# Patient Record
Sex: Female | Born: 1945
Health system: Southern US, Community
[De-identification: ages and names within clinical notes are randomized; demographics above are authoritative.]

## PROBLEM LIST (undated history)

## (undated) DIAGNOSIS — K219 Gastro-esophageal reflux disease without esophagitis: Secondary | ICD-10-CM

## (undated) DIAGNOSIS — M1990 Unspecified osteoarthritis, unspecified site: Secondary | ICD-10-CM

## (undated) DIAGNOSIS — Z96651 Presence of right artificial knee joint: Secondary | ICD-10-CM

## (undated) DIAGNOSIS — T7840XA Allergy, unspecified, initial encounter: Secondary | ICD-10-CM

## (undated) DIAGNOSIS — K759 Inflammatory liver disease, unspecified: Secondary | ICD-10-CM

## (undated) DIAGNOSIS — M8580 Other specified disorders of bone density and structure, unspecified site: Secondary | ICD-10-CM

## (undated) DIAGNOSIS — E559 Vitamin D deficiency, unspecified: Secondary | ICD-10-CM

## (undated) DIAGNOSIS — D499 Neoplasm of unspecified behavior of unspecified site: Secondary | ICD-10-CM

## (undated) DIAGNOSIS — G454 Transient global amnesia: Secondary | ICD-10-CM

## (undated) DIAGNOSIS — I8390 Asymptomatic varicose veins of unspecified lower extremity: Secondary | ICD-10-CM

## (undated) DIAGNOSIS — R413 Other amnesia: Secondary | ICD-10-CM

## (undated) DIAGNOSIS — R112 Nausea with vomiting, unspecified: Secondary | ICD-10-CM

## (undated) DIAGNOSIS — Z9889 Other specified postprocedural states: Secondary | ICD-10-CM

## (undated) DIAGNOSIS — I341 Nonrheumatic mitral (valve) prolapse: Secondary | ICD-10-CM

## (undated) DIAGNOSIS — I82409 Acute embolism and thrombosis of unspecified deep veins of unspecified lower extremity: Secondary | ICD-10-CM

## (undated) DIAGNOSIS — I671 Cerebral aneurysm, nonruptured: Secondary | ICD-10-CM

## (undated) DIAGNOSIS — E785 Hyperlipidemia, unspecified: Secondary | ICD-10-CM

## (undated) HISTORY — DX: Acute embolism and thrombosis of unspecified deep veins of unspecified lower extremity: I82.409

## (undated) HISTORY — DX: Presence of right artificial knee joint: Z96.651

## (undated) HISTORY — DX: Unspecified osteoarthritis, unspecified site: M19.90

## (undated) HISTORY — DX: Inflammatory liver disease, unspecified: K75.9

## (undated) HISTORY — PX: STAPEDECTOMY: SHX2435

## (undated) HISTORY — DX: Neoplasm of unspecified behavior of unspecified site: D49.9

## (undated) HISTORY — DX: Other specified disorders of bone density and structure, unspecified site: M85.80

## (undated) HISTORY — PX: COLONOSCOPY: SHX174

## (undated) HISTORY — DX: Hyperlipidemia, unspecified: E78.5

## (undated) HISTORY — DX: Other amnesia: R41.3

## (undated) HISTORY — PX: DILATION AND CURETTAGE OF UTERUS: SHX78

## (undated) HISTORY — DX: Allergy, unspecified, initial encounter: T78.40XA

## (undated) HISTORY — DX: Gastro-esophageal reflux disease without esophagitis: K21.9

## (undated) HISTORY — DX: Transient global amnesia: G45.4

## (undated) HISTORY — DX: Asymptomatic varicose veins of unspecified lower extremity: I83.90

## (undated) HISTORY — DX: Cerebral aneurysm, nonruptured: I67.1

## (undated) HISTORY — PX: OTHER SURGICAL HISTORY: SHX169

## (undated) HISTORY — PX: APPENDECTOMY: SHX54

## (undated) HISTORY — PX: TUBAL LIGATION: SHX77

## (undated) HISTORY — DX: Vitamin D deficiency, unspecified: E55.9

---
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
## 1997-12-03 ENCOUNTER — Other Ambulatory Visit: Admission: RE | Admit: 1997-12-03 | Discharge: 1997-12-03 | Payer: Self-pay | Admitting: Gynecology

## 1998-10-20 ENCOUNTER — Other Ambulatory Visit: Admission: RE | Admit: 1998-10-20 | Discharge: 1998-10-20 | Payer: Self-pay | Admitting: Gynecology

## 1999-11-03 ENCOUNTER — Other Ambulatory Visit: Admission: RE | Admit: 1999-11-03 | Discharge: 1999-11-03 | Payer: Self-pay | Admitting: Gynecology

## 2000-11-08 ENCOUNTER — Other Ambulatory Visit: Admission: RE | Admit: 2000-11-08 | Discharge: 2000-11-08 | Payer: Self-pay | Admitting: Gynecology

## 2001-11-13 ENCOUNTER — Other Ambulatory Visit: Admission: RE | Admit: 2001-11-13 | Discharge: 2001-11-13 | Payer: Self-pay | Admitting: Gynecology

## 2002-11-15 ENCOUNTER — Other Ambulatory Visit: Admission: RE | Admit: 2002-11-15 | Discharge: 2002-11-15 | Payer: Self-pay | Admitting: Gynecology

## 2003-08-04 ENCOUNTER — Emergency Department (HOSPITAL_COMMUNITY): Admission: EM | Admit: 2003-08-04 | Discharge: 2003-08-04 | Payer: Self-pay | Admitting: Emergency Medicine

## 2003-11-18 ENCOUNTER — Other Ambulatory Visit: Admission: RE | Admit: 2003-11-18 | Discharge: 2003-11-18 | Payer: Self-pay | Admitting: Gynecology

## 2003-12-06 ENCOUNTER — Encounter: Payer: Self-pay | Admitting: Internal Medicine

## 2004-03-12 ENCOUNTER — Ambulatory Visit: Payer: Self-pay | Admitting: Family Medicine

## 2004-07-08 ENCOUNTER — Ambulatory Visit: Payer: Self-pay | Admitting: Internal Medicine

## 2004-07-29 ENCOUNTER — Ambulatory Visit: Payer: Self-pay | Admitting: Internal Medicine

## 2004-08-21 ENCOUNTER — Ambulatory Visit: Payer: Self-pay | Admitting: Internal Medicine

## 2004-10-30 ENCOUNTER — Ambulatory Visit: Payer: Self-pay | Admitting: Internal Medicine

## 2004-11-06 ENCOUNTER — Ambulatory Visit: Payer: Self-pay | Admitting: Cardiology

## 2004-11-19 ENCOUNTER — Other Ambulatory Visit: Admission: RE | Admit: 2004-11-19 | Discharge: 2004-11-19 | Payer: Self-pay | Admitting: Gynecology

## 2004-11-23 ENCOUNTER — Ambulatory Visit: Payer: Self-pay | Admitting: Internal Medicine

## 2005-02-09 ENCOUNTER — Ambulatory Visit: Payer: Self-pay | Admitting: Internal Medicine

## 2005-07-23 ENCOUNTER — Ambulatory Visit: Payer: Self-pay | Admitting: Internal Medicine

## 2005-08-03 ENCOUNTER — Ambulatory Visit: Payer: Self-pay | Admitting: Internal Medicine

## 2005-09-10 ENCOUNTER — Ambulatory Visit: Payer: Self-pay | Admitting: Internal Medicine

## 2005-12-22 ENCOUNTER — Ambulatory Visit: Payer: Self-pay | Admitting: Internal Medicine

## 2006-03-25 ENCOUNTER — Ambulatory Visit: Payer: Self-pay | Admitting: Internal Medicine

## 2006-05-25 ENCOUNTER — Ambulatory Visit: Payer: Self-pay | Admitting: Internal Medicine

## 2006-05-25 LAB — CONVERTED CEMR LAB
ALT: 26 units/L (ref 0–40)
AST: 32 units/L (ref 0–37)
HDL: 62.6 mg/dL (ref >39.0)
Total CHOL/HDL Ratio: 2.7
Triglycerides: 46 mg/dL (ref 0–149)
VLDL: 9 mg/dL (ref 0–40)

## 2006-12-08 ENCOUNTER — Ambulatory Visit: Payer: Self-pay | Admitting: Internal Medicine

## 2006-12-29 ENCOUNTER — Telehealth (INDEPENDENT_AMBULATORY_CARE_PROVIDER_SITE_OTHER): Payer: Self-pay | Admitting: *Deleted

## 2006-12-30 ENCOUNTER — Ambulatory Visit: Payer: Self-pay | Admitting: Internal Medicine

## 2007-01-03 ENCOUNTER — Encounter: Payer: Self-pay | Admitting: Internal Medicine

## 2007-01-20 ENCOUNTER — Ambulatory Visit: Payer: Self-pay | Admitting: Internal Medicine

## 2007-01-23 ENCOUNTER — Telehealth (INDEPENDENT_AMBULATORY_CARE_PROVIDER_SITE_OTHER): Payer: Self-pay | Admitting: *Deleted

## 2007-01-24 ENCOUNTER — Encounter: Payer: Self-pay | Admitting: Internal Medicine

## 2007-01-24 ENCOUNTER — Telehealth (INDEPENDENT_AMBULATORY_CARE_PROVIDER_SITE_OTHER): Payer: Self-pay | Admitting: *Deleted

## 2007-01-25 ENCOUNTER — Encounter: Payer: Self-pay | Admitting: Internal Medicine

## 2007-02-03 ENCOUNTER — Encounter: Payer: Self-pay | Admitting: Internal Medicine

## 2007-02-28 ENCOUNTER — Ambulatory Visit: Payer: Self-pay | Admitting: Internal Medicine

## 2007-02-28 DIAGNOSIS — Z9889 Other specified postprocedural states: Secondary | ICD-10-CM | POA: Insufficient documentation

## 2007-02-28 DIAGNOSIS — Z9189 Other specified personal risk factors, not elsewhere classified: Secondary | ICD-10-CM | POA: Insufficient documentation

## 2007-02-28 DIAGNOSIS — F988 Other specified behavioral and emotional disorders with onset usually occurring in childhood and adolescence: Secondary | ICD-10-CM | POA: Insufficient documentation

## 2007-03-07 ENCOUNTER — Encounter (INDEPENDENT_AMBULATORY_CARE_PROVIDER_SITE_OTHER): Payer: Self-pay | Admitting: *Deleted

## 2007-03-07 LAB — CONVERTED CEMR LAB
AST: 21 units/L (ref 0–37)
Cholesterol: 231 mg/dL (ref 0–200)
Direct LDL: 151.6 mg/dL
Eosinophils Relative: 7.9 % — ABNORMAL HIGH (ref 0.0–5.0)
HDL: 56 mg/dL (ref >39.0)
Lymphocytes Relative: 34.2 % (ref 12.0–46.0)
MCHC: 33.8 g/dL (ref 30.0–36.0)
Monocytes Absolute: 0.3 10*3/uL (ref 0.2–0.7)
Monocytes Relative: 8.2 % (ref 3.0–11.0)
Neutrophils Relative %: 48.7 % (ref 43.0–77.0)
RDW: 12.5 % (ref 11.5–14.6)
TSH: 1.05 microintl units/mL (ref 0.35–5.50)

## 2007-03-10 ENCOUNTER — Encounter (INDEPENDENT_AMBULATORY_CARE_PROVIDER_SITE_OTHER): Payer: Self-pay | Admitting: *Deleted

## 2007-03-27 ENCOUNTER — Telehealth (INDEPENDENT_AMBULATORY_CARE_PROVIDER_SITE_OTHER): Payer: Self-pay | Admitting: *Deleted

## 2007-04-06 ENCOUNTER — Ambulatory Visit: Payer: Self-pay | Admitting: Internal Medicine

## 2007-05-05 ENCOUNTER — Ambulatory Visit: Payer: Self-pay | Admitting: Internal Medicine

## 2007-05-05 ENCOUNTER — Encounter: Payer: Self-pay | Admitting: Internal Medicine

## 2007-05-09 DIAGNOSIS — I499 Cardiac arrhythmia, unspecified: Secondary | ICD-10-CM | POA: Insufficient documentation

## 2007-05-09 DIAGNOSIS — I059 Rheumatic mitral valve disease, unspecified: Secondary | ICD-10-CM | POA: Insufficient documentation

## 2007-05-17 ENCOUNTER — Ambulatory Visit: Payer: Self-pay | Admitting: Internal Medicine

## 2007-05-18 ENCOUNTER — Ambulatory Visit: Payer: Self-pay | Admitting: Internal Medicine

## 2007-05-20 LAB — CONVERTED CEMR LAB
ALT: 22 units/L (ref 0–35)
AST: 25 units/L (ref 0–37)
Cholesterol: 187 mg/dL (ref 0–200)
VLDL: 20 mg/dL (ref 0–40)

## 2007-05-23 ENCOUNTER — Encounter (INDEPENDENT_AMBULATORY_CARE_PROVIDER_SITE_OTHER): Payer: Self-pay | Admitting: *Deleted

## 2007-07-25 ENCOUNTER — Ambulatory Visit: Payer: Self-pay | Admitting: Internal Medicine

## 2007-07-25 DIAGNOSIS — E785 Hyperlipidemia, unspecified: Secondary | ICD-10-CM | POA: Insufficient documentation

## 2007-07-25 LAB — CONVERTED CEMR LAB
Cholesterol, target level: 200 mg/dL
HDL goal, serum: 40 mg/dL
LDL Goal: 160 mg/dL

## 2007-08-10 ENCOUNTER — Encounter: Payer: Self-pay | Admitting: Internal Medicine

## 2007-08-21 ENCOUNTER — Telehealth (INDEPENDENT_AMBULATORY_CARE_PROVIDER_SITE_OTHER): Payer: Self-pay | Admitting: *Deleted

## 2007-09-01 ENCOUNTER — Telehealth (INDEPENDENT_AMBULATORY_CARE_PROVIDER_SITE_OTHER): Payer: Self-pay | Admitting: *Deleted

## 2007-10-09 ENCOUNTER — Ambulatory Visit: Payer: Self-pay | Admitting: Internal Medicine

## 2007-10-18 ENCOUNTER — Encounter: Admission: RE | Admit: 2007-10-18 | Discharge: 2007-11-08 | Payer: Self-pay | Admitting: Internal Medicine

## 2007-10-18 ENCOUNTER — Encounter: Payer: Self-pay | Admitting: Internal Medicine

## 2007-11-08 ENCOUNTER — Encounter: Payer: Self-pay | Admitting: Internal Medicine

## 2007-11-10 ENCOUNTER — Telehealth (INDEPENDENT_AMBULATORY_CARE_PROVIDER_SITE_OTHER): Payer: Self-pay | Admitting: *Deleted

## 2007-12-13 ENCOUNTER — Ambulatory Visit: Payer: Self-pay | Admitting: Internal Medicine

## 2007-12-14 ENCOUNTER — Encounter (INDEPENDENT_AMBULATORY_CARE_PROVIDER_SITE_OTHER): Payer: Self-pay | Admitting: *Deleted

## 2008-01-29 ENCOUNTER — Encounter (INDEPENDENT_AMBULATORY_CARE_PROVIDER_SITE_OTHER): Payer: Self-pay | Admitting: *Deleted

## 2008-01-31 ENCOUNTER — Ambulatory Visit: Payer: Self-pay | Admitting: Internal Medicine

## 2008-02-29 ENCOUNTER — Ambulatory Visit: Payer: Self-pay | Admitting: Internal Medicine

## 2008-02-29 DIAGNOSIS — M810 Age-related osteoporosis without current pathological fracture: Secondary | ICD-10-CM | POA: Insufficient documentation

## 2008-02-29 LAB — CONVERTED CEMR LAB
Cholesterol, target level: 200 mg/dL
HDL goal, serum: 50 mg/dL

## 2008-03-02 LAB — CONVERTED CEMR LAB: Vit D, 1,25-Dihydroxy: 47 (ref 30–89)

## 2008-03-04 ENCOUNTER — Encounter (INDEPENDENT_AMBULATORY_CARE_PROVIDER_SITE_OTHER): Payer: Self-pay | Admitting: *Deleted

## 2008-03-11 ENCOUNTER — Encounter (INDEPENDENT_AMBULATORY_CARE_PROVIDER_SITE_OTHER): Payer: Self-pay | Admitting: *Deleted

## 2008-03-11 ENCOUNTER — Ambulatory Visit: Payer: Self-pay | Admitting: Internal Medicine

## 2008-03-12 ENCOUNTER — Encounter (INDEPENDENT_AMBULATORY_CARE_PROVIDER_SITE_OTHER): Payer: Self-pay | Admitting: *Deleted

## 2008-03-12 LAB — CONVERTED CEMR LAB
ALT: 20 units/L (ref 0–35)
Alkaline Phosphatase: 56 units/L (ref 39–117)
Basophils Relative: 1.2 % (ref 0.0–3.0)
CO2: 28 meq/L (ref 19–32)
Calcium: 9.4 mg/dL (ref 8.4–10.5)
Creatinine, Ser: 1 mg/dL (ref 0.4–1.2)
GFR calc Af Amer: 72 mL/min
GFR calc non Af Amer: 60 mL/min
Glucose, Bld: 88 mg/dL (ref 70–99)
Lymphocytes Relative: 35.9 % (ref 12.0–46.0)
MCHC: 34.8 g/dL (ref 30.0–36.0)
Monocytes Absolute: 0.4 10*3/uL (ref 0.1–1.0)
Neutro Abs: 2.2 10*3/uL (ref 1.4–7.7)
Neutrophils Relative %: 47.6 % (ref 43.0–77.0)
TSH: 1.54 microintl units/mL (ref 0.35–5.50)
Total CHOL/HDL Ratio: 3.3
Total Protein: 7.1 g/dL (ref 6.0–8.3)
Triglycerides: 64 mg/dL (ref 0–149)
VLDL: 13 mg/dL (ref 0–40)

## 2008-03-22 ENCOUNTER — Encounter: Payer: Self-pay | Admitting: Internal Medicine

## 2008-03-25 ENCOUNTER — Encounter: Payer: Self-pay | Admitting: Internal Medicine

## 2008-07-15 ENCOUNTER — Ambulatory Visit: Payer: Self-pay | Admitting: Internal Medicine

## 2008-10-24 ENCOUNTER — Ambulatory Visit: Payer: Self-pay | Admitting: Internal Medicine

## 2008-10-24 DIAGNOSIS — H53469 Homonymous bilateral field defects, unspecified side: Secondary | ICD-10-CM | POA: Insufficient documentation

## 2008-10-24 DIAGNOSIS — I8390 Asymptomatic varicose veins of unspecified lower extremity: Secondary | ICD-10-CM | POA: Insufficient documentation

## 2008-10-24 DIAGNOSIS — M79609 Pain in unspecified limb: Secondary | ICD-10-CM | POA: Insufficient documentation

## 2008-10-25 ENCOUNTER — Ambulatory Visit: Payer: Self-pay

## 2008-10-25 ENCOUNTER — Encounter: Payer: Self-pay | Admitting: Internal Medicine

## 2009-03-05 ENCOUNTER — Ambulatory Visit: Payer: Self-pay | Admitting: Internal Medicine

## 2009-03-05 LAB — CONVERTED CEMR LAB
AST: 21 units/L (ref 0–37)
Albumin: 4.1 g/dL (ref 3.5–5.2)
Alkaline Phosphatase: 59 units/L (ref 39–117)
Basophils Absolute: 0 10*3/uL (ref 0.0–0.1)
Bilirubin, Direct: 0 mg/dL (ref 0.0–0.3)
CO2: 25 meq/L (ref 19–32)
Calcium: 8.9 mg/dL (ref 8.4–10.5)
Eosinophils Relative: 5.2 % — ABNORMAL HIGH (ref 0.0–5.0)
GFR calc non Af Amer: 67.04 mL/min (ref >60)
Glucose, Bld: 82 mg/dL (ref 70–99)
HDL: 63.1 mg/dL (ref >39.00)
LDL Cholesterol: 108 mg/dL — ABNORMAL HIGH (ref 0–99)
Lymphs Abs: 1.7 10*3/uL (ref 0.7–4.0)
Monocytes Relative: 7.6 % (ref 3.0–12.0)
Neutrophils Relative %: 52 % (ref 43.0–77.0)
Platelets: 249 10*3/uL (ref 150.0–400.0)
Potassium: 4.1 meq/L (ref 3.5–5.1)
RDW: 12.4 % (ref 11.5–14.6)
Sodium: 142 meq/L (ref 135–145)
Total CHOL/HDL Ratio: 3
Total Protein: 7.3 g/dL (ref 6.0–8.3)
Triglycerides: 88 mg/dL (ref 0.0–149.0)
VLDL: 17.6 mg/dL (ref 0.0–40.0)
WBC: 5 10*3/uL (ref 4.5–10.5)

## 2009-03-10 ENCOUNTER — Ambulatory Visit: Payer: Self-pay | Admitting: Internal Medicine

## 2009-03-31 ENCOUNTER — Telehealth (INDEPENDENT_AMBULATORY_CARE_PROVIDER_SITE_OTHER): Payer: Self-pay | Admitting: *Deleted

## 2009-07-03 ENCOUNTER — Ambulatory Visit: Payer: Self-pay | Admitting: Internal Medicine

## 2009-10-13 ENCOUNTER — Ambulatory Visit: Payer: Self-pay | Admitting: Internal Medicine

## 2009-10-13 DIAGNOSIS — M2550 Pain in unspecified joint: Secondary | ICD-10-CM | POA: Insufficient documentation

## 2009-10-14 LAB — CONVERTED CEMR LAB: Uric Acid, Serum: 4.3 mg/dL (ref 2.4–7.0)

## 2009-10-16 LAB — CONVERTED CEMR LAB: Rhuematoid fact SerPl-aCnc: <20 intl units/mL (ref 0–20)

## 2010-01-03 ENCOUNTER — Ambulatory Visit: Payer: Self-pay | Admitting: Family Medicine

## 2010-01-03 DIAGNOSIS — S139XXA Sprain of joints and ligaments of unspecified parts of neck, initial encounter: Secondary | ICD-10-CM | POA: Insufficient documentation

## 2010-02-17 ENCOUNTER — Telehealth (INDEPENDENT_AMBULATORY_CARE_PROVIDER_SITE_OTHER): Payer: Self-pay | Admitting: *Deleted

## 2010-03-27 ENCOUNTER — Ambulatory Visit: Payer: Self-pay | Admitting: Internal Medicine

## 2010-03-29 LAB — CONVERTED CEMR LAB
ALT: 19 units/L (ref 0–35)
Basophils Absolute: 0.1 10*3/uL (ref 0.0–0.1)
Basophils Relative: 0.9 % (ref 0.0–3.0)
Chloride: 107 meq/L (ref 96–112)
Eosinophils Relative: 7.2 % — ABNORMAL HIGH (ref 0.0–5.0)
GFR calc non Af Amer: 72.35 mL/min (ref >60)
HCT: 39.9 % (ref 36.0–46.0)
HDL: 64.2 mg/dL (ref >39.00)
Hemoglobin: 13.6 g/dL (ref 12.0–15.0)
Lymphocytes Relative: 28.7 % (ref 12.0–46.0)
Monocytes Relative: 8.4 % (ref 3.0–12.0)
Neutro Abs: 3.2 10*3/uL (ref 1.4–7.7)
Potassium: 4.5 meq/L (ref 3.5–5.1)
RBC: 4.22 M/uL (ref 3.87–5.11)
RDW: 13.1 % (ref 11.5–14.6)
Sodium: 144 meq/L (ref 135–145)
TSH: 1.81 microintl units/mL (ref 0.35–5.50)
Total Protein: 6.8 g/dL (ref 6.0–8.3)
Triglycerides: 57 mg/dL (ref 0.0–149.0)
VLDL: 11.4 mg/dL (ref 0.0–40.0)
WBC: 5.8 10*3/uL (ref 4.5–10.5)

## 2010-04-03 ENCOUNTER — Ambulatory Visit: Payer: Self-pay | Admitting: Internal Medicine

## 2010-04-03 ENCOUNTER — Encounter: Payer: Self-pay | Admitting: Internal Medicine

## 2010-04-03 DIAGNOSIS — M1710 Unilateral primary osteoarthritis, unspecified knee: Secondary | ICD-10-CM

## 2010-04-03 DIAGNOSIS — IMO0002 Reserved for concepts with insufficient information to code with codable children: Secondary | ICD-10-CM | POA: Insufficient documentation

## 2010-04-07 ENCOUNTER — Encounter (INDEPENDENT_AMBULATORY_CARE_PROVIDER_SITE_OTHER): Payer: Self-pay | Admitting: *Deleted

## 2010-04-07 ENCOUNTER — Ambulatory Visit: Payer: Self-pay | Admitting: Internal Medicine

## 2010-04-07 LAB — CONVERTED CEMR LAB: OCCULT 3: NEGATIVE
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
## 2010-05-26 NOTE — Assessment & Plan Note (Signed)
Summary: under chin is swollen//lch   Vital Signs:  Patient profile:   65 year old female Weight:      151.4 pounds Temp:     98.3 degrees F oral Pulse rate:   76 / minute Resp:     14 per minute BP sitting:   116 / 70  (left arm) Cuff size:   large  Vitals Entered By: Shonna Chock (July 03, 2009 12:21 PM) CC: 1.) Swollen area near left ear-? swollen gland   2.) Look @ middle finger on right hand Comments REVIEWED MED LIST, PATIENT AGREED DOSE AND INSTRUCTION CORRECT    CC:  1.) Swollen area near left ear-? swollen gland   2.) Look @ middle finger on right hand.  History of Present Illness: Constant sharp, 4 on 10 scale pain behind L  jaw line  since 07/02/2009. Rx: warm water topically.Swelling L face  > 1 week ago.  Allergies: 1)  ! * Adderall 2)  Codeine 3)  Indocin 4)  Bactrim 5)  Cipro 6)  Augmentin 7)  * Concerta  Review of Systems General:  Denies chills, fever, and sweats. ENT:  Denies decreased hearing, ear discharge, earache, nasal congestion, and sinus pressure; No frontal headache, facial pain or purulence. Allergy:  Complains of itching eyes; denies sneezing.  Physical Exam  General:  well-nourished,in no acute distress; alert,appropriate and cooperative throughout examination Ears:  External ear exam shows no significant lesions or deformities.  Otoscopic examination reveals clear canals, tympanic membranes are intact bilaterally without bulging, retraction, inflammation or discharge. Hearing is grossly normal bilaterally.No mastoid tenderness Nose:  External nasal examination shows no deformity or inflammation. Nasal mucosa are pink and moist without lesions or exudates. Mouth:  Oral mucosa and oropharynx without lesions or exudates.  Teeth in good repair.Osteoma hard palate. Crepitus L TMJ. Minimal pharyngeal erythema.  No parotid tenderness Neck:  Slight tenderness L submandibular area w/o distinct  LA  Skin:  Intact without suspicious lesions or  rashes Cervical Nodes:  No lymphadenopathy noted(See Neck) Axillary Nodes:  No palpable lymphadenopathy   Impression & Recommendations:  Problem # 1:  NECK PAIN, LEFT (ICD-723.1) L submandibular area; R/O early lymphadenitis The following medications were removed from the medication list:    Flexeril 5 Mg Tabs (Cyclobenzaprine hcl) .Marland Kitchen... 1 by mouth at bedtime as needed  Complete Medication List: 1)  Fosamax 70 Mg Tabs (Alendronate sodium) .Marland Kitchen.. 1 by mouth qweek 2)  Multivitamin  3)  Baby Asa  4)  Fish Oil  5)  Osteobiflex  .... Takes two daily 6)  Pravachol 40 Mg Tabs (Pravastatin sodium) .... 1/2 qhs 7)  Astelin 137 Mcg/spray Soln (Azelastine hcl) .Marland Kitchen.. 1-4 sprays q 12 hours 8)  Nasonex 50 Mcg/act Susp (Mometasone furoate) .... At bedtime 9)  Grape Seed  .Marland Kitchen.. 1 by mouth once daily 10)  Calcium Plus Vit D  .... 1 by mouth once daily 11)  Calcium/magnesium/zinc  .... 3 by mouth once daily 12)  Vitamin B-12 250 Mcg Tabs (Cyanocobalamin) .Marland Kitchen.. 1 by mouth once daily 13)  Cephalexin 500 Mg Caps (Cephalexin) .Marland Kitchen.. 1 two times a day  Patient Instructions: 1)  Monitor for signs of infection as discussed . Moist warm compresses 4X/ ady as needed .ASA as needed for painor fever. Prescriptions: CEPHALEXIN 500 MG CAPS (CEPHALEXIN) 1 two times a day  #14 x 0   Entered and Authorized by:   Marga Melnick MD   Signed by:   Marga Melnick MD on 07/03/2009  Method used:   Print then Give to Patient   RxID:   [id number]

## 2010-05-26 NOTE — Progress Notes (Signed)
Summary: orders for labs  Phone Note Call from Patient   Summary of Call: Made an appt for pt to have lab work done on  12-2 need orders to place on that day. Cpx is set up for 12-9 . Initial call taken by: Freddy Jaksch,  February 17, 2010 11:52 AM  Follow-up for Phone Call        272.4/995.20/v70.0  Lipid/Hep/BMP/TSH/CBCD/Stool Cards Follow-up by: Shonna Chock CMA,  February 17, 2010 1:22 PM  Additional Follow-up for Phone Call Additional follow up Details #1::        PLACE ON PT APPT. Additional Follow-up by: Freddy Jaksch,  February 18, 2010 9:53 AM

## 2010-05-26 NOTE — Assessment & Plan Note (Signed)
Summary: possible gout//lch   Vital Signs:  Patient profile:   65 year old female Weight:      150.6 pounds Temp:     98.7 degrees F oral Pulse rate:   76 / minute Resp:     17 per minute BP sitting:   118 / 68  (left arm) Cuff size:   large  Vitals Entered By: Shonna Chock (October 13, 2009 10:26 AM) CC: 1.) Pain and swelling in left foot/ right hand off/on x 3 months  2.) Discuss Pravachol/ Order for fasting labs (Near Future)  3.) Discuss Fosamax (Patient stopped x 2 weeks), Lower Extremity Joint pain Comments REVIEWED MED LIST, PATIENT AGREED DOSE AND INSTRUCTION CORRECT    CC:  1.) Pain and swelling in left foot/ right hand off/on x 3 months  2.) Discuss Pravachol/ Order for fasting labs (Near Future)  3.) Discuss Fosamax (Patient stopped x 2 weeks) and Lower Extremity Joint pain.  History of Present Illness:  Lower Extremity Pain      This is a 65 year old woman who presents with Lower Extremity  pain starting 6 weeks ago.  The patient denies swelling, redness, giving away, locking, popping, stiffness for >1 hr, decreased ROM, and weakness.  The pain is located in the LLE from the  knee to the ankle , "in the shin".  The pain began suddenly and with no injury.  The pain is described as sharp, constant, and occuring at rest, chiefly @ night. There has been no evaluation to date  The patient denies the following symptoms: fever, rash, photosensitivity, eye symptoms, diarrhea, and dysuria.  The LLE pain resolves after stopping Fosamax 2 weeks.  Rx: ASA helped her sleep. Now she has pain in L 2nd toe intermittently  & 4th R finger with am stiffness & swelling. Also intermittent R shoulder pain . Rx: ASA helps some. No FH of arthritis. Meds reviewed ; she is not on HCTZ. Her mother had pancreatic cancer ; a friend with pancreatic cancer has joint pain.  Allergies: 1)  ! * Adderall 2)  Codeine 3)  Indocin 4)  Bactrim 5)  Cipro 6)  Augmentin 7)  * Concerta  Review of Systems General:   Denies chills, fever, sweats, and weight loss. GI:  Denies abdominal pain, bloody stools, dark tarry stools, and indigestion. GU:  Denies discharge and hematuria. MS:  Denies joint redness, low back pain, mid back pain, muscle weakness, and thoracic pain. Derm:  Denies lesion(s) and rash. Neuro:  Denies brief paralysis, numbness, tingling, and weakness.  Physical Exam  General:  well-nourished,in no acute distress; alert,appropriate and cooperative throughout examination Eyes:  No corneal or conjunctival inflammation noted. Perrla.No icterus Abdomen:  Bowel sounds positive,abdomen soft and non-tender without masses, organomegaly or hernias noted. Pulses:  R and L radial,dorsalis pedis and posterior tibial pulses are full and equal bilaterally Extremities:  No clubbing, cyanosis, edema. Minor DIP changes  noted with normal full range of motion of all joints. Mild crepitus of knees. Varicose veins L ankle . Corn L 2nd toe medially. No skhin tenderness Neurologic:  alert & oriented X3, strength normal in all extremities, and DTRs symmetrical and normal.   Skin:  Intact without suspicious lesions or rashes. No jaundice Cervical Nodes:  No lymphadenopathy noted Axillary Nodes:  No palpable lymphadenopathy Psych:  memory intact for recent and remote, normally interactive, and good eye contact.     Impression & Recommendations:  Problem # 1:  ARTHRALGIA (ICD-719.40)  Multiple  joints  Orders: Venipuncture (64332) TLB-Rheumatoid Factor (RA) (95188-CZ) TLB-Sedimentation Rate (ESR) (85652-ESR) TLB-Uric Acid, Blood (84550-URIC) T-Finger(s) (73140TC) T-Toe(s) (73660TC)  Complete Medication List: 1)  Multivitamin  2)  Baby Asa  3)  Fish Oil  4)  Osteobiflex  .... Takes two daily 5)  Pravachol 40 Mg Tabs (Pravastatin sodium) .... 1/2 qhs 6)  Astelin 137 Mcg/spray Soln (Azelastine hcl) .Marland Kitchen.. 1-4 sprays q 12 hours 7)  Grape Seed  .Marland Kitchen.. 1 by mouth once daily 8)  Calcium Plus Vit D  .... 1 by  mouth once daily 9)  Calcium/magnesium/zinc  .... 3 by mouth once daily 10)  Vitamin B-12 250 Mcg Tabs (Cyanocobalamin) .Marland Kitchen.. 1 by mouth once daily 11)  Biotin 300 Mcg Tabs (Biotin) .Marland Kitchen.. 1 by mouth once daily  Patient Instructions: 1)  Arthritis Strength Tylenol at bedtime as needed for joint pain; ASA needs to be taken with food.
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
## 2010-05-28 NOTE — Letter (Signed)
Summary: Results Follow up Letter   at [place]  [address] [place], [place] [address]   Phone: [phone]  Fax: [phone]    04/07/2010 MRN: [id number]  Kim Mclaughlin [address] [place], [place]  [address]  Dear Kim Mclaughlin,  The following are the results of your recent test(s):  Test         Result    Pap Smear:        Normal _____  Not Normal _____ Comments: ______________________________________________________ Cholesterol: LDL(Bad cholesterol):         Your goal is less than:         HDL (Good cholesterol):       Your goal is more than: Comments:  ______________________________________________________ Mammogram:        Normal _____  Not Normal _____ Comments:  ___________________________________________________________________ Hemoccult:        Normal __X___  Not normal _______ Comments:    _____________________________________________________________________ Other Tests:    We routinely do not discuss normal results over the telephone.  If you desire a copy of the results, or you have any questions about this information we can discuss them at your next office visit.   Sincerely,

## 2010-07-10 ENCOUNTER — Encounter: Payer: Self-pay | Admitting: Internal Medicine

## 2010-07-10 ENCOUNTER — Ambulatory Visit (INDEPENDENT_AMBULATORY_CARE_PROVIDER_SITE_OTHER): Payer: Medicare Other | Admitting: Internal Medicine

## 2010-07-10 ENCOUNTER — Other Ambulatory Visit: Payer: Self-pay | Admitting: Internal Medicine

## 2010-07-10 DIAGNOSIS — R42 Dizziness and giddiness: Secondary | ICD-10-CM | POA: Insufficient documentation

## 2010-07-10 LAB — BASIC METABOLIC PANEL
BUN: 14 mg/dL (ref 6–23)
CO2: 27 mEq/L (ref 19–32)
Calcium: 10 mg/dL (ref 8.4–10.5)
Chloride: 103 mEq/L (ref 96–112)
Creatinine, Ser: 0.9 mg/dL (ref 0.4–1.2)
Glucose, Bld: 108 mg/dL — ABNORMAL HIGH (ref 70–99)

## 2010-07-10 LAB — CBC WITH DIFFERENTIAL/PLATELET
Basophils Absolute: 0 10*3/uL (ref 0.0–0.1)
Basophils Relative: 0.6 % (ref 0.0–3.0)
Eosinophils Absolute: 0.1 10*3/uL (ref 0.0–0.7)
MCHC: 34.2 g/dL (ref 30.0–36.0)
MCV: 93.1 fl (ref 78.0–100.0)
Monocytes Absolute: 0.4 10*3/uL (ref 0.1–1.0)
Neutrophils Relative %: 61.9 % (ref 43.0–77.0)
Platelets: 321 10*3/uL (ref 150.0–400.0)
RDW: 13.5 % (ref 11.5–14.6)

## 2010-07-14 NOTE — Assessment & Plan Note (Signed)
Summary: Vertigo/cdj   Vital Signs:  Patient profile:   65 year old female Height:      63.25 inches Weight:      151 pounds O2 Sat:      99 % on Room air Pulse rate:   84 / minute Pulse (ortho):   84 / minute BP standing:   110 / 80  Vitals Entered By: Doristine Devoid CMA (July 10, 2010 12:15 PM)  O2 Flow:  Room air CC: Dizziness this morning has had vertigo in the past , Syncope   Serial Vital Signs/Assessments:  Time      Position  BP       Pulse  Resp  Temp     By           Lying RA  110/74   70                    Chemira Jones CMA           Sitting   112/76   74                    Chemira Jones CMA           Standing  110/80   84                    Chemira Jones CMA   CC:  Dizziness this morning has had vertigo in the past  and Syncope.  History of Present Illness:    Onset upon arising from bed @ 6 am ; "I bounced from wall to wall".No BPV symptoms . PMH of same 28. She  reports lightheadedness, but denies loss of consciousness, premonitory symptoms, palpitations, chest pain, and incontinence.  Associated symptoms include nausea and  diaphoresis, "cold sweat".  The patient denies the following symptoms: headache, abdominal discomfort, feeling warm, pallor, focal weakness, and blurred vision.  The patient reports the following precipitating factors: change in position , specifically  standing upright.    Current Medications (verified): 1)  Multivitamin 2)  Baby Asa 3)  Fish Oil 4)  Osteobiflex .... Takes Two Daily 5)  Astelin 137 Mcg/spray  Soln (Azelastine Hcl) .Marland Kitchen.. 1-4 Sprays Q 12 Hours 6)  Calcium Plus Vit D .... 1 By Mouth Once Daily 7)  Skelaxin 800 Mg Tabs (Metaxalone) .... Take 1 Tablet By Mouth Three Times A Day As Needed For Muscle Spasm. 8)  Biotin .Marland Kitchen.. 1 By Mouth Two Times A Day 9)  Vanos 0.1 % Crea (Fluocinonide) .... Apply As Directed Two Times A Day As Needed 10)  Tramadol Hcl 50 Mg Tabs (Tramadol Hcl) .... 1/2 - 1 Every 6 Hrs As Needed For  Pain  Allergies (verified): 1)  ! * Adderall 2)  ! Pravastatin Sodium (Pravastatin Sodium) 3)  Codeine 4)  Indocin 5)  Bactrim 6)  Cipro 7)  Augmentin 8)  * Concerta  Review of Systems General:  Denies chills and fever. Eyes:  Denies double vision and vision loss-both eyes. ENT:  Denies decreased hearing and ringing in ears. Neuro:  Denies sensation of room spinning.  Physical Exam  General:  well-nourished,in no acute distress; alert,appropriate and cooperative throughout examination Eyes:  No corneal or conjunctival inflammation noted. EOMI. Perrla. Field of  Vision grossly normal. Ears:  External ear exam shows no significant lesions or deformities.  Otoscopic examination reveals clear canals, tympanic membranes are intact bilaterally without bulging, retraction, inflammation or discharge. Hearing is grossly  normal bilaterally. Nose:  External nasal examination shows no deformity or inflammation. Nasal mucosa are pink and moist without lesions or exudates. Slight septal deviation Mouth:  Oral mucosa and oropharynx without lesions or exudates.  Tongue not deviated Lungs:  Normal respiratory effort, chest expands symmetrically. Lungs are clear to auscultation, no crackles or wheezes. Heart:  Normal rate and regular rhythm. S1 and S2 normal without gallop, murmur, click, rub.S4 Pulses:  R and L carotid,radial  pulses are full and equal bilaterally Extremities:  No clubbing, cyanosis, edema. No tremor  Neurologic:  alert & oriented X3, cranial nerves II-XII intact, strength normal in all extremities, sensation intact to light touch, gait normal, DTRs symmetrical and normal, finger-to-nose normal, heel-to-shin normal, and Romberg negative.   Skin:  Intact without suspicious lesions or rashes; slightly damp Cervical Nodes:  No lymphadenopathy noted Axillary Nodes:  No palpable lymphadenopathy Psych:  memory intact for recent and remote, normally interactive, good eye contact, and not  anxious appearing.     Impression & Recommendations:  Problem # 1:  DIZZINESS (ICD-780.4)  w/o vertigo ; no BPV  Orders: TLB-BMP (Basic Metabolic Panel-BMET) (80048-METABOL) TLB-CBC Platelet - w/Differential (85025-CBCD) Prescription Created Electronically [phone])  Her updated medication list for this problem includes:    Meclizine Hcl 25 Mg Tabs (Meclizine hcl) .Marland Kitchen... 1/2 -1 pill every 6 -8 hrs as needed  Complete Medication List: 1)  Multivitamin  2)  Baby Asa  3)  Fish Oil  4)  Osteobiflex  .... Takes two daily 5)  Astelin 137 Mcg/spray Soln (Azelastine hcl) .Marland Kitchen.. 1-4 sprays q 12 hours 6)  Calcium Plus Vit D  .... 1 by mouth once daily 7)  Skelaxin 800 Mg Tabs (Metaxalone) .... Take 1 tablet by mouth three times a day as needed for muscle spasm. 8)  Biotin  .Marland Kitchen.. 1 by mouth two times a day 9)  Vanos 0.1 % Crea (Fluocinonide) .... Apply as directed two times a day as needed 10)  Tramadol Hcl 50 Mg Tabs (Tramadol hcl) .... 1/2 - 1 every 6 hrs as needed for pain 11)  Meclizine Hcl 25 Mg Tabs (Meclizine hcl) .... 1/2 -1 pill every 6 -8 hrs as needed  Patient Instructions: 1)  Isometric  exercises before standing as discussed. Prescriptions: MECLIZINE HCL 25 MG TABS (MECLIZINE HCL) 1/2 -1 pill every 6 -8 hrs as needed  #20 x 0   Entered and Authorized by:   Marga Melnick MD   Signed by:   Marga Melnick MD on 07/10/2010   Method used:   Electronically to        [facility] [phone]* (retail)       [address]       [place], [place]  [address]       Ph: [phone]       Fax: [phone]   RxID:   [id number]    Orders Added: 1)  Est. Patient Level III [32951] 2)  TLB-BMP (Basic Metabolic Panel-BMET) [80048-METABOL] 3)  TLB-CBC Platelet - w/Differential [85025-CBCD] 4)  Prescription Created Electronically [phone]

## 2010-08-12 ENCOUNTER — Telehealth: Payer: Self-pay | Admitting: Internal Medicine

## 2010-08-12 NOTE — Telephone Encounter (Signed)
Patient fell yesterday around lunch and landed on her left wrist---doesn't think it is broken, but would like someone to look at it---first available appt is Dr Alwyn Ren on  Friday    Please call her to advise

## 2010-08-12 NOTE — Telephone Encounter (Signed)
Left msg for return call

## 2010-09-08 NOTE — Assessment & Plan Note (Signed)
[facility]                         GASTROENTEROLOGY OFFICE NOTE   Kim Mclaughlin, Kim Mclaughlin                   MRN:          [id number]  DATE:04/06/2007                            DOB:          March 06, 1946    Kim Mclaughlin is a very nice 65 year old white female who has strong  family history of cancer.  In her family, her father had colon cancer  and died at age of 59, his mother also had colon cancer, paternal aunt  had lung cancer, paternal sister had breast cancer and her paternal  aunt's sister had colon cancer as well, her maternal grandfather had  prostate cancer and sister had ovarian cancer.  We did a colonoscopy on  Kim Mclaughlin in July in 1995 and 1991 which were normal and the last  one in July 2003, which again showed a normal exam.  She is due for  repeat colonoscopy.  She has occasional constipation.  She denies  abdominal pain or rectal bleeding.   MEDICATIONS:  1. Multiple vitamins.  2. Osteo-BiFlex.  3. Fish oil.  4. Aspirin 81 mg p.o. daily.  5. Pravastatin.  6. Calcium supplements.   PAST HISTORY:  1. Heart arrhythmia.  2. Mitral valve prolapse.   OPERATIONS:  Appendectomy in 1971.   FAMILY HISTORY:  As mentioned in the history of present illness.   SOCIAL HISTORY:  Married with 3 children.  She is retired Chiropractor.  She does not smoke and does not drink alcohol.   REVIEW OF SYSTEMS:  Positive for eyeglasses, allergies, heart rhythm  problems, new headaches.   PHYSICAL EXAMINATION:  Blood pressure 114/60, pulse 80 and weight 146.6  pounds.  She was alert, oriented and in no distress.  LUNGS:  Clear to auscultation.  COR:  With normal S1, normal S2.  ABDOMEN:  Soft, mildly protuberant with normoactive bowel sounds, no  tenderness, liver edge at costal margin.  RECTAL:  With somewhat hard but Hemoccult negative stool.  EXTREMITIES:  A 65 year old white female with strong family history of  colon  cancer and occasional constipation.  She is due for recall  colonoscopy.   PLAN:  1. Increase fiber to high fiber diet.  2. Add additional fiber such as Benefiber 1 tablespoon daily.  3. Colonoscopy scheduled using routine colonoscopy prep.     Kim Mclaughlin. Juanda Chance, MD  Electronically Signed    DMB/MedQ  DD: 04/06/2007  DT: 04/07/2007  Job #: [id number]   cc:   Titus Dubin. Alwyn Ren, MD,FACP,FCCP

## 2010-12-22 ENCOUNTER — Other Ambulatory Visit: Payer: Self-pay | Admitting: Gynecology

## 2010-12-22 ENCOUNTER — Other Ambulatory Visit: Payer: Self-pay | Admitting: *Deleted

## 2010-12-22 DIAGNOSIS — R234 Changes in skin texture: Secondary | ICD-10-CM

## 2010-12-22 DIAGNOSIS — R928 Other abnormal and inconclusive findings on diagnostic imaging of breast: Secondary | ICD-10-CM

## 2010-12-22 NOTE — Telephone Encounter (Signed)
OK X1 

## 2010-12-22 NOTE — Telephone Encounter (Signed)
Pt called would like rx written for Voltaren had med prescribed on 10/2007 for knee pain. [facility]

## 2010-12-23 MED ORDER — DICLOFENAC SODIUM 1 % TD GEL: 1.0000 "application " | Freq: Two times a day (BID) | TRANSDERMAL | Status: DC | PRN

## 2010-12-25 ENCOUNTER — Other Ambulatory Visit: Payer: Self-pay | Admitting: Gynecology

## 2010-12-25 ENCOUNTER — Ambulatory Visit
Admission: RE | Admit: 2010-12-25 | Discharge: 2010-12-25 | Disposition: A | Discharge status: Home / Self Care | Payer: Medicare Other | Source: Ambulatory Visit | Attending: Gynecology | Admitting: Gynecology

## 2010-12-25 DIAGNOSIS — R234 Changes in skin texture: Secondary | ICD-10-CM

## 2010-12-25 DIAGNOSIS — R928 Other abnormal and inconclusive findings on diagnostic imaging of breast: Secondary | ICD-10-CM

## 2011-01-28 ENCOUNTER — Telehealth: Payer: Self-pay | Admitting: Internal Medicine

## 2011-01-28 MED ORDER — TRAMADOL HCL 50 MG PO TABS: ORAL_TABLET | ORAL | Status: DC

## 2011-01-28 NOTE — Telephone Encounter (Signed)
RX sent

## 2011-01-28 NOTE — Telephone Encounter (Signed)
[facility] [address]

## 2011-04-28 ENCOUNTER — Telehealth: Payer: Self-pay

## 2011-04-28 NOTE — Telephone Encounter (Signed)
Call from patient stating she was sick and she wanted Tamiflu. Discussed with patient and she stated she has had a low grade temp, chills, cough and headache. She asked could he get something called in to the pharmacy. I advised we normally do call in Rx without patient being seen. The doctor has to review symptoms to make a diagnosis. She voiced understanding.Marland KitchenMarland KitchenApt scheduled with [facility] tomorrow    KP

## 2011-04-29 ENCOUNTER — Ambulatory Visit (INDEPENDENT_AMBULATORY_CARE_PROVIDER_SITE_OTHER): Payer: Medicare Other | Admitting: Internal Medicine

## 2011-04-29 VITALS — BP 118/78 | HR 79 | Temp 100.8°F | Ht 63.25 in | Wt 148.4 lb

## 2011-04-29 DIAGNOSIS — R110 Nausea: Secondary | ICD-10-CM

## 2011-04-29 DIAGNOSIS — J209 Acute bronchitis, unspecified: Secondary | ICD-10-CM

## 2011-04-29 DIAGNOSIS — Z8711 Personal history of peptic ulcer disease: Secondary | ICD-10-CM

## 2011-04-29 DIAGNOSIS — Z8719 Personal history of other diseases of the digestive system: Secondary | ICD-10-CM

## 2011-04-29 MED ORDER — AZITHROMYCIN 250 MG PO TABS
ORAL_TABLET | ORAL | Status: AC
Start: 2011-04-29 — End: 2011-05-04

## 2011-04-29 MED ORDER — RANITIDINE HCL 150 MG PO CAPS: 150.0000 mg | ORAL_CAPSULE | Freq: Two times a day (BID) | ORAL | Status: DC

## 2011-04-29 MED ORDER — BENZONATATE 200 MG PO CAPS: 200.0000 mg | ORAL_CAPSULE | Freq: Three times a day (TID) | ORAL | Status: DC | PRN

## 2011-04-29 NOTE — Progress Notes (Signed)
  Subjective:    Patient ID: Kim Mclaughlin, female    DOB: 05-14-1945, 66 y.o.   MRN: [id number]  HPI Respiratory tract infection Onset/symptoms:04/27/11 as chills  Exposures (illness/environmental/extrinsic):no Progression of symptoms:to deep cough Treatments/response:ASA, Robitussin DM w/o benefit Present symptoms: Fever/chills/sweats:low grade fever Frontal headache:no Facial pain:no Nasal purulence:no Sore throat:minor Dental pain:no Lymphadenopathy:no Wheezing/shortness of breath:no Cough/sputum/hemoptysis:NP Associated extrinsic/allergic symptoms:itchy eyes/ sneezing:no Past medical history: Seasonal allergies; yes/asthma:no Smoking history:in HS No Flu shot in 2012           Review of Systems with the stresses for the holiday she  had some epigastric pain which responded to Mylanta. HEENT she has a past history of ulcers. She has not had melena or  rectal bleeding.     Objective:   Physical Exam General appearance:good health ;well nourished; no acute distress or increased work of breathing is present.  No  lymphadenopathy about the head, neck, or axilla noted.   Eyes: No conjunctival inflammation or lid edema is present. Vision normal with lenses; EOMI . No scleral icterus  Ears:  External ear exam shows no significant lesions or deformities.  Otoscopic examination reveals clear canals, tympanic membranes are intact bilaterally without bulging, retraction, inflammation or discharge.  Nose:  External nasal examination shows no deformity or inflammation. Nasal mucosa are pink and moist without lesions or exudates. No septal dislocation or deviation.No obstruction to airflow.   Oral exam: Dental hygiene is good; lips and gums are healthy appearing.There is no oropharyngeal erythema or exudate noted.   Neck:  No deformities,  masses, or tenderness noted.   Supple with full range of motion without pain.   Heart:  Normal rate and regular rhythm. S1 and S2 normal  without gallop, murmur, click, rub or other extra sounds.   Lungs:Chest clear to auscultation; no wheezes, rhonchi,rales ,or rubs present.No increased work of breathing.   Bowel sounds are normal. Abdomen is soft and nontender with no organomegaly, hernias  or masses.   Extremities:  No cyanosis, edema, or clubbing  noted    Skin: Warm & dry           Assessment & Plan:  #1 bronchitis, acute. No symptoms to suggest rhinosinusitis.  #2 epigastric discomfort which responded to Mylanta. Probable significant reflux Plan: See orders and recommendations.

## 2011-04-29 NOTE — Patient Instructions (Signed)
The triggers for dyspepsia or "heart burn"  include stress; the "aspirin family" ; alcohol; peppermint; and caffeine (coffee, tea, cola, and chocolate). The aspirin family would include aspirin and the nonsteroidal agents such as ibuprofen &  Naproxen. Tylenol would not cause reflux. If having dyspepsia ; food & drink should be avoided for @ least 2 hours before going to bed.  

## 2011-05-05 ENCOUNTER — Telehealth: Payer: Self-pay | Admitting: *Deleted

## 2011-05-05 DIAGNOSIS — J209 Acute bronchitis, unspecified: Secondary | ICD-10-CM

## 2011-05-05 MED ORDER — BENZONATATE 200 MG PO CAPS: 200.0000 mg | ORAL_CAPSULE | Freq: Three times a day (TID) | ORAL | Status: AC | PRN

## 2011-05-05 NOTE — Telephone Encounter (Signed)
Spoke with patient, patient aware rx sent in. Patient states she needs to get well soon b/c her husband has an upcoming surgery and she will have to care for him. Patient would like to go forward with chest xray and schedule appointment for Friday

## 2011-05-05 NOTE — Telephone Encounter (Signed)
Pt reports she was seen in office last Thurs 01.03.12 and prescribed Zpak & cough medication; c/o still having back pain, request to know if she can get renewal on meds or need OV.? Please advise.

## 2011-05-05 NOTE — Telephone Encounter (Signed)
Renew Tessalon Perls. She should have an x-ray and be seen if she continues to have fever or purulent secretions.

## 2011-05-06 ENCOUNTER — Ambulatory Visit (INDEPENDENT_AMBULATORY_CARE_PROVIDER_SITE_OTHER)
Admission: RE | Admit: 2011-05-06 | Discharge: 2011-05-06 | Disposition: A | Discharge status: Home / Self Care | Payer: Medicare Other | Source: Ambulatory Visit | Attending: Internal Medicine | Admitting: Internal Medicine

## 2011-05-06 DIAGNOSIS — J209 Acute bronchitis, unspecified: Secondary | ICD-10-CM

## 2011-05-07 ENCOUNTER — Ambulatory Visit (INDEPENDENT_AMBULATORY_CARE_PROVIDER_SITE_OTHER): Payer: Medicare Other | Admitting: Internal Medicine

## 2011-05-07 VITALS — BP 118/76 | HR 111 | Temp 97.7°F | Ht 63.5 in | Wt 146.0 lb

## 2011-05-07 DIAGNOSIS — J40 Bronchitis, not specified as acute or chronic: Secondary | ICD-10-CM

## 2011-05-07 NOTE — Progress Notes (Signed)
  Subjective:    Patient ID: Kim Mclaughlin, female    DOB: Feb 06, 1946, 66 y.o.   MRN: [id number]  HPI Acute visit Patient of Dr. Alwyn Ren with history of hyperlipidemia, osteopenia was seen recently with URI symptoms and epigastric pain. Was prescribed a Z-Pak and Tessalon Perles. Developed diarrhea one time which resolved. She also developed mid- thoracic pain for which an x-ray was done, x-ray was negative. She is here for followup, overall feels better but not completely well, still having some cough    Review of Systems No fever or chills No nausea or vomiting No shortness of breath or lower extremity edema.    Objective:   Physical Exam  Constitutional: She appears well-developed and well-nourished. No distress.  HENT:  Head: Normocephalic and atraumatic.       TMs slightly bulged but not read, face symmetric, nontender to palpation. Nose not congested, throat normal.   Cardiovascular: Normal rate, regular rhythm and normal heart sounds.   No murmur heard. Pulmonary/Chest: Effort normal and breath sounds normal. No respiratory distress. She has no wheezes.  Musculoskeletal:       Not tender to palpation at the thoracic spine or lumbar spine  Skin: She is not diaphoretic.       Assessment & Plan:  Bronchitis: patient is improving, back pain likely musculoskeletal and related to cough, x-ray negative.  I think she will improve with more time, see instructions. If the back pain continue, we may need to order thoracic spine x-rays on rib x-rays

## 2011-05-07 NOTE — Patient Instructions (Signed)
Continue with tessalon perls as needed for cough mucinex DM is also a great medicine  Call if not back to normal in 10 days

## 2011-05-08 ENCOUNTER — Encounter: Payer: Self-pay | Admitting: Internal Medicine

## 2011-06-04 ENCOUNTER — Ambulatory Visit (INDEPENDENT_AMBULATORY_CARE_PROVIDER_SITE_OTHER): Payer: Medicare Other | Admitting: Internal Medicine

## 2011-06-04 ENCOUNTER — Encounter: Payer: Self-pay | Admitting: Internal Medicine

## 2011-06-04 VITALS — BP 98/62 | HR 73 | Temp 98.6°F | Resp 12 | Ht 63.5 in | Wt 148.2 lb

## 2011-06-04 DIAGNOSIS — E785 Hyperlipidemia, unspecified: Secondary | ICD-10-CM

## 2011-06-04 DIAGNOSIS — Z0000 Encounter for general adult medical examination without abnormal findings: Secondary | ICD-10-CM

## 2011-06-04 DIAGNOSIS — M949 Disorder of cartilage, unspecified: Secondary | ICD-10-CM

## 2011-06-04 DIAGNOSIS — M899 Disorder of bone, unspecified: Secondary | ICD-10-CM

## 2011-06-04 NOTE — Assessment & Plan Note (Signed)
She was previously on cholesterol medicine but stopped this wishing to try  therapeutic life changes. Her last LDL was 161 on 03/27/10. Advanced cholesterol testing will be performed to assess risk and options

## 2011-06-04 NOTE — Progress Notes (Signed)
Subjective:    Patient ID: Kim Mclaughlin, female    DOB: 1945-06-17, 66 y.o.   MRN: [id number]  HPI Medicare Wellness Visit:  The following psychosocial & medical history were reviewed as required by Medicare.   Social history: caffeine:2.5-3 cups / day , alcohol:  no ,  tobacco use : quit 1965  & exercise : walking < 30 min 1X/ week.   Home & personal  safety / fall risk:no issues, activities of daily living: no limitations , seatbelt use : yes , and smoke alarm employment : yes.  Power of Attorney/Living Will status : in place  Vision ( as recorded per Nurse) & Hearing  evaluation :  See exam. Orientation :oriented X 3 , memory & recall :good, math testing: good,and mood & affect : normal . Depression / anxiety: denied Travel history :never , immunization status :multiple shots needed , transfusion history: no, and preventive health surveillance ( colonoscopies, BMD , etc as per protocol/ [facility]): colonoscopy up to date, Dental care: seen every 6 mos . Chart reviewed &  Updated. Active issues reviewed & addressed.       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 She denies any symptoms of rhinosinusitis, purulent sputum, fever, chills, or sweats. She is not on an ACE inhibitor. She has no history of asthma or reactive airways disease.  She denies any symptoms of reflux such as dysphagia, dyspepsia, abdominal pain, rectal bleeding or melena     Objective:   Physical Exam Gen.: Healthy and well-nourished in appearance. Alert, appropriate and cooperative throughout exam. Head: Normocephalic without obvious abnormalities Eyes: No corneal or conjunctival inflammation noted. Pupils equal round reactive to light and accommodation.  Extraocular motion intact. Vision grossly normal with lenses. Ears: External  ear exam reveals no significant lesions or deformities. Canals clear .TMs normal. Hearing is grossly  normal bilaterally. Nose: External nasal exam reveals no deformity or inflammation. Nasal mucosa are pink and moist. No lesions or exudates noted.  Mouth: Oral mucosa and oropharynx reveal no lesions or exudates. Teeth in good repair. Neck: No deformities, masses, or tenderness noted. Range of motion & Thyroid normal Lungs: Normal respiratory effort; chest expands symmetrically. Lungs are clear to auscultation without rales, wheezes, or increased work of breathing. Heart: Normal rate and rhythm. Normal S1 and S2. No gallop, click, or rub. S4 with slurring at  LSB ;no murmur. Abdomen: Bowel sounds normal; abdomen soft and nontender. No masses, organomegaly or hernias noted. Genitalia: Dr Greta Doom .                                                                                   Musculoskeletal/extremities: No deformity or scoliosis noted of  the thoracic or lumbar spine. No clubbing, cyanosis, edema, or deformity noted. Range of motion  normal .Tone & strength  normal.Joints normal. Nail health  good. Vascular: Carotid, radial artery, dorsalis pedis and  posterior tibial pulses are full and equal. No bruits present. Neurologic: Alert and oriented x3. Deep tendon reflexes symmetrical and normal.          Skin: Intact without suspicious lesions or  rashes. Lymph: No cervical, axillary  lymphadenopathy present. Psych: Mood and affect are normal. Normally interactive                                                                                         Assessment & Plan:  #1 Medicare Wellness Exam; criteria met ; data entered #2 Problem List reviewed ; Assessment/ Recommendations made  #3 cough; this is probably mild reactive airways disease following bronchitis last month. Samples of Qvar will be used. She'll be asked to sip room  temperature fluids with each cough. Plan: see Orders

## 2011-06-04 NOTE — Patient Instructions (Signed)
Preventive Health Care: Exercise  30-45  minutes a day, 3-4 days a week. Walking is especially valuable in preventing Osteoporosis. Eat a low-fat diet with lots of fruits and vegetables, up to 7-9 servings per day. Consume less than 30 grams of sugar per day from foods & drinks with High Fructose Corn Syrup as # 1,2,3 or #4 on label. Please  schedule fasting Labs : BMET,hepatic panel, CBC & dif, TSH,vit D level, NMR Lipoprofile .  PLEASE BRING THESE INSTRUCTIONS TO FOLLOW UP  LAB APPOINTMENT.This will guarantee correct labs are drawn, eliminating need for repeat blood sampling ( needle sticks ! ). Diagnoses /Codes: 530.81,272.4,995.20,733.90,V17.3. QVAR 40  one-2  inhalations every 12 hours; gargle and spit after use  For cough

## 2011-06-08 ENCOUNTER — Other Ambulatory Visit: Payer: Self-pay | Admitting: Internal Medicine

## 2011-06-08 DIAGNOSIS — Z8249 Family history of ischemic heart disease and other diseases of the circulatory system: Secondary | ICD-10-CM

## 2011-06-08 DIAGNOSIS — K219 Gastro-esophageal reflux disease without esophagitis: Secondary | ICD-10-CM

## 2011-06-08 DIAGNOSIS — E785 Hyperlipidemia, unspecified: Secondary | ICD-10-CM

## 2011-06-08 DIAGNOSIS — T887XXA Unspecified adverse effect of drug or medicament, initial encounter: Secondary | ICD-10-CM

## 2011-06-08 DIAGNOSIS — M899 Disorder of bone, unspecified: Secondary | ICD-10-CM

## 2011-06-09 ENCOUNTER — Other Ambulatory Visit (INDEPENDENT_AMBULATORY_CARE_PROVIDER_SITE_OTHER): Payer: Medicare Other

## 2011-06-09 DIAGNOSIS — M949 Disorder of cartilage, unspecified: Secondary | ICD-10-CM

## 2011-06-09 DIAGNOSIS — E785 Hyperlipidemia, unspecified: Secondary | ICD-10-CM

## 2011-06-09 DIAGNOSIS — M899 Disorder of bone, unspecified: Secondary | ICD-10-CM

## 2011-06-09 DIAGNOSIS — Z8249 Family history of ischemic heart disease and other diseases of the circulatory system: Secondary | ICD-10-CM

## 2011-06-09 DIAGNOSIS — K219 Gastro-esophageal reflux disease without esophagitis: Secondary | ICD-10-CM

## 2011-06-09 DIAGNOSIS — T887XXA Unspecified adverse effect of drug or medicament, initial encounter: Secondary | ICD-10-CM

## 2011-06-09 LAB — BASIC METABOLIC PANEL
BUN: 14 mg/dL (ref 6–23)
Calcium: 9.5 mg/dL (ref 8.4–10.5)
Creatinine, Ser: 0.8 mg/dL (ref 0.4–1.2)
GFR: 73.09 mL/min (ref >60.00)
Glucose, Bld: 88 mg/dL (ref 70–99)

## 2011-06-09 LAB — CBC WITH DIFFERENTIAL/PLATELET
Basophils Absolute: 0 10*3/uL (ref 0.0–0.1)
Eosinophils Absolute: 0.1 10*3/uL (ref 0.0–0.7)
Eosinophils Relative: 2.8 % (ref 0.0–5.0)
HCT: 41.7 % (ref 36.0–46.0)
Hemoglobin: 13.7 g/dL (ref 12.0–15.0)
Monocytes Absolute: 0.4 10*3/uL (ref 0.1–1.0)
Monocytes Relative: 8 % (ref 3.0–12.0)
Platelets: 307 10*3/uL (ref 150.0–400.0)
RBC: 4.41 Mil/uL (ref 3.87–5.11)
WBC: 5.1 10*3/uL (ref 4.5–10.5)

## 2011-06-09 LAB — HEPATIC FUNCTION PANEL
AST: 28 U/L (ref 0–37)
Albumin: 4.2 g/dL (ref 3.5–5.2)

## 2011-06-10 LAB — VITAMIN D 25 HYDROXY (VIT D DEFICIENCY, FRACTURES): Vit D, 25-Hydroxy: 62 ng/mL (ref 30–89)

## 2011-06-10 LAB — NMR, LIPOPROFILE

## 2011-06-11 ENCOUNTER — Ambulatory Visit: Payer: Medicare Other

## 2011-06-11 ENCOUNTER — Ambulatory Visit (INDEPENDENT_AMBULATORY_CARE_PROVIDER_SITE_OTHER): Payer: Medicare Other | Admitting: *Deleted

## 2011-06-11 DIAGNOSIS — Z23 Encounter for immunization: Secondary | ICD-10-CM

## 2011-06-20 ENCOUNTER — Encounter: Payer: Self-pay | Admitting: Internal Medicine

## 2011-06-25 ENCOUNTER — Encounter: Payer: Self-pay | Admitting: Internal Medicine

## 2011-06-28 ENCOUNTER — Encounter: Payer: Self-pay | Admitting: Internal Medicine

## 2011-07-05 ENCOUNTER — Ambulatory Visit (INDEPENDENT_AMBULATORY_CARE_PROVIDER_SITE_OTHER): Payer: Medicare Other | Admitting: Internal Medicine

## 2011-07-05 ENCOUNTER — Encounter: Payer: Self-pay | Admitting: Internal Medicine

## 2011-07-05 DIAGNOSIS — E785 Hyperlipidemia, unspecified: Secondary | ICD-10-CM

## 2011-07-05 DIAGNOSIS — M899 Disorder of bone, unspecified: Secondary | ICD-10-CM

## 2011-07-05 DIAGNOSIS — T887XXA Unspecified adverse effect of drug or medicament, initial encounter: Secondary | ICD-10-CM

## 2011-07-05 NOTE — Patient Instructions (Addendum)
Normal T scores on a bone density exam (BMD)  are +1 to -1. Osteopenia would be -1.1 to -2.4. Osteoporosis is defined by a  T score worse than -2.4. Treatment should be considered  with  T scores worse than -1.5, particularly if there is  family history of low bone density or personal  past history of atypical fractue.BMD should be monitored every 25 months. Recommended lifestyle interventions to prevent  Osteoporosis include calcium 600 mg twice a day  & vitamin D3 supplementation to keep vitamin  D  level @ least 40-60. The usual vitamin D3 dose is 1000 IU daily; but individual dose is determined by annual vitamin D level monitor. Also weight bearing exercise such as  walking 30-45 minutes 3-4  X per week is recommended.   Risk of premature heart attack or stroke increases as LDL or BAD cholesterol rises.Advanced cholesterol panels optimally determine risk based on particle composition ( NMR Lipoprofile ) or by assessing multiple other genetic risks([facility] Heart Panel or [facility] Lipid Panel). These are indicated when LDL is > 130, especially if there is family history of heart attack in males before 44 or women before 33. Based on your prior advanced testing, your LDL goal is < 130 , ideally < 100. Your present LDL increases long term heart attack or stroke risk 16 %. The best dietary  information on cholesterol is Dr Gildardo Griffes book Eat, Drink & Be Healthy. Use an anti-inflammatory cream such as Aspercreme, Voltaren gel  or Zostrix cream twice a day to the affected joints  as needed. In lieu of this warm moist compresses or  hot water bottle can be used. Do not apply ice to the joints. Please take enteric-coated aspirin 81 mg daily with a meal.

## 2011-07-05 NOTE — Progress Notes (Signed)
  Subjective:    Patient ID: MARGRETTE WYNIA, female    DOB: Oct 16, 1945, 66 y.o.   MRN: [id number]  HPI #1Dyslipidemia assessment:  Advanced Lipid Testing: 06/09/11, NMR.   Family history of premature CAD/ MI: father MI in early 70s .  Nutrition: heart healthy .  Exercise:water aerobics 2X/ week . Diabetes : no . HTN: no. Smoking history  : quit 1965 .   Weight :  stable.  Lab results reviewed :LDL goal = < 130.   #2 Osteopenia: Bone mineral density report was reviewed; she has significant osteopenia at the right femoral neck but values are stable. There appears to be improvement in the lumbar spine. Her vitamin D level has been normal. There's family history of osteoporosis & hip fracture in MGM;no past  history of low-impact fractures. She has taken 2 different bisphosphonates but less than one year of each.       Review of Systems ROS: fatigue: no ; chest pain : no ;claudication:no; palpitations: no; abd pain/bowel changes: no ; myalgias:no;  syncope : no ; memory loss:no but ADD;skin changes: hair thinning.  For 2-3 weeks she's noted some minor swelling and discomfort at the base of the thumbs. She has no other symmetrical joint symptomatology     Objective:   Physical Exam Gen.: Healthy and well-nourished in appearance. Alert, appropriate and cooperative throughout exam. Mouth: Oral mucosa and oropharynx reveal no lesions or exudates. Teeth in good repair. Neck: No deformities, masses, or tenderness noted. Range of motion & Thyroid normal. S4 without murmur. Chest: clear w/o increased WOB.                                                                                  Musculoskeletal/extremities: minimal lordotic  noted of  the thoracic  spine. No clubbing, cyanosis, edema, or deformity noted. Range of motion  normal .Tone & strength  normal.Joints normal. Nail health  good. Vascular: Carotid, radial artery, dorsalis pedis and  posterior tibial pulses are full and equal.  No bruits present. Neurologic: Alert and oriented x3. Deep tendon reflexes symmetrical and normal.          Skin: Intact without suspicious lesions or rashes. Psych: Mood and affect are normal. Normally interactive                                                                                         Assessment & Plan:

## 2011-07-05 NOTE — Assessment & Plan Note (Signed)
Risk and options reviewed. She is adverse to taking a bisphosphonate again. In view of the stability and slight improvement; simply monitoring her bone density and vitamin D level and increasing weightbearing exercises would be recommended

## 2011-07-05 NOTE — Assessment & Plan Note (Signed)
The option of taking atorvastatin 10 mg 2-3 times per week was discussed. She declines at this time;  lipids will be monitored annually. She will continue low-dose aspirin, but change this to after medial rather than at bedtime to reduce risk of gastritis.

## 2012-04-20 ENCOUNTER — Ambulatory Visit (INDEPENDENT_AMBULATORY_CARE_PROVIDER_SITE_OTHER): Payer: Medicare Other | Admitting: Internal Medicine

## 2012-04-20 ENCOUNTER — Encounter: Payer: Self-pay | Admitting: Internal Medicine

## 2012-04-20 VITALS — BP 110/64 | HR 76 | Temp 97.9°F | Wt 148.8 lb

## 2012-04-20 DIAGNOSIS — N390 Urinary tract infection, site not specified: Secondary | ICD-10-CM

## 2012-04-20 LAB — POCT URINALYSIS DIPSTICK
Bilirubin, UA: NEGATIVE
Ketones, UA: NEGATIVE
Spec Grav, UA: >=1.03
pH, UA: 6

## 2012-04-20 MED ORDER — DICLOFENAC SODIUM 1 % TD GEL: 1.0000 "application " | Freq: Two times a day (BID) | TRANSDERMAL | Status: DC | PRN

## 2012-04-20 MED ORDER — NITROFURANTOIN MONOHYD MACRO 100 MG PO CAPS: 100.0000 mg | ORAL_CAPSULE | Freq: Two times a day (BID) | ORAL | Status: DC

## 2012-04-20 NOTE — Progress Notes (Signed)
  Subjective:    Patient ID: Kim Mclaughlin, female    DOB: Apr 15, 1946, 66 y.o.   MRN: [id number]  HPI  Symptoms began 04/18/12 as dysuria and pressure. She also has some slight flank discomfort. She did have some urgency and hesitancy Christmas Day.  She had some response to Azo, Tylenol, fluids, and cranberry juice  She has no past history of significant bladder or kidney dysfunction    Review of Systems She denied fever, chills, sweats, hematuria, or pyuria.     Objective:   Physical Exam General appearance is one of good health and nourishment w/o distress.  Eyes: No conjunctival inflammation   Oral exam: Dental hygiene is good; lips and gums are healthy appearing.There is no oropharyngeal erythema or exudate noted.   Heart:  Normal rate and regular rhythm. S1 and S2 normal without gallop, murmur, click, rub or other extra sounds  . S4 with slurring   Lungs:Chest clear to auscultation; no wheezes, rhonchi,rales ,or rubs present.No increased work of breathing.   Abdomen: bowel sounds normal, soft and non-tender without masses, organomegaly or hernias noted.  No guarding or rebound   Skin:Warm & dry.  Intact without suspicious lesions or rashes  Lymphatic: No lymphadenopathy is noted about the head, neck, axilla              Assessment & Plan:   #1 UTI Plan: See orders and recommendations

## 2012-04-20 NOTE — Patient Instructions (Addendum)
Drink as much nondairy fluids as possible. Avoid spicy foods or alcohol as  these may aggravate the bladder. Do not take decongestants. Avoid narcotics if possible. 

## 2012-04-22 LAB — URINE CULTURE

## 2012-04-27 ENCOUNTER — Encounter: Payer: Self-pay | Admitting: *Deleted

## 2012-04-29 ENCOUNTER — Ambulatory Visit (INDEPENDENT_AMBULATORY_CARE_PROVIDER_SITE_OTHER): Payer: Medicare PPO | Admitting: Family Medicine

## 2012-04-29 ENCOUNTER — Encounter: Payer: Self-pay | Admitting: Family Medicine

## 2012-04-29 VITALS — BP 96/62 | HR 113 | Temp 97.7°F | Ht 63.5 in | Wt 145.0 lb

## 2012-04-29 DIAGNOSIS — R300 Dysuria: Secondary | ICD-10-CM

## 2012-04-29 DIAGNOSIS — R112 Nausea with vomiting, unspecified: Secondary | ICD-10-CM

## 2012-04-29 LAB — POCT URINALYSIS DIPSTICK
Bilirubin, UA: NEGATIVE
Glucose, UA: NEGATIVE
Leukocytes, UA: NEGATIVE
Nitrite, UA: NEGATIVE
pH, UA: 6

## 2012-04-29 MED ORDER — CEFTRIAXONE SODIUM 1 G IJ SOLR
1.0000 g | Freq: Once | INTRAMUSCULAR | Status: AC
  Administered 2012-04-29: 1 g via INTRAMUSCULAR

## 2012-04-29 MED ORDER — PROMETHAZINE HCL 25 MG PO TABS: 25.0000 mg | ORAL_TABLET | Freq: Three times a day (TID) | ORAL | Status: DC | PRN

## 2012-04-29 MED ORDER — PROMETHAZINE HCL 50 MG/ML IJ SOLN
50.0000 mg | Freq: Once | INTRAMUSCULAR | Status: AC
  Administered 2012-04-29: 50 mg via INTRAMUSCULAR

## 2012-04-29 NOTE — Addendum Note (Signed)
Addended by: Ellsworth Lennox on: 04/29/2012 01:03 PM   Modules accepted: Orders

## 2012-04-29 NOTE — Progress Notes (Signed)
  Subjective:     Kim Mclaughlin is a 67 y.o. female who presents for evaluation of nonbilious vomiting 2 times per day, nausea and loose stools. Symptoms have been present for 2 days. Patient denies acholic stools, blood in stool, constipation, dark urine, diarrhea a few times per day, dysuria, fever, heartburn, hematemesis, hematuria and melena. Patient's oral intake has been normal. Patient's urine output has been adequate. Other contacts with similar symptoms include: spouse. Patient denies recent travel history. Patient has not had recent ingestion of possible contaminated food, toxic plants, or inappropriate medications/poisons.  Pt was recently put on abx for UTI---  But has not been able to take it due to N/V.    The following portions of the patient's history were reviewed and updated as appropriate: allergies, current medications, past family history, past medical history, past social history, past surgical history and problem list.  Review of Systems Pertinent items are noted in HPI.    Objective:     BP 96/62  Pulse 113  Temp 97.7 F (36.5 C) (Oral)  Ht 5' 3.5" (1.613 m)  Wt 145 lb (65.772 kg)  BMI 25.28 kg/m2  SpO2 96% General appearance: alert, cooperative, appears stated age and mild distress Throat: abnormal findings: lips dry,  tongue moist Neck: no adenopathy, supple, symmetrical, trachea midline and thyroid not enlarged, symmetric, no tenderness/mass/nodules Lungs: clear to auscultation bilaterally Heart: S1, S2 normal Abdomen: soft, non-tender; bowel sounds normal; no masses,  no organomegaly    Assessment:    Acute Gastroenteritis    Plan:    1. Discussed oral rehydration, reintroduction of solid foods, signs of dehydration. 2. Return or go to emergency department if worsening symptoms, blood or bile, signs of dehydration, diarrhea lasting longer than 5 days or any new concerns. 3. Follow up in a few days or sooner as needed.  4 phenergan injection given  in office and rocephin since pt unable to take po abx for uti

## 2012-04-29 NOTE — Patient Instructions (Addendum)
Nausea and Vomiting Nausea is a sick feeling that often comes before throwing up (vomiting). Vomiting is a reflex where stomach contents come out of your mouth. Vomiting can cause severe loss of body fluids (dehydration). Children and elderly adults can become dehydrated quickly, especially if they also have diarrhea. Nausea and vomiting are symptoms of a condition or disease. It is important to find the cause of your symptoms. CAUSES   Direct irritation of the stomach lining. This irritation can result from increased acid production (gastroesophageal reflux disease), infection, food poisoning, taking certain medicines (such as nonsteroidal anti-inflammatory drugs), alcohol use, or tobacco use.  Signals from the brain.These signals could be caused by a headache, heat exposure, an inner ear disturbance, increased pressure in the brain from injury, infection, a tumor, or a concussion, pain, emotional stimulus, or metabolic problems.  An obstruction in the gastrointestinal tract (bowel obstruction).  Illnesses such as diabetes, hepatitis, gallbladder problems, appendicitis, kidney problems, cancer, sepsis, atypical symptoms of a heart attack, or eating disorders.  Medical treatments such as chemotherapy and radiation.  Receiving medicine that makes you sleep (general anesthetic) during surgery. DIAGNOSIS Your caregiver may ask for tests to be done if the problems do not improve after a few days. Tests may also be done if symptoms are severe or if the reason for the nausea and vomiting is not clear. Tests may include:  Urine tests.  Blood tests.  Stool tests.  Cultures (to look for evidence of infection).  X-rays or other imaging studies. Test results can help your caregiver make decisions about treatment or the need for additional tests. TREATMENT You need to stay well hydrated. Drink frequently but in small amounts.You may wish to drink water, sports drinks, clear broth, or eat frozen  ice pops or gelatin dessert to help stay hydrated.When you eat, eating slowly may help prevent nausea.There are also some antinausea medicines that may help prevent nausea. HOME CARE INSTRUCTIONS   Take all medicine as directed by your caregiver.  If you do not have an appetite, do not force yourself to eat. However, you must continue to drink fluids.  If you have an appetite, eat a normal diet unless your caregiver tells you differently.  Eat a variety of complex carbohydrates (rice, wheat, potatoes, bread), lean meats, yogurt, fruits, and vegetables.  Avoid high-fat foods because they are more difficult to digest.  Drink enough water and fluids to keep your urine clear or pale yellow.  If you are dehydrated, ask your caregiver for specific rehydration instructions. Signs of dehydration may include:  Severe thirst.  Dry lips and mouth.  Dizziness.  Dark urine.  Decreasing urine frequency and amount.  Confusion.  Rapid breathing or pulse. SEEK IMMEDIATE MEDICAL CARE IF:   You have blood or brown flecks (like coffee grounds) in your vomit.  You have black or bloody stools.  You have a severe headache or stiff neck.  You are confused.  You have severe abdominal pain.  You have chest pain or trouble breathing.  You do not urinate at least once every 8 hours.  You develop cold or clammy skin.  You continue to vomit for longer than 24 to 48 hours.  You have a fever. MAKE SURE YOU:   Understand these instructions.  Will watch your condition.  Will get help right away if you are not doing well or get worse. Document Released: 04/12/2005 Document Revised: 07/05/2011 Document Reviewed: 09/09/2010 Exit[employer] Patient Information 2013 [employer].  

## 2012-05-01 ENCOUNTER — Ambulatory Visit (INDEPENDENT_AMBULATORY_CARE_PROVIDER_SITE_OTHER): Payer: Medicare PPO | Admitting: Internal Medicine

## 2012-05-01 ENCOUNTER — Encounter: Payer: Self-pay | Admitting: Internal Medicine

## 2012-05-01 VITALS — BP 102/74 | HR 80 | Temp 97.9°F | Wt 142.8 lb

## 2012-05-01 DIAGNOSIS — K5289 Other specified noninfective gastroenteritis and colitis: Secondary | ICD-10-CM

## 2012-05-01 DIAGNOSIS — K529 Noninfective gastroenteritis and colitis, unspecified: Secondary | ICD-10-CM

## 2012-05-01 DIAGNOSIS — N390 Urinary tract infection, site not specified: Secondary | ICD-10-CM

## 2012-05-01 LAB — CBC WITH DIFFERENTIAL/PLATELET
Basophils Relative: 0.2 % (ref 0.0–3.0)
Eosinophils Absolute: 0.7 10*3/uL (ref 0.0–0.7)
HCT: 43 % (ref 36.0–46.0)
Hemoglobin: 14.4 g/dL (ref 12.0–15.0)
Lymphocytes Relative: 25.4 % (ref 12.0–46.0)
Lymphs Abs: 1.7 10*3/uL (ref 0.7–4.0)
MCHC: 33.6 g/dL (ref 30.0–36.0)
Neutro Abs: 4 10*3/uL (ref 1.4–7.7)
RBC: 4.63 Mil/uL (ref 3.87–5.11)

## 2012-05-01 LAB — BASIC METABOLIC PANEL
CO2: 26 mEq/L (ref 19–32)
Calcium: 9.7 mg/dL (ref 8.4–10.5)
Chloride: 104 mEq/L (ref 96–112)
Potassium: 4.5 mEq/L (ref 3.5–5.1)
Sodium: 141 mEq/L (ref 135–145)

## 2012-05-01 LAB — POCT URINALYSIS DIPSTICK
Bilirubin, UA: NEGATIVE
Glucose, UA: NEGATIVE
Spec Grav, UA: 1.02

## 2012-05-01 NOTE — Progress Notes (Signed)
  Subjective:    Patient ID: Kim Mclaughlin, female    DOB: Feb 10, 1946, 67 y.o.   MRN: [id number]  HPI  The chart was reviewed; she had a significant Escherichia coli urinary tract infection documented 04/22/12. It was sensitive to nitrofurantoin.  She was seen 04/29/12 with gastroenteritis type symptoms. She initially showed some improvement; 11/5 she had chills and nausea for which she took Phenergan Tylenol. This was followed by significant sweating.    Review of Systems  Despite the chills and nausea; she denies abdominal pain, diarrhea, dysuria, pyuria, or hematuria .   She denies Coke-colored urine or white, chalky stools.    Objective:   Physical Exam General appearance is one of good health and nourishment w/o distress.  Eyes: No conjunctival inflammation or scleral icterus is present.  Oral exam: Dental hygiene is good; lips and gums are healthy appearing.There is no oropharyngeal erythema or exudate noted.   Heart:  Normal rate and regular rhythm. S1 and S2 normal without gallop, murmur, click, rub or other extra sounds     Lungs:Chest clear to auscultation; no wheezes, rhonchi,rales ,or rubs present.No increased work of breathing.   Abdomen: bowel sounds decreased, soft and non-tender without masses, organomegaly or hernias noted.  No guarding or rebound   Skin:Warm & dry.  Intact without suspicious lesions or rashes ; no jaundice or tenting  Lymphatic: No lymphadenopathy is noted about the head, neck, axilla            Assessment & Plan:  #1 gastroenteritis  #2 recent Escherichia coli urinary tract infection  Plan: See orders and recommendations

## 2012-05-01 NOTE — Patient Instructions (Addendum)
Stay on clear liquids for 48-72 hours .This would include  jello, sherbert (NOT ice cream), Lipton's chicken noodle soup(NOT cream based soups),Gatorade Lite, flat Ginger ale (without High Fructose Corn Syrup),dry toast or crackers, baked potato.No milk , dairy or grease until well. Align , a [employer] , daily if stools are loose. Immodium AD for frankly watery stool. Report increasing pain, fever or rectal bleeding

## 2012-05-01 NOTE — Addendum Note (Signed)
Addended byDuaine Dredge, Taneesha Edgin L on: 05/01/2012 11:13 AM   Modules accepted: Orders

## 2012-05-02 ENCOUNTER — Encounter: Payer: Self-pay | Admitting: Internal Medicine

## 2012-05-03 ENCOUNTER — Encounter: Payer: Self-pay | Admitting: Internal Medicine

## 2012-05-03 LAB — URINE CULTURE: Organism ID, Bacteria: NO GROWTH

## 2012-05-16 ENCOUNTER — Encounter: Payer: Self-pay | Admitting: Internal Medicine

## 2012-05-30 ENCOUNTER — Encounter: Payer: Self-pay | Admitting: Internal Medicine

## 2012-06-21 ENCOUNTER — Encounter: Payer: Self-pay | Admitting: Internal Medicine

## 2012-06-21 ENCOUNTER — Ambulatory Visit (INDEPENDENT_AMBULATORY_CARE_PROVIDER_SITE_OTHER): Payer: Medicare PPO | Admitting: Internal Medicine

## 2012-06-21 VITALS — BP 114/68 | HR 89 | Temp 98.0°F | Wt 148.0 lb

## 2012-06-21 DIAGNOSIS — M545 Low back pain, unspecified: Secondary | ICD-10-CM

## 2012-06-21 MED ORDER — CYCLOBENZAPRINE HCL 5 MG PO TABS: ORAL_TABLET | ORAL | Status: DC

## 2012-06-21 NOTE — Telephone Encounter (Signed)
Hopp please advise  

## 2012-06-21 NOTE — Progress Notes (Signed)
  Subjective:    Patient ID: Kim Mclaughlin, female    DOB: 02/02/1946, 67 y.o.   MRN: [id number]  HPI BACK PAIN: Back pain began 05/19/12 in the  lumbosacral spine area in the context of waist flexion w/o overuse or hyperextension. She had been riding in a car for up to 8 hours over the prior weekend. She was visiting family and was sleeping on soft mattress.  The pain is described as dull  and  radiating to  right thoracic area. The pain is worse  ambulating . Pain has improved slightly with heat, nonsteroidals, & ASA . No associated  numbness and tingling  or weakness in legs.                                                                                   Review of Systems Constitutional: No fever, chills, sweats, unexplained weight loss GI: No abdominal pain;  melena; rectal bleeding GU: No hematuria, pyuria, or dysuria MS: No joint stiffness;redness Heme/Lymph:No abnormal bruising or bleeding      Objective:   Physical Exam Gen.: Healthy and well-nourished in appearance. Alert, appropriate and cooperative throughout exam. Neck: No deformities, masses, or tenderness noted. Range of motion normal  Abdomen: Bowel sounds normal; abdomen soft and nontender. No masses, organomegaly or hernias noted.                                  Musculoskeletal/extremities: No deformity or scoliosis noted of  the thoracic or lumbar spine. No clubbing, cyanosis, edema, or significant extremity  deformity noted. Range of motion normal .Tone & strength  normal.Joints normal. Nail health good. Able to lie down & sit up w/o help. She exhibits a classic "low back crawl" sitting up from the exam table. Negative SLR bilaterally Vascular:  dorsalis pedis and  posterior tibial pulses are full and equal.  Neurologic: Alert and oriented x3. Deep tendon reflexes symmetrical and normal.Gait including heel & toe walking normal.        Skin: Intact without suspicious lesions or rashes. Lymph: No cervical,  axillary lymphadenopathy present. Psych: Mood and affect are normal. Normally interactive          Assessment & Plan:  #1 acute low back syndrome in the context of prolonged driving and sit on a soft mattress  Plan: See orders and recommendations

## 2012-06-21 NOTE — Patient Instructions (Addendum)
The best exercises for the low back include freestyle swimming, stretch aerobics, and yoga.Cybex & Nautilus rather than dead weights are better for the back. Take Aleve one to 2 every 8-12 hours with food as needed. Do not take aspirin or ibuprofen with Aleve.

## 2012-06-28 ENCOUNTER — Ambulatory Visit (AMBULATORY_SURGERY_CENTER): Payer: Medicare PPO

## 2012-06-28 ENCOUNTER — Encounter: Payer: Self-pay | Admitting: Internal Medicine

## 2012-06-28 VITALS — Ht 63.5 in | Wt 148.4 lb

## 2012-06-28 DIAGNOSIS — Z1211 Encounter for screening for malignant neoplasm of colon: Secondary | ICD-10-CM

## 2012-06-28 MED ORDER — NA SULFATE-K SULFATE-MG SULF 17.5-3.13-1.6 GM/177ML PO SOLN: 1.0000 | Freq: Once | ORAL | Status: DC

## 2012-06-28 MED ORDER — METOCLOPRAMIDE HCL 10 MG PO TABS: 10.0000 mg | ORAL_TABLET | Freq: Two times a day (BID) | ORAL | Status: DC | PRN

## 2012-07-10 ENCOUNTER — Telehealth: Payer: Self-pay | Admitting: Internal Medicine

## 2012-07-10 NOTE — Telephone Encounter (Signed)
No charge. 

## 2012-07-11 ENCOUNTER — Encounter: Payer: Medicare PPO | Admitting: Internal Medicine

## 2012-07-25 HISTORY — PX: OTHER SURGICAL HISTORY: SHX169

## 2012-08-07 ENCOUNTER — Encounter: Payer: Self-pay | Admitting: Lab

## 2012-08-08 ENCOUNTER — Encounter: Payer: Self-pay | Admitting: Internal Medicine

## 2012-08-08 ENCOUNTER — Ambulatory Visit (INDEPENDENT_AMBULATORY_CARE_PROVIDER_SITE_OTHER): Payer: Medicare PPO | Admitting: Internal Medicine

## 2012-08-08 VITALS — BP 124/78 | HR 67 | Temp 98.0°F | Resp 14 | Ht 63.03 in | Wt 147.0 lb

## 2012-08-08 DIAGNOSIS — E785 Hyperlipidemia, unspecified: Secondary | ICD-10-CM

## 2012-08-08 DIAGNOSIS — M1712 Unilateral primary osteoarthritis, left knee: Secondary | ICD-10-CM

## 2012-08-08 DIAGNOSIS — M1710 Unilateral primary osteoarthritis, unspecified knee: Secondary | ICD-10-CM

## 2012-08-08 DIAGNOSIS — Z0000 Encounter for general adult medical examination without abnormal findings: Secondary | ICD-10-CM

## 2012-08-08 DIAGNOSIS — M899 Disorder of bone, unspecified: Secondary | ICD-10-CM

## 2012-08-08 DIAGNOSIS — M949 Disorder of cartilage, unspecified: Secondary | ICD-10-CM

## 2012-08-08 DIAGNOSIS — IMO0002 Reserved for concepts with insufficient information to code with codable children: Secondary | ICD-10-CM

## 2012-08-08 LAB — HEPATIC FUNCTION PANEL
AST: 26 U/L (ref 0–37)
Alkaline Phosphatase: 65 U/L (ref 39–117)
Total Bilirubin: 0.8 mg/dL (ref 0.3–1.2)

## 2012-08-08 LAB — TSH: TSH: 0.68 u[IU]/mL (ref 0.35–5.50)

## 2012-08-08 LAB — LIPID PANEL
Total CHOL/HDL Ratio: 5
VLDL: 20.4 mg/dL (ref 0.0–40.0)

## 2012-08-08 NOTE — Patient Instructions (Addendum)
Consider glucosamine sulfate, a total of  1500 mg daily, for joint symptoms. Take this daily  for 3 months and then leave it off for 2 months. This will rehydrate the cartilages. Use an anti-inflammatory cream such as Voltaren gel ,Aspercreme ,or  Zostrix cream twice a day to the left knee as needed. In lieu of this warm moist compresses or  hot water bottle can be used. Do not apply ice to the knees.

## 2012-08-08 NOTE — Progress Notes (Signed)
Subjective:    Patient ID: Kim Mclaughlin, female    DOB: 01/23/1946, 67 y.o.   MRN: [id number]  HPI Medicare Wellness Visit:  Psychosocial & medical history were reviewed as required by Medicare (abuse,antisocial behavioral risks,firearm risk).  Social history: caffeine: 1-2 cups coffee/ day , alcohol:no   ,  tobacco use:  essentially < 1 yr Exercise :  @ gym as water aerobics & machines No home & personal  safety / fall risk Activities of daily living: no limitations  Seatbelt  and smoke alarm employed. Power of Attorney/Living Will status : in place Ophthalmology exam pending Hearing evaluation not current Orientation :oriented X 3  Memory & recall :good Math testing:good Mood & affect : normal . Depression / anxiety: denied Travel history : never  Immunization status : Shingles needed Transfusion history:  none  Preventive health surveillance :Colonoscopy pending. BMD , mammograms,PAP as per protocol/ SOC current  Dental care:  Every 6 mos. Chart reviewed &  Updated. Active issues reviewed & addressed.     Review of Systems She injured her L knee 07/26/12 in water aerobics class. No pop heard or felt.The pain is described as burning  up to level 9. The pain does not radiate.  The discomfort  has been  Constant until 4/14. It is exacerbated by ambulation & in LLDP or RLDP @ night  Associated signs/symptoms include  swelling w/o redness,stiffness, skin color change ,or  temperature change The pain was treated with NSAIDS &  ice ; response was partial .    Constitutional: no fever, chills, sweats Neuro: no weakness;  numbness and tingling Heme:no lymphadenopathy; abnormal bruising or bleeding                                                                               Objective:   Physical Exam Gen.: Healthy and well-nourished in appearance. Alert, appropriate and cooperative throughout exam.  Head: Normocephalic without obvious abnormalities Eyes: No corneal  or conjunctival inflammation noted. Extraocular motion intact. Vision grossly normal with lenses Ears: External  ear exam reveals no significant lesions or deformities. Canals clear .TMs normal. Hearing is grossly normal bilaterally. Nose: External nasal exam reveals no deformity or inflammation. Nasal mucosa are pink and moist. No lesions or exudates noted.   Mouth: Oral mucosa and oropharynx reveal no lesions or exudates. Teeth in good repair. Neck: No deformities, masses, or tenderness noted. Range of motion decreased. Thyroid normal. Lungs: Normal respiratory effort; chest expands symmetrically. Lungs are clear to auscultation without rales, wheezes, or increased work of breathing. Heart: Normal rate and rhythm. Normal S1 and S2. No gallop, click, or rub.No murmur. Abdomen: Bowel sounds normal; abdomen soft and nontender. No masses, organomegaly or hernias noted. Genitalia:As per Gyn, Dr Greta Doom                               Musculoskeletal/extremities: No significant  deformity or scoliosis noted of  the thoracic or lumbar spine.  No clubbing, cyanosis, edema, or significant extremity  deformity noted. Range of motion normal .Tone & strength  Normal. Ganglion R index DIP ; isolated  mild  DJD DIP  changes both index fingers. Nail health good. Able to lie down & sit up w/o help. Negative SLR bilaterally. Crepitus L knee > R Vascular: Carotid, radial artery, dorsalis pedis and  posterior tibial pulses are full and equal. No bruits present.Prominent varicose veins L ankle Neurologic: Alert and oriented x3. Deep tendon reflexes symmetrical and normal.         Skin: Intact without suspicious lesions or rashes. Lymph: No cervical, axillary lymphadenopathy present. Psych: Mood and affect are normal. Normally interactive                                                                                       Assessment & Plan:  #1 Medicare Wellness Exam; criteria met ; data entered #2 DJD knees, L  symptomatic #3 Problem List reviewed ; Assessment/ Recommendations made Plan: see Orders

## 2012-08-12 LAB — VITAMIN D 1,25 DIHYDROXY
Vitamin D 1, 25 (OH)2 Total: 65 pg/mL (ref 18–72)
Vitamin D2 1, 25 (OH)2: <8 pg/mL
Vitamin D3 1, 25 (OH)2: 65 pg/mL

## 2012-08-16 ENCOUNTER — Encounter: Payer: Self-pay | Admitting: Internal Medicine

## 2012-08-18 ENCOUNTER — Encounter: Payer: Medicare PPO | Admitting: Internal Medicine

## 2012-08-18 ENCOUNTER — Encounter: Payer: Self-pay | Admitting: Internal Medicine

## 2012-08-18 ENCOUNTER — Ambulatory Visit (AMBULATORY_SURGERY_CENTER): Payer: Medicare PPO | Admitting: Internal Medicine

## 2012-08-18 VITALS — BP 130/72 | HR 71 | Temp 96.8°F | Resp 15 | Ht 63.0 in | Wt 148.0 lb

## 2012-08-18 DIAGNOSIS — D126 Benign neoplasm of colon, unspecified: Secondary | ICD-10-CM

## 2012-08-18 DIAGNOSIS — Z1211 Encounter for screening for malignant neoplasm of colon: Secondary | ICD-10-CM

## 2012-08-18 MED ORDER — SODIUM CHLORIDE 0.9 % IV SOLN: 500.0000 mL | INTRAVENOUS | Status: DC

## 2012-08-18 NOTE — Progress Notes (Signed)
Patient did not experience any of the following events: a burn prior to discharge; a fall within the facility; wrong site/side/patient/procedure/implant event; or a hospital transfer or hospital admission upon discharge from the facility. (G8907) Patient did not have preoperative order for IV antibiotic SSI prophylaxis. (G8918)  

## 2012-08-18 NOTE — Patient Instructions (Addendum)
YOU HAD AN ENDOSCOPIC PROCEDURE TODAY AT THE [facility]: Refer to the procedure report that was given to you for any specific questions about what was found during the examination.  If the procedure report does not answer your questions, please call your gastroenterologist to clarify.  If you requested that your care partner not be given the details of your procedure findings, then the procedure report has been included in a sealed envelope for you to review at your convenience later.  YOU SHOULD EXPECT: Some feelings of bloating in the abdomen. Passage of more gas than usual.  Walking can help get rid of the air that was put into your GI tract during the procedure and reduce the bloating. If you had a lower endoscopy (such as a colonoscopy or flexible sigmoidoscopy) you may notice spotting of blood in your stool or on the toilet paper. If you underwent a bowel prep for your procedure, then you may not have a normal bowel movement for a few days.  DIET: Your first meal following the procedure should be a light meal and then it is ok to progress to your normal diet.  A half-sandwich or bowl of soup is an example of a good first meal.  Heavy or fried foods are harder to digest and may make you feel nauseous or bloated.  Likewise meals heavy in dairy and vegetables can cause extra gas to form and this can also increase the bloating.  Drink plenty of fluids but you should avoid alcoholic beverages for 24 hours.  ACTIVITY: Your care partner should take you home directly after the procedure.  You should plan to take it easy, moving slowly for the rest of the day.  You can resume normal activity the day after the procedure however you should NOT DRIVE or use heavy machinery for 24 hours (because of the sedation medicines used during the test).    SYMPTOMS TO REPORT IMMEDIATELY: A gastroenterologist can be reached at any hour.  During normal business hours, 8:30 AM to 5:00 PM Monday through Friday,  call [phone].  After hours and on weekends, please call the GI answering service at [phone] who will take a message and have the physician on call contact you.   Following lower endoscopy (colonoscopy or flexible sigmoidoscopy):  Excessive amounts of blood in the stool  Significant tenderness or worsening of abdominal pains  Swelling of the abdomen that is new, acute  Fever of 100F or higher  Following upper endoscopy (EGD)  Vomiting of blood or coffee ground material  New chest pain or pain under the shoulder blades  Painful or persistently difficult swallowing  New shortness of breath  Fever of 100F or higher  Black, tarry-looking stools  FOLLOW UP: If any biopsies were taken you will be contacted by phone or by letter within the next 1-3 weeks.  Call your gastroenterologist if you have not heard about the biopsies in 3 weeks.  Our staff will call the home number listed on your records the next business day following your procedure to check on you and address any questions or concerns that you may have at that time regarding the information given to you following your procedure. This is a courtesy call and so if there is no answer at the home number and we have not heard from you through the emergency physician on call, we will assume that you have returned to your regular daily activities without incident.  SIGNATURES/CONFIDENTIALITY: You and/or your care   partner have signed paperwork which will be entered into your electronic medical record.  These signatures attest to the fact that that the information above on your After Visit Summary has been reviewed and is understood.  Full responsibility of the confidentiality of this discharge information lies with you and/or your care-partner.  

## 2012-08-18 NOTE — Progress Notes (Signed)
Called to room to assist during endoscopic procedure.  Patient ID and intended procedure confirmed with present staff. Received instructions for my participation in the procedure from the performing physician. ewm 

## 2012-08-18 NOTE — Op Note (Signed)
[facility] [address] [place], [address]   COLONOSCOPY PROCEDURE REPORT  PATIENT: Kim Mclaughlin, Kim Mclaughlin  MR#: [id number] BIRTHDATE: August 15, 1945 , 67  yrs. old GENDER: Female ENDOSCOPIST: Hart Carwin, MD REFERRED BY:  recall colonoscopy PROCEDURE DATE:  08/18/2012 PROCEDURE:   Colonoscopy with cold biopsy polypectomy ASA CLASS:   Class II INDICATIONS:Patient's immediate family history of colon cancer. (father), and Malen Gauze, and Paternal aunt, last colon 2003,2009 MEDICATIONS: MAC sedation, administered by CRNA and propofol (Diprivan) 250mg  IV  DESCRIPTION OF PROCEDURE:   After the risks and benefits and of the procedure were explained, informed consent was obtained.  A digital rectal exam revealed no abnormalities of the rectum.    The LB [id number]-[id number]  endoscope was introduced through the anus and advanced to the cecum, which was identified by both the appendix and ileocecal valve .  The quality of the prep was good, using MoviPrep .  The instrument was then slowly withdrawn as the colon was fully examined.     COLON FINDINGS: A diminutive firm flat polyp was found in the rectum.  A polypectomy was performed with cold forceps.  The resection was complete and the polyp tissue was completely retrieved.     Retroflexed views revealed no abnormalities.     The scope was then withdrawn from the patient and the procedure completed.  COMPLICATIONS: There were no complications. ENDOSCOPIC IMPRESSION: Diminutive flat polyp was found in the rectum; polypectomy was performed with cold forceps  RECOMMENDATIONS: 1.  Await pathology results 2.  High fiber diet   REPEAT EXAM: In 10 day(s)  for Colonoscopy.  cc:  _______________________________ eSignedHart Carwin, MD 08/18/2012 12:21 PM     PATIENT NAME:  Kim Mclaughlin, Kim Mclaughlin MR#: [id number]

## 2012-08-21 ENCOUNTER — Telehealth: Payer: Self-pay | Admitting: *Deleted

## 2012-08-21 NOTE — Telephone Encounter (Signed)
  Follow up Call-  Call back number 08/18/2012  Post procedure Call Back phone  # [phone]  Permission to leave phone message Yes     Patient questions:  Do you have a fever, pain , or abdominal swelling? no Pain Score  0 *  Have you tolerated food without any problems? yes  Have you been able to return to your normal activities? yes  Do you have any questions about your discharge instructions: Diet   no Medications  no Follow up visit  no  Do you have questions or concerns about your Care? no  Actions: * If pain score is 4 or above: No action needed, pain <4.

## 2012-08-22 ENCOUNTER — Encounter: Payer: Self-pay | Admitting: Internal Medicine

## 2012-08-31 ENCOUNTER — Encounter: Payer: Medicare PPO | Admitting: Internal Medicine

## 2012-12-13 ENCOUNTER — Encounter: Payer: Self-pay | Admitting: Internal Medicine

## 2013-03-01 ENCOUNTER — Other Ambulatory Visit: Payer: Self-pay

## 2013-04-26 DIAGNOSIS — I82409 Acute embolism and thrombosis of unspecified deep veins of unspecified lower extremity: Secondary | ICD-10-CM

## 2013-04-26 HISTORY — DX: Acute embolism and thrombosis of unspecified deep veins of unspecified lower extremity: I82.409

## 2013-06-18 LAB — HM DEXA SCAN

## 2013-08-15 ENCOUNTER — Encounter: Payer: Self-pay | Admitting: Internal Medicine

## 2013-08-15 ENCOUNTER — Ambulatory Visit (INDEPENDENT_AMBULATORY_CARE_PROVIDER_SITE_OTHER): Payer: Medicare PPO | Admitting: Internal Medicine

## 2013-08-15 VITALS — BP 120/72 | HR 77 | Temp 96.5°F | Ht 63.0 in | Wt 145.8 lb

## 2013-08-15 DIAGNOSIS — K635 Polyp of colon: Secondary | ICD-10-CM

## 2013-08-15 DIAGNOSIS — M899 Disorder of bone, unspecified: Secondary | ICD-10-CM

## 2013-08-15 DIAGNOSIS — D126 Benign neoplasm of colon, unspecified: Secondary | ICD-10-CM

## 2013-08-15 DIAGNOSIS — I8390 Asymptomatic varicose veins of unspecified lower extremity: Secondary | ICD-10-CM

## 2013-08-15 DIAGNOSIS — E785 Hyperlipidemia, unspecified: Secondary | ICD-10-CM

## 2013-08-15 DIAGNOSIS — M949 Disorder of cartilage, unspecified: Secondary | ICD-10-CM

## 2013-08-15 NOTE — Progress Notes (Signed)
Pre visit review using our clinic review tool, if applicable. No additional management support is needed unless otherwise documented below in the visit note. 

## 2013-08-15 NOTE — Assessment & Plan Note (Signed)
Lipids, LFTs,CK 

## 2013-08-15 NOTE — Assessment & Plan Note (Signed)
Vascular referral

## 2013-08-15 NOTE — Patient Instructions (Signed)
Your next office appointment will be determined based upon review of your pending labs. Those instructions will be transmitted to you through My Chart . 

## 2013-08-15 NOTE — Assessment & Plan Note (Signed)
Review BMD when available BMET, vit D level,TSH BMD every 25 months

## 2013-08-15 NOTE — Assessment & Plan Note (Signed)
CBC & dif 

## 2013-08-15 NOTE — Progress Notes (Signed)
Subjective:    Patient ID: Kim Mclaughlin, female    DOB: May 14, 1945, 68 y.o.   MRN: [id number]  HPIShe is here to assess active health issues & conditions. PMH, FH, & Social history verified & updated   A heart healthy neg for premature coronary disease. Advanced cholesterol testing reveals  LDL goal is less than 130; ideally < 100. Statin declined in place of TLC.  Low dose ASA taken    Review of Systems Specifically denied are  chest pain, palpitations, dyspnea, or claudication.   Her varicose veins are felt to be progressive. She has had minor skin breakdown without frank ulceration. There is no associated pain.   Her bone density study was completed 08/08/13; those results are pending. She does take vitamin E supplements; she is unsure of the exact amount. She is not on a regular exercise program.       Objective:   Physical Exam Gen.: Healthy and well-nourished in appearance. Alert, appropriate and cooperative throughout exam. Appears younger than stated age. Head: Normocephalic without obvious abnormalities;  no alopecia  Eyes: No corneal or conjunctival inflammation noted. Pupils equal round reactive to light and accommodation. Extraocular motion intact. Ears: External  ear exam reveals no significant lesions or deformities. Canals clear .TMs normal. Hearing is grossly normal bilaterally. Nose: External nasal exam reveals no deformity or inflammation. Nasal mucosa are pink and moist. No lesions or exudates noted.   Mouth: Oral mucosa and oropharynx reveal no lesions or exudates. Teeth in good repair.Osteom of hard palate Neck: No deformities, masses, or tenderness noted. Range of motion WNL. Thyroid palpable. Lungs: Normal respiratory effort; chest expands symmetrically. Lungs are clear to auscultation without rales, wheezes, or increased work of breathing. Heart: Normal rate and rhythm. Normal S1 and S2. No gallop, click, or rub. No murmur. Abdomen: Bowel sounds  normal; abdomen soft and nontender. No masses, organomegaly or hernias noted. Genitalia:  as per Gyn                                  Musculoskeletal/extremities: No deformity or scoliosis noted of  the thoracic or lumbar spine.  No clubbing, cyanosis, edema, or significant extremity  deformity noted. Range of motion normal .Tone & strength normal. Hand joints normal; isolated DIP changes Fingernail health good. Able to lie down & sit up w/o help. Negative SLR bilaterally Vascular: Carotid, radial artery, dorsalis pedis and  posterior tibial pulses are full and equal. No bruits present. Vascular spiders; BLE varicosities empty with elevation Neurologic: Alert and oriented x3. Deep tendon reflexes symmetrical and normal.  Gait normal   Skin: Intact without suspicious lesions or rashes. Lymph: No cervical, axillary lymphadenopathy present. Psych: Mood and affect are normal. Normally interactive                                                                                        Assessment & Plan:  See Current Assessment & Plan in Problem List under specific DiagnosisThe labs will be reviewed and risks and options assessed. Written recommendations will be provided by mail or  directly through My Chart.Further evaluation or change in medical therapy will be directed by those results.

## 2013-08-16 ENCOUNTER — Other Ambulatory Visit (INDEPENDENT_AMBULATORY_CARE_PROVIDER_SITE_OTHER): Payer: Medicare PPO

## 2013-08-16 DIAGNOSIS — M899 Disorder of bone, unspecified: Secondary | ICD-10-CM

## 2013-08-16 DIAGNOSIS — D126 Benign neoplasm of colon, unspecified: Secondary | ICD-10-CM

## 2013-08-16 DIAGNOSIS — M949 Disorder of cartilage, unspecified: Secondary | ICD-10-CM

## 2013-08-16 DIAGNOSIS — K635 Polyp of colon: Secondary | ICD-10-CM

## 2013-08-16 DIAGNOSIS — E785 Hyperlipidemia, unspecified: Secondary | ICD-10-CM

## 2013-08-16 LAB — CBC WITH DIFFERENTIAL/PLATELET
Basophils Absolute: 0.1 10*3/uL (ref 0.0–0.1)
Basophils Relative: 1 % (ref 0.0–3.0)
EOS ABS: 0.6 10*3/uL (ref 0.0–0.7)
Eosinophils Relative: 10.5 % — ABNORMAL HIGH (ref 0.0–5.0)
HEMATOCRIT: 42 % (ref 36.0–46.0)
HEMOGLOBIN: 14.1 g/dL (ref 12.0–15.0)
LYMPHS ABS: 2.2 10*3/uL (ref 0.7–4.0)
Lymphocytes Relative: 39.5 % (ref 12.0–46.0)
MCHC: 33.5 g/dL (ref 30.0–36.0)
MCV: 93 fl (ref 78.0–100.0)
MONOS PCT: 8.2 % (ref 3.0–12.0)
Monocytes Absolute: 0.5 10*3/uL (ref 0.1–1.0)
NEUTROS ABS: 2.3 10*3/uL (ref 1.4–7.7)
Neutrophils Relative %: 40.8 % — ABNORMAL LOW (ref 43.0–77.0)
PLATELETS: 330 10*3/uL (ref 150.0–400.0)
RBC: 4.51 Mil/uL (ref 3.87–5.11)
RDW: 13.1 % (ref 11.5–14.6)
WBC: 5.6 10*3/uL (ref 4.5–10.5)

## 2013-08-16 LAB — LIPID PANEL
Cholesterol: 244 mg/dL — ABNORMAL HIGH (ref 0–200)
HDL: 60.6 mg/dL (ref >39.00)
LDL Cholesterol: 161 mg/dL — ABNORMAL HIGH (ref 0–99)
TRIGLYCERIDES: 111 mg/dL (ref 0.0–149.0)
Total CHOL/HDL Ratio: 4
VLDL: 22.2 mg/dL (ref 0.0–40.0)

## 2013-08-16 LAB — BASIC METABOLIC PANEL
BUN: 16 mg/dL (ref 6–23)
CO2: 29 meq/L (ref 19–32)
CREATININE: 0.9 mg/dL (ref 0.4–1.2)
Calcium: 9.6 mg/dL (ref 8.4–10.5)
Chloride: 105 mEq/L (ref 96–112)
GFR: 68.77 mL/min (ref >60.00)
Glucose, Bld: 87 mg/dL (ref 70–99)
Potassium: 4.6 mEq/L (ref 3.5–5.1)
SODIUM: 141 meq/L (ref 135–145)

## 2013-08-16 LAB — HEPATIC FUNCTION PANEL
ALBUMIN: 3.9 g/dL (ref 3.5–5.2)
ALT: 28 U/L (ref 0–35)
AST: 24 U/L (ref 0–37)
Alkaline Phosphatase: 57 U/L (ref 39–117)
Bilirubin, Direct: 0 mg/dL (ref 0.0–0.3)
Total Bilirubin: 0.8 mg/dL (ref 0.3–1.2)
Total Protein: 7.2 g/dL (ref 6.0–8.3)

## 2013-08-16 LAB — TSH: TSH: 1.58 u[IU]/mL (ref 0.35–5.50)

## 2013-08-18 ENCOUNTER — Encounter: Payer: Self-pay | Admitting: Internal Medicine

## 2013-08-20 LAB — VITAMIN D 1,25 DIHYDROXY
VITAMIN D 1, 25 (OH) TOTAL: 54 pg/mL (ref 18–72)
VITAMIN D3 1, 25 (OH): 54 pg/mL
Vitamin D2 1, 25 (OH)2: <8 pg/mL

## 2013-08-21 ENCOUNTER — Other Ambulatory Visit: Payer: Self-pay | Admitting: *Deleted

## 2013-08-21 DIAGNOSIS — I83893 Varicose veins of bilateral lower extremities with other complications: Secondary | ICD-10-CM

## 2013-08-22 ENCOUNTER — Encounter: Payer: Self-pay | Admitting: Internal Medicine

## 2013-09-19 ENCOUNTER — Ambulatory Visit (INDEPENDENT_AMBULATORY_CARE_PROVIDER_SITE_OTHER): Payer: Medicare PPO | Admitting: Internal Medicine

## 2013-09-19 ENCOUNTER — Encounter: Payer: Self-pay | Admitting: Internal Medicine

## 2013-09-19 VITALS — BP 110/62 | HR 78 | Temp 98.6°F | Wt 146.4 lb

## 2013-09-19 DIAGNOSIS — M67449 Ganglion, unspecified hand: Secondary | ICD-10-CM

## 2013-09-19 DIAGNOSIS — L723 Sebaceous cyst: Secondary | ICD-10-CM

## 2013-09-19 NOTE — Progress Notes (Signed)
Pre visit review using our clinic review tool, if applicable. No additional management support is needed unless otherwise documented below in the visit note. 

## 2013-09-19 NOTE — Patient Instructions (Signed)
The Hand Surgery referral will be scheduled and you'll be notified of the time. 

## 2013-09-19 NOTE — Progress Notes (Signed)
   Subjective:    Patient ID: Kim Mclaughlin, female    DOB: Sep 07, 1945, 68 y.o.   MRN: [id number]  HPI  She's had a lesion over the DIP joint of the right index finger for the last 3 weeks. There was no definite trigger or injury for this. It has enlarged. Pain is sharp in nature when the lesion is inadvertently bumped. It also hurts when she tries to open a bottle. The pain can be throbbing up to a level VII. It does last seconds.  It has been associated with some redness and swelling   Review of Systems  She specifically  denies any joint stiffness, change in temperature of skin in this area, fever, chills, sweats, numbness, or tingling.  She's had no abnormal lymph nodes       Objective:   Physical Exam  She appears healthy and well-nourished in no distress  She has no lymphadenopathy about the neck, axilla, or epitrochlear areas  Skin reveals no abnormal lesions or rashes  Radial artery pulses are excellent  Nail health is also normal.  She has a 6 x 6 mm mucoid cyst over the DIP joint of the right index finger. This is slightly off midline suggesting that it is not a ganglion cyst.  She has full range of motion with normal strength and tone in the hand        Assessment & Plan:  #1 mucoid cyst over the DIP joint of the right index finger;by history this is enlarging & associated with significant pain with certain mechanical activities  Plan: Referral to hand surgeon

## 2013-10-04 ENCOUNTER — Encounter: Payer: Self-pay | Admitting: Vascular Surgery

## 2013-10-05 ENCOUNTER — Ambulatory Visit (INDEPENDENT_AMBULATORY_CARE_PROVIDER_SITE_OTHER): Payer: Medicare PPO | Admitting: Vascular Surgery

## 2013-10-05 ENCOUNTER — Encounter: Payer: Self-pay | Admitting: Vascular Surgery

## 2013-10-05 ENCOUNTER — Ambulatory Visit (HOSPITAL_COMMUNITY)
Admission: RE | Admit: 2013-10-05 | Discharge: 2013-10-05 | Disposition: A | Discharge status: Home / Self Care | Payer: Medicare PPO | Source: Ambulatory Visit | Attending: Vascular Surgery | Admitting: Vascular Surgery

## 2013-10-05 VITALS — BP 136/62 | HR 76 | Resp 16 | Ht 63.0 in | Wt 145.0 lb

## 2013-10-05 DIAGNOSIS — I872 Venous insufficiency (chronic) (peripheral): Secondary | ICD-10-CM

## 2013-10-05 DIAGNOSIS — I83893 Varicose veins of bilateral lower extremities with other complications: Secondary | ICD-10-CM | POA: Insufficient documentation

## 2013-10-05 NOTE — Progress Notes (Signed)
Referred by:  Hendricks Limes, MD [address],  [id number]  Reason for referral: increased prominence in varicose veins  History of Present Illness  Kim Mclaughlin is a 68 y.o. (06-20-1945) female who presents with chief complaint: increased prominence in varicose veins.  Patient notes, onset of varicose veins decades ago, but over the last few months she has notice increased R varicosity prominence after wearing an ACE bandage around her R knee for arthritic swelling.  The patient's symptoms include: swelling.  The patient has had no history of DVT, known history of pregnancy, known history of varicose vein, no history of venous stasis ulcers, no history of  Lymphedema and no history of skin changes in lower legs.  There is known family history of venous disorders.  The patient has used panty hose high compression stockings in the past.  Past Medical History  Diagnosis Date  . GERD (gastroesophageal reflux disease)   . Hyperlipidemia   . Hepatitis     in 10 th grade  . Osteopenia     [facility]  . Varicose veins     Past Surgical History  Procedure Laterality Date  . Appendectomy    . Tubal ligation    . Stapedectomy    . G 3 p 3    . Dilation and curettage of uterus    . Colonoscopy with polypectomy  07/2012     hyperplasticDr Olevia Perches    History   Social History  . Marital Status: Married    Spouse Name: N/A    Number of Children: N/A  . Years of Education: N/A   Occupational History  . Not on file.   Social History Main Topics  . Smoking status: Former Smoker    Quit date: 04/27/1963  . Smokeless tobacco: Never Used     Comment: Smoked < 1 year  . Alcohol Use: No  . Drug Use: No  . Sexual Activity: Not on file   Other Topics Concern  . Not on file   Social History Narrative  . No narrative on file    Family History  Problem Relation Age of Onset  . Pancreatic cancer Mother   . Miscarriages / [place] Mother   . Thyroid disease  Mother     partial thyroidectomy ; S/P RAI  . Heart attack Father     in early 55s  . Colon cancer Father   . Colon cancer Paternal Grandmother   . Stroke Paternal Grandmother     > 65  . Heart attack Paternal Grandfather     in 42s  . Cancer      among paternal sibs (colon, breast, bone)  . Aneurysm Paternal Uncle     brain  . Ovarian cancer Maternal Aunt   . Diabetes Neg Hx   . Breast cancer Paternal Aunt    Current Outpatient Prescriptions on File Prior to Visit  Medication Sig Dispense Refill  . aspirin 81 MG tablet Take 81 mg by mouth daily.       Marland Kitchen BIOTIN PO Take by mouth.      . calcium citrate-vitamin D 200-200 MG-UNIT TABS Take 1 tablet by mouth daily.      . Cyanocobalamin (VITAMIN B 12 PO) Take by mouth.      . diclofenac sodium (VOLTAREN) 1 % GEL Apply 1 application topically 2 (two) times daily as needed.  1 Tube  2  . fluocinolone (VANOS) 0.01 % cream Apply topically 2 (two)  times daily as needed.       . Multiple Minerals-Vitamins (CAL-MAG-ZINC-D PO) Take by mouth daily.      . Multiple Vitamins-Minerals (MULTIVITAMIN PO) Take 1 tablet by mouth daily.        . Omega-3 Fatty Acids (FISH OIL) 1000 MG CAPS Take by mouth daily.      . Tetrahydrozoline HCl (EYE DROPS OP) Apply to eye. Beprava eye drops 2x daily      . triamcinolone (NASACORT) 55 MCG/ACT AERO nasal inhaler Place 2 sprays into the nose daily.      Marland Kitchen UNABLE TO FIND Cetylpure      . VITAMIN E PO Take by mouth.       No current facility-administered medications on file prior to visit.    Allergies  Allergen Reactions  . Amphetamine-Dextroamphet Er   . Ciprofloxacin     ? reaction; no rash, fever or angioedema  . Codeine     REACTION: nausea  . Indomethacin   . Methylphenidate Hcl   . Pravastatin Sodium     REACTION: shin pain  . Amoxicillin-Pot Clavulanate     GI symptoms; abdominal pain & nausea NOTE: able to take Ampicillin  . Sulfamethoxazole-Trimethoprim     ? nausea     REVIEW OF  SYSTEMS:  (Positives checked otherwise negative)  CARDIOVASCULAR:  []  chest pain, []  chest pressure, []  palpitations, []  shortness of breath when laying flat, []  shortness of breath with exertion,  [x]  pain in feet when walking, []  pain in feet when laying flat, [x]  history of blood clot in veins (DVT), []  history of phlebitis, []  swelling in legs, [x]  varicose veins  PULMONARY:  []  productive cough, []  asthma, []  wheezing  NEUROLOGIC:  []  weakness in arms or legs, []  numbness in arms or legs, []  difficulty speaking or slurred speech, [x]  temporary loss of vision in one eye, []  dizziness  HEMATOLOGIC:  []  bleeding problems, []  problems with blood clotting too easily  MUSCULOSKEL:  []  joint pain, []  joint swelling  GASTROINTEST:  []  vomiting blood, []  blood in stool     GENITOURINARY:  []  burning with urination, []  blood in urine  PSYCHIATRIC:  []  history of major depression  INTEGUMENTARY:  []  rashes, []  ulcers  CONSTITUTIONAL:  []  fever, []  chills   Physical Examination Filed Vitals:   10/05/13 1130  BP: 136/62  Pulse: 76  Resp: 16  Height: 5\' 3"  (1.6 m)  Weight: 145 lb (65.772 kg)   Body mass index is 25.69 kg/(m^2).  General: A&O x 3, WDWN  Head: [place]/AT  Ear/Nose/Throat: Hearing grossly intact, nares w/o erythema or drainage, oropharynx w/o Erythema/Exudate  Eyes: PERRLA, EOMI  Neck: Supple, no nuchal rigidity, no palpable LAD  Pulmonary: Sym exp, good air movt, CTAB, no rales, rhonchi, & wheezing  Cardiac: RRR, Nl S1, S2, no Murmurs, rubs or gallops  Vascular: Vessel Right Left  Radial Palpable Palpable  Brachial Palpable Palpable  Carotid Palpable, without bruit Palpable, without bruit  Aorta Not palpable N/A  Femoral Palpable Palpable  Popliteal Not palpable Not palpable  PT Palpable Palpable  DP Palpable Palpable   Gastrointestinal: soft, NTND, -G/R, - HSM, - masses, - CVAT B, no palpable AAA  Musculoskeletal: M/S 5/5 throughout , Extremities without  ischemic changes , BLE visible varicose veins (R>L), BLE spider veins, mild L LDS  Neurologic: CN 2-12 intact , Pain and light touch intact in extremities , Motor exam as listed above  Psychiatric: Judgment intact, Mood & affect  appropriate for pt's clinical situation  Dermatologic: See M/S exam for extremity exam, no rashes otherwise noted  Lymph : No Cervical, Axillary, or Inguinal lymphadenopathy   Non-Invasive Vascular Imaging  BLE Venous Insufficiency Duplex (Date: 10/05/2013):   RLE: no DVT and SVT, + GSV reflux, + deep venous reflux: FV, pop V  LLE: no DVT and SVT, + GSV reflux, + deep venous reflux: CFV, FV, and pop V  Outside Studies/Documentation 4 pages of outside documents were reviewed including: outpatient chart.  Medical Decision Making  Kim Mclaughlin is a 68 y.o. female who presents with: BLE chronic venous insufficiency (C4), sx varicose veins   Based on the patient's history and examination, I recommend: compressive therapy.  I discussed with the patient the use of her 20-30 mm thigh high compression stockings and need for 3 month trial of such.  Pt prefers pantyhose high stockings  The patient will follow up in 3 months with my partners in the [facility] for evaluation for: EVLA R GSV, possible EVLA L GSV.  Thank you for allowing [place] to participate in this patient's care.  Adele Barthel, MD [facility] Office: [phone] Pager: [phone]  10/05/2013, 12:43 PM

## 2013-10-10 ENCOUNTER — Telehealth: Payer: Self-pay

## 2013-10-10 NOTE — Telephone Encounter (Signed)
Message copied by Shelly Coss on Wed Oct 10, 2013  2:23 PM ------      Message from: Hendricks Limes      Created: Wed Oct 10, 2013 12:47 PM       Please see message from patient.            Please pursue coverage for parenteral osteoporosis agent through her primary, [employer].            It looks like it's Forteo; please verify that. ------

## 2013-12-03 ENCOUNTER — Encounter: Payer: Self-pay | Admitting: Internal Medicine

## 2013-12-04 ENCOUNTER — Telehealth: Payer: Self-pay

## 2013-12-04 NOTE — Telephone Encounter (Signed)
Received a fax back from [facility] short stay. Patient labs are out of date. Last labs on 08/16/13.

## 2013-12-04 NOTE — Telephone Encounter (Signed)
Prior authorization for Reclast has been submitted via cover my meds

## 2013-12-04 NOTE — Telephone Encounter (Signed)
Reclast ( see Problem List dx of Osteoporosis

## 2013-12-04 NOTE — Telephone Encounter (Signed)
I am trying to start prior authorization on Forteo. I do not see this on patient's medication list. When filling out the prior authorization form it's asking for quantity and days supply. I do not know what to put. Please advise.

## 2013-12-04 NOTE — Telephone Encounter (Signed)
I called patient to let her know I will submit prior authorization for reclast. I gave patient the phone number to [facility] short stay [phone]. I will fax the form to that office also [phone]

## 2013-12-04 NOTE — Telephone Encounter (Signed)
I do not know what to start a prior authorization on

## 2013-12-04 NOTE — Telephone Encounter (Signed)
Patient sent another my chart message mentioning Reclast. What is patient to take?

## 2013-12-05 ENCOUNTER — Other Ambulatory Visit: Payer: Self-pay | Admitting: Internal Medicine

## 2013-12-05 DIAGNOSIS — T887XXA Unspecified adverse effect of drug or medicament, initial encounter: Secondary | ICD-10-CM

## 2013-12-05 DIAGNOSIS — M810 Age-related osteoporosis without current pathological fracture: Secondary | ICD-10-CM

## 2013-12-05 NOTE — Telephone Encounter (Signed)
Phone call to patient letting her know she needs labs (orders are entered). I let her know labs hours and she does not need an appointment. She will call back to schedule an office visit with Dr Linna Darner.

## 2013-12-05 NOTE — Telephone Encounter (Signed)
She needs labs (entered ) & F/U OV. She's anxious about Reclast ( see her My Chart message of several paragraphs)

## 2013-12-06 ENCOUNTER — Other Ambulatory Visit (INDEPENDENT_AMBULATORY_CARE_PROVIDER_SITE_OTHER): Payer: Medicare PPO

## 2013-12-06 DIAGNOSIS — M810 Age-related osteoporosis without current pathological fracture: Secondary | ICD-10-CM

## 2013-12-06 DIAGNOSIS — T887XXA Unspecified adverse effect of drug or medicament, initial encounter: Secondary | ICD-10-CM

## 2013-12-06 LAB — BASIC METABOLIC PANEL
BUN: 16 mg/dL (ref 6–23)
CO2: 28 mEq/L (ref 19–32)
Calcium: 10 mg/dL (ref 8.4–10.5)
Chloride: 103 mEq/L (ref 96–112)
Creatinine, Ser: 1 mg/dL (ref 0.4–1.2)
GFR: 59.89 mL/min — ABNORMAL LOW (ref >60.00)
GLUCOSE: 83 mg/dL (ref 70–99)
Potassium: 4.2 mEq/L (ref 3.5–5.1)
Sodium: 141 mEq/L (ref 135–145)

## 2013-12-10 ENCOUNTER — Telehealth: Payer: Self-pay

## 2013-12-10 NOTE — Telephone Encounter (Signed)
Received a fax stating reclast has been approved under patient's Medicare part B

## 2013-12-10 NOTE — Telephone Encounter (Signed)
Fax received. PA for rexlast was denied.

## 2013-12-11 ENCOUNTER — Encounter: Payer: Self-pay | Admitting: Internal Medicine

## 2013-12-11 ENCOUNTER — Ambulatory Visit (INDEPENDENT_AMBULATORY_CARE_PROVIDER_SITE_OTHER): Payer: Medicare PPO | Admitting: Internal Medicine

## 2013-12-11 VITALS — BP 140/70 | HR 68 | Temp 98.3°F | Wt 147.0 lb

## 2013-12-11 DIAGNOSIS — M810 Age-related osteoporosis without current pathological fracture: Secondary | ICD-10-CM

## 2013-12-11 DIAGNOSIS — F988 Other specified behavioral and emotional disorders with onset usually occurring in childhood and adolescence: Secondary | ICD-10-CM

## 2013-12-11 NOTE — Patient Instructions (Signed)
Please do the mental exercise we discussed over 5-7  days and spiritual consideration for 24 hours. Whatever you decide will be the correct answer.

## 2013-12-11 NOTE — Progress Notes (Signed)
Pre visit review using our clinic review tool, if applicable. No additional management support is needed unless otherwise documented below in the visit note. 

## 2013-12-11 NOTE — Progress Notes (Signed)
   Subjective:    Patient ID: Kim Mclaughlin, female    DOB: 01-14-1946, 68 y.o.   MRN: [id number]  HPI  She is here to discuss her most recent bone density 06/18/13. This now demonstrates osteoporosis of the right femoral neck with a T score of -2.6. Compared to 04/09/12 there has been of -2.3% loss in bone density at the right femoral neck. The left femoral neck is stable.  There has been a -5.8% loss in the spine but the T score is still normal at - 0.1.  She has taken bisphosphonates on 2 occasions but both times the duration of therapy was less than a year. A relative in the medical field I advised her against taking these.  Her vitamin D level is therapeutic. She is on calcium supplement of 1000 mg per day as well.  Heel density was normal on screening study; the insignificance of this was explained.  "People on the Internet say Reclast is poison". Data & Patient Information in Up to Date reviewed as were actual BMD data & images.   She previously was walking; this was interrupted approximately month ago.  There is a history of pathologic fracture of the hip in a maternal grandmother.  [employer], her drug coverage has notified her there is Medicare prescription drug coverage for Reclast. Apparently Medicare part D has denied request, however Medicare part B has improved coverage for 2 years. They do not have Medicare drug coverage as her husband has [employer].  She has a past medical history of ulcer in her 44s. She has no symptoms of active reflux.   Review of Systems  Unexplained weight loss, abdominal pain, significant dyspepsia, dysphagia, melena, rectal bleeding, or persistently small caliber stools are denied.     Objective:   Physical Exam no exam; reviewing data & discussing risks & options took 35 minutes        Assessment & Plan:  See Current Assessment & Plan in Problem List under specific Diagnosis.

## 2013-12-11 NOTE — Assessment & Plan Note (Addendum)
Up to Date reviewed BMD reviewed; risks & options discussed

## 2013-12-12 NOTE — Assessment & Plan Note (Signed)
This is an issue in getting her to focus on data ,not rumor or hype concerning Reclast & come to some decision

## 2014-01-07 ENCOUNTER — Encounter: Payer: Self-pay | Admitting: Vascular Surgery

## 2014-01-08 ENCOUNTER — Ambulatory Visit (INDEPENDENT_AMBULATORY_CARE_PROVIDER_SITE_OTHER): Payer: Medicare PPO | Admitting: Vascular Surgery

## 2014-01-08 ENCOUNTER — Encounter: Payer: Self-pay | Admitting: Vascular Surgery

## 2014-01-08 VITALS — BP 135/76 | HR 67 | Resp 18 | Ht 63.0 in | Wt 145.3 lb

## 2014-01-08 DIAGNOSIS — I83893 Varicose veins of bilateral lower extremities with other complications: Secondary | ICD-10-CM

## 2014-01-08 NOTE — Progress Notes (Signed)
Problems with Activities of Daily Living Secondary to Leg Pain  1. Kim Mclaughlin states that yard work is very difficult for her due to leg pain.    2. Kim Mclaughlin states that she walks for exercise and this is difficult for her due to leg pain.       Failure of  Conservative Therapy:  1. Worn 20-30 mm Hg thigh high compression hose >3 months with no relief of symptoms.  2. Frequently elevates legs-no relief of symptoms  3. Taken Ibuprofen 600 Mg TID with no relief of symptoms.  Here today for continued followup of her bilateral lower extremity venous hypertension. She is here today with her husband. She has been compliant with compression garments but continues to have discomfort related to her varicosities. On the left leg she has marked changes of venous hypertension around her ankle with matting of tributary and superficial telangiectasia in her circumferentially around her ankle and dorsum of her foot. On the right side she has marked varicosities in her medial thigh and popliteal space. She does have palpable pedal pulses bilaterally  I reviewed her venous Doppler study from 10/05/2013 and also reimage her veins with SonoSite ultrasound. This does show significant reflux in her saphenous veins bilaterally.  Impression and plan failed conservative treatment of bilateral venous hypertension. Have discussed staged bilateral treatment. She would require ablation of her left great saphenous vein and subsequent frequent require additional her right great saphenous vein and staged phlebectomy of her multiple varicosities. She wished to proceed with the treatment initially of her left leg and then a followup with the right leg treatment. We will schedule this at her convenience. She understands under local anesthesia her office

## 2014-01-15 ENCOUNTER — Encounter (INDEPENDENT_AMBULATORY_CARE_PROVIDER_SITE_OTHER): Payer: Self-pay

## 2014-01-17 ENCOUNTER — Other Ambulatory Visit: Payer: Self-pay | Admitting: *Deleted

## 2014-01-17 DIAGNOSIS — I83893 Varicose veins of bilateral lower extremities with other complications: Secondary | ICD-10-CM

## 2014-01-18 ENCOUNTER — Encounter: Payer: Self-pay | Admitting: Internal Medicine

## 2014-01-18 ENCOUNTER — Telehealth: Payer: Self-pay | Admitting: Vascular Surgery

## 2014-01-18 NOTE — Telephone Encounter (Addendum)
Message copied by Gena Fray on Fri Jan 18, 2014  9:10 AM ------      Message from: Norberto Sorenson D      Created: Thu Jan 17, 2014  5:16 PM      Regarding: scheduling       Please schedule Kim Mclaughlin for post laser ablation duplex (left leg, order in EPIC) and VV FU with Dr. Donnetta Hutching on 02-07-2014.  Thanks! ------  02/07/14 @ 9:30 Lab and 10:15 TFE

## 2014-01-22 ENCOUNTER — Other Ambulatory Visit: Payer: Self-pay | Admitting: *Deleted

## 2014-01-22 DIAGNOSIS — I83893 Varicose veins of bilateral lower extremities with other complications: Secondary | ICD-10-CM

## 2014-01-23 ENCOUNTER — Telehealth: Payer: Self-pay | Admitting: Vascular Surgery

## 2014-01-23 NOTE — Telephone Encounter (Signed)
Message copied by Gena Fray on Wed Jan 23, 2014  1:55 PM ------      Message from: Norberto Sorenson D      Created: Tue Jan 22, 2014  4:00 PM      Regarding: scheduling       Please schedule Joshua Zeringue for post LA duplex ( right leg, order in EPIC) and VV FU with Dr. Kellie Simmering on 03-19-2014.  Dr. Donnetta Hutching is out-of-town that week.  Thanks!  ------

## 2014-01-27 ENCOUNTER — Encounter: Payer: Self-pay | Admitting: Internal Medicine

## 2014-01-28 ENCOUNTER — Encounter: Payer: Self-pay | Admitting: Vascular Surgery

## 2014-01-28 NOTE — Telephone Encounter (Signed)
Mammogram updated per patient request

## 2014-01-30 ENCOUNTER — Encounter: Payer: Self-pay | Admitting: Vascular Surgery

## 2014-01-31 ENCOUNTER — Encounter: Payer: Self-pay | Admitting: Vascular Surgery

## 2014-01-31 ENCOUNTER — Ambulatory Visit (INDEPENDENT_AMBULATORY_CARE_PROVIDER_SITE_OTHER): Payer: Medicare PPO | Admitting: Vascular Surgery

## 2014-01-31 VITALS — BP 119/70 | HR 73 | Resp 16 | Ht 63.0 in | Wt 144.0 lb

## 2014-01-31 DIAGNOSIS — I83899 Varicose veins of unspecified lower extremities with other complications: Secondary | ICD-10-CM | POA: Insufficient documentation

## 2014-01-31 DIAGNOSIS — I83892 Varicose veins of left lower extremities with other complications: Secondary | ICD-10-CM

## 2014-01-31 HISTORY — PX: ENDOVENOUS ABLATION SAPHENOUS VEIN W/ LASER: SUR449

## 2014-01-31 NOTE — Progress Notes (Signed)
   Laser Ablation Procedure      Date: 01/31/2014    Kim Mclaughlin DOB:[birth year]  Consent signed: Yes  Surgeon:T.F. Jekhi Bolin  Procedure: Laser Ablation: left Greater Saphenous Vein  BP 119/70  Pulse 73  Resp 16  Ht 5\' 3"  (1.6 m)  Wt 144 lb (65.318 kg)  BMI 25.51 kg/m2  Start time: 830   End time: 910  Tumescent Anesthesia: 450 cc 0.9% NaCl with 50 cc Lidocaine HCL with 1% Epi and 15 cc 8.4% NaHCO3  Local Anesthesia: 3 cc Lidocaine HCL and NaHCO3 (ratio 2:1)  Pulsed mode:15 watts, 551ms delay, 1.0 duration and Total energy: 2654, Total pulses: 1, Total time: 2:57      Patient tolerated procedure well: Yes  Notes: Treated from below knee to 2cm below SFJ  Description of Procedure:  After marking the course of the secondary varicosities, the patient was placed on the operating table in the supine position, and the left leg was prepped and draped in sterile fashion.   Local anesthetic was administered and under ultrasound guidance the saphenous vein was accessed with a micro needle and guide wire; then the mirco puncture sheath was place.  A guide wire was inserted saphenofemoral junction , followed by a 5 french sheath.  The position of the sheath and then the laser fiber below the junction was confirmed using the ultrasound.  Tumescent anesthesia was administered along the course of the saphenous vein using ultrasound guidance. The patient was placed in Trendelenburg position and protective laser glasses were placed on patient and staff, and the laser was fired at at 15 watt continuous mode for a total of 2654 joules.   Steri strips were applied to the stab wounds and ABD pads and thigh high compression stockings were applied.  Ace wrap bandages were applied over the phlebectomy sites and at the top of the saphenofemoral junction. Blood loss was less than 15 cc.  The patient ambulated out of the operating room having tolerated the procedure well.

## 2014-02-05 ENCOUNTER — Telehealth: Payer: Self-pay | Admitting: *Deleted

## 2014-02-05 NOTE — Telephone Encounter (Signed)
    02/05/2014  Time: 9:05 AM   Patient Name: Kim Mclaughlin  Patient of: T.F. Early  Procedure:Laser Ablation left greater saphenous vein 01-31-2014    Reached patient at home and checked  Her status  Yes    Comments/Actions Taken: Mrs. Lindo states no problems with left leg pain or swelling.  Reviewed post procedural instructions with her and reminded her of post laser ablation duplex and VV FU with Dr. Donnetta Hutching on 02-07-2014.       @SIGNATURE @

## 2014-02-06 ENCOUNTER — Encounter: Payer: Self-pay | Admitting: Vascular Surgery

## 2014-02-07 ENCOUNTER — Encounter: Payer: Self-pay | Admitting: Vascular Surgery

## 2014-02-07 ENCOUNTER — Ambulatory Visit (HOSPITAL_COMMUNITY)
Admission: RE | Admit: 2014-02-07 | Discharge: 2014-02-07 | Disposition: A | Discharge status: Home / Self Care | Payer: Medicare PPO | Source: Ambulatory Visit | Attending: Vascular Surgery | Admitting: Vascular Surgery

## 2014-02-07 ENCOUNTER — Ambulatory Visit (INDEPENDENT_AMBULATORY_CARE_PROVIDER_SITE_OTHER): Payer: Medicare PPO | Admitting: Vascular Surgery

## 2014-02-07 VITALS — BP 119/71 | HR 71 | Resp 14 | Ht 63.0 in | Wt 145.0 lb

## 2014-02-07 DIAGNOSIS — I8393 Asymptomatic varicose veins of bilateral lower extremities: Secondary | ICD-10-CM | POA: Diagnosis present

## 2014-02-07 DIAGNOSIS — I83892 Varicose veins of left lower extremities with other complications: Secondary | ICD-10-CM

## 2014-02-07 NOTE — Progress Notes (Signed)
Today for followup of her left foot great saphenous vein laser ablation 1 week ago. She's had minimal discomfort associated with this and minimal bruising. She has been compliant with her compression  On physical exam she does have mild bruising on the medial distal thigh and otherwise no bruising at all. No skin irritation. She has less engorgement of the small reticular to around her left ankle  Duplex today reveals closure of her great saphenous vein from the distal insertion site in her calf 21.5 cm from the saphenofemoral junction. There is no evidence of DVT  Impression and plan stable followup after her left great saphenous vein ablation. Will continue her compression for one week. We'll see her again on November 19 for right leg ablation

## 2014-03-13 ENCOUNTER — Encounter: Payer: Self-pay | Admitting: Vascular Surgery

## 2014-03-14 ENCOUNTER — Ambulatory Visit (INDEPENDENT_AMBULATORY_CARE_PROVIDER_SITE_OTHER): Payer: Medicare PPO | Admitting: Vascular Surgery

## 2014-03-14 ENCOUNTER — Encounter: Payer: Self-pay | Admitting: Vascular Surgery

## 2014-03-14 VITALS — BP 132/73 | HR 73 | Resp 18 | Ht 63.0 in | Wt 145.0 lb

## 2014-03-14 DIAGNOSIS — I83891 Varicose veins of right lower extremities with other complications: Secondary | ICD-10-CM

## 2014-03-14 HISTORY — PX: ENDOVENOUS ABLATION SAPHENOUS VEIN W/ LASER: SUR449

## 2014-03-14 NOTE — Progress Notes (Signed)
   Laser Ablation Procedure      Date: 03/14/2014    Kim Mclaughlin DOB:[birth year]  Consent signed: Yes  Surgeon:T.F. Kinleigh Nault  Procedure: Laser Ablation: right Greater Saphenous Vein  BP 132/73 mmHg  Pulse 73  Resp 18  Ht 5\' 3"  (1.6 m)  Wt 145 lb (65.772 kg)  BMI 25.69 kg/m2  Start time: 845   End time: 915  Tumescent Anesthesia: 300 cc 0.9% NaCl with 50 cc Lidocaine HCL with 1% Epi and 15 cc 8.4% NaHCO3  Local Anesthesia: 2 cc Lidocaine HCL and NaHCO3 (ratio 2:1)  Pulsed mode:15 watts, 551ms delay, 1.0 duration and Total energy: 1403, Total pulses: 1, Total time: 1:33     Patient tolerated procedure well: Yes  Notes: The patient had ablation of her great saphenous vein from distal third for thigh to the just below the saphenofemoral junction. This was an area of her large perforator arising with competent saphenous vein below this incompetent above.  Description of Procedure:  After marking the course of the secondary varicosities, the patient was placed on the operating table in the supine position, and the right leg was prepped and draped in sterile fashion.   Local anesthetic was administered and under ultrasound guidance the saphenous vein was accessed with a micro needle and guide wire; then the mirco puncture sheath was place.  A guide wire was inserted saphenofemoral junction , followed by a 5 french sheath.  The position of the sheath and then the laser fiber below the junction was confirmed using the ultrasound.  Tumescent anesthesia was administered along the course of the saphenous vein using ultrasound guidance. The patient was placed in Trendelenburg position and protective laser glasses were placed on patient and staff, and the laser was fired at at 15 watt continuous mode for a total of 1403 joules.   Diamond [place]el strips were applied to the stab wounds and ABD pads and thigh high compression stockings were applied.  Ace wrap bandages were applied over the  phlebectomy sites and at the top of the saphenofemoral junction. Blood loss was less than 15 cc.  The patient ambulated out of the operating room having tolerated the procedure well.

## 2014-03-18 ENCOUNTER — Encounter: Payer: Self-pay | Admitting: Vascular Surgery

## 2014-03-19 ENCOUNTER — Encounter: Payer: Self-pay | Admitting: Vascular Surgery

## 2014-03-19 ENCOUNTER — Ambulatory Visit (INDEPENDENT_AMBULATORY_CARE_PROVIDER_SITE_OTHER): Payer: Medicare PPO | Admitting: Vascular Surgery

## 2014-03-19 ENCOUNTER — Ambulatory Visit (HOSPITAL_COMMUNITY)
Admission: RE | Admit: 2014-03-19 | Discharge: 2014-03-19 | Disposition: A | Discharge status: Home / Self Care | Payer: Medicare PPO | Source: Ambulatory Visit | Attending: Vascular Surgery | Admitting: Vascular Surgery

## 2014-03-19 ENCOUNTER — Telehealth: Payer: Self-pay | Admitting: *Deleted

## 2014-03-19 VITALS — BP 132/83 | HR 77 | Ht 63.0 in | Wt 147.0 lb

## 2014-03-19 DIAGNOSIS — I8393 Asymptomatic varicose veins of bilateral lower extremities: Secondary | ICD-10-CM | POA: Diagnosis present

## 2014-03-19 DIAGNOSIS — I83893 Varicose veins of bilateral lower extremities with other complications: Secondary | ICD-10-CM

## 2014-03-19 NOTE — Progress Notes (Signed)
Subjective:     Patient ID: Kim Mclaughlin, female   DOB: 13-Jun-1945, 68 y.o.   MRN: [id number]  HPI this 68 year old female returns 1 week post laser ablation right great saphenous vein performed by Dr. early under local tumescent anesthesia for painful varicosities. She had some mild tenderness along the course of the mid to proximal great saphenous vein. She treated this with heat, ice, and ibuprofen and it has resolved. She did wear a long-leg elastic compression stockings as instructed.  Past Medical History  Diagnosis Date  . GERD (gastroesophageal reflux disease)   . Hyperlipidemia   . Hepatitis     in 10 th grade  . Osteopenia     [place]  . Varicose veins     History  Substance Use Topics  . Smoking status: Former Smoker    Quit date: 04/27/1963  . Smokeless tobacco: Never Used     Comment: Smoked < 1 year  . Alcohol Use: No    Family History  Problem Relation Age of Onset  . Pancreatic cancer Mother   . Miscarriages / [place] Mother   . Thyroid disease Mother     partial thyroidectomy ; S/P RAI  . Heart attack Father     in early 20s  . Colon cancer Father   . Colon cancer Paternal Grandmother   . Stroke Paternal Grandmother     > 65  . Heart attack Paternal Grandfather     in 3s  . Cancer      among paternal sibs (colon, breast, bone)  . Aneurysm Paternal Uncle     brain  . Ovarian cancer Maternal Aunt   . Diabetes Neg Hx   . Breast cancer Paternal Aunt     Allergies  Allergen Reactions  . Amphetamine-Dextroamphet Er   . Ciprofloxacin     ? reaction; no rash, fever or angioedema  . Codeine     REACTION: nausea  . Indomethacin   . Methylphenidate Hcl   . Pravastatin Sodium     REACTION: shin pain  . Amoxicillin-Pot Clavulanate     GI symptoms; abdominal pain & nausea NOTE: able to take Ampicillin  . Sulfamethoxazole-Trimethoprim     ? nausea    Current outpatient prescriptions: aspirin 81 MG tablet, Take 81 mg by mouth daily. , Disp: ,  Rfl: ;  BIOTIN PO, Take by mouth., Disp: , Rfl: ;  calcium citrate-vitamin D 200-200 MG-UNIT TABS, Take 4 tablets by mouth daily. , Disp: , Rfl: ;  Cyanocobalamin (VITAMIN B 12 PO), Take by mouth., Disp: , Rfl: ;  diclofenac sodium (VOLTAREN) 1 % GEL, Apply 1 application topically 2 (two) times daily as needed., Disp: 1 Tube, Rfl: 2 fluocinolone (VANOS) 0.01 % cream, Apply topically 2 (two) times daily as needed. , Disp: , Rfl: ;  Multiple Minerals-Vitamins (CAL-MAG-ZINC-D PO), Take by mouth daily., Disp: , Rfl: ;  Multiple Vitamins-Minerals (MULTIVITAMIN PO), Take 1 tablet by mouth daily.  , Disp: , Rfl: ;  Omega-3 Fatty Acids (FISH OIL) 1000 MG CAPS, Take by mouth daily., Disp: , Rfl:  Tetrahydrozoline HCl (EYE DROPS OP), Apply to eye. Beprava eye drops 2x daily, Disp: , Rfl: ;  triamcinolone (NASACORT) 55 MCG/ACT AERO nasal inhaler, Place 2 sprays into the nose daily., Disp: , Rfl: ;  VITAMIN E PO, Take by mouth., Disp: , Rfl:   BP 132/83 mmHg  Pulse 77  Ht 5\' 3"  (1.6 m)  Wt 147 lb (66.679 kg)  BMI 26.05 kg/m2  SpO2 99%  Body mass index is 26.05 kg/(m^2).           Review of Systems denies chest pain, dyspnea on exertion, PND, orthopnea, hemoptysis.     Objective:   Physical Exam BP 132/83 mmHg  Pulse 77  Ht 5\' 3"  (1.6 m)  Wt 147 lb (66.679 kg)  BMI 26.05 kg/m2  SpO2 99%  Gen. well-developed well-nourished female in no apparent stress alert and oriented 3 Right leg with mild tenderness along course of proximal great saphenous vein with no erythema or blistering. 1+ chronic edema. 2+ dorsalis pedis pulse palpable.  Today I ordered a venous duplex exam of the right leg which I reviewed and interpreted. There is no DVT. The right great saphenous vein is closed from the mid thigh up to near the saphenofemoral junction     Assessment:     Successful bilateral closure great saphenous vein using laser ablation by Dr. early    Plan:     Return in 3 months to evaluate for  possible stab phlebectomy and/or sclerotherapy of painful varicosities

## 2014-03-19 NOTE — Telephone Encounter (Signed)
    03/19/2014  Time: 12:29 PM   Patient Name: Kim Mclaughlin  Patient of: T.F. Early  Procedure:Laser Ablation right greater saphenous vein 03-14-2014  Reached patient at home and checked  Her status  Yes    Comments/Actions Taken: [name] states no problems with right leg pain or swelling.  Reviewed post procedural instructions with her and reminded her of post laser ablation duplex and VV FU on 03-19-2014 with Dr. Kellie Simmering (as Dr. Donnetta Hutching is out-of-town).      @SIGNATURE @

## 2014-03-29 ENCOUNTER — Other Ambulatory Visit: Payer: Self-pay | Admitting: Vascular Surgery

## 2014-03-29 ENCOUNTER — Ambulatory Visit (INDEPENDENT_AMBULATORY_CARE_PROVIDER_SITE_OTHER): Payer: Self-pay | Admitting: Vascular Surgery

## 2014-03-29 ENCOUNTER — Ambulatory Visit (HOSPITAL_COMMUNITY)
Admission: RE | Admit: 2014-03-29 | Discharge: 2014-03-29 | Disposition: A | Discharge status: Home / Self Care | Payer: Medicare PPO | Source: Ambulatory Visit | Attending: Vascular Surgery | Admitting: Vascular Surgery

## 2014-03-29 ENCOUNTER — Encounter: Payer: Self-pay | Admitting: Vascular Surgery

## 2014-03-29 VITALS — BP 151/79 | HR 73 | Ht 63.0 in | Wt 146.6 lb

## 2014-03-29 DIAGNOSIS — I824Z1 Acute embolism and thrombosis of unspecified deep veins of right distal lower extremity: Secondary | ICD-10-CM

## 2014-03-29 DIAGNOSIS — I824Z9 Acute embolism and thrombosis of unspecified deep veins of unspecified distal lower extremity: Secondary | ICD-10-CM | POA: Insufficient documentation

## 2014-03-29 DIAGNOSIS — M79604 Pain in right leg: Secondary | ICD-10-CM

## 2014-03-29 NOTE — Progress Notes (Signed)
    Established Venous Insufficiency  History of Present Illness  Kim Mclaughlin is a 68 y.o. (October 07, 1945) female who presents with chief complaint: right calf pain and swelling.  The patient's symptoms noticed increased R calf pain and swellling recently, aggravated by her compression stockings.  The patient's treatment regimen currently included: maximal medical management and compression stockings.  The patient's PMH, PSH, SH, FamHx, Med, and Allergies are unchanged from 01/31/14.  On ROS today: no claudication, no pulm sx  Physical Examination  Filed Vitals:   03/29/14 1132  BP: 151/79  Pulse: 73  Height: 5\' 3"  (1.6 m)  Weight: 146 lb 9.6 oz (66.497 kg)  SpO2: 99%   Body mass index is 25.98 kg/(m^2).  General: A&O x 3, WDWN  Musculoskeletal: M/S 5/5 throughout , R leg with sterile strips in place, clusters of spider veins evident, likely thrombosed cluster of varicosities behind R medial knee  Non-Invasive Vascular Imaging  RLE Venous Duplex (Date: 03/29/2014):   R gastrocnemius vein DVT  GSV ablated   Medical Decision Making  Kim Mclaughlin is a 68 y.o. female who presents with: R calf DVT, s/p R GSV EVLA   Most of this patient's sx are actually SVT related to her R GSV EVLA.  Pt's R calf DVT is unrelated to her R GSV EVLA  We discussed the controversy in regards to management of calf DVT.  In pt that are active and can wear their compression, I don't usually start anticoagulation, rather I continue compressive therapy with ambulation and repeat the venous duplex in 2 weeks, as the natural history of calf DVT is spontaneous lysis.  I also offered her anticoagulation but at this time she is going to proceed with compression and repeat evaluation in 2 weeks.  She will follow up with Dr. Donnetta Hutching for that evaluation  Thank you for allowing [place] to participate in this patient's care.  Adele Barthel, MD [facility] Office:  [phone] Pager: [phone]  03/29/2014, 12:08 PM

## 2014-04-01 ENCOUNTER — Encounter: Payer: Self-pay | Admitting: Family

## 2014-04-01 ENCOUNTER — Ambulatory Visit (INDEPENDENT_AMBULATORY_CARE_PROVIDER_SITE_OTHER): Payer: Medicare PPO | Admitting: Family

## 2014-04-01 VITALS — BP 142/78 | HR 72 | Temp 97.7°F | Resp 18 | Ht 63.0 in | Wt 147.0 lb

## 2014-04-01 DIAGNOSIS — I824Z1 Acute embolism and thrombosis of unspecified deep veins of right distal lower extremity: Secondary | ICD-10-CM

## 2014-04-01 NOTE — Assessment & Plan Note (Addendum)
Appears stable at present. Symptoms improved since previous visit with vascular surgery. He continues to wear the stockings. Discussed anticoagulation with patient and her husband, and would still like to hold off at this time. Discussed signs and symptoms of worsening condition and PE. Keep follow up with vascular surgery on December 18. Follow up if symptoms worsen or fail to improve.

## 2014-04-01 NOTE — Patient Instructions (Addendum)
Thank you for choosing [facility].  Summary/Instructions:  If your symptoms worsen or fail to improve, please contact our office for further instruction, or in case of emergency go directly to the emergency room at the closest medical facility.   Deep Vein Thrombosis A deep vein thrombosis (DVT) is a blood clot that develops in the deep, larger veins of the leg, arm, or pelvis. These are more dangerous than clots that might form in veins near the surface of the body. A DVT can lead to serious and even life-threatening complications if the clot breaks off and travels in the bloodstream to the lungs.  A DVT can damage the valves in your leg veins so that instead of flowing upward, the blood pools in the lower leg. This is called post-thrombotic syndrome, and it can result in pain, swelling, discoloration, and sores on the leg. CAUSES Usually, several things contribute to the formation of blood clots. Contributing factors include:  The flow of blood slows down.  The inside of the vein is damaged in some way.  You have a condition that makes blood clot more easily. RISK FACTORS Some people are more likely than others to develop blood clots. Risk factors include:   Smoking.  Being overweight (obese).  Sitting or lying still for a long time. This includes long-distance travel, paralysis, or recovery from an illness or surgery. Other factors that increase risk are:   Older age, especially over 20 years of age.  Having a family history of blood clots or if you have already had a blot clot.  Having major or lengthy surgery. This is especially true for surgery on the hip, knee, or belly (abdomen). Hip surgery is particularly high risk.  Having a long, thin tube (catheter) placed inside a vein during a medical procedure.  Breaking a hip or leg.  Having cancer or cancer treatment.  Pregnancy and childbirth.  Hormone changes make the blood clot more easily during pregnancy.  The  fetus puts pressure on the veins of the pelvis.  There is a risk of injury to veins during delivery or a caesarean delivery. The risk is highest just after childbirth.  Medicines containing the female hormone estrogen. This includes birth control pills and hormone replacement therapy.  Other circulation or heart problems.  SIGNS AND SYMPTOMS When a clot forms, it can either partially or totally block the blood flow in that vein. Symptoms of a DVT can include:  Swelling of the leg or arm, especially if one side is much worse.  Warmth and redness of the leg or arm, especially if one side is much worse.  Pain in an arm or leg. If the clot is in the leg, symptoms may be more noticeable or worse when standing or walking. The symptoms of a DVT that has traveled to the lungs (pulmonary embolism, PE) usually start suddenly and include:  Shortness of breath.  Coughing.  Coughing up blood or blood-tinged mucus.  Chest pain. The chest pain is often worse with deep breaths.  Rapid heartbeat. Anyone with these symptoms should get emergency medical treatment right away. Do not wait to see if the symptoms will go away. Call your local emergency services (911 in the [place] if you have these symptoms. Do not drive yourself to the hospital. DIAGNOSIS If a DVT is suspected, your health care provider will take a full medical history and perform a physical exam. Tests that also may be required include:  Blood tests, including studies of the clotting  properties of the blood.  Ultrasound to see if you have clots in your legs or lungs.  X-rays to show the flow of blood when dye is injected into the veins (venogram).  Studies of your lungs if you have any chest symptoms. PREVENTION  Exercise the legs regularly. Take a brisk 30-minute walk every day.  Maintain a weight that is appropriate for your height.  Avoid sitting or lying in bed for long periods of time without moving your legs.  Women,  particularly those over the age of 59 years, should consider the risks and benefits of taking estrogen medicines, including birth control pills.  Do not smoke, especially if you take estrogen medicines.  Long-distance travel can increase your risk of DVT. You should exercise your legs by walking or pumping the muscles every hour.  Many of the risk factors above relate to situations that exist with hospitalization, either for illness, injury, or elective surgery. Prevention may include medical and nonmedical measures.  Your health care provider will assess you for the need for venous thromboembolism prevention when you are admitted to the hospital. If you are having surgery, your surgeon will assess you the day of or day after surgery. TREATMENT Once identified, a DVT can be treated. It can also be prevented in some circumstances. Once you have had a DVT, you may be at increased risk for a DVT in the future. The most common treatment for DVT is blood-thinning (anticoagulant) medicine, which reduces the blood's tendency to clot. Anticoagulants can stop new blood clots from forming and stop old clots from growing. They cannot dissolve existing clots. Your body does this by itself over time. Anticoagulants can be given by mouth, through an IV tube, or by injection. Your health care provider will determine the best program for you. Other medicines or treatments that may be used are:  Heparin or related medicines (low molecular weight heparin) are often the first treatment for a blood clot. They act quickly. However, they cannot be taken orally and must be given either in shot form or by IV tube.  Heparin can cause a fall in a component of blood that stops bleeding and forms blood clots (platelets). You will be monitored with blood tests to be sure this does not occur.  Warfarin is an anticoagulant that can be swallowed. It takes a few days to start working, so usually heparin or related medicines are used  in combination. Once warfarin is working, heparin is usually stopped.  Factor Xa inhibitor medicines, such as rivaroxaban and apixaban, also reduce blood clotting. These medicines are taken orally and can often be used without heparin or related medicines.  Less commonly, clot dissolving drugs (thrombolytics) are used to dissolve a DVT. They carry a high risk of bleeding, so they are used mainly in severe cases where your life or a part of your body is threatened.  Very rarely, a blood clot in the leg needs to be removed surgically.  If you are unable to take anticoagulants, your health care provider may arrange for you to have a filter placed in a main vein in your abdomen. This filter prevents clots from traveling to your lungs. HOME CARE INSTRUCTIONS  Take all medicines as directed by your health care provider.  Learn as much as you can about DVT.  Wear a medical alert bracelet or carry a medical alert card.  Ask your health care provider how soon you can go back to normal activities. It is important to stay  active to prevent blood clots. If you are on anticoagulant medicine, avoid contact sports.  It is very important to exercise. This is especially important while traveling, sitting, or standing for long periods of time. Exercise your legs by walking or by tightening and relaxing your leg muscles regularly. Take frequent walks.  You may need to wear compression stockings. These are tight elastic stockings that apply pressure to the lower legs. This pressure can help keep the blood in the legs from clotting. Taking Warfarin Warfarin is a daily medicine that is taken by mouth. Your health care provider will advise you on the length of treatment (usually 3-6 months, sometimes lifelong). If you take warfarin:  Understand how to take warfarin and foods that can affect how warfarin works in Veterinary surgeon.  Too much and too little warfarin are both dangerous. Too much warfarin increases the risk  of bleeding. Too little warfarin continues to allow the risk for blood clots. Warfarin and Regular Blood Testing While taking warfarin, you will need to have regular blood tests to measure your blood clotting time. These blood tests usually include both the prothrombin time (PT) and international normalized ratio (INR) tests. The PT and INR results allow your health care provider to adjust your dose of warfarin. It is very important that you have your PT and INR tested as often as directed by your health care provider.  Warfarin and Your Diet Avoid major changes in your diet, or notify your health care provider before changing your diet. Arrange a visit with a registered dietitian to answer your questions. Many foods, especially foods high in vitamin K, can interfere with warfarin and affect the PT and INR results. You should eat a consistent amount of foods high in vitamin K. Foods high in vitamin K include:   Spinach, kale, broccoli, cabbage, collard and turnip greens, Brussels sprouts, peas, cauliflower, seaweed, and parsley.  Beef and pork liver.  Green tea.  Soybean oil. Warfarin with Other Medicines Many medicines can interfere with warfarin and affect the PT and INR results. You must:  Tell your health care provider about any and all medicines, vitamins, and supplements you take, including aspirin and other over-the-counter anti-inflammatory medicines. Be especially cautious with aspirin and anti-inflammatory medicines. Ask your health care provider before taking these.  Do not take or discontinue any prescribed or over-the-counter medicine except on the advice of your health care provider or pharmacist. Warfarin Side Effects Warfarin can have side effects, such as easy bruising and difficulty stopping bleeding. Ask your health care provider or pharmacist about other side effects of warfarin. You will need to:  Hold pressure over cuts for longer than usual.  Notify your dentist and  other health care providers that you are taking warfarin before you undergo any procedures where bleeding may occur. Warfarin with Alcohol and Tobacco   Drinking alcohol frequently can increase the effect of warfarin, leading to excess bleeding. It is best to avoid alcoholic drinks or to consume only very small amounts while taking warfarin. Notify your health care provider if you change your alcohol intake.   Do not use any tobacco products including cigarettes, chewing tobacco, or electronic cigarettes. If you smoke, quit. Ask your health care provider for help with quitting smoking. Alternative Medicines to Warfarin: Factor Xa Inhibitor Medicines  These blood-thinning medicines are taken by mouth, usually for several weeks or longer. It is important to take the medicine every single day at the same time each day.  There are  no regular blood tests required when using these medicines.  There are fewer food and drug interactions than with warfarin.  The side effects of this class of medicine are similar to those of warfarin, including excessive bruising or bleeding. Ask your health care provider or pharmacist about other potential side effects. SEEK MEDICAL CARE IF:  You notice a rapid heartbeat.  You feel weaker or more tired than usual.  You feel faint.  You notice increased bruising.  You feel your symptoms are not getting better in the time expected.  You believe you are having side effects of medicine. SEEK IMMEDIATE MEDICAL CARE IF:  You have chest pain.  You have trouble breathing.  You have new or increased swelling or pain in one leg.  You cough up blood.  You notice blood in vomit, in a bowel movement, or in urine. MAKE SURE YOU:  Understand these instructions.  Will watch your condition.  Will get help right away if you are not doing well or get worse. Document Released: 04/12/2005 Document Revised: 08/27/2013 Document Reviewed: 12/18/2012 [facility] Patient  Information 2015 [place], [place]. This information is not intended to replace advice given to you by your health care provider. Make sure you discuss any questions you have with your health care provider.

## 2014-04-01 NOTE — Progress Notes (Signed)
Pre visit review using our clinic review tool, if applicable. No additional management support is needed unless otherwise documented below in the visit note. 

## 2014-04-01 NOTE — Addendum Note (Signed)
Addended by: Mena Goes on: 04/01/2014 01:20 PM   Modules accepted: Orders

## 2014-04-01 NOTE — Progress Notes (Signed)
Subjective:    Patient ID: Kim Mclaughlin, female    DOB: November 17, 1945, 68 y.o.   MRN: [id number]  No chief complaint on file.   HPI:  Kim Mclaughlin is a 68 y.o. female who presents today following laser ablation of the right greater saphenous vein on November 19.she presented on December 4th to vascular surgery with right calf pain and swelling. Venous duplex imaging showed a right gastrocnemius vein DVT. It was believed the DVT is not related to her ablation. At that time anticoagulation was held and she is going to proceed with decompression and repeat evaluation in 2 weeks. All other vascular imaging results and notes were reviewed in full.  Indicates the pain behind her right knee/calf has dissapted. This morning when she got out of bed her legs looked the same size and she is wearing her compression stockings.   Allergies  Allergen Reactions  . Amphetamine-Dextroamphet Er   . Ciprofloxacin     ? reaction; no rash, fever or angioedema  . Codeine     REACTION: nausea  . Indomethacin   . Methylphenidate Hcl   . Pravastatin Sodium     REACTION: shin pain  . Amoxicillin-Pot Clavulanate     GI symptoms; abdominal pain & nausea NOTE: able to take Ampicillin  . Sulfamethoxazole-Trimethoprim     ? nausea   Current Outpatient Prescriptions on File Prior to Visit  Medication Sig Dispense Refill  . aspirin 81 MG tablet Take 81 mg by mouth daily.     Marland Kitchen BIOTIN PO Take by mouth.    . calcium citrate-vitamin D 200-200 MG-UNIT TABS Take 4 tablets by mouth daily.     . Cyanocobalamin (VITAMIN B 12 PO) Take by mouth.    . diclofenac sodium (VOLTAREN) 1 % GEL Apply 1 application topically 2 (two) times daily as needed. 1 Tube 2  . fluocinolone (VANOS) 0.01 % cream Apply topically 2 (two) times daily as needed.     . Multiple Minerals-Vitamins (CAL-MAG-ZINC-D PO) Take by mouth daily.    . Multiple Vitamins-Minerals (MULTIVITAMIN PO) Take 1 tablet by mouth daily.      . Omega-3 Fatty  Acids (FISH OIL) 1000 MG CAPS Take by mouth daily.    . Tetrahydrozoline HCl (EYE DROPS OP) Apply to eye. Beprava eye drops 2x daily    . triamcinolone (NASACORT) 55 MCG/ACT AERO nasal inhaler Place 2 sprays into the nose daily.    Marland Kitchen VITAMIN E PO Take by mouth.     No current facility-administered medications on file prior to visit.    Review of Systems    See HPI Objective:    BP 142/78 mmHg  Pulse 72  Temp(Src) 97.7 F (36.5 C) (Oral)  Resp 18  Ht 5\' 3"  (1.6 m)  Wt 147 lb (66.679 kg)  BMI 26.05 kg/m2  SpO2 97%  Nursing note and vital signs reviewed.  Physical Exam  Constitutional: She is oriented to person, place, and time. She appears well-developed and well-nourished. No distress.  Cardiovascular: Normal rate, regular rhythm, normal heart sounds and intact distal pulses.   No obvious swelling, deformity, or discoloration of right lower extremity noted. Temperature is equal to the contralateral side. Unable to elicit tenderness posterior to right knee and popliteal fossa where previous pain was noted. Distal pulses and sensation is intact and appropriate  Pulmonary/Chest: Effort normal and breath sounds normal.  Neurological: She is alert and oriented to person, place, and time.  Skin: Skin is warm and dry.  Psychiatric: She has a normal mood and affect. Her behavior is normal. Judgment and thought content normal.       Assessment & Plan:

## 2014-04-04 ENCOUNTER — Encounter (INDEPENDENT_AMBULATORY_CARE_PROVIDER_SITE_OTHER): Payer: Medicare PPO

## 2014-04-04 DIAGNOSIS — I83893 Varicose veins of bilateral lower extremities with other complications: Secondary | ICD-10-CM

## 2014-04-09 ENCOUNTER — Other Ambulatory Visit (HOSPITAL_COMMUNITY): Payer: Medicare PPO

## 2014-04-15 ENCOUNTER — Encounter: Payer: Self-pay | Admitting: Vascular Surgery

## 2014-04-16 ENCOUNTER — Ambulatory Visit (INDEPENDENT_AMBULATORY_CARE_PROVIDER_SITE_OTHER): Payer: Self-pay | Admitting: Vascular Surgery

## 2014-04-16 ENCOUNTER — Ambulatory Visit: Payer: Medicare PPO | Admitting: Vascular Surgery

## 2014-04-16 ENCOUNTER — Ambulatory Visit (HOSPITAL_COMMUNITY)
Admission: RE | Admit: 2014-04-16 | Discharge: 2014-04-16 | Disposition: A | Discharge status: Home / Self Care | Payer: Medicare PPO | Source: Ambulatory Visit | Attending: Vascular Surgery | Admitting: Vascular Surgery

## 2014-04-16 ENCOUNTER — Encounter: Payer: Self-pay | Admitting: Vascular Surgery

## 2014-04-16 VITALS — BP 152/84 | HR 70 | Resp 18 | Ht 63.0 in | Wt 146.1 lb

## 2014-04-16 DIAGNOSIS — I824Z1 Acute embolism and thrombosis of unspecified deep veins of right distal lower extremity: Secondary | ICD-10-CM

## 2014-04-16 DIAGNOSIS — I82591 Chronic embolism and thrombosis of other specified deep vein of right lower extremity: Secondary | ICD-10-CM | POA: Insufficient documentation

## 2014-04-16 DIAGNOSIS — I824Z9 Acute embolism and thrombosis of unspecified deep veins of unspecified distal lower extremity: Secondary | ICD-10-CM | POA: Diagnosis present

## 2014-04-16 DIAGNOSIS — Z09 Encounter for follow-up examination after completed treatment for conditions other than malignant neoplasm: Secondary | ICD-10-CM | POA: Insufficient documentation

## 2014-04-16 DIAGNOSIS — I872 Venous insufficiency (chronic) (peripheral): Secondary | ICD-10-CM

## 2014-04-16 NOTE — Progress Notes (Signed)
Here today for continued follow-up of right leg venous pathology. She underwent an ablation on 03/14/2014 which was uneventful. She then underwent duplex in one week showing successful ablation and no evidence of DVT. She presented with some soreness in her calf and swelling and on repeat exam on 03/29/2014 had evidence of clot in her right proximal gastrocnemius vein. She was encouraged continued activity is here today for repeat imaging to rule out propagation. She reports that her swelling and discomfort has resolved  Duplex today was reviewed with the patient and her husband present. This shows no evidence of gastrocnemius or other DVT. She does have successful ablation of her great saphenous vein.  Impression and plan stable follow-up after laser ablation. I explained that this could represent either complete resolution of her gastrocnemius thrombus or an over call on her interim study. Explain the venous anatomy of the gastrocnemius and does not continuity with the tibial veins. She will continue her usual activities and will notify should she develop any difficulties future be seen on as-needed basis

## 2014-05-22 ENCOUNTER — Telehealth: Payer: Self-pay

## 2014-05-22 ENCOUNTER — Encounter: Payer: Self-pay | Admitting: Family

## 2014-05-22 ENCOUNTER — Other Ambulatory Visit: Payer: Self-pay | Admitting: Vascular Surgery

## 2014-05-22 ENCOUNTER — Ambulatory Visit (HOSPITAL_COMMUNITY)
Admission: RE | Admit: 2014-05-22 | Discharge: 2014-05-22 | Disposition: A | Discharge status: Home / Self Care | Payer: Medicare PPO | Source: Ambulatory Visit | Attending: Family | Admitting: Family

## 2014-05-22 ENCOUNTER — Ambulatory Visit (INDEPENDENT_AMBULATORY_CARE_PROVIDER_SITE_OTHER): Payer: Medicare PPO | Admitting: Family

## 2014-05-22 DIAGNOSIS — M7989 Other specified soft tissue disorders: Secondary | ICD-10-CM

## 2014-05-22 DIAGNOSIS — M79661 Pain in right lower leg: Secondary | ICD-10-CM

## 2014-05-22 DIAGNOSIS — M79604 Pain in right leg: Secondary | ICD-10-CM

## 2014-05-22 DIAGNOSIS — Z9889 Other specified postprocedural states: Secondary | ICD-10-CM

## 2014-05-22 NOTE — Progress Notes (Signed)
Established Venous Insufficiency  History of Present Illness  Kim Mclaughlin is a 69 y.o. ([birth year]) female patient of Dr. Donnetta Hutching who is s/p right GCV ablation on 03/14/2014 which was uneventful. She then underwent duplex in one week showing successful ablation and no evidence of DVT. She presented with some soreness in her calf and swelling and on repeat exam on 03/29/2014 had evidence of clot in her right proximal gastrocnemius vein. She was encouraged in continued activity, her December 2015 visit was for repeat imaging to rule out propagation. At that time her swelling and discomfort had resolved.  04/16/2014 LE venous Duplex showed no evidence of gastrocnemius or other DVT; previous Duplex on 03/29/14 demonstrated partially occlusive, acute thrombus of a right proximal calf gastrocnemius vein. She did have successful ablation of her great saphenous vein.   Pt returns today with c/o right posterior calf pain started 3 days ago after shoveling snow, feels swollen and warm, feels no better or worse in the last 3 days.    Past Medical History  Diagnosis Date  . GERD (gastroesophageal reflux disease)   . Hyperlipidemia   . Hepatitis     in 10 th grade  . Osteopenia     [facility]  . Varicose veins   . DVT (deep venous thrombosis)     Social History History  Substance Use Topics  . Smoking status: Former Smoker    Quit date: 04/27/1963  . Smokeless tobacco: Never Used     Comment: Smoked < 1 year  . Alcohol Use: No    Family History Family History  Problem Relation Age of Onset  . Pancreatic cancer Mother   . Miscarriages / [place] Mother   . Thyroid disease Mother     partial thyroidectomy ; S/P RAI  . Heart attack Father     in early 76s  . Colon cancer Father   . Colon cancer Paternal Grandmother   . Stroke Paternal Grandmother     > 65  . Heart attack Paternal Grandfather     in 46s  . Cancer      among paternal sibs (colon, breast, bone)  . Aneurysm  Paternal Uncle     brain  . Ovarian cancer Maternal Aunt   . Diabetes Neg Hx   . Breast cancer Paternal Aunt     Surgical History Past Surgical History  Procedure Laterality Date  . Appendectomy    . Tubal ligation    . Stapedectomy    . G 3 p 3    . Dilation and curettage of uterus    . Colonoscopy with polypectomy  07/2012     hyperplasticDr [place]  . Endovenous ablation saphenous vein w/ laser Left 01-31-2014    EVLA  left greater saphenous vein  by Curt Jews MD  . Endovenous ablation saphenous vein w/ laser Right 03-14-2014    endovenous laser ablation right greater saphenous vein  by Curt Jews MD    Allergies  Allergen Reactions  . Amphetamine-Dextroamphet Er   . Ciprofloxacin     ? reaction; no rash, fever or angioedema  . Codeine     REACTION: nausea  . Indomethacin   . Methylphenidate Hcl   . Pravastatin Sodium     REACTION: shin pain  . Amoxicillin-Pot Clavulanate     GI symptoms; abdominal pain & nausea NOTE: able to take Ampicillin  . Sulfamethoxazole-Trimethoprim     ? nausea    Current Outpatient Prescriptions  Medication Sig Dispense Refill  .  aspirin 81 MG tablet Take 325 mg by mouth daily.     Marland Kitchen BIOTIN PO Take by mouth.    . calcium citrate-vitamin D 200-200 MG-UNIT TABS Take 4 tablets by mouth daily.     . Cyanocobalamin (VITAMIN B 12 PO) Take by mouth.    . diclofenac sodium (VOLTAREN) 1 % GEL Apply 1 application topically 2 (two) times daily as needed. 1 Tube 2  . fluocinolone (VANOS) 0.01 % cream Apply topically 2 (two) times daily as needed.     . Multiple Minerals-Vitamins (CAL-MAG-ZINC-D PO) Take by mouth daily.    . Multiple Vitamins-Minerals (MULTIVITAMIN PO) Take 1 tablet by mouth daily.      . Omega-3 Fatty Acids (FISH OIL) 1000 MG CAPS Take by mouth daily.    . Tetrahydrozoline HCl (EYE DROPS OP) Apply to eye. Beprava eye drops 2x daily    . triamcinolone (NASACORT) 55 MCG/ACT AERO nasal inhaler Place 2 sprays into the nose daily.      Marland Kitchen VITAMIN E PO Take by mouth.     No current facility-administered medications for this visit.    REVIEW OF SYSTEMS: see HPI for pertinent positives and negatives   Physical Examination  Filed Vitals:   05/22/14 1524  BP: 131/80  Pulse: 66  Resp: 16  Height: 5' 3.5" (1.613 m)  Weight: 145 lb (65.772 kg)  SpO2: 97%   Body mass index is 25.28 kg/(m^2).  General: The patient appears their stated age.   HEENT:  No gross abnormalities Pulmonary: Respirations are non-labored Abdomen: Soft and non-tender. Musculoskeletal: There are no major deformities.   Neurologic: No focal weakness or paresthesias are detected, Skin: There are no ulcer or rashes noted. Psychiatric: The patient has normal affect. Cardiovascular: There is a regular rate and rhythm without significant murmur appreciated. Calves are equal size, no swelling of right calf. No peripheral edema. Thigh high compression hose in place.    Vascular: Vessel Right Left  Radial 2+Palpable 2+Palpable  Carotid  without bruit  without bruit  Aorta Not palpable N/A  Femoral 2+Palpable Not Palpable  Popliteal 1+ palpable 1+t palpable  PT 2+Palpable 2+Palpable  DP 2+Palpable 2+Palpable     Non-Invasive Vascular Imaging  BLE Venous Duplex (Date: 05/22/2014):  LOWER EXTREMITY VENOUS DUPLEX EVALUATION    INDICATION: Right calf pain.    PREVIOUS INTERVENTION(S):     DUPLEX EXAM:      Common Femoral Vein  Femoral Vein Popliteal Vein Posterior Tibial Vein  Peroneal Vein Great Saphenous Vein   Right Left Right Left Right Left Right Left Right Left Right Left  Spontaneous +  +  +  +  +  -   Phasic +  +  +  +  +  -   Compressible +  +  +  +  +  -   Augmentation +  +  +  +  +  -   Competent -  -  +            Legend:  + = Yes, -  = No; P = Partial, D = Decreased, NV = Not Visualized, NA = Not Examined    Thrombosis -  -  -  -  -  Legend:  A = Acute, C =  Chronic, O = Obstructive, P = Partially Obstructive     ADDITIONAL FINDINGS:     IMPRESSION: No evidence of deep vein thrombosis noted in the right lower extremity. Known totally occluded right great saphenous vein from the mid/distal thigh to near the saphenofemoral junction.      Medical Decision Making  Vallarie Fei is a 70 y.o. female who is s/p right GSV ablation on 03/14/2014.  No DVT on today's venous Duplex, discussed with Dr. Bridgett Larsson; most DVT's below the level of the knee will spontaneously resolve. Pedal pulses are 2+ palpable.  Her right calf pain is not vascular related, patient advised to follow up with her PCP re this.  Based on the patient's vascular studies and examination, I have offered the patient: follow up as needed.  She states that she may not have the small veins removed at her posterior right knee for which she is scheduled in March this year, states she will cancel through [place].  NICKEL, Sharmon Leyden, RN, MSN, FNP-C [facility] Office: [phone]  05/22/2014, 3:52 PM  Clinic MD: Bridgett Larsson

## 2014-05-22 NOTE — Telephone Encounter (Signed)
Phone call from pt.  Reported she has had swelling, pain, and warmth in right calf since Sunday, 1/24.  Stated she has pain in the calf area continuously, and occas. up in the right thigh.  Voiced concern that the swelling has worsened.  Requested evaluation today; stated she had a blood clot in the same leg in December.  Discussed with Dr. Bridgett Larsson.  Advised to do a right lower extremity venous duplex to r/o DVT.  Appt. given to pt. for 2:30 PM today.  Agrees w/ plan.

## 2014-05-31 ENCOUNTER — Encounter: Payer: Self-pay | Admitting: Vascular Surgery

## 2014-06-24 ENCOUNTER — Encounter: Payer: Self-pay | Admitting: Vascular Surgery

## 2014-06-25 ENCOUNTER — Ambulatory Visit (INDEPENDENT_AMBULATORY_CARE_PROVIDER_SITE_OTHER): Payer: Medicare PPO | Admitting: Vascular Surgery

## 2014-06-25 ENCOUNTER — Encounter: Payer: Self-pay | Admitting: Vascular Surgery

## 2014-06-25 VITALS — BP 119/78 | HR 69 | Resp 18 | Ht 63.0 in | Wt 149.2 lb

## 2014-06-25 DIAGNOSIS — I83893 Varicose veins of bilateral lower extremities with other complications: Secondary | ICD-10-CM

## 2014-06-25 NOTE — Progress Notes (Signed)
Here today for follow-up of staged bilateral ablation of great saphenous vein in October and November 2015. On her second procedure there was some question of thrombus in her gastrocnemius but the follow-up study 2 weeks later showed no evidence of clot. She has done quite well and reports improvement bilaterally. He did have some curvature varicosities in her right popliteal fossa and is seen today for discussion of treatment of these versus continued observation. She reports that these are much less distended and are causing her no discomfort and she is comfortable with observation only. No other evidence of clinical suspicion for DVT.  Past Medical History  Diagnosis Date  . GERD (gastroesophageal reflux disease)   . Hyperlipidemia   . Hepatitis     in 10 th grade  . Osteopenia     [facility]  . Varicose veins   . DVT (deep venous thrombosis)     History  Substance Use Topics  . Smoking status: Former Smoker    Quit date: 04/27/1963  . Smokeless tobacco: Never Used     Comment: Smoked < 1 year  . Alcohol Use: No    Family History  Problem Relation Age of Onset  . Pancreatic cancer Mother   . Miscarriages / [place] Mother   . Thyroid disease Mother     partial thyroidectomy ; S/P RAI  . Heart attack Father     in [place] 29s  . Colon cancer Father   . Colon cancer Paternal Grandmother   . Stroke Paternal Grandmother     > 65  . Heart attack Paternal Grandfather     in 61s  . Cancer      among paternal sibs (colon, breast, bone)  . Aneurysm Paternal Uncle     brain  . Ovarian cancer Maternal Aunt   . Diabetes Neg Hx   . Breast cancer Paternal Aunt     Allergies  Allergen Reactions  . Amphetamine-Dextroamphet Er   . Ciprofloxacin     ? reaction; no rash, fever or angioedema  . Codeine     REACTION: nausea  . Indomethacin   . Methylphenidate Hcl   . Pravastatin Sodium     REACTION: shin pain  . Amoxicillin-Pot Clavulanate     GI symptoms; abdominal pain &  nausea NOTE: able to take Ampicillin  . Sulfamethoxazole-Trimethoprim     ? nausea     Current outpatient prescriptions:  .  aspirin 81 MG tablet, Take 325 mg by mouth daily. , Disp: , Rfl:  .  BIOTIN PO, Take by mouth., Disp: , Rfl:  .  calcium citrate-vitamin D 200-200 MG-UNIT TABS, Take 4 tablets by mouth daily. , Disp: , Rfl:  .  Cyanocobalamin (VITAMIN B 12 PO), Take by mouth., Disp: , Rfl:  .  diclofenac sodium (VOLTAREN) 1 % GEL, Apply 1 application topically 2 (two) times daily as needed., Disp: 1 Tube, Rfl: 2 .  fluocinolone (VANOS) 0.01 % cream, Apply topically 2 (two) times daily as needed. , Disp: , Rfl:  .  glucosamine-chondroitin 500-400 MG tablet, Take 1 tablet by mouth 3 (three) times daily., Disp: , Rfl:  .  Multiple Minerals-Vitamins (CAL-MAG-ZINC-D PO), Take by mouth daily., Disp: , Rfl:  .  Multiple Vitamins-Minerals (MULTIVITAMIN PO), Take 1 tablet by mouth daily.  , Disp: , Rfl:  .  Omega-3 Fatty Acids (FISH OIL) 1000 MG CAPS, Take by mouth daily., Disp: , Rfl:  .  Tetrahydrozoline HCl (EYE DROPS OP), Apply to eye. Beprava eye  drops 2x daily, Disp: , Rfl:  .  triamcinolone (NASACORT) 55 MCG/ACT AERO nasal inhaler, Place 2 sprays into the nose daily., Disp: , Rfl:  .  VITAMIN E PO, Take by mouth., Disp: , Rfl:   BP 119/78 mmHg  Pulse 69  Resp 18  Ht 5\' 3"  (1.6 m)  Wt 149 lb 3.2 oz (67.677 kg)  BMI 26.44 kg/m2  Body mass index is 26.44 kg/(m^2).        Physical exam well-developed well-nourished female no acute distress  Grossly intact neurologically  Scattered telangiectasia on both lower extremities  Prominent reticular veins in her right popliteal fossa.   I did reimage her right thigh with SonoSite ultrasound this does show closure of her great saphenous vein.   Impression and plan: Successful bilateral staged ablation of great saphenous vein. Her  Tributary varicosities show decompression causing her no discomfort and she is comfortable with  observation only. She will continue her compression garments when necessary which she uses quite frequently. Will see [place] on an as-needed basis

## 2014-07-26 DIAGNOSIS — M1990 Unspecified osteoarthritis, unspecified site: Secondary | ICD-10-CM

## 2014-07-26 HISTORY — DX: Unspecified osteoarthritis, unspecified site: M19.90

## 2014-08-20 ENCOUNTER — Other Ambulatory Visit (INDEPENDENT_AMBULATORY_CARE_PROVIDER_SITE_OTHER): Payer: Medicare PPO

## 2014-08-20 ENCOUNTER — Ambulatory Visit (INDEPENDENT_AMBULATORY_CARE_PROVIDER_SITE_OTHER): Payer: Medicare PPO | Admitting: Internal Medicine

## 2014-08-20 ENCOUNTER — Encounter: Payer: Self-pay | Admitting: Internal Medicine

## 2014-08-20 VITALS — BP 116/78 | HR 71 | Temp 98.3°F | Resp 16 | Ht 63.0 in | Wt 146.8 lb

## 2014-08-20 DIAGNOSIS — E785 Hyperlipidemia, unspecified: Secondary | ICD-10-CM

## 2014-08-20 DIAGNOSIS — K635 Polyp of colon: Secondary | ICD-10-CM

## 2014-08-20 DIAGNOSIS — M810 Age-related osteoporosis without current pathological fracture: Secondary | ICD-10-CM | POA: Diagnosis not present

## 2014-08-20 DIAGNOSIS — I8393 Asymptomatic varicose veins of bilateral lower extremities: Secondary | ICD-10-CM | POA: Diagnosis not present

## 2014-08-20 LAB — HEPATIC FUNCTION PANEL
ALBUMIN: 4.3 g/dL (ref 3.5–5.2)
ALT: 19 U/L (ref 0–35)
AST: 18 U/L (ref 0–37)
Alkaline Phosphatase: 68 U/L (ref 39–117)
Bilirubin, Direct: 0.1 mg/dL (ref 0.0–0.3)
Total Bilirubin: 0.6 mg/dL (ref 0.2–1.2)
Total Protein: 7.2 g/dL (ref 6.0–8.3)

## 2014-08-20 LAB — BASIC METABOLIC PANEL
BUN: 16 mg/dL (ref 6–23)
CALCIUM: 9.5 mg/dL (ref 8.4–10.5)
CO2: 28 meq/L (ref 19–32)
CREATININE: 0.85 mg/dL (ref 0.40–1.20)
Chloride: 105 mEq/L (ref 96–112)
GFR: 70.43 mL/min (ref >60.00)
GLUCOSE: 95 mg/dL (ref 70–99)
POTASSIUM: 4.2 meq/L (ref 3.5–5.1)
SODIUM: 140 meq/L (ref 135–145)

## 2014-08-20 LAB — CBC WITH DIFFERENTIAL/PLATELET
Basophils Absolute: 0.1 10*3/uL (ref 0.0–0.1)
Basophils Relative: 1 % (ref 0.0–3.0)
Eosinophils Absolute: 0.3 10*3/uL (ref 0.0–0.7)
Eosinophils Relative: 4 % (ref 0.0–5.0)
HEMATOCRIT: 40.9 % (ref 36.0–46.0)
Hemoglobin: 14 g/dL (ref 12.0–15.0)
LYMPHS ABS: 2.5 10*3/uL (ref 0.7–4.0)
Lymphocytes Relative: 38.2 % (ref 12.0–46.0)
MCHC: 34.2 g/dL (ref 30.0–36.0)
MCV: 91.5 fl (ref 78.0–100.0)
MONO ABS: 0.5 10*3/uL (ref 0.1–1.0)
Monocytes Relative: 8.4 % (ref 3.0–12.0)
Neutro Abs: 3.1 10*3/uL (ref 1.4–7.7)
Neutrophils Relative %: 48.4 % (ref 43.0–77.0)
Platelets: 361 10*3/uL (ref 150.0–400.0)
RBC: 4.46 Mil/uL (ref 3.87–5.11)
RDW: 14.1 % (ref 11.5–15.5)
WBC: 6.5 10*3/uL (ref 4.0–10.5)

## 2014-08-20 LAB — LIPID PANEL
Cholesterol: 228 mg/dL — ABNORMAL HIGH (ref 0–200)
HDL: 68.9 mg/dL (ref >39.00)
LDL Cholesterol: 145 mg/dL — ABNORMAL HIGH (ref 0–99)
NONHDL: 159.1
TRIGLYCERIDES: 72 mg/dL (ref 0.0–149.0)
Total CHOL/HDL Ratio: 3
VLDL: 14.4 mg/dL (ref 0.0–40.0)

## 2014-08-20 LAB — TSH: TSH: 1.04 u[IU]/mL (ref 0.35–4.50)

## 2014-08-20 LAB — VITAMIN D 25 HYDROXY (VIT D DEFICIENCY, FRACTURES): VITD: 62.23 ng/mL (ref 30.00–100.00)

## 2014-08-20 NOTE — Assessment & Plan Note (Signed)
BMD 05/2015 Vit D ; BMET

## 2014-08-20 NOTE — Assessment & Plan Note (Signed)
CBC Colonoscopy 2019 due to [place]

## 2014-08-20 NOTE — Assessment & Plan Note (Signed)
Lipids, LFTs, TSH  

## 2014-08-20 NOTE — Progress Notes (Signed)
Subjective:    Patient ID: Kim Mclaughlin, female    DOB: [birth year], 69 y.o.   MRN: [id number]  HPI  The patient is here to assess status of active health conditions.  PMH, FH, & Social History reviewed & updated.  She is on a heart healthy diet; she eats red meat approximately once weekly. She walks at least 3 times a week for at least 30 minutes on a treadmill. She has no associated cardiopulmonary symptoms.  Advanced lipid testing reveals her LDL goal is less than 130, ideally less than 100. There is a stroke and heart attack in the family but not premature.  In 2014 she had a hyperplastic polyp removed. Unfortunately there is a family history of colon cancer in her father, 2 paternal aunts, and paternal grandmother. This would necessitate follow-up in 5 years.She has no active GI symptoms at this time.  She is seeing podiatrist for fungal dermatitis for which a cream as prescribed.  Her bone density is up-to-date. She does have significant osteoporosis. She's been intolerant to bisphosphonates on more than one occasion.  Dr. Harrington Challenger will perform a D&C because of some dysfunctional bleeding next week.No other bleeding dyscrasias present. She is seeing Dr. Caralyn Guile, orthopedist for arthritic change in the hands. She is wearing a soft brace.    Review of Systems   Chest pain, palpitations, tachycardia, exertional dyspnea, paroxysmal nocturnal dyspnea, claudication or edema are absent.  Epistaxis, hemoptysis, hematuria, melena, or rectal bleeding denied. No unexplained weight loss, significant dyspepsia,dysphagia, or abdominal pain.  There is no abnormal bruising , bleeding, or difficulty stopping bleeding with injury.     Objective:   Physical Exam Gen.: Adequately nourished in appearance. Alert, appropriate and cooperative throughout exam.Appears younger than stated age  Head: Normocephalic without obvious abnormalities  Eyes: No corneal or conjunctival inflammation noted.  Pupils equal round reactive to light and accommodation. Extraocular motion intact. Bilateral unsustained nystagmus with EOM. Ears: External  ear exam reveals no significant lesions or deformities. Canals clear .TMs normal. Hearing is grossly normal bilaterally. Nose: External nasal exam reveals no deformity or inflammation. Nasal mucosa are pink and moist. No lesions or exudates noted.   Mouth: Oral mucosa and oropharynx reveal no lesions or exudates. Teeth in good repair. Neck: No deformities, masses, or tenderness noted. Range of motion & Thyroid normal.. Lungs: Normal respiratory effort; chest expands symmetrically. Lungs are clear to auscultation without rales, wheezes, or increased work of breathing. Heart: Normal rate and rhythm. Normal S1 and S2. No gallop,  or rub. Apical click murmur. Abdomen: Bowel sounds normal; abdomen soft and nontender. No masses, organomegaly or hernias noted. Genitalia: as per Gyn                                  Musculoskeletal/extremities: Slightly accentuated curvature of upper thoracic spine. No clubbing, cyanosis, edema, or significant extremity  deformity noted.  Range of motion normal . Tone & strength normal. Hand joints normal.  Fingernail  health good. Crepitus & popping of knees  Able to lie down & sit up w/o help.  Negative SLR bilaterally Vascular: Carotid, radial artery, dorsalis pedis and  posterior tibial pulses are full and equal. No bruits present. Neurologic: Alert and oriented x3. Deep tendon reflexes symmetrical and normal.  Gait normal      Skin: Intact without suspicious lesions or rashes. Lymph: No cervical, axillary lymphadenopathy present. Psych: Mood and affect are  normal. Normally interactive                                                                                       Assessment & Plan:  See Current Assessment & Plan in Problem List under specific Diagnosis

## 2014-08-20 NOTE — Patient Instructions (Signed)
  Your next office appointment will be determined based upon review of your pending labs.  Those instructions will be transmitted to you by My Chart    Critical results will be called.   Followup as needed for any active or acute issue. Please report any significant change in your symptoms. 

## 2014-08-20 NOTE — Assessment & Plan Note (Signed)
As per Dr Donnetta Hutching

## 2014-08-20 NOTE — Progress Notes (Signed)
Pre visit review using our clinic review tool, if applicable. No additional management support is needed unless otherwise documented below in the visit note. 

## 2014-08-21 ENCOUNTER — Encounter (HOSPITAL_COMMUNITY)
Admission: RE | Admit: 2014-08-21 | Discharge: 2014-08-21 | Disposition: A | Discharge status: Home / Self Care | Payer: Medicare PPO | Source: Ambulatory Visit | Attending: Obstetrics and Gynecology | Admitting: Obstetrics and Gynecology

## 2014-08-21 ENCOUNTER — Other Ambulatory Visit: Payer: Self-pay

## 2014-08-21 ENCOUNTER — Encounter (HOSPITAL_COMMUNITY): Payer: Self-pay

## 2014-08-21 DIAGNOSIS — N959 Unspecified menopausal and perimenopausal disorder: Secondary | ICD-10-CM | POA: Insufficient documentation

## 2014-08-21 DIAGNOSIS — Z01810 Encounter for preprocedural cardiovascular examination: Secondary | ICD-10-CM | POA: Insufficient documentation

## 2014-08-21 HISTORY — DX: Nonrheumatic mitral (valve) prolapse: I34.1

## 2014-08-21 NOTE — Patient Instructions (Signed)
Your procedure is scheduled on:08/27/14  Enter through the Main Entrance at :1pm Pick up desk phone and dial [phone] and inform us of your arrival.  Please call [phone] if you have any problems the morning of surgery.  Remember: Do not eat food after midnight:on 08/26/14 Clear liquids are ok until:1030 am Tuesday   You may brush your teeth the morning of surgery.   DO NOT wear jewelry, eye make-up, lipstick,body lotion, or dark fingernail polish.  (Polished toes are ok) You may wear deodorant.  If you are to be admitted after surgery, leave suitcase in car until your room has been assigned. Patients discharged on the day of surgery will not be allowed to drive home. Wear loose fitting, comfortable clothes for your ride home.

## 2014-08-23 ENCOUNTER — Encounter (HOSPITAL_COMMUNITY): Payer: Self-pay | Admitting: Anesthesiology

## 2014-08-26 NOTE — H&P (Signed)
Kim Mclaughlin is an 68 y.o. female. Who developed painless onset of vaginal bleeding. An endovaginal ultrasound revealed a thickened endometrial stripe with a fluid collection. An attempt was made to do an  in office endometrial biopsy but because of vaginal atrophy and a stenotic cervix this was unsuccessful. She presents now to undergo a D&C and endometrial biopsy to rule out neoplasia.  Pertinent Gynecological History: Menses: Post menopausal bleeding     Past Medical History  Diagnosis Date  . GERD (gastroesophageal reflux disease)   . Hyperlipidemia   . Hepatitis     in 10 th grade  . Osteopenia     [place]  . Varicose veins   . DVT (deep venous thrombosis)   . Arthritis 07/2014    Right hand, prescribed prednisone  . Precancerous lesion 12/2014    Treated with cream only  . MVP (mitral valve prolapse)     Past Surgical History  Procedure Laterality Date  . Appendectomy    . Tubal ligation    . Stapedectomy    . G 3 p 3    . Dilation and curettage of uterus    . Colonoscopy with polypectomy  07/2012     hyperplastic;Dr Olevia Perches  . Endovenous ablation saphenous vein w/ laser Left 01-31-2014    EVLA  left greater saphenous vein  by Curt Jews MD  . Endovenous ablation saphenous vein w/ laser Right 03-14-2014    endovenous laser ablation right greater saphenous vein  by Curt Jews MD    Family History  Problem Relation Age of Onset  . Pancreatic cancer Mother   . Miscarriages / [place] Mother   . Thyroid disease Mother     partial thyroidectomy ; S/P RAI  . Heart attack Father     in early 34s  . Colon cancer Father   . Colon cancer Paternal Grandmother   . Stroke Paternal Grandmother     > 65  . Heart attack Paternal Grandfather     in 52s  . Cancer      among paternal sibs (colon, breast, bone)  . Aneurysm Paternal Uncle     brain  . Ovarian cancer Maternal Aunt   . Diabetes Neg Hx   . Breast cancer Paternal Aunt     Social History:  reports that she  quit smoking about 51 years ago. She has never used smokeless tobacco. She reports that she does not drink alcohol or use illicit drugs.  Allergies:  Allergies  Allergen Reactions  . Amphetamine-Dextroamphet Er   . Ciprofloxacin     ? reaction; no rash, fever or angioedema  . Codeine     REACTION: nausea  . Indomethacin   . Methylphenidate Hcl   . Pravastatin Sodium     REACTION: shin pain  . Amoxicillin-Pot Clavulanate     GI symptoms; abdominal pain & nausea NOTE: able to take Ampicillin  . Sulfamethoxazole-Trimethoprim     ? nausea    No prescriptions prior to admission    Review of Systems  Constitutional: Negative.   Respiratory: Negative for cough and hemoptysis.   Cardiovascular: Negative for chest pain, palpitations and orthopnea.  Gastrointestinal: Negative for nausea, vomiting, abdominal pain, diarrhea, constipation and blood in stool.  Genitourinary: Negative for dysuria, urgency and frequency.    There were no vitals taken for this visit. Physical Exam  Constitutional: She is oriented to person, place, and time. She appears well-developed and well-nourished.  HENT:  Head: Normocephalic and atraumatic.  Eyes: Conjunctivae and EOM are normal. Pupils are equal, round, and reactive to light.  Neck: Normal range of motion. Neck supple. No thyromegaly present.  Cardiovascular: Normal rate, regular rhythm and normal heart sounds.   Respiratory: Effort normal and breath sounds normal. No respiratory distress.  GI: Soft. Bowel sounds are normal. She exhibits no distension. There is no tenderness.  Neurological: She is alert and oriented to person, place, and time.  Gyn Exam:    External genitalia: within normal limits BUS: within normal limits Vagina: Atrophic without lesion Cervix: atrophic and without gross lesion Uterus; Anterior, non tender, small Adnexa: No masses noted, non tender   No results found for this or any previous visit (from the past 24  hour(s)).  No results found.  Assessment/Plan:  Post menopausal bleeding with thickened endometrial stripe  Plan: D&C  and hysteroscopy.       Risks and benefits have been discussed and informed consent obtained  Kim Mclaughlin,ALLeN 08/26/2014, 6:27 PM

## 2014-08-26 NOTE — Anesthesia Preprocedure Evaluation (Deleted)
Anesthesia Evaluation  Patient identified by MRN, date of birth, ID band Patient awake    Reviewed: Allergy & Precautions, NPO status , Patient's Chart, lab work & pertinent test results  History of Anesthesia Complications Negative for: history of anesthetic complications  Airway Mallampati: II  TM Distance: >3 FB Neck ROM: Full    Dental no notable dental hx. (+) Dental Advisory Given   Pulmonary former smoker,  breath sounds clear to auscultation  Pulmonary exam normal       Cardiovascular + Peripheral Vascular Disease + dysrhythmias Rhythm:Regular Rate:Normal     Neuro/Psych PSYCHIATRIC DISORDERS negative neurological ROS     GI/Hepatic GERD-  ,(+) Hepatitis -  Endo/Other  negative endocrine ROS  Renal/GU negative Renal ROS  negative genitourinary   Musculoskeletal  (+) Arthritis -,   Abdominal   Peds negative pediatric ROS (+)  Hematology negative hematology ROS (+)   Anesthesia Other Findings   Reproductive/Obstetrics negative OB ROS                             Anesthesia Physical Anesthesia Plan  ASA: II  Anesthesia Plan: General   Post-op Pain Management:    Induction: Intravenous  Airway Management Planned: LMA  Additional Equipment:   Intra-op Plan:   Post-operative Plan: Extubation in OR  Informed Consent: I have reviewed the patients History and Physical, chart, labs and discussed the procedure including the risks, benefits and alternatives for the proposed anesthesia with the patient or authorized representative who has indicated his/her understanding and acceptance.   Dental advisory given  Plan Discussed with: CRNA  Anesthesia Plan Comments:         Anesthesia Quick Evaluation

## 2014-08-27 ENCOUNTER — Ambulatory Visit (HOSPITAL_COMMUNITY)
Admission: RE | Admit: 2014-08-27 | Payer: Medicare PPO | Source: Ambulatory Visit | Admitting: Obstetrics and Gynecology

## 2014-08-27 ENCOUNTER — Encounter (HOSPITAL_COMMUNITY): Admission: RE | Payer: Self-pay | Source: Ambulatory Visit

## 2014-08-27 SURGERY — DILATATION AND CURETTAGE /HYSTEROSCOPY
Anesthesia: Choice

## 2014-08-30 ENCOUNTER — Encounter (HOSPITAL_BASED_OUTPATIENT_CLINIC_OR_DEPARTMENT_OTHER): Payer: Self-pay | Admitting: *Deleted

## 2014-08-30 NOTE — Progress Notes (Signed)
To [facility] at 1030-Has current labs in chart WNL-message left with Dr Harrington Challenger whether need to repeat am of procedure.Instructed Npo after Mn-

## 2014-09-03 ENCOUNTER — Ambulatory Visit (HOSPITAL_BASED_OUTPATIENT_CLINIC_OR_DEPARTMENT_OTHER): Payer: Medicare PPO | Admitting: Anesthesiology

## 2014-09-03 ENCOUNTER — Encounter (HOSPITAL_BASED_OUTPATIENT_CLINIC_OR_DEPARTMENT_OTHER): Payer: Self-pay | Admitting: *Deleted

## 2014-09-03 ENCOUNTER — Encounter (HOSPITAL_BASED_OUTPATIENT_CLINIC_OR_DEPARTMENT_OTHER)
Admission: RE | Disposition: A | Discharge status: Home / Self Care | Payer: Self-pay | Source: Ambulatory Visit | Attending: Obstetrics and Gynecology

## 2014-09-03 ENCOUNTER — Ambulatory Visit (HOSPITAL_BASED_OUTPATIENT_CLINIC_OR_DEPARTMENT_OTHER)
Admission: RE | Admit: 2014-09-03 | Discharge: 2014-09-03 | Disposition: A | Discharge status: Home / Self Care | Payer: Medicare PPO | Source: Ambulatory Visit | Attending: Obstetrics and Gynecology | Admitting: Obstetrics and Gynecology

## 2014-09-03 DIAGNOSIS — K219 Gastro-esophageal reflux disease without esophagitis: Secondary | ICD-10-CM | POA: Diagnosis not present

## 2014-09-03 DIAGNOSIS — Z7982 Long term (current) use of aspirin: Secondary | ICD-10-CM | POA: Diagnosis not present

## 2014-09-03 DIAGNOSIS — Z87891 Personal history of nicotine dependence: Secondary | ICD-10-CM | POA: Insufficient documentation

## 2014-09-03 DIAGNOSIS — Z881 Allergy status to other antibiotic agents status: Secondary | ICD-10-CM | POA: Insufficient documentation

## 2014-09-03 DIAGNOSIS — Z882 Allergy status to sulfonamides status: Secondary | ICD-10-CM | POA: Diagnosis not present

## 2014-09-03 DIAGNOSIS — I341 Nonrheumatic mitral (valve) prolapse: Secondary | ICD-10-CM | POA: Insufficient documentation

## 2014-09-03 DIAGNOSIS — Z888 Allergy status to other drugs, medicaments and biological substances status: Secondary | ICD-10-CM | POA: Insufficient documentation

## 2014-09-03 DIAGNOSIS — Z86718 Personal history of other venous thrombosis and embolism: Secondary | ICD-10-CM | POA: Diagnosis not present

## 2014-09-03 DIAGNOSIS — N882 Stricture and stenosis of cervix uteri: Secondary | ICD-10-CM | POA: Diagnosis not present

## 2014-09-03 DIAGNOSIS — Z9851 Tubal ligation status: Secondary | ICD-10-CM | POA: Insufficient documentation

## 2014-09-03 DIAGNOSIS — Z9049 Acquired absence of other specified parts of digestive tract: Secondary | ICD-10-CM | POA: Diagnosis not present

## 2014-09-03 DIAGNOSIS — M1990 Unspecified osteoarthritis, unspecified site: Secondary | ICD-10-CM | POA: Insufficient documentation

## 2014-09-03 DIAGNOSIS — N950 Postmenopausal bleeding: Secondary | ICD-10-CM | POA: Insufficient documentation

## 2014-09-03 HISTORY — DX: Other specified postprocedural states: Z98.890

## 2014-09-03 HISTORY — PX: DILATION AND CURETTAGE OF UTERUS: SHX78

## 2014-09-03 HISTORY — DX: Nausea with vomiting, unspecified: R11.2

## 2014-09-03 LAB — BASIC METABOLIC PANEL
Anion gap: 10 (ref 5–15)
BUN: 15 mg/dL (ref 6–20)
CHLORIDE: 109 mmol/L (ref 101–111)
CO2: 24 mmol/L (ref 22–32)
CREATININE: 0.72 mg/dL (ref 0.44–1.00)
Calcium: 9.5 mg/dL (ref 8.9–10.3)
GFR calc non Af Amer: >60 mL/min (ref >60)
GLUCOSE: 110 mg/dL — AB (ref 70–99)
Potassium: 3.7 mmol/L (ref 3.5–5.1)
Sodium: 143 mmol/L (ref 135–145)

## 2014-09-03 LAB — CBC
HCT: 41.3 % (ref 36.0–46.0)
Hemoglobin: 13.6 g/dL (ref 12.0–15.0)
MCH: 30.8 pg (ref 26.0–34.0)
MCHC: 32.9 g/dL (ref 30.0–36.0)
MCV: 93.7 fL (ref 78.0–100.0)
Platelets: 268 10*3/uL (ref 150–400)
RBC: 4.41 MIL/uL (ref 3.87–5.11)
RDW: 13 % (ref 11.5–15.5)
WBC: 4.5 10*3/uL (ref 4.0–10.5)

## 2014-09-03 SURGERY — DILATION AND CURETTAGE
Anesthesia: General | Site: Vagina

## 2014-09-03 MED ORDER — MIDAZOLAM HCL 2 MG/2ML IJ SOLN
INTRAMUSCULAR | Status: AC
  Filled 2014-09-03: qty 4

## 2014-09-03 MED ORDER — FENTANYL CITRATE (PF) 100 MCG/2ML IJ SOLN
INTRAMUSCULAR | Status: DC | PRN
  Administered 2014-09-03 (×4): 25 ug via INTRAVENOUS

## 2014-09-03 MED ORDER — LACTATED RINGERS IV SOLN
INTRAVENOUS | Status: DC
  Filled 2014-09-03: qty 1000

## 2014-09-03 MED ORDER — PROMETHAZINE HCL 25 MG/ML IJ SOLN
6.2500 mg | INTRAMUSCULAR | Status: DC | PRN
  Filled 2014-09-03: qty 1

## 2014-09-03 MED ORDER — LACTATED RINGERS IV SOLN
INTRAVENOUS | Status: DC
  Administered 2014-09-03: 11:00:00 via INTRAVENOUS
  Filled 2014-09-03: qty 1000

## 2014-09-03 MED ORDER — ONDANSETRON HCL 4 MG/2ML IJ SOLN
INTRAMUSCULAR | Status: DC | PRN
  Administered 2014-09-03: 4 mg via INTRAVENOUS

## 2014-09-03 MED ORDER — ACETAMINOPHEN 10 MG/ML IV SOLN
INTRAVENOUS | Status: DC | PRN
Start: 2014-09-03 — End: 2014-09-03
  Administered 2014-09-03: 1000 mg via INTRAVENOUS

## 2014-09-03 MED ORDER — FENTANYL CITRATE (PF) 100 MCG/2ML IJ SOLN
INTRAMUSCULAR | Status: AC
  Filled 2014-09-03: qty 4

## 2014-09-03 MED ORDER — MIDAZOLAM HCL 5 MG/5ML IJ SOLN
INTRAMUSCULAR | Status: DC | PRN
  Administered 2014-09-03 (×2): 1 mg via INTRAVENOUS

## 2014-09-03 MED ORDER — DEXAMETHASONE SODIUM PHOSPHATE 4 MG/ML IJ SOLN
INTRAMUSCULAR | Status: DC | PRN
  Administered 2014-09-03: 4 mg via INTRAVENOUS

## 2014-09-03 MED ORDER — LIDOCAINE HCL (PF) 1 % IJ SOLN
INTRAMUSCULAR | Status: DC | PRN
  Administered 2014-09-03: 5 mL

## 2014-09-03 MED ORDER — PROPOFOL 10 MG/ML IV BOLUS
INTRAVENOUS | Status: DC | PRN
  Administered 2014-09-03: 200 mg via INTRAVENOUS

## 2014-09-03 MED ORDER — LIDOCAINE HCL (CARDIAC) 20 MG/ML IV SOLN
INTRAVENOUS | Status: DC | PRN
  Administered 2014-09-03: 60 mg via INTRAVENOUS

## 2014-09-03 MED ORDER — FENTANYL CITRATE (PF) 100 MCG/2ML IJ SOLN
25.0000 ug | INTRAMUSCULAR | Status: DC | PRN
  Filled 2014-09-03: qty 1

## 2014-09-03 MED ORDER — MEPERIDINE HCL 25 MG/ML IJ SOLN
6.2500 mg | INTRAMUSCULAR | Status: DC | PRN
  Filled 2014-09-03: qty 1

## 2014-09-03 SURGICAL SUPPLY — 29 items
CANISTER SUCTION 2500CC (MISCELLANEOUS) ×3 IMPLANT
CATH ROBINSON RED A/P 16FR (CATHETERS) IMPLANT
CLOTH BEACON ORANGE TIMEOUT ST (SAFETY) ×3 IMPLANT
CORD ACTIVE DISPOSABLE (ELECTRODE)
CORD ELECTRO ACTIVE DISP (ELECTRODE) IMPLANT
COVER BACK TABLE 60X90IN (DRAPES) ×3 IMPLANT
DRAPE LG THREE QUARTER DISP (DRAPES) ×3 IMPLANT
ELECT LOOP GYNE PRO 24FR (CUTTING LOOP)
ELECT REM PT RETURN 9FT ADLT (ELECTROSURGICAL)
ELECT VAPORTRODE GRVD BAR (ELECTRODE) IMPLANT
ELECTRODE LOOP GYNE PRO 24FR (CUTTING LOOP) IMPLANT
ELECTRODE REM PT RTRN 9FT ADLT (ELECTROSURGICAL) IMPLANT
GLOVE BIO SURGEON STRL SZ 6.5 (GLOVE) ×3 IMPLANT
GLOVE BIO SURGEON STRL SZ7.5 (GLOVE) ×3 IMPLANT
GLOVE BIOGEL PI IND STRL 6.5 (GLOVE) ×2 IMPLANT
GLOVE BIOGEL PI INDICATOR 6.5 (GLOVE) ×1
GOWN STRL REUS W/TWL LRG LVL3 (GOWN DISPOSABLE) ×3 IMPLANT
GOWN STRL REUS W/TWL XL LVL3 (GOWN DISPOSABLE) ×3 IMPLANT
GOWN W/COTTON TOWEL STD LRG (GOWNS) IMPLANT
GOWN XL W/COTTON TOWEL STD (GOWNS) IMPLANT
LEGGING LITHOTOMY PAIR STRL (DRAPES) ×3 IMPLANT
PACK BASIN DAY SURGERY FS (CUSTOM PROCEDURE TRAY) ×3 IMPLANT
PAD OB MATERNITY 4.3X12.25 (PERSONAL CARE ITEMS) ×3 IMPLANT
PAD PREP 24X48 CUFFED NSTRL (MISCELLANEOUS) ×3 IMPLANT
SYR 50ML LL SCALE MARK (SYRINGE) ×3 IMPLANT
TOWEL OR 17X24 6PK STRL BLUE (TOWEL DISPOSABLE) ×6 IMPLANT
TRAY DSU PREP LF (CUSTOM PROCEDURE TRAY) ×3 IMPLANT
TUBE FEEDING 8FR 16IN STR KANG (MISCELLANEOUS) ×3 IMPLANT
WATER STERILE IRR 500ML POUR (IV SOLUTION) IMPLANT

## 2014-09-03 NOTE — Discharge Instructions (Addendum)
Call for pathology report on Thursday. Call for any problems.   Take antibiotic doxycycline one twice a day for 2 days.  Call for fever, severe pain or heavy bleeding. Light bleeding is expected for several days.   Set up follow up visit for 3 to 4 weeks.  Call [phone] for any problems.   Post Anesthesia Home Care Instructions  Activity: Get plenty of rest for the remainder of the day. A responsible adult should stay with you for 24 hours following the procedure.  For the next 24 hours, DO NOT: -Drive a car -Paediatric nurse -Drink alcoholic beverages -Take any medication unless instructed by your physician -Make any legal decisions or sign important papers.  Meals: Start with liquid foods such as gelatin or soup. Progress to regular foods as tolerated. Avoid greasy, spicy, heavy foods. If nausea and/or vomiting occur, drink only clear liquids until the nausea and/or vomiting subsides. Call your physician if vomiting continues.  Special Instructions/Symptoms: Your throat may feel dry or sore from the anesthesia or the breathing tube placed in your throat during surgery. If this causes discomfort, gargle with warm salt water. The discomfort should disappear within 24 hours.  If you had a scopolamine patch placed behind your ear for the management of post- operative nausea and/or vomiting:  1. The medication in the patch is effective for 72 hours, after which it should be removed.  Wrap patch in a tissue and discard in the trash. Wash hands thoroughly with soap and water. 2. You may remove the patch earlier than 72 hours if you experience unpleasant side effects which may include dry mouth, dizziness or visual disturbances. 3. Avoid touching the patch. Wash your hands with soap and water after contact with the patch.

## 2014-09-03 NOTE — H&P (Signed)
  Status unchanged and URI has resolved. Will proceed with planned D&C for post menopausal bleeding.

## 2014-09-03 NOTE — Anesthesia Procedure Notes (Signed)
Procedure Name: LMA Insertion Date/Time: 09/03/2014 12:43 PM Performed by: Wanita Chamberlain Pre-anesthesia Checklist: Patient identified, Timeout performed, Emergency Drugs available, Suction available and Patient being monitored Patient Re-evaluated:Patient Re-evaluated prior to inductionOxygen Delivery Method: Circle system utilized Intubation Type: IV induction Ventilation: Mask ventilation without difficulty LMA: LMA inserted LMA Size: 4.0 Number of attempts: 1 Placement Confirmation: positive ETCO2 and breath sounds checked- equal and bilateral Tube secured with: Tape

## 2014-09-03 NOTE — Brief Op Note (Signed)
09/03/2014  1:00 PM  PATIENT:  Kim Mclaughlin  69 y.o. female  PRE-OPERATIVE DIAGNOSIS:  POSTMENOPAUSAL BLEEDING  POST-OPERATIVE DIAGNOSIS:  POSTMENOPAUSAL BLEEDING  PROCEDURE:  Procedure(s): DILATATION AND CURETTAGE  (N/A)  SURGEON:  Surgeon(s) and Role:    * Harle Battiest, MD - Primary    ANESTHESIA:   general  EBL:  Total I/O In: 100 [I.V.:100] Out: -   BLOOD ADMINISTERED:none  DRAINS: none   LOCAL MEDICATIONS USED:  LIDOCAINE   SPECIMEN:  Source of Specimen:  endometrial currettings  DISPOSITION OF SPECIMEN:  PATHOLOGY  COUNTS:  YES  TOURNIQUET:  * No tourniquets in log *  DICTATION: .Other Dictation: Dictation Number [id number]  PLAN OF CARE: Discharge to home after PACU  PATIENT DISPOSITION:  PACU - hemodynamically stable.   Delay start of Pharmacological VTE agent (>24hrs) due to surgical blood loss or risk of bleeding: not applicable

## 2014-09-03 NOTE — Transfer of Care (Signed)
Immediate Anesthesia Transfer of Care Note  Patient: Kim Mclaughlin  Procedure(s) Performed: Procedure(s) (LRB): DILATATION AND CURETTAGE  (N/A)  Patient Location: PACU  Anesthesia Type: General  Level of Consciousness: awake, sedated, patient cooperative and responds to stimulation  Airway & Oxygen Therapy: Patient Spontanous Breathing and Patient connected to face mask oxygen  Post-op Assessment: Report given to PACU RN, Post -op Vital signs reviewed and stable and Patient moving all extremities  Post vital signs: Reviewed and stable  Complications: No apparent anesthesia complications

## 2014-09-03 NOTE — Anesthesia Preprocedure Evaluation (Addendum)
Anesthesia Evaluation  Patient identified by MRN, date of birth, ID band Patient awake    Reviewed: Allergy & Precautions, NPO status , Patient's Chart, lab work & pertinent test results  History of Anesthesia Complications (+) PONV  Airway Mallampati: II  TM Distance: >3 FB Neck ROM: Full    Dental no notable dental hx. (+) Teeth Intact, Caps, Dental Advisory Given,    Pulmonary former smoker,  breath sounds clear to auscultation  Pulmonary exam normal       Cardiovascular + Peripheral Vascular Disease and DVT Normal cardiovascular exam+ dysrhythmias + Valvular Problems/Murmurs MVP Rhythm:Regular Rate:Normal     Neuro/Psych PSYCHIATRIC DISORDERS negative neurological ROS  negative psych ROS   GI/Hepatic negative GI ROS, Neg liver ROS, GERD-  Medicated,  Endo/Other  negative endocrine ROS  Renal/GU negative Renal ROS  negative genitourinary   Musculoskeletal negative musculoskeletal ROS (+) Arthritis -,   Abdominal   Peds negative pediatric ROS (+)  Hematology negative hematology ROS (+)   Anesthesia Other Findings   Reproductive/Obstetrics negative OB ROS                         Anesthesia Physical Anesthesia Plan  ASA: II  Anesthesia Plan: General   Post-op Pain Management:    Induction: Intravenous  Airway Management Planned: LMA  Additional Equipment:   Intra-op Plan:   Post-operative Plan: Extubation in OR  Informed Consent: I have reviewed the patients History and Physical, chart, labs and discussed the procedure including the risks, benefits and alternatives for the proposed anesthesia with the patient or authorized representative who has indicated his/her understanding and acceptance.   Dental advisory given  Plan Discussed with: CRNA and Anesthesiologist  Anesthesia Plan Comments:        Anesthesia Quick Evaluation

## 2014-09-04 ENCOUNTER — Encounter (HOSPITAL_BASED_OUTPATIENT_CLINIC_OR_DEPARTMENT_OTHER): Payer: Self-pay | Admitting: Obstetrics and Gynecology

## 2014-09-04 NOTE — Op Note (Signed)
NAMEMarland Kitchen  JENTRI, AYE NO.:  [id number]  MEDICAL RECORD NO.:  [id number]  LOCATION:                                 FACILITY:  PHYSICIAN:  Gus Height, M.D.       DATE OF BIRTH:  July 06, 1945  DATE OF PROCEDURE:  09/03/2014 DATE OF DISCHARGE:  09/03/2014                              OPERATIVE REPORT   PREOPERATIVE DIAGNOSIS:  Postmenopausal bleeding.  POSTOPERATIVE DIAGNOSIS:  Postmenopausal bleeding.  PROCEDURE:  Dilatation and curettage.  SURGEON:  Gus Height, M.D.  ANESTHESIA:  General.  COMPLICATIONS:  None.  JUSTIFICATION:  The patient is a 69 year old, white female with history of postmenopausal bleeding.  Ultrasound examination in the office revealed a thickened endometrial stripe with fluid collection within the confines of the uterus and attempt was made to do an endometrial biopsy in the office, but because of vaginal stenosis and cervical stenosis, it was not possible, so she was brought to the operating room to undergo D and C under anesthesia.  Risks and benefits were discussed with the patient prior to the procedure.  DESCRIPTION OF PROCEDURE:  The patient was taken to the operating room, placed in supine position and general anesthesia was administered without difficulty.  She was then placed in dorsal lithotomy position, prepped and draped in usual sterile fashion.  A Sims retractors were placed in the vaginal vault.  Again, the apex of the vaginal vault was stenotic and the cervix was atrophic.  It was possible with a Primary school teacher and a Deaver to bring the cervix into view and grasped it with a tenaculum.  Once this was done, the endocervical canal was identified and dilated using serial Pratt dilators until a #23 Pratt dilator could be passed.  At this point, a small curette was placed through the cervix and a small amount of tissue was obtained as was a small amount of fluid and minimal tissue was sent for histologic study.  There was  a small tear because of stenotic cervix and located in the upper vagina and this was repaired with a single suture of 4-0 Vicryl with no other abnormalities being noted and the patient being stable.  The procedure was completed and she was reversed from the anesthetic and taken to recovery room in satisfactory condition.  PLAN:  For the pathology report to return before additional measures were taken.  The patient is to call on Thursday, Sep 05, 2014, for her pathology report.  She is to call for any problems such as fever, pain, or heavy bleeding.     Gus Height, M.D.     AR/MEDQ  D:  09/03/2014  T:  09/04/2014  Job:  [id number]

## 2014-09-04 NOTE — Anesthesia Postprocedure Evaluation (Signed)
  Anesthesia Post-op Note  Patient: Kim Mclaughlin  Procedure(s) Performed: Procedure(s) (LRB): DILATATION AND CURETTAGE (N/A)  Patient Location: PACU  Anesthesia Type: General  Level of Consciousness: awake and alert   Airway and Oxygen Therapy: Patient Spontanous Breathing  Post-op Pain: mild  Post-op Assessment: Post-op Vital signs reviewed, Patient's Cardiovascular Status Stable, Respiratory Function Stable, Patent Airway and No signs of Nausea or vomiting  Last Vitals:  Filed Vitals:   09/03/14 1500  BP: 137/80  Pulse: 71  Temp: 36.4 C  Resp: 18    Post-op Vital Signs: stable   Complications: No apparent anesthesia complications

## 2014-09-26 ENCOUNTER — Other Ambulatory Visit: Payer: Self-pay | Admitting: Obstetrics and Gynecology

## 2014-09-30 LAB — CYTOLOGY - PAP

## 2014-10-14 DIAGNOSIS — Z1231 Encounter for screening mammogram for malignant neoplasm of breast: Secondary | ICD-10-CM | POA: Diagnosis not present

## 2014-10-18 LAB — HM MAMMOGRAPHY

## 2014-10-25 ENCOUNTER — Telehealth: Payer: Self-pay | Admitting: Internal Medicine

## 2014-10-25 ENCOUNTER — Encounter: Payer: Self-pay | Admitting: Internal Medicine

## 2014-10-25 NOTE — Telephone Encounter (Signed)
Called patient to see if a recent mammogram has been done per patient recently done at [facility] will call to have report faxed

## 2014-12-26 DIAGNOSIS — D499 Neoplasm of unspecified behavior of unspecified site: Secondary | ICD-10-CM

## 2014-12-26 HISTORY — DX: Neoplasm of unspecified behavior of unspecified site: D49.9

## 2015-01-01 ENCOUNTER — Telehealth: Payer: Self-pay | Admitting: Internal Medicine

## 2015-01-01 NOTE — Telephone Encounter (Signed)
Nurse visit has been scheduled for pt and pts spouse

## 2015-01-01 NOTE — Telephone Encounter (Signed)
Pt called wondering if her and her spouse, Mr. Orianna Biskup ([birth year])  can go ahead and get the injection for Pneumococcal? Pt also wonder if she can get the flu injection at the same time. Please advise.

## 2015-01-08 ENCOUNTER — Ambulatory Visit (INDEPENDENT_AMBULATORY_CARE_PROVIDER_SITE_OTHER): Payer: Medicare PPO | Admitting: Geriatric Medicine

## 2015-01-08 DIAGNOSIS — Z23 Encounter for immunization: Secondary | ICD-10-CM

## 2015-01-10 DIAGNOSIS — L814 Other melanin hyperpigmentation: Secondary | ICD-10-CM | POA: Diagnosis not present

## 2015-01-10 DIAGNOSIS — D1801 Hemangioma of skin and subcutaneous tissue: Secondary | ICD-10-CM | POA: Diagnosis not present

## 2015-01-10 DIAGNOSIS — L821 Other seborrheic keratosis: Secondary | ICD-10-CM | POA: Diagnosis not present

## 2015-01-31 ENCOUNTER — Telehealth: Payer: Self-pay | Admitting: *Deleted

## 2015-01-31 MED ORDER — DICLOFENAC SODIUM 1 % TD GEL: 1.0000 "application " | Freq: Two times a day (BID) | TRANSDERMAL | Status: DC | PRN

## 2015-01-31 NOTE — Telephone Encounter (Signed)
Left msg on triage requesting refill on her voltaren gel. Called ot back no answer LMOM refill has been sent to [facility].../lmb

## 2015-04-11 ENCOUNTER — Telehealth: Payer: Self-pay

## 2015-04-11 NOTE — Telephone Encounter (Signed)
Call to schedule an AWV; agreed to come in with spouse 12/29 Thursday at [place].

## 2015-04-24 ENCOUNTER — Ambulatory Visit (INDEPENDENT_AMBULATORY_CARE_PROVIDER_SITE_OTHER): Payer: Medicare PPO

## 2015-04-24 VITALS — Ht 63.0 in | Wt 148.5 lb

## 2015-04-24 DIAGNOSIS — Z0000 Encounter for general adult medical examination without abnormal findings: Secondary | ICD-10-CM | POA: Diagnosis not present

## 2015-04-24 NOTE — Patient Instructions (Signed)
 Fall Prevention in the Home  Falls can cause injuries. They can happen to people of all ages. There are many things you can do to make your home safe and to help prevent falls.  WHAT CAN I DO ON THE OUTSIDE OF MY HOME?  Regularly fix the edges of walkways and driveways and fix any cracks.  Remove anything that might make you trip as you walk through a door, such as a raised step or threshold.  Trim any bushes or trees on the path to your home.  Use bright outdoor lighting.  Clear any walking paths of anything that might make someone trip, such as rocks or tools.  Regularly check to see if handrails are loose or broken. Make sure that both sides of any steps have handrails.  Any raised decks and porches should have guardrails on the edges.  Have any leaves, snow, or ice cleared regularly.  Use sand or salt on walking paths during winter.  Clean up any spills in your garage right away. This includes oil or grease spills. WHAT CAN I DO IN THE BATHROOM?   Use night lights.  Install grab bars by the toilet and in the tub and shower. Do not use towel bars as grab bars.  Use non-skid mats or decals in the tub or shower.  If you need to sit down in the shower, use a plastic, non-slip stool.  Keep the floor dry. Clean up any water that spills on the floor as soon as it happens.  Remove soap buildup in the tub or shower regularly.  Attach bath mats securely with double-sided non-slip rug tape.  Do not have throw rugs and other things on the floor that can make you trip. WHAT CAN I DO IN THE BEDROOM?  Use night lights.  Make sure that you have a light by your bed that is easy to reach.  Do not use any sheets or blankets that are too big for your bed. They should not hang down onto the floor.  Have a firm chair that has side arms. You can use this for support while you get dressed.  Do not have throw rugs and other things on the floor that can make you trip. WHAT CAN I DO  IN THE KITCHEN?  Clean up any spills right away.  Avoid walking on wet floors.  Keep items that you use a lot in easy-to-reach places.  If you need to reach something above you, use a strong step stool that has a grab bar.  Keep electrical cords out of the way.  Do not use floor polish or wax that makes floors slippery. If you must use wax, use non-skid floor wax.  Do not have throw rugs and other things on the floor that can make you trip. WHAT CAN I DO WITH MY STAIRS?  Do not leave any items on the stairs.  Make sure that there are handrails on both sides of the stairs and use them. Fix handrails that are broken or loose. Make sure that handrails are as long as the stairways.  Check any carpeting to make sure that it is firmly attached to the stairs. Fix any carpet that is loose or worn.  Avoid having throw rugs at the top or bottom of the stairs. If you do have throw rugs, attach them to the floor with carpet tape.  Make sure that you have a light switch at the top of the stairs and the bottom of the   stairs. If you do not have them, ask someone to add them for you. WHAT ELSE CAN I DO TO HELP PREVENT FALLS?  Wear shoes that:  Do not have high heels.  Have rubber bottoms.  Are comfortable and fit you well.  Are closed at the toe. Do not wear sandals.  If you use a stepladder:  Make sure that it is fully opened. Do not climb a closed stepladder.  Make sure that both sides of the stepladder are locked into place.  Ask someone to hold it for you, if possible.  Clearly mark and make sure that you can see:  Any grab bars or handrails.  First and last steps.  Where the edge of each step is.  Use tools that help you move around (mobility aids) if they are needed. These include:  Canes.  Walkers.  Scooters.  Crutches.  Turn on the lights when you go into a dark area. Replace any light bulbs as soon as they burn out.  Set up your furniture so you have a clear  path. Avoid moving your furniture around.  If any of your floors are uneven, fix them.  If there are any pets around you, be aware of where they are.  Review your medicines with your doctor. Some medicines can make you feel dizzy. This can increase your chance of falling. Ask your doctor what other things that you can do to help prevent falls.   This information is not intended to replace advice given to you by your health care provider. Make sure you discuss any questions you have with your health care provider.   Document Released: 02/06/2009 Document Revised: 08/27/2014 Document Reviewed: 05/17/2014 [employer] Interactive Patient Education 2016 [employer]  Health Maintenance, Female Adopting a healthy lifestyle and getting preventive care can go a long way to promote health and wellness. Talk with your health care provider about what schedule of regular examinations is right for you. This is a good chance for you to check in with your provider about disease prevention and staying healthy. In between checkups, there are plenty of things you can do on your own. Experts have done a lot of research about which lifestyle changes and preventive measures are most likely to keep you healthy. Ask your health care provider for more information. WEIGHT AND DIET  Eat a healthy diet  Be sure to include plenty of vegetables, fruits, low-fat dairy products, and lean protein.  Do not eat a lot of foods high in solid fats, added sugars, or salt.  Get regular exercise. This is one of the most important things you can do for your health.  Most adults should exercise for at least 150 minutes each week. The exercise should increase your heart rate and make you sweat (moderate-intensity exercise).  Most adults should also do strengthening exercises at least twice a week. This is in addition to the moderate-intensity exercise.  Maintain a healthy weight  Body mass index (BMI) is a measurement that can  be used to identify possible weight problems. It estimates body fat based on height and weight. Your health care provider can help determine your BMI and help you achieve or maintain a healthy weight.  For females 20 years of age and older:   A BMI below 18.5 is considered underweight.  A BMI of 18.5 to 24.9 is normal.  A BMI of 25 to 29.9 is considered overweight.  A BMI of 30 and above is considered obese.  Watch levels   of cholesterol and blood lipids  You should start having your blood tested for lipids and cholesterol at 69 years of age, then have this test every 5 years.  You may need to have your cholesterol levels checked more often if:  Your lipid or cholesterol levels are high.  You are older than 69 years of age.  You are at high risk for heart disease.  CANCER SCREENING   Lung Cancer  Lung cancer screening is recommended for adults 55-80 years old who are at high risk for lung cancer because of a history of smoking.  A yearly low-dose CT scan of the lungs is recommended for people who:  Currently smoke.  Have quit within the past 15 years.  Have at least a 30-pack-year history of smoking. A pack year is smoking an average of one pack of cigarettes a day for 1 year.  Yearly screening should continue until it has been 15 years since you quit.  Yearly screening should stop if you develop a health problem that would prevent you from having lung cancer treatment.  Breast Cancer  Practice breast self-awareness. This means understanding how your breasts normally appear and feel.  It also means doing regular breast self-exams. Let your health care provider know about any changes, no matter how small.  If you are in your 20s or 30s, you should have a clinical breast exam (CBE) by a health care provider every 1-3 years as part of a regular health exam.  If you are 40 or older, have a CBE every year. Also consider having a breast X-ray (mammogram) every year.  If  you have a family history of breast cancer, talk to your health care provider about genetic screening.  If you are at high risk for breast cancer, talk to your health care provider about having an MRI and a mammogram every year.  Breast cancer gene (BRCA) assessment is recommended for women who have family members with BRCA-related cancers. BRCA-related cancers include:  Breast.  Ovarian.  Tubal.  Peritoneal cancers.  Results of the assessment will determine the need for genetic counseling and BRCA1 and BRCA2 testing. Cervical Cancer Your health care provider may recommend that you be screened regularly for cancer of the pelvic organs (ovaries, uterus, and vagina). This screening involves a pelvic examination, including checking for microscopic changes to the surface of your cervix (Pap test). You may be encouraged to have this screening done every 3 years, beginning at age 21.  For women ages 30-65, health care providers may recommend pelvic exams and Pap testing every 3 years, or they may recommend the Pap and pelvic exam, combined with testing for human papilloma virus (HPV), every 5 years. Some types of HPV increase your risk of cervical cancer. Testing for HPV may also be done on women of any age with unclear Pap test results.  Other health care providers may not recommend any screening for nonpregnant women who are considered low risk for pelvic cancer and who do not have symptoms. Ask your health care provider if a screening pelvic exam is right for you.  If you have had past treatment for cervical cancer or a condition that could lead to cancer, you need Pap tests and screening for cancer for at least 20 years after your treatment. If Pap tests have been discontinued, your risk factors (such as having a new sexual partner) need to be reassessed to determine if screening should resume. Some women have medical problems that increase the chance   of getting cervical cancer. In these cases,  your health care provider may recommend more frequent screening and Pap tests. Colorectal Cancer  This type of cancer can be detected and often prevented.  Routine colorectal cancer screening usually begins at 69 years of age and continues through 69 years of age.  Your health care provider may recommend screening at an earlier age if you have risk factors for colon cancer.  Your health care provider may also recommend using home test kits to check for hidden blood in the stool.  A small camera at the end of a tube can be used to examine your colon directly (sigmoidoscopy or colonoscopy). This is done to check for the earliest forms of colorectal cancer.  Routine screening usually begins at age 50.  Direct examination of the colon should be repeated every 5-10 years through 69 years of age. However, you may need to be screened more often if early forms of precancerous polyps or small growths are found. Skin Cancer  Check your skin from head to toe regularly.  Tell your health care provider about any new moles or changes in moles, especially if there is a change in a mole's shape or color.  Also tell your health care provider if you have a mole that is larger than the size of a pencil eraser.  Always use sunscreen. Apply sunscreen liberally and repeatedly throughout the day.  Protect yourself by wearing long sleeves, pants, a wide-brimmed hat, and sunglasses whenever you are outside. HEART DISEASE, DIABETES, AND HIGH BLOOD PRESSURE   High blood pressure causes heart disease and increases the risk of stroke. High blood pressure is more likely to develop in:  People who have blood pressure in the high end of the normal range (130-139/85-89 mm Hg).  People who are overweight or obese.  People who are African American.  If you are 18-39 years of age, have your blood pressure checked every 3-5 years. If you are 40 years of age or older, have your blood pressure checked every year. You  should have your blood pressure measured twice--once when you are at a hospital or clinic, and once when you are not at a hospital or clinic. Record the average of the two measurements. To check your blood pressure when you are not at a hospital or clinic, you can use:  An automated blood pressure machine at a pharmacy.  A home blood pressure monitor.  If you are between 55 years and 79 years old, ask your health care provider if you should take aspirin to prevent strokes.  Have regular diabetes screenings. This involves taking a blood sample to check your fasting blood sugar level.  If you are at a normal weight and have a low risk for diabetes, have this test once every three years after 69 years of age.  If you are overweight and have a high risk for diabetes, consider being tested at a younger age or more often. PREVENTING INFECTION  Hepatitis B  If you have a higher risk for hepatitis B, you should be screened for this virus. You are considered at high risk for hepatitis B if:  You were born in a country where hepatitis B is common. Ask your health care provider which countries are considered high risk.  Your parents were born in a high-risk country, and you have not been immunized against hepatitis B (hepatitis B vaccine).  You have HIV or AIDS.  You use needles to inject street drugs.  You   live with someone who has hepatitis B.  You have had sex with someone who has hepatitis B.  You get hemodialysis treatment.  You take certain medicines for conditions, including cancer, organ transplantation, and autoimmune conditions. Hepatitis C  Blood testing is recommended for:  Everyone born from 1945 through 1965.  Anyone with known risk factors for hepatitis C. Sexually transmitted infections (STIs)  You should be screened for sexually transmitted infections (STIs) including gonorrhea and chlamydia if:  You are sexually active and are younger than 69 years of age.  You  are older than 69 years of age and your health care provider tells you that you are at risk for this type of infection.  Your sexual activity has changed since you were last screened and you are at an increased risk for chlamydia or gonorrhea. Ask your health care provider if you are at risk.  If you do not have HIV, but are at risk, it may be recommended that you take a prescription medicine daily to prevent HIV infection. This is called pre-exposure prophylaxis (PrEP). You are considered at risk if:  You are sexually active and do not regularly use condoms or know the HIV status of your partner(s).  You take drugs by injection.  You are sexually active with a partner who has HIV. Talk with your health care provider about whether you are at high risk of being infected with HIV. If you choose to begin PrEP, you should first be tested for HIV. You should then be tested every 3 months for as long as you are taking PrEP.  PREGNANCY   If you are premenopausal and you may become pregnant, ask your health care provider about preconception counseling.  If you may become pregnant, take 400 to 800 micrograms (mcg) of folic acid every day.  If you want to prevent pregnancy, talk to your health care provider about birth control (contraception). OSTEOPOROSIS AND MENOPAUSE   Osteoporosis is a disease in which the bones lose minerals and strength with aging. This can result in serious bone fractures. Your risk for osteoporosis can be identified using a bone density scan.  If you are 65 years of age or older, or if you are at risk for osteoporosis and fractures, ask your health care provider if you should be screened.  Ask your health care provider whether you should take a calcium or vitamin D supplement to lower your risk for osteoporosis.  Menopause may have certain physical symptoms and risks.  Hormone replacement therapy may reduce some of these symptoms and risks. Talk to your health care  provider about whether hormone replacement therapy is right for you.  HOME CARE INSTRUCTIONS   Schedule regular health, dental, and eye exams.  Stay current with your immunizations.   Do not use any tobacco products including cigarettes, chewing tobacco, or electronic cigarettes.  If you are pregnant, do not drink alcohol.  If you are breastfeeding, limit how much and how often you drink alcohol.  Limit alcohol intake to no more than 1 drink per day for nonpregnant women. One drink equals 12 ounces of beer, 5 ounces of wine, or 1 ounces of hard liquor.  Do not use street drugs.  Do not share needles.  Ask your health care provider for help if you need support or information about quitting drugs.  Tell your health care provider if you often feel depressed.  Tell your health care provider if you have ever been abused or do not feel safe   at home.   This information is not intended to replace advice given to you by your health care provider. Make sure you discuss any questions you have with your health care provider.   Document Released: 10/26/2010 Document Revised: 05/03/2014 Document Reviewed: 03/14/2013 [employer] Interactive Patient Education 2016 [employer]  

## 2015-04-24 NOTE — Progress Notes (Addendum)
Subjective:   Kim Mclaughlin is a 69 y.o. female who presents for Medicare Annual (Subsequent) preventive examination.  Review of Systems:   Cardiac Risk Factors include: advanced age (>14men, >75 women);dyslipidemia;family history of premature cardiovascular disease HRA assessment completed during visit; Lewis Moccasin The Patient was informed that this wellness visit is to identify risk and educate on how to reduce risk for increase disease through lifestyle changes.   ROS deferred to CPE exam with physician Father had heart disease  Medical issues include varicose veins with ablation; Mitral valve prolapse Osteoporosis; vit d 62 07/2014; could not tol meds and is taking Vit D Hyperlipidemia on 07/2014; chol 228; trig 72; HDL 68; LDL 145; ratio 3 Glucose 110 08/2014;   BMI: 26.2;  Diet; Diet; tried to eat healthy;NO HFCS and checks products  Using flat bread; years ago gave up sugar; eat sweets but does bake; not processed  Loves chocolate; doesn't eat cookies often Eat more salads; bowl of cereal for breakfast, eggs and oatmeal and coffee Sweet potatoes and less potatoes; full meal at dinner;  Sandwich at lunch; yogurt; fruit  Sticks with mediterranean  diet  Exercise; goes to the gym; will go back 3 times as week as stopped through [place]' 30 minutes; 3 times per week  HDL good; weight 26; which is now acceptable according to the NIH BMI guidelines for seniors.   Safety reviewed; discussed down sizing; may need to get yard assistance at some point.  Safety reviewed for the home; including removal of clutter; clear paths through the home, eliminating clutter, railing as needed; bathroom safety; free standing shower with grab  bars; ; community safety; smoke detectors and firearms safety reviewed  Driving accidents no and seatbelt yes Sun protection yes Stressors; no 1-5; 1 generally Medication review/ New meds/ no changes Recommended at least 1000 u Vit  D  Fall assessment; no Gait assessment/ normal   Mobilization and Functional losses in the last year./ no  Sleep patterns; sleeps well   Urinary or fecal incontinence reviewed/ no  Counseling: Hep C due / educated and will fup w next blood draw Colonoscopy; 07/2012 normally is 5 years; 07/2017 (cancer in the family)  EKG: 07/2014 Hearing: hearing loss right ear; strirrup bone was fixed; staple surgery; in accident; knocked out  Dexa 05/2013 r femor -2.6  due again 05/2015 / took Fosamax; made chins hurt;  Vit D with calcium (recommended 1000 of Vit d; to check to see what she is taking)   Mammagram 09/2014 no suspicious finding for malignancy Ophthalmology exam; due Jan;  Immunizations: none currently due  Current Care Team reviewed and updated      Objective:     Vitals: Ht 5\' 3"  (1.6 m)  Wt 148 lb 8 oz (67.359 kg)  BMI 26.31 kg/m2  Tobacco History  Smoking status  . Former Smoker  . Quit date: 04/27/1963  Smokeless tobacco  . Never Used    Comment: Smoked < 6 mos;< 1 pack total;quit 1965     Counseling given: Not Answered   Past Medical History  Diagnosis Date  . GERD (gastroesophageal reflux disease)   . Hyperlipidemia   . Hepatitis     in 10 th grade  . Osteopenia     [facility]  . Varicose veins   . DVT (deep venous thrombosis)   . Arthritis 07/2014    Right hand, prescribed prednisone  . Precancerous lesion 12/2014    Treated with cream only  .  MVP (mitral valve prolapse)   . PONV (postoperative nausea and vomiting)     with ear surgery only   Past Surgical History  Procedure Laterality Date  . Appendectomy    . Tubal ligation    . Stapedectomy    . G 3 p 3    . Dilation and curettage of uterus    . Colonoscopy with polypectomy  07/2012     hyperplastic;Dr Olevia Perches  . Endovenous ablation saphenous vein w/ laser Left 01-31-2014    EVLA  left greater saphenous vein  by Curt Jews MD  . Endovenous ablation saphenous vein w/ laser Right 03-14-2014     endovenous laser ablation right greater saphenous vein  by Curt Jews MD  . Dilation and curettage of uterus N/A 09/03/2014    Procedure: DILATATION AND CURETTAGE;  Surgeon: Harle Battiest, MD;  Location: [facility];  Service: Gynecology;  Laterality: N/A;   Family History  Problem Relation Age of Onset  . Pancreatic cancer Mother   . Miscarriages / [place] Mother   . Thyroid disease Mother     partial thyroidectomy ; S/P RAI  . Heart attack Father     in early 36s  . Colon cancer Father   . Colon cancer Paternal Grandmother   . Stroke Paternal Grandmother     > 65  . Heart attack Paternal Grandfather     in 29s  . Cancer      among paternal sibs (colon, breast, bone)  . Aneurysm Paternal Uncle     brain  . Ovarian cancer Maternal Aunt   . Diabetes Neg Hx   . Breast cancer Paternal Aunt    History  Sexual Activity  . Sexual Activity: Not on file    Outpatient Encounter Prescriptions as of 04/24/2015  Medication Sig  . aspirin 81 MG tablet Take 81 mg by mouth daily.   . Bepotastine Besilate 1.5 % SOLN Apply 1 drop to eye daily as needed (allergies).  . calcium citrate-vitamin D 200-200 MG-UNIT TABS Take 2 tablets by mouth 2 (two) times daily.   . diclofenac sodium (VOLTAREN) 1 % GEL Apply 1 application topically 2 (two) times daily as needed.  Marland Kitchen GNP KRILL OIL OMEGA-3 PO Take 2 capsules by mouth daily.  Marland Kitchen loratadine (CLARITIN) 10 MG tablet Take 10 mg by mouth daily.  . Misc Natural Products (OSTEO BI-FLEX JOINT SHIELD PO) Take 2 tablets by mouth 2 (two) times daily.  . Misc Natural Products (TART CHERRY ADVANCED) CAPS Take 3 capsules by mouth daily.  . Multiple Minerals-Vitamins (CAL-MAG-ZINC-D PO) Take 1 tablet by mouth 2 (two) times daily.   . Multiple Vitamins-Minerals (MULTIVITAMIN PO) Take 1 tablet by mouth daily.    Marland Kitchen tolnaftate (TINACTIN) 1 % cream Apply 1 application topically 2 (two) times daily.  Marland Kitchen triamcinolone (NASACORT) 55 MCG/ACT AERO nasal  inhaler Place 2 sprays into the nose daily as needed (allergies).   Marland Kitchen BIOTIN PO Take 1 capsule by mouth daily. Reported on 04/24/2015   No facility-administered encounter medications on file as of 04/24/2015.    Activities of Daily Living In your present state of health, do you have any difficulty performing the following activities: 04/24/2015 09/03/2014  Hearing? N Y  Vision? N Y  Difficulty concentrating or making decisions? N N  Walking or climbing stairs? N N  Dressing or bathing? N N  Doing errands, shopping? N -  Preparing Food and eating ? N -  Using the Toilet? N -  In the past six months, have you accidently leaked urine? N -  Do you have problems with loss of bowel control? N -  Managing your Medications? N -  Managing your Finances? N -  Housekeeping or managing your Housekeeping? N -    Patient Care Team: Hendricks Limes, MD as PCP - General    Assessment:    Assessment   Patient presents for yearly preventative medicine examination. Medicare questionnaire screening were completed, i.e. Functional; fall risk; depression, memory loss and hearing; States she does have hearing loss in right ear and has had surgery and follows up for hearing screens;  All immunizations and health maintenance protocols were reviewed and up to date at this time  Education provided for laboratory screens;  Educated regarding hep c screening  Medication reconciliation, past medical history, social history, problem list and allergies were reviewed in detail with the patient  Goals were established with regard to going back to the gym;   End of life planning was discussed and has been completed   Exercise Activities and Dietary recommendations has arthritis of knees and likes to walk; uses the treadmill in the winter; walks 30 min 3 times per week   Current Exercise Habits:: Structured exercise class, Type of exercise: treadmill, Time (Minutes): 30, Frequency (Times/Week): 3, Weekly  Exercise (Minutes/Week): 90, Intensity: Moderate  Goals is to start back at the gym;   Goals    . Exercise 150 minutes per week (moderate activity)     Goal is to start walking again at the y       Fall Risk Fall Risk  04/24/2015 08/20/2014 08/08/2012  Falls in the past year? No No Yes  Number falls in past yr: - - 1  Injury with Fall? - - No   Depression Screen PHQ 2/9 Scores 04/24/2015 08/20/2014 08/08/2012  PHQ - 2 Score 0 0 0     Cognitive Testing No flowsheet data found.   Ad8 score 0   No issues or concerns verbalized; Educated on normal and abnormal memory issues  Immunization History  Administered Date(s) Administered  . Influenza Whole 05/16/2012  . Influenza, High Dose Seasonal PF 02/21/2014  . Influenza-Unspecified 01/29/2015  . Pneumococcal Conjugate-13 01/08/2015  . Pneumococcal Polysaccharide-23 06/11/2011  . Td 01/25/2008  . Zoster 12/13/2012   Screening Tests Health Maintenance  Topic Date Due  . Hepatitis C Screening  [birth year]  . PAP SMEAR  09/26/2015  . INFLUENZA VACCINE  11/25/2015  . MAMMOGRAM  10/17/2016  . TETANUS/TDAP  01/24/2018  . COLONOSCOPY  08/19/2022  . DEXA SCAN  Completed  . ZOSTAVAX  Completed  . PNA vac Low Risk Adult  Completed      Plan:   To continue healthy diet and get back to exercise; Very watchful concerning diet  During the course of the visit the patient was educated and counseled about the following appropriate screening and preventive services:   Vaccines to include Pneumoccal, Influenza, Hepatitis B, Td, Zostavax, HCV  Electrocardiogram - 07/2012  Cardiovascular Disease/ BP normal / hdl 68  Colorectal cancer screening; due 05/2015  Bone density screening/ states she could not tolerate   Diabetes screening/ n/a   Glaucoma screening/ Due in January  Mammography; 09/2014 - normal  Nutrition counseling; monitors nutrition; BMI normal   Patient Instructions (the written plan) was given to the patient.     Wynetta Fines, RN  04/24/2015   Medical screening examination/treatment/procedure(s) were performed by non-physician practitioner and as supervising physician I  was immediately available for consultation/collaboration. I agree with above. Cathlean Cower, MD

## 2015-05-14 ENCOUNTER — Telehealth: Payer: Self-pay | Admitting: Internal Medicine

## 2015-05-14 NOTE — Telephone Encounter (Signed)
Patient is calling on behalf of herself and her husband Kim Mclaughlin. They are requesting to be under your care with dr hoppers retirement. Is this something that you are ok with ?

## 2015-05-14 NOTE — Telephone Encounter (Signed)
Ok to both 

## 2015-05-21 NOTE — Telephone Encounter (Signed)
Got both scheduled

## 2015-05-27 ENCOUNTER — Telehealth: Payer: Self-pay | Admitting: Internal Medicine

## 2015-05-27 DIAGNOSIS — Z13820 Encounter for screening for osteoporosis: Secondary | ICD-10-CM

## 2015-05-27 NOTE — Telephone Encounter (Signed)
Ok order done 

## 2015-05-27 NOTE — Telephone Encounter (Signed)
Pt has a bone density test on 2/24 at [facility] and they are requiring a referral from her PCP.  She was a Dr. Linna Darner pt but she is transferring to Dr. Jenny Reichmann in March. Will he be able to send the referral in?

## 2015-05-27 NOTE — Telephone Encounter (Signed)
Please advise patient is transferring to you, her bone density is 2/24 and they need a referral.

## 2015-06-20 LAB — HM DEXA SCAN: HM Dexa Scan: -1.5

## 2015-06-27 ENCOUNTER — Encounter: Payer: Self-pay | Admitting: Internal Medicine

## 2015-06-30 ENCOUNTER — Encounter: Payer: Self-pay | Admitting: Internal Medicine

## 2015-06-30 ENCOUNTER — Other Ambulatory Visit (INDEPENDENT_AMBULATORY_CARE_PROVIDER_SITE_OTHER): Payer: Medicare Other

## 2015-06-30 ENCOUNTER — Other Ambulatory Visit: Payer: Self-pay | Admitting: Internal Medicine

## 2015-06-30 ENCOUNTER — Ambulatory Visit (INDEPENDENT_AMBULATORY_CARE_PROVIDER_SITE_OTHER): Payer: Medicare Other | Admitting: Internal Medicine

## 2015-06-30 VITALS — BP 122/80 | HR 73 | Temp 97.7°F | Resp 20 | Wt 151.5 lb

## 2015-06-30 DIAGNOSIS — I872 Venous insufficiency (chronic) (peripheral): Secondary | ICD-10-CM

## 2015-06-30 DIAGNOSIS — Z0189 Encounter for other specified special examinations: Secondary | ICD-10-CM | POA: Diagnosis not present

## 2015-06-30 DIAGNOSIS — M810 Age-related osteoporosis without current pathological fracture: Secondary | ICD-10-CM

## 2015-06-30 DIAGNOSIS — Z0000 Encounter for general adult medical examination without abnormal findings: Secondary | ICD-10-CM

## 2015-06-30 DIAGNOSIS — E785 Hyperlipidemia, unspecified: Secondary | ICD-10-CM

## 2015-06-30 LAB — CBC WITH DIFFERENTIAL/PLATELET
Basophils Absolute: 0 10*3/uL (ref 0.0–0.1)
Basophils Relative: 0.6 % (ref 0.0–3.0)
EOS PCT: 7.4 % — AB (ref 0.0–5.0)
Eosinophils Absolute: 0.4 10*3/uL (ref 0.0–0.7)
HCT: 40.7 % (ref 36.0–46.0)
HEMOGLOBIN: 13.8 g/dL (ref 12.0–15.0)
Lymphocytes Relative: 35.7 % (ref 12.0–46.0)
Lymphs Abs: 2.1 10*3/uL (ref 0.7–4.0)
MCHC: 34 g/dL (ref 30.0–36.0)
MCV: 90.2 fl (ref 78.0–100.0)
MONOS PCT: 7.3 % (ref 3.0–12.0)
Monocytes Absolute: 0.4 10*3/uL (ref 0.1–1.0)
Neutro Abs: 2.8 10*3/uL (ref 1.4–7.7)
Neutrophils Relative %: 49 % (ref 43.0–77.0)
Platelets: 313 10*3/uL (ref 150.0–400.0)
RBC: 4.51 Mil/uL (ref 3.87–5.11)
RDW: 13.5 % (ref 11.5–15.5)
WBC: 5.8 10*3/uL (ref 4.0–10.5)

## 2015-06-30 LAB — URINALYSIS, ROUTINE W REFLEX MICROSCOPIC
BILIRUBIN URINE: NEGATIVE
Ketones, ur: NEGATIVE
LEUKOCYTES UA: NEGATIVE
NITRITE: NEGATIVE
Specific Gravity, Urine: 1.01 (ref 1.000–1.030)
Total Protein, Urine: NEGATIVE
Urine Glucose: NEGATIVE
Urobilinogen, UA: 0.2 (ref 0.0–1.0)
pH: 6 (ref 5.0–8.0)

## 2015-06-30 LAB — BASIC METABOLIC PANEL
BUN: 11 mg/dL (ref 6–23)
CHLORIDE: 105 meq/L (ref 96–112)
CO2: 28 mEq/L (ref 19–32)
Calcium: 9.8 mg/dL (ref 8.4–10.5)
Creatinine, Ser: 0.9 mg/dL (ref 0.40–1.20)
GFR: 65.77 mL/min (ref >60.00)
GLUCOSE: 99 mg/dL (ref 70–99)
POTASSIUM: 4.5 meq/L (ref 3.5–5.1)
SODIUM: 140 meq/L (ref 135–145)

## 2015-06-30 LAB — LIPID PANEL
Cholesterol: 239 mg/dL — ABNORMAL HIGH (ref 0–200)
HDL: 68.8 mg/dL (ref >39.00)
LDL CALC: 153 mg/dL — AB (ref 0–99)
NonHDL: 170.64
Total CHOL/HDL Ratio: 3
Triglycerides: 88 mg/dL (ref 0.0–149.0)
VLDL: 17.6 mg/dL (ref 0.0–40.0)

## 2015-06-30 LAB — TSH: TSH: 1.3 u[IU]/mL (ref 0.35–4.50)

## 2015-06-30 LAB — HEPATIC FUNCTION PANEL
ALK PHOS: 73 U/L (ref 39–117)
ALT: 16 U/L (ref 0–35)
AST: 18 U/L (ref 0–37)
Albumin: 4.3 g/dL (ref 3.5–5.2)
BILIRUBIN TOTAL: 0.5 mg/dL (ref 0.2–1.2)
Bilirubin, Direct: 0.1 mg/dL (ref 0.0–0.3)
Total Protein: 7.2 g/dL (ref 6.0–8.3)

## 2015-06-30 LAB — HEPATITIS C ANTIBODY: HCV Ab: NEGATIVE

## 2015-06-30 MED ORDER — ROSUVASTATIN CALCIUM 10 MG PO TABS: 10.0000 mg | ORAL_TABLET | Freq: Every day | ORAL | Status: DC

## 2015-06-30 NOTE — Assessment & Plan Note (Signed)
Had recent dxa - will need to get results from [facility] as is not currently on chart

## 2015-06-30 NOTE — Patient Instructions (Signed)
Please continue all other medications as before, and refills have been done if requested.  Please have the pharmacy call with any other refills you may need.  Please continue your efforts at being more active, low cholesterol diet, and weight control.  You are otherwise up to date with prevention measures today.  Please keep your appointments with your specialists as you may have planned  We will try to get the results of your recent DXA scan from [place]  Please go to the LAB in the Basement (turn left off the elevator) for the tests to be done today  You will be contacted by phone if any changes need to be made immediately.  Otherwise, you will receive a letter about your results with an explanation, but please check with MyChart first.  Please remember to sign up for MyChart if you have not done so, as this will be important to you in the future with finding out test results, communicating by private email, and scheduling acute appointments online when needed.  Please return in 1 year for your yearly visit, or sooner if needed, with Lab testing done 3-5 days before

## 2015-06-30 NOTE — Assessment & Plan Note (Signed)

## 2015-06-30 NOTE — Assessment & Plan Note (Signed)
ECG reviewed as per emr, stable overall by history and exam, recent data reviewed with pt, and pt to continue medical treatment as before,  to f/u any worsening symptoms or concerns Lab Results  Component Value Date   LDLCALC 145* 08/20/2014   For fu lab today

## 2015-06-30 NOTE — Progress Notes (Signed)
Subjective:    Patient ID: Kim Mclaughlin, female    DOB: October 15, 1945, 70 y.o.   MRN: [id number]  HPI   Here for wellness and f/u;  Overall doing ok;  Pt denies Chest pain, worsening SOB, DOE, wheezing, orthopnea, PND, worsening LE edema, palpitations, dizziness or syncope.  Pt denies neurological change such as new headache, facial or extremity weakness.  Pt denies polydipsia, polyuria, or low sugar symptoms. Pt states overall good compliance with treatment and medications, good tolerability, and has been trying to follow appropriate diet.  Pt denies worsening depressive symptoms, suicidal ideation or panic. No fever, night sweats, wt loss, loss of appetite, or other constitutional symptoms.  Pt states good ability with ADL's, has low fall risk, home safety reviewed and adequate, no other significant changes in hearing or vision, and only occasionally active with exercise. s/p laser ablation for varicose tcx in the past.  No current complaints  Tolerating current meds, admits to some dietary noncompliacne recently, due for lipid check. Has numerous LE varicosities but no pain recent or ulcers.  Did have recent DXA scan but results not on chart.  Also due for Hep C check Past Medical History  Diagnosis Date  . GERD (gastroesophageal reflux disease)   . Hyperlipidemia   . Hepatitis     in 10 th grade  . Osteopenia     [facility]  . Varicose veins   . DVT (deep venous thrombosis) ([place])   . Arthritis 07/2014    Right hand, prescribed prednisone  . Precancerous lesion 12/2014    Treated with cream only  . MVP (mitral valve prolapse)   . PONV (postoperative nausea and vomiting)     with ear surgery only   Past Surgical History  Procedure Laterality Date  . Appendectomy    . Tubal ligation    . Stapedectomy    . G 3 p 3    . Dilation and curettage of uterus    . Colonoscopy with polypectomy  07/2012     hyperplastic;Dr Olevia Perches  . Endovenous ablation saphenous vein w/ laser Left 01-31-2014   EVLA  left greater saphenous vein  by Curt Jews MD  . Endovenous ablation saphenous vein w/ laser Right 03-14-2014    endovenous laser ablation right greater saphenous vein  by Curt Jews MD  . Dilation and curettage of uterus N/A 09/03/2014    Procedure: DILATATION AND CURETTAGE;  Surgeon: Harle Battiest, MD;  Location: [facility];  Service: Gynecology;  Laterality: N/A;    reports that she quit smoking about 52 years ago. She has never used smokeless tobacco. She reports that she does not drink alcohol or use illicit drugs. family history includes Aneurysm in her paternal uncle; Breast cancer in her paternal aunt; Colon cancer in her father and paternal grandmother; Heart attack in her father and paternal grandfather; Miscarriages / [place] in her mother; Ovarian cancer in her maternal aunt; Pancreatic cancer in her mother; Stroke in her paternal grandmother; Thyroid disease in her mother. There is no history of Diabetes. Allergies  Allergen Reactions  . Ciprofloxacin     ? reaction; no rash, fever or angioedema  . Methylphenidate Hcl   . Pravastatin Sodium     REACTION: shin pain  . Amoxicillin-Pot Clavulanate     GI symptoms; abdominal pain & nausea NOTE: able to take Ampicillin  . Augmentin [Amoxicillin-Pot Clavulanate] Nausea Only    Upsets stomach  . Codeine Nausea Only  . Sulfamethoxazole-Trimethoprim Nausea Only  nausea   Current Outpatient Prescriptions on File Prior to Visit  Medication Sig Dispense Refill  . aspirin 81 MG tablet Take 81 mg by mouth daily.     . Bepotastine Besilate 1.5 % SOLN Apply 1 drop to eye daily as needed (allergies).    Marland Kitchen BIOTIN PO Take 1 capsule by mouth daily. Reported on 04/24/2015    . calcium citrate-vitamin D 200-200 MG-UNIT TABS Take 2 tablets by mouth 2 (two) times daily.     . diclofenac sodium (VOLTAREN) 1 % GEL Apply 1 application topically 2 (two) times daily as needed. 1 Tube 2  . GNP KRILL OIL OMEGA-3 PO Take 2  capsules by mouth daily.    Marland Kitchen loratadine (CLARITIN) 10 MG tablet Take 10 mg by mouth daily.    . Misc Natural Products (OSTEO BI-FLEX JOINT SHIELD PO) Take 2 tablets by mouth 2 (two) times daily.    . Misc Natural Products (TART CHERRY ADVANCED) CAPS Take 3 capsules by mouth daily.    . Multiple Minerals-Vitamins (CAL-MAG-ZINC-D PO) Take 1 tablet by mouth 2 (two) times daily.     . Multiple Vitamins-Minerals (MULTIVITAMIN PO) Take 1 tablet by mouth daily.      Marland Kitchen tolnaftate (TINACTIN) 1 % cream Apply 1 application topically 2 (two) times daily.    Marland Kitchen triamcinolone (NASACORT) 55 MCG/ACT AERO nasal inhaler Place 2 sprays into the nose daily as needed (allergies).      No current facility-administered medications on file prior to visit.   Review of Systems Constitutional: Negative for increased diaphoresis, other activity, appetite or siginficant weight change other than noted HENT: Negative for worsening hearing loss, ear pain, facial swelling, mouth sores and neck stiffness.   Eyes: Negative for other worsening pain, redness or visual disturbance.  Respiratory: Negative for shortness of breath and wheezing  Cardiovascular: Negative for chest pain and palpitations.  Gastrointestinal: Negative for diarrhea, blood in stool, abdominal distention or other pain Genitourinary: Negative for hematuria, flank pain or change in urine volume.  Musculoskeletal: Negative for myalgias or other joint complaints.  Skin: Negative for color change and wound or drainage.  Neurological: Negative for syncope and numbness. other than noted Hematological: Negative for adenopathy. or other swelling Psychiatric/Behavioral: Negative for hallucinations, SI, self-injury, decreased concentration or other worsening agitation.      Objective:   Physical Exam BP 122/80 mmHg  Pulse 73  Temp(Src) 97.7 F (36.5 C) (Oral)  Resp 20  Wt 151 lb 8 oz (68.72 kg)  SpO2 98% VS noted,  Constitutional: Pt is oriented to person,  place, and time. Appears well-developed and well-nourished, in no significant distress Head: Normocephalic and atraumatic.  Right Ear: External ear normal.  Left Ear: External ear normal.  Nose: Nose normal.  Mouth/Throat: Oropharynx is clear and moist.  Eyes: Conjunctivae and EOM are normal. Pupils are equal, round, and reactive to light.  Neck: Normal range of motion. Neck supple. No JVD present. No tracheal deviation present or significant neck LA or mass Cardiovascular: Normal rate, regular rhythm, normal heart sounds and intact distal pulses.   Pulmonary/Chest: Effort normal and breath sounds without rales or wheezing  Abdominal: Soft. Bowel sounds are normal. NT. No HSM  Musculoskeletal: Normal range of motion. Exhibits no edema.  Lymphadenopathy:  Has no cervical adenopathy.  Neurological: Pt is alert and oriented to person, place, and time. Pt has normal reflexes. No cranial nerve deficit. Motor grossly intact Skin: Skin is warm and dry. No rash noted. Has left >  right mult varicosities, NT, no phlebitis Psychiatric:  Has normal mood and affect. Behavior is normal.     Assessment & Plan:

## 2015-06-30 NOTE — Assessment & Plan Note (Signed)
stable overall by history and exam, recent data reviewed with pt, and pt to continue medical treatment as before,  to f/u any worsening symptoms or concerns, no DVT hx,  to f/u any worsening symptoms or concerns, no ulcers currently

## 2015-06-30 NOTE — Progress Notes (Signed)
Pre visit review using our clinic review tool, if applicable. No additional management support is needed unless otherwise documented below in the visit note. 

## 2015-07-01 NOTE — Telephone Encounter (Signed)
(  via my chart message) Pt is requesting to change lifestyle instead of taking a statin.

## 2015-07-16 ENCOUNTER — Ambulatory Visit (INDEPENDENT_AMBULATORY_CARE_PROVIDER_SITE_OTHER): Payer: Medicare Other | Admitting: Family Medicine

## 2015-07-16 ENCOUNTER — Encounter: Payer: Self-pay | Admitting: Family Medicine

## 2015-07-16 VITALS — BP 116/76 | HR 70

## 2015-07-16 DIAGNOSIS — M19079 Primary osteoarthritis, unspecified ankle and foot: Secondary | ICD-10-CM

## 2015-07-16 DIAGNOSIS — M2010 Hallux valgus (acquired), unspecified foot: Secondary | ICD-10-CM | POA: Diagnosis not present

## 2015-07-16 DIAGNOSIS — M216X9 Other acquired deformities of unspecified foot: Secondary | ICD-10-CM

## 2015-07-16 NOTE — Assessment & Plan Note (Signed)
Discussed with patient at great length. Patient does have severe osteoarthritic changes of multiple joints of the foot. Patient does have breakdown of the longitudinal and transverse arches that are likely contributing as well. We discussed different conservative therapies including proper shoes, over-the-counter orthotics, topical anti-inflammatories and trials given. Discussed over-the-counter medications. Patient is to try these different changes and come back and see me again in 4-6 weeks for further evaluation.

## 2015-07-16 NOTE — Patient Instructions (Signed)
Good to see you  Ice is your friend if needed  For the toenail try vicks vapro rub on q tip under toenail nightly  Allegria or xerelo shoes would be best  Otherwise Good shoes with rigid bottom.  Jalene Mullet, Merrell or New balance greater then 700 Spenco orthotics "total support" online would be great  Use spacer as needed Also would consider sandals with a back on them.  Glucosamine 1500mg  daily  pennsaid pinkie amount topically 2 times daily as needed.   See me again in 6 weeks to see if we are making progress.

## 2015-07-16 NOTE — Progress Notes (Signed)
[name] [place], [place] [address] Phone: [phone] Subjective:      CC: bilateral foot pain [id number] Kim Mclaughlin is a 70 y.o. female coming in with complaint of bilateral foot pain. Patient has had this pain for multiple years. Patient does have a history of having right sided bunionectomy as well as fusion of the second and third toes. Patient states unfortunately the bunion seems to be coming back. Patient is having very similar presentation on the contralateral side and she would like to avoid any surgical intervention. Patient states that the pain though seems to be daily. Worse with walking. Has to invest in good shoes. Patient has had custom orthotics. Has to take over-the-counter anti-inflammatories as needed. Denies giving out on her, denies swelling or any numbness. States that the pain can be sore enough that it can give her pain at night. Rates the severity of pain at its worse is 8 out of 10 in severity.      Past Medical History  Diagnosis Date  . GERD (gastroesophageal reflux disease)   . Hyperlipidemia   . Hepatitis     in 10 th grade  . Osteopenia     [facility]  . Varicose veins   . DVT (deep venous thrombosis) ([place])   . Arthritis 07/2014    Right hand, prescribed prednisone  . Precancerous lesion 12/2014    Treated with cream only  . MVP (mitral valve prolapse)   . PONV (postoperative nausea and vomiting)     with ear surgery only   Past Surgical History  Procedure Laterality Date  . Appendectomy    . Tubal ligation    . Stapedectomy    . G 3 p 3    . Dilation and curettage of uterus    . Colonoscopy with polypectomy  07/2012     hyperplastic;Dr Olevia Perches  . Endovenous ablation saphenous vein w/ laser Left 01-31-2014    EVLA  left greater saphenous vein  by Curt Jews MD  . Endovenous ablation saphenous vein w/ laser Right 03-14-2014    endovenous laser ablation right greater saphenous vein  by Curt Jews MD  . Dilation and curettage of uterus N/A 09/03/2014    Procedure: DILATATION AND CURETTAGE;  Surgeon: Harle Battiest, MD;  Location: [facility];  Service: Gynecology;  Laterality: N/A;   Social History   Social History  . Marital Status: Married    Spouse Name: N/A  . Number of Children: N/A  . Years of Education: N/A   Social History Main Topics  . Smoking status: Former Smoker    Quit date: 04/27/1963  . Smokeless tobacco: Never Used     Comment: Smoked < 6 mos;< 1 pack total;quit 1965  . Alcohol Use: No  . Drug Use: No  . Sexual Activity: Not Asked   Other Topics Concern  . None   Social History Narrative   Allergies  Allergen Reactions  . Ciprofloxacin     ? reaction; no rash, fever or angioedema  . Methylphenidate Hcl   . Pravastatin Sodium     REACTION: shin pain  . Amoxicillin-Pot Clavulanate     GI symptoms; abdominal pain & nausea NOTE: able to take Ampicillin  . Augmentin [Amoxicillin-Pot Clavulanate] Nausea Only    Upsets stomach  . Codeine Nausea Only  . Sulfamethoxazole-Trimethoprim Nausea Only     nausea   Family History  Problem Relation Age of Onset  .  Pancreatic cancer Mother   . Miscarriages / [place] Mother   . Thyroid disease Mother     partial thyroidectomy ; S/P RAI  . Heart attack Father     in early 71s  . Colon cancer Father   . Colon cancer Paternal Grandmother   . Stroke Paternal Grandmother     > 65  . Heart attack Paternal Grandfather     in 55s  . Cancer      among paternal sibs (colon, breast, bone)  . Aneurysm Paternal Uncle     brain  . Ovarian cancer Maternal Aunt   . Diabetes Neg Hx   . Breast cancer Paternal Aunt     Past medical history, social, surgical and family history all reviewed in electronic medical record.  No pertanent information unless stated regarding to the chief complaint.   Review of Systems: No headache, visual changes, nausea, vomiting, diarrhea, constipation, dizziness,  abdominal pain, skin rash, fevers, chills, night sweats, weight loss, swollen lymph nodes, body aches, joint swelling, muscle aches, chest pain, shortness of breath, mood changes.   Objective Blood pressure 116/76, pulse 70.  General: No apparent distress alert and oriented x3 mood and affect normal, dressed appropriately.  HEENT: Pupils equal, extraocular movements intact  Respiratory: Patient's speak in full sentences and does not appear short of breath  Cardiovascular: No lower extremity edema, non tender, no erythema  Skin: Warm dry intact with no signs of infection or rash on extremities or on axial skeleton.  Abdomen: Soft nontender  Neuro: Cranial nerves II through XII are intact, neurovascularly intact in all extremities with 2+ DTRs and 2+ pulses.  Lymph: No lymphadenopathy of posterior or anterior cervical chain or axillae bilaterally.  Gait normal with good balance and coordination.  MSK:  Non tender with full range of motion and good stability and symmetric strength and tone of shoulders, elbows, wrist, hip, knee and ankles bilaterally. Mild arthritic changes of multiple joints  Foot exam shows the patient does have bunion and bunionette formations of the first and fifth toes bilaterally. Patient does have severe breakdown of the transverse arch bilaterally. Patient's right foot shows postsurgical changes. Patient does have fusion of the second and third toes. Neurovascularly intact distally. Patient does have mild overpronation of the hindfoot bilaterally. Patient does have significant subluxation fibular deviation of the first toe on the left foot. Mild generalized tenderness to palpation of the feet on the dorsal aspect.    Impression and Recommendations:     This case required medical decision making of moderate complexity.      Note: This dictation was prepared with Dragon dictation along with smaller phrase technology. Any transcriptional errors that result from this  process are unintentional.

## 2015-07-21 ENCOUNTER — Telehealth: Payer: Self-pay | Admitting: Internal Medicine

## 2015-07-21 NOTE — Telephone Encounter (Signed)
Pt called in and would like the results of the DEXA scan mailed to her

## 2015-07-22 NOTE — Telephone Encounter (Signed)
Pt was informed that results would be mailed out today. Pt stated understanding.

## 2015-07-22 NOTE — Telephone Encounter (Signed)
pls call pt and let her know we are emailing the results.   Envelope is addressed and should go out today.

## 2015-07-29 ENCOUNTER — Encounter: Payer: Self-pay | Admitting: Family Medicine

## 2015-07-29 ENCOUNTER — Encounter: Payer: Self-pay | Admitting: Internal Medicine

## 2015-10-09 ENCOUNTER — Telehealth: Payer: Self-pay | Admitting: Internal Medicine

## 2015-10-09 NOTE — Telephone Encounter (Signed)
Ok with me 

## 2015-10-09 NOTE — Telephone Encounter (Signed)
Pt called in stating that she and her husband use to see Dr. Etter Sjogren. They werent sure were she had gone and would like to switch back to her.    Please advise.   [phone]- I will call pt back to give confirmation on transition if so.

## 2015-10-10 ENCOUNTER — Encounter: Payer: Self-pay | Admitting: Family Medicine

## 2015-10-10 ENCOUNTER — Ambulatory Visit (INDEPENDENT_AMBULATORY_CARE_PROVIDER_SITE_OTHER): Payer: Medicare Other | Admitting: Family Medicine

## 2015-10-10 VITALS — BP 110/62 | HR 70 | Ht 63.0 in | Wt 154.0 lb

## 2015-10-10 DIAGNOSIS — M216X9 Other acquired deformities of unspecified foot: Secondary | ICD-10-CM | POA: Diagnosis not present

## 2015-10-10 DIAGNOSIS — M810 Age-related osteoporosis without current pathological fracture: Secondary | ICD-10-CM

## 2015-10-10 DIAGNOSIS — M19079 Primary osteoarthritis, unspecified ankle and foot: Secondary | ICD-10-CM

## 2015-10-10 MED ORDER — VITAMIN D (ERGOCALCIFEROL) 1.25 MG (50000 UNIT) PO CAPS: 50000.0000 [IU] | ORAL_CAPSULE | ORAL | Status: DC

## 2015-10-10 NOTE — Progress Notes (Signed)
[name] [address], [place] [address] Phone: [phone] Subjective:      CC: bilateral foot pain Follow-up [id number] Kim Mclaughlin is a 70 y.o. female coming in with complaint of bilateral foot pain. Patient was found to have moderate to severe degenerative changes of the foot especially the midfoot. Patient was seen 3 months ago. We discussed proper shoes, icing protocol, given trial topical anti-inflammatories, and we discussed over-the-counter orthotics. Patient states overall she is been doing relatively well. Having some mild increase in pain. Has been more active working in the yard. Noticing pain over the right fifth metatarsal. No swelling, no redness. Still able to do daily activities. Seems to be more intermittent. Does not matter what shoes she is wearing or what activities.      Past Medical History  Diagnosis Date  . GERD (gastroesophageal reflux disease)   . Hyperlipidemia   . Hepatitis     in 10 th grade  . Osteopenia     [facility]  . Varicose veins   . DVT (deep venous thrombosis) ([place])   . Arthritis 07/2014    Right hand, prescribed prednisone  . Precancerous lesion 12/2014    Treated with cream only  . MVP (mitral valve prolapse)   . PONV (postoperative nausea and vomiting)     with ear surgery only   Past Surgical History  Procedure Laterality Date  . Appendectomy    . Tubal ligation    . Stapedectomy    . G 3 p 3    . Dilation and curettage of uterus    . Colonoscopy with polypectomy  07/2012     hyperplastic;Dr Olevia Perches  . Endovenous ablation saphenous vein w/ laser Left 01-31-2014    EVLA  left greater saphenous vein  by Curt Jews MD  . Endovenous ablation saphenous vein w/ laser Right 03-14-2014    endovenous laser ablation right greater saphenous vein  by Curt Jews MD  . Dilation and curettage of uterus N/A 09/03/2014    Procedure: DILATATION AND CURETTAGE;  Surgeon: Harle Battiest, MD;  Location[facility];  Service: Gynecology;  Laterality: N/A;   Social History   Social History  . Marital Status: Married    Spouse Name: N/A  . Number of Children: N/A  . Years of Education: N/A   Social History Main Topics  . Smoking status: Former Smoker    Quit date: 04/27/1963  . Smokeless tobacco: Never Used     Comment: Smoked < 6 mos;< 1 pack total;quit 1965  . Alcohol Use: No  . Drug Use: No  . Sexual Activity: Not Asked   Other Topics Concern  . None   Social History Narrative   Allergies  Allergen Reactions  . Ciprofloxacin     ? reaction; no rash, fever or angioedema  . Methylphenidate Hcl   . Pravastatin Sodium     REACTION: shin pain  . Amoxicillin-Pot Clavulanate     GI symptoms; abdominal pain & nausea NOTE: able to take Ampicillin  . Augmentin [Amoxicillin-Pot Clavulanate] Nausea Only    Upsets stomach  . Codeine Nausea Only  . Sulfamethoxazole-Trimethoprim Nausea Only     nausea   Family History  Problem Relation Age of Onset  . Pancreatic cancer Mother   . Miscarriages / [place] Mother   . Thyroid disease Mother     partial thyroidectomy ; S/P RAI  . Heart attack Father     in  early 17s  . Colon cancer Father   . Colon cancer Paternal Grandmother   . Stroke Paternal Grandmother     > 65  . Heart attack Paternal Grandfather     in 63s  . Cancer      among paternal sibs (colon, breast, bone)  . Aneurysm Paternal Uncle     brain  . Ovarian cancer Maternal Aunt   . Diabetes Neg Hx   . Breast cancer Paternal Aunt     Past medical history, social, surgical and family history all reviewed in electronic medical record.  No pertanent information unless stated regarding to the chief complaint.   Review of Systems: No headache, visual changes, nausea, vomiting, diarrhea, constipation, dizziness, abdominal pain, skin rash, fevers, chills, night sweats, weight loss, swollen lymph nodes, body aches, joint swelling, muscle aches, chest pain,  shortness of breath, mood changes.   Objective Blood pressure 110/62, pulse 70, height 5\' 3"  (1.6 m), weight 154 lb (69.854 kg), SpO2 98 %.  General: No apparent distress alert and oriented x3 mood and affect normal, dressed appropriately.  HEENT: Pupils equal, extraocular movements intact  Respiratory: Patient's speak in full sentences and does not appear short of breath  Cardiovascular: No lower extremity edema, non tender, no erythema  Skin: Warm dry intact with no signs of infection or rash on extremities or on axial skeleton.  Abdomen: Soft nontender  Neuro: Cranial nerves II through XII are intact, neurovascularly intact in all extremities with 2+ DTRs and 2+ pulses.  Lymph: No lymphadenopathy of posterior or anterior cervical chain or axillae bilaterally.  Gait normal with good balance and coordination.  MSK:  Non tender with full range of motion and good stability and symmetric strength and tone of shoulders, elbows, wrist, hip, knee and ankles bilaterally. Mild arthritic changes of multiple joints  Foot exam shows the patient does have bunion and bunionette formations of the first and fifth toes bilaterally. Patient does have severe breakdown of the transverse arch bilaterally. Patient's right foot shows postsurgical changes. Patient does have fusion of the second and third toes. Neurovascularly intact distally. Patient does have mild overpronation of the hindfoot bilaterally. Patient does have significant subluxation fibular deviation of the first toe on the left foot.  Patient though has overlapping of the second toe on the left foot over the first toe. This is new from previous exam. Mild discomfort over the fifth metatarsal but very minimal. No inflammation or erythema noted.   Impression and Recommendations:     This case required medical decision making of moderate complexity.      Note: This dictation was prepared with Dragon dictation along with smaller phrase technology.  Any transcriptional errors that result from this process are unintentional.

## 2015-10-10 NOTE — Assessment & Plan Note (Signed)
Once weekly vitamin D dosing began again.

## 2015-10-10 NOTE — Patient Instructions (Signed)
Good to see you  Be careful with twisting motions on the foot when you are in the yard.  Ice after working outside or walking long distances to the side of the foot Try the pennsaid as well to help keep down the inflammation.  For the left foot Consider either taping the toe to the 3rd toe or a band aide on the top of the big toe.  I like the sandals but an adjustable stap on the toes could help as well.  Stop the daily vitamin D and start the once weekly vitamin D See me again in 6 weeks

## 2015-10-10 NOTE — Assessment & Plan Note (Signed)
Patient's arthritis of her foot seems to be stable. She is stating reactive. We are going up or on once weekly vitamin D secondary to her osteopenia. Patient did have improvement in her bone density been able to continue to improve. We discussed icing regimen. Discussed proper shoes again. We discussed taping of the toes with patient having subluxing of the second toe. Patient's pain over the fifth metatarsal likely is more secondary to the arthritis but a possible capsulitis and if worsening symptoms we'll consider injection. Follow-up again in 6 weeks.

## 2015-10-10 NOTE — Progress Notes (Signed)
Pre visit review using our clinic review tool, if applicable. No additional management support is needed unless otherwise documented below in the visit note. 

## 2015-10-17 ENCOUNTER — Encounter: Payer: Self-pay | Admitting: Family Medicine

## 2015-10-17 MED ORDER — DICLOFENAC SODIUM 2 % TD SOLN: 2.0000 "application " | Freq: Two times a day (BID) | TRANSDERMAL | Status: DC

## 2015-11-06 ENCOUNTER — Encounter: Payer: Self-pay | Admitting: Family Medicine

## 2015-11-06 ENCOUNTER — Ambulatory Visit (HOSPITAL_BASED_OUTPATIENT_CLINIC_OR_DEPARTMENT_OTHER)
Admission: RE | Admit: 2015-11-06 | Discharge: 2015-11-06 | Disposition: A | Discharge status: Home / Self Care | Payer: Medicare Other | Source: Ambulatory Visit | Attending: Family Medicine | Admitting: Family Medicine

## 2015-11-06 ENCOUNTER — Ambulatory Visit (INDEPENDENT_AMBULATORY_CARE_PROVIDER_SITE_OTHER): Payer: Medicare Other | Admitting: Family Medicine

## 2015-11-06 VITALS — BP 120/78 | HR 78 | Temp 98.1°F | Ht 63.0 in | Wt 155.4 lb

## 2015-11-06 DIAGNOSIS — M79605 Pain in left leg: Secondary | ICD-10-CM | POA: Insufficient documentation

## 2015-11-06 DIAGNOSIS — E785 Hyperlipidemia, unspecified: Secondary | ICD-10-CM | POA: Diagnosis not present

## 2015-11-06 DIAGNOSIS — E559 Vitamin D deficiency, unspecified: Secondary | ICD-10-CM

## 2015-11-06 NOTE — Progress Notes (Signed)
Pre visit review using our clinic review tool, if applicable. No additional management support is needed unless otherwise documented below in the visit note. 

## 2015-11-06 NOTE — Progress Notes (Signed)
Patient ID: Kim Mclaughlin, female    DOB: 08-10-1945  Age: 70 y.o. MRN: [id number]    Subjective:  Subjective HPI Kim Mclaughlin presents for c/o pain in L leg off and on x 6 months.  Pt is concerned about bone cancer.  She said Sun night it woke her ---- it does not bother her every night.    Review of Systems  Constitutional: Negative for diaphoresis, appetite change, fatigue and unexpected weight change.  Eyes: Negative for pain, redness and visual disturbance.  Respiratory: Negative for cough, chest tightness, shortness of breath and wheezing.   Cardiovascular: Negative for chest pain, palpitations and leg swelling.  Endocrine: Negative for cold intolerance, heat intolerance, polydipsia, polyphagia and polyuria.  Genitourinary: Negative for dysuria, frequency and difficulty urinating.  Musculoskeletal: Positive for arthralgias.  Neurological: Negative for dizziness, light-headedness, numbness and headaches.    History Past Medical History  Diagnosis Date  . GERD (gastroesophageal reflux disease)   . Hyperlipidemia   . Hepatitis     in 10 th grade  . Osteopenia     [facility]  . Varicose veins   . DVT (deep venous thrombosis) ([place])   . Arthritis 07/2014    Right hand, prescribed prednisone  . Precancerous lesion 12/2014    Treated with cream only  . MVP (mitral valve prolapse)   . PONV (postoperative nausea and vomiting)     with ear surgery only    She has past surgical history that includes Appendectomy; Tubal ligation; Stapedectomy; G 3 P 3; Dilation and curettage of uterus; colonoscopy with polypectomy (07/2012); Endovenous ablation saphenous vein w/ laser (Left, 01-31-2014); Endovenous ablation saphenous vein w/ laser (Right, 03-14-2014); and Dilation and curettage of uterus (N/A, 09/03/2014).   Her family history includes Aneurysm in her paternal uncle; Breast cancer in her paternal aunt; Colon cancer in her father and paternal grandmother; Heart attack in her father  and paternal grandfather; Miscarriages / [place] in her mother; Ovarian cancer in her maternal aunt; Pancreatic cancer in her mother; Stroke in her paternal grandmother; Thyroid disease in her mother. There is no history of Diabetes.She reports that she quit smoking about 52 years ago. She has never used smokeless tobacco. She reports that she does not drink alcohol or use illicit drugs.  Current Outpatient Prescriptions on File Prior to Visit  Medication Sig Dispense Refill  . aspirin 81 MG tablet Take 81 mg by mouth daily.     . Bepotastine Besilate 1.5 % SOLN Apply 1 drop to eye daily as needed (allergies).    . diclofenac sodium (VOLTAREN) 1 % GEL Apply 1 application topically 2 (two) times daily as needed. 1 Tube 2  . Misc Natural Products (OSTEO BI-FLEX JOINT SHIELD PO) Take 2 tablets by mouth 2 (two) times daily.    . Multiple Minerals-Vitamins (CAL-MAG-ZINC-D PO) Take 1 tablet by mouth 2 (two) times daily.     . Multiple Vitamins-Minerals (MULTIVITAMIN PO) Take 1 tablet by mouth daily.      Marland Kitchen triamcinolone (NASACORT) 55 MCG/ACT AERO nasal inhaler Place 2 sprays into the nose daily as needed (allergies).     . Vitamin D, Ergocalciferol, (DRISDOL) 50000 units CAPS capsule Take 1 capsule (50,000 Units total) by mouth every 7 (seven) days. 12 capsule 0   No current facility-administered medications on file prior to visit.     Objective:  Objective Physical Exam  Constitutional: She is oriented to person, place, and time. She appears well-developed and well-nourished.  HENT:  Head:  Normocephalic and atraumatic.  Eyes: Conjunctivae and EOM are normal.  Neck: Normal range of motion. Neck supple. No JVD present. Carotid bruit is not present. No thyromegaly present.  Cardiovascular: Normal rate, regular rhythm and normal heart sounds.   No murmur heard. Pulmonary/Chest: Effort normal and breath sounds normal. No respiratory distress. She has no wheezes. She has no rales. She exhibits no  tenderness.  Musculoskeletal: She exhibits tenderness. She exhibits no edema.       Legs: Neurological: She is alert and oriented to person, place, and time.  Psychiatric: She has a normal mood and affect. Her behavior is normal. Judgment and thought content normal.  Nursing note and vitals reviewed.  BP 120/78 mmHg  Pulse 78  Temp(Src) 98.1 F (36.7 C) (Oral)  Ht 5\' 3"  (1.6 m)  Wt 155 lb 6.4 oz (70.489 kg)  BMI 27.53 kg/m2  SpO2 98% Wt Readings from Last 3 Encounters:  11/06/15 155 lb 6.4 oz (70.489 kg)  10/10/15 154 lb (69.854 kg)  06/30/15 151 lb 8 oz (68.72 kg)     Lab Results  Component Value Date   WBC 5.8 06/30/2015   HGB 13.8 06/30/2015   HCT 40.7 06/30/2015   PLT 313.0 06/30/2015   GLUCOSE 99 06/30/2015   CHOL 239* 06/30/2015   TRIG 88.0 06/30/2015   HDL 68.80 06/30/2015   LDLDIRECT 188.0 08/08/2012   LDLCALC 153* 06/30/2015   ALT 16 06/30/2015   AST 18 06/30/2015   NA 140 06/30/2015   K 4.5 06/30/2015   CL 105 06/30/2015   CREATININE 0.90 06/30/2015   BUN 11 06/30/2015   CO2 28 06/30/2015   TSH 1.30 06/30/2015    No results found.   Assessment & Plan:  Plan I have discontinued Ms. Sackrider's calcium citrate-vitamin D, BIOTIN PO, GNP KRILL OIL OMEGA-3 PO, tolnaftate, TART CHERRY ADVANCED, loratadine, and rosuvastatin. I am also having her maintain her Multiple Vitamins-Minerals (MULTIVITAMIN PO), aspirin, Multiple Minerals-Vitamins (CAL-MAG-ZINC-D PO), triamcinolone, Misc Natural Products (OSTEO BI-FLEX JOINT SHIELD PO), Bepotastine Besilate, diclofenac sodium, Vitamin D (Ergocalciferol), Specialty Vitamins Products (ONE-A-DAY BONE STRENGTH PO), Fish Oil, cetirizine, Niacin (VITAMIN B-3 PO), and GLUCOSAMINE-CALCIUM-VIT D PO.  Meds ordered this encounter  Medications  . Specialty Vitamins Products (ONE-A-DAY BONE STRENGTH PO)    Sig: Take 2 tablets by mouth 3 (three) times daily.  . Omega-3 Fatty Acids (FISH OIL) 1000 MG CAPS    Sig: Take 1 capsule by  mouth daily.  . cetirizine (ZYRTEC) 10 MG tablet    Sig: Take 10 mg by mouth daily.  . Niacin (VITAMIN B-3 PO)    Sig: Take 1 tablet by mouth daily.  Marland Kitchen GLUCOSAMINE-CALCIUM-VIT D PO    Sig: Take 1 tablet by mouth daily.    Problem List Items Addressed This Visit    None    Visit Diagnoses    Left leg pain    -  Primary    Relevant Orders    DG Tibia/Fibula Left (Completed)    Hyperlipidemia LDL goal <100        Relevant Orders    Lipid panel    Comprehensive metabolic panel    Vitamin D deficiency        Relevant Orders    Vitamin D (25 hydroxy)     cont voltaren prn Pt will f/u vascular and or sport med if symptoms persist and xray normal   Follow-up: Return in about 6 months (around 05/08/2016), or if symptoms worsen or fail to improve, for  annual exam, fasting.  Ann Held, DO

## 2015-11-06 NOTE — Patient Instructions (Signed)

## 2015-11-07 ENCOUNTER — Other Ambulatory Visit (INDEPENDENT_AMBULATORY_CARE_PROVIDER_SITE_OTHER): Payer: Medicare Other

## 2015-11-07 DIAGNOSIS — E559 Vitamin D deficiency, unspecified: Secondary | ICD-10-CM | POA: Diagnosis not present

## 2015-11-07 DIAGNOSIS — E785 Hyperlipidemia, unspecified: Secondary | ICD-10-CM

## 2015-11-07 LAB — COMPREHENSIVE METABOLIC PANEL
ALBUMIN: 4.1 g/dL (ref 3.5–5.2)
ALT: 20 U/L (ref 0–35)
AST: 20 U/L (ref 0–37)
Alkaline Phosphatase: 75 U/L (ref 39–117)
BILIRUBIN TOTAL: 0.3 mg/dL (ref 0.2–1.2)
BUN: 18 mg/dL (ref 6–23)
CALCIUM: 9.4 mg/dL (ref 8.4–10.5)
CO2: 27 mEq/L (ref 19–32)
CREATININE: 0.94 mg/dL (ref 0.40–1.20)
Chloride: 107 mEq/L (ref 96–112)
GFR: 62.49 mL/min (ref >60.00)
Glucose, Bld: 88 mg/dL (ref 70–99)
Potassium: 4.1 mEq/L (ref 3.5–5.1)
Sodium: 141 mEq/L (ref 135–145)
TOTAL PROTEIN: 7.2 g/dL (ref 6.0–8.3)

## 2015-11-07 LAB — LIPID PANEL
CHOLESTEROL: 240 mg/dL — AB (ref 0–200)
HDL: 72.6 mg/dL (ref >39.00)
LDL Cholesterol: 153 mg/dL — ABNORMAL HIGH (ref 0–99)
NonHDL: 167.32
TRIGLYCERIDES: 74 mg/dL (ref 0.0–149.0)
Total CHOL/HDL Ratio: 3
VLDL: 14.8 mg/dL (ref 0.0–40.0)

## 2015-11-07 LAB — VITAMIN D 25 HYDROXY (VIT D DEFICIENCY, FRACTURES): VITD: 72.64 ng/mL (ref 30.00–100.00)

## 2015-11-13 ENCOUNTER — Telehealth: Payer: Self-pay | Admitting: *Deleted

## 2015-11-13 NOTE — Telephone Encounter (Signed)
Attempted to reach pt and received voicemail. Left detailed message to check mychart. Message sent.  lab results / returning your call   Received: [place], [place]    Phone Number: [phone]

## 2015-11-14 ENCOUNTER — Encounter: Payer: Self-pay | Admitting: Family Medicine

## 2015-11-17 ENCOUNTER — Encounter: Payer: Self-pay | Admitting: Family Medicine

## 2015-11-17 NOTE — Telephone Encounter (Signed)
All statins can potentially have problems but at 5 mg and only 3x a week you should not have an issue--- we can check your liver function after 6 weeks instead of waiting  Months if you like

## 2015-11-17 NOTE — Telephone Encounter (Signed)
Can use dx hyperlipidemia-----for diagnosis code.  I don't know what code she is talking about Can't really use fam hx because according to hx we have all events occurred older than 50

## 2015-11-17 NOTE — Telephone Encounter (Signed)
I'm not sure what code she is talking about ----  Dx is hyperlipidemia

## 2015-11-17 NOTE — Telephone Encounter (Signed)
Notified pt. She states she would like to hold off on cholesterol medication at this time. Will call us back to let us know when she wants to proceed.

## 2015-11-21 ENCOUNTER — Other Ambulatory Visit: Payer: Self-pay

## 2015-11-21 ENCOUNTER — Encounter: Payer: Self-pay | Admitting: Family Medicine

## 2015-11-21 ENCOUNTER — Other Ambulatory Visit: Payer: Self-pay | Admitting: Family Medicine

## 2015-11-21 DIAGNOSIS — E785 Hyperlipidemia, unspecified: Secondary | ICD-10-CM

## 2015-11-21 NOTE — Telephone Encounter (Signed)
I ordered the nmr test--- when she was talking about particle size that is the test we usually use Order is in

## 2015-11-24 ENCOUNTER — Encounter: Payer: Self-pay | Admitting: Family Medicine

## 2015-11-24 ENCOUNTER — Ambulatory Visit (INDEPENDENT_AMBULATORY_CARE_PROVIDER_SITE_OTHER): Payer: Medicare Other | Admitting: Family Medicine

## 2015-11-24 VITALS — BP 121/72 | HR 79 | Temp 97.8°F | Resp 16 | Ht 63.0 in | Wt 156.8 lb

## 2015-11-24 DIAGNOSIS — N390 Urinary tract infection, site not specified: Secondary | ICD-10-CM | POA: Diagnosis not present

## 2015-11-24 DIAGNOSIS — R82998 Other abnormal findings in urine: Secondary | ICD-10-CM

## 2015-11-24 DIAGNOSIS — R21 Rash and other nonspecific skin eruption: Secondary | ICD-10-CM | POA: Diagnosis not present

## 2015-11-24 DIAGNOSIS — R350 Frequency of micturition: Secondary | ICD-10-CM | POA: Diagnosis not present

## 2015-11-24 LAB — POC URINALSYSI DIPSTICK (AUTOMATED)
Bilirubin, UA: NEGATIVE
Glucose, UA: NEGATIVE
KETONES UA: NEGATIVE
Nitrite, UA: NEGATIVE
PROTEIN UA: NEGATIVE
SPEC GRAV UA: 1.02
Urobilinogen, UA: 4
pH, UA: 6

## 2015-11-24 MED ORDER — NITROFURANTOIN MONOHYD MACRO 100 MG PO CAPS: 100.0000 mg | ORAL_CAPSULE | Freq: Two times a day (BID) | ORAL | 0 refills | Status: DC

## 2015-11-24 MED ORDER — CLOTRIMAZOLE-BETAMETHASONE 1-0.05 % EX CREA: 1.0000 "application " | TOPICAL_CREAM | Freq: Two times a day (BID) | CUTANEOUS | 0 refills | Status: DC

## 2015-11-24 MED ORDER — NITROFURANTOIN MONOHYD MACRO 100 MG PO CAPS: 100.0000 mg | ORAL_CAPSULE | Freq: Two times a day (BID) | ORAL | Status: DC

## 2015-11-24 NOTE — Patient Instructions (Signed)

## 2015-11-24 NOTE — Progress Notes (Signed)
Patient ID: Kim Mclaughlin, female    DOB: 04-25-1946  Age: 70 y.o. MRN: [id number]    Subjective:  Subjective  HPI Kim Mclaughlin presents for rash on R forearm after working in garden with gloves.  It is very itchy.  She has used cortisone and otc anti fungal cream with very little relief.  She also used bleach with no relief.    She is also c/o dysuria, urinary frequency x last few days.   No other complaints.   Review of Systems  Constitutional: Negative for appetite change, diaphoresis, fatigue and unexpected weight change.  Eyes: Negative for pain, redness and visual disturbance.  Respiratory: Negative for cough, chest tightness, shortness of breath and wheezing.   Cardiovascular: Negative for chest pain, palpitations and leg swelling.  Endocrine: Negative for cold intolerance, heat intolerance, polydipsia, polyphagia and polyuria.  Genitourinary: Positive for dysuria and frequency. Negative for difficulty urinating.  Skin: Positive for rash. Negative for wound.  Neurological: Negative for dizziness, light-headedness, numbness and headaches.    History Past Medical History:  Diagnosis Date  . Arthritis 07/2014   Right hand, prescribed prednisone  . DVT (deep venous thrombosis) ([place])   . GERD (gastroesophageal reflux disease)   . Hepatitis    in 10 th grade  . Hyperlipidemia   . MVP (mitral valve prolapse)   . Osteopenia    [place]  . PONV (postoperative nausea and vomiting)    with ear surgery only  . Precancerous lesion 12/2014   Treated with cream only  . Varicose veins     She has a past surgical history that includes Appendectomy; Tubal ligation; Stapedectomy; G 3 P 3; Dilation and curettage of uterus; colonoscopy with polypectomy (07/2012); Endovenous ablation saphenous vein w/ laser (Left, 01-31-2014); Endovenous ablation saphenous vein w/ laser (Right, 03-14-2014); and Dilation and curettage of uterus (N/A, 09/03/2014).   Her family history includes Aneurysm in  her paternal uncle; Breast cancer in her paternal aunt; Colon cancer in her father and paternal grandmother; Heart attack in her father and paternal grandfather; Miscarriages / [place] in her mother; Ovarian cancer in her maternal aunt; Pancreatic cancer in her mother; Stroke in her paternal grandmother; Thyroid disease in her mother.She reports that she quit smoking about 52 years ago. She has never used smokeless tobacco. She reports that she does not drink alcohol or use drugs.  Current Outpatient Prescriptions on File Prior to Visit  Medication Sig Dispense Refill  . aspirin 81 MG tablet Take 81 mg by mouth daily.     . Bepotastine Besilate 1.5 % SOLN Apply 1 drop to eye daily as needed (allergies).    . cetirizine (ZYRTEC) 10 MG tablet Take 10 mg by mouth daily.    . diclofenac sodium (VOLTAREN) 1 % GEL Apply 1 application topically 2 (two) times daily as needed. 1 Tube 2  . Multiple Vitamins-Minerals (MULTIVITAMIN PO) Take 1 tablet by mouth daily.      . Omega-3 Fatty Acids (FISH OIL) 1000 MG CAPS Take 1 capsule by mouth daily.    Marland Kitchen triamcinolone (NASACORT) 55 MCG/ACT AERO nasal inhaler Place 2 sprays into the nose daily as needed (allergies).     . Vitamin D, Ergocalciferol, (DRISDOL) 50000 units CAPS capsule Take 1 capsule (50,000 Units total) by mouth every 7 (seven) days. 12 capsule 0   No current facility-administered medications on file prior to visit.      Objective:  Objective  Physical Exam  Constitutional: She is oriented to person,  place, and time. She appears well-developed and well-nourished.  HENT:  Head: Normocephalic and atraumatic.  Eyes: Conjunctivae and EOM are normal.  Neck: Normal range of motion. Neck supple. No JVD present. Carotid bruit is not present. No thyromegaly present.  Cardiovascular: Normal rate, regular rhythm and normal heart sounds.   No murmur heard. Pulmonary/Chest: Effort normal and breath sounds normal. No respiratory distress. She has no  wheezes. She has no rales. She exhibits no tenderness.  Musculoskeletal: She exhibits no edema.  Neurological: She is alert and oriented to person, place, and time.  Skin: Rash noted. There is erythema.     Psychiatric: She has a normal mood and affect.  Nursing note and vitals reviewed.  BP 121/72 (BP Location: Left Arm, Patient Position: Sitting)   Pulse 79   Temp 97.8 F (36.6 C) (Oral)   Resp 16   Ht 5\' 3"  (1.6 m)   Wt 156 lb 12.8 oz (71.1 kg)   SpO2 97%   BMI 27.78 kg/m  Wt Readings from Last 3 Encounters:  11/24/15 156 lb 12.8 oz (71.1 kg)  11/06/15 155 lb 6.4 oz (70.5 kg)  10/10/15 154 lb (69.9 kg)     Lab Results  Component Value Date   WBC 5.8 06/30/2015   HGB 13.8 06/30/2015   HCT 40.7 06/30/2015   PLT 313.0 06/30/2015   GLUCOSE 88 11/07/2015   CHOL 240 (H) 11/07/2015   TRIG 74.0 11/07/2015   HDL 72.60 11/07/2015   LDLDIRECT 188.0 08/08/2012   LDLCALC 153 (H) 11/07/2015   ALT 20 11/07/2015   AST 20 11/07/2015   NA 141 11/07/2015   K 4.1 11/07/2015   CL 107 11/07/2015   CREATININE 0.94 11/07/2015   BUN 18 11/07/2015   CO2 27 11/07/2015   TSH 1.30 06/30/2015    Dg Tibia/fibula Left  Result Date: 11/06/2015 CLINICAL DATA:  Six months of anterior leg pain without known injury EXAM: LEFT TIBIA AND FIBULA - 2 VIEW COMPARISON:  None in PACs FINDINGS: The bones are adequately mineralized. There is no lytic or blastic lesion. There is no periosteal reaction. No for lap fracture lines are observed. There degenerative changes of the knee. The ankle is normal where visualized. The soft tissues of the leg are unremarkable. IMPRESSION: No acute or significant chronic bony abnormality of the left tibia or fibula. Electronically Signed   By: David  Martinique M.D.   On: 11/06/2015 10:57     Assessment & Plan:  Plan  I have discontinued Ms. Bucy's Multiple Minerals-Vitamins (CAL-MAG-ZINC-D PO), Misc Natural Products (OSTEO BI-FLEX JOINT SHIELD PO), Specialty  Vitamins Products (ONE-A-DAY BONE STRENGTH PO), Niacin (VITAMIN B-3 PO), and GLUCOSAMINE-CALCIUM-VIT D PO. I am also having her start on clotrimazole-betamethasone and nitrofurantoin (macrocrystal-monohydrate). Additionally, I am having her maintain her Multiple Vitamins-Minerals (MULTIVITAMIN PO), aspirin, triamcinolone, Bepotastine Besilate, diclofenac sodium, Vitamin D (Ergocalciferol), Fish Oil, cetirizine, Glucosamine HCl-MSM (MSM GLUCOSAMINE PO), Multiple Vitamins-Minerals (WOMENS BONE HEALTH PO), and b complex vitamins. We will continue to administer nitrofurantoin (macrocrystal-monohydrate).  Meds ordered this encounter  Medications  . Glucosamine HCl-MSM (MSM GLUCOSAMINE PO)    Sig: Take by mouth.  . Multiple Vitamins-Minerals (WOMENS BONE HEALTH PO)    Sig: Take 2 tablets by mouth 3 (three) times daily.  Marland Kitchen b complex vitamins capsule    Sig: Take 1 capsule by mouth daily.  . clotrimazole-betamethasone (LOTRISONE) cream    Sig: Apply 1 application topically 2 (two) times daily.    Dispense:  30 g  Refill:  0  . nitrofurantoin (macrocrystal-monohydrate) (MACROBID) capsule 100 mg  . nitrofurantoin, macrocrystal-monohydrate, (MACROBID) 100 MG capsule    Sig: Take 1 capsule (100 mg total) by mouth 2 (two) times daily.    Dispense:  14 capsule    Refill:  0    Problem List Items Addressed This Visit    None    Visit Diagnoses    Urine frequency    -  Primary   Relevant Medications   nitrofurantoin (macrocrystal-monohydrate) (MACROBID) capsule 100 mg   Other Relevant Orders   POCT Urinalysis Dipstick (Automated) (Completed)   Leukocytes in urine       Relevant Medications   nitrofurantoin (macrocrystal-monohydrate) (MACROBID) capsule 100 mg   Other Relevant Orders   Urine Culture   Rash and nonspecific skin eruption       Relevant Medications   clotrimazole-betamethasone (LOTRISONE) cream      Follow-up: Return if symptoms worsen or fail to improve.  Ann Held, DO

## 2015-11-24 NOTE — Progress Notes (Signed)
Pre visit review using our clinic review tool, if applicable. No additional management support is needed unless otherwise documented below in the visit note. 

## 2015-11-25 ENCOUNTER — Other Ambulatory Visit (INDEPENDENT_AMBULATORY_CARE_PROVIDER_SITE_OTHER): Payer: Medicare Other

## 2015-11-25 DIAGNOSIS — E785 Hyperlipidemia, unspecified: Secondary | ICD-10-CM

## 2015-11-25 LAB — COMPREHENSIVE METABOLIC PANEL
ALBUMIN: 4.1 g/dL (ref 3.5–5.2)
ALK PHOS: 79 U/L (ref 39–117)
ALT: 20 U/L (ref 0–35)
AST: 18 U/L (ref 0–37)
BILIRUBIN TOTAL: 0.5 mg/dL (ref 0.2–1.2)
BUN: 16 mg/dL (ref 6–23)
CALCIUM: 10.1 mg/dL (ref 8.4–10.5)
CHLORIDE: 106 meq/L (ref 96–112)
CO2: 27 mEq/L (ref 19–32)
CREATININE: 1 mg/dL (ref 0.40–1.20)
GFR: 58.17 mL/min — ABNORMAL LOW (ref >60.00)
Glucose, Bld: 91 mg/dL (ref 70–99)
Potassium: 4.1 mEq/L (ref 3.5–5.1)
Sodium: 141 mEq/L (ref 135–145)
TOTAL PROTEIN: 7.5 g/dL (ref 6.0–8.3)

## 2015-11-25 LAB — LIPID PANEL
CHOLESTEROL: 251 mg/dL — AB (ref 0–200)
HDL: 76.4 mg/dL (ref >39.00)
LDL CALC: 160 mg/dL — AB (ref 0–99)
NonHDL: 174.44
TRIGLYCERIDES: 70 mg/dL (ref 0.0–149.0)
Total CHOL/HDL Ratio: 3
VLDL: 14 mg/dL (ref 0.0–40.0)

## 2015-11-26 ENCOUNTER — Encounter: Payer: Self-pay | Admitting: Family Medicine

## 2015-11-26 LAB — URINE CULTURE: Colony Count: >=100000

## 2015-11-27 ENCOUNTER — Emergency Department (HOSPITAL_BASED_OUTPATIENT_CLINIC_OR_DEPARTMENT_OTHER)
Admission: EM | Admit: 2015-11-27 | Discharge: 2015-11-27 | Disposition: A | Discharge status: Home / Self Care | Payer: Medicare Other | Attending: Emergency Medicine | Admitting: Emergency Medicine

## 2015-11-27 ENCOUNTER — Telehealth: Payer: Self-pay | Admitting: Internal Medicine

## 2015-11-27 ENCOUNTER — Emergency Department (HOSPITAL_BASED_OUTPATIENT_CLINIC_OR_DEPARTMENT_OTHER): Payer: Medicare Other

## 2015-11-27 ENCOUNTER — Encounter (HOSPITAL_BASED_OUTPATIENT_CLINIC_OR_DEPARTMENT_OTHER): Payer: Self-pay

## 2015-11-27 DIAGNOSIS — Z7982 Long term (current) use of aspirin: Secondary | ICD-10-CM | POA: Insufficient documentation

## 2015-11-27 DIAGNOSIS — Z87891 Personal history of nicotine dependence: Secondary | ICD-10-CM | POA: Diagnosis not present

## 2015-11-27 DIAGNOSIS — N390 Urinary tract infection, site not specified: Secondary | ICD-10-CM | POA: Diagnosis not present

## 2015-11-27 DIAGNOSIS — R509 Fever, unspecified: Secondary | ICD-10-CM | POA: Diagnosis present

## 2015-11-27 LAB — CBC WITH DIFFERENTIAL/PLATELET
Basophils Absolute: 0 10*3/uL (ref 0.0–0.1)
Basophils Relative: 0 %
EOS PCT: 1 %
Eosinophils Absolute: 0.2 10*3/uL (ref 0.0–0.7)
HCT: 39.5 % (ref 36.0–46.0)
Hemoglobin: 13.6 g/dL (ref 12.0–15.0)
LYMPHS ABS: 1 10*3/uL (ref 0.7–4.0)
LYMPHS PCT: 8 %
MCH: 31.2 pg (ref 26.0–34.0)
MCHC: 34.4 g/dL (ref 30.0–36.0)
MCV: 90.6 fL (ref 78.0–100.0)
MONOS PCT: 5 %
Monocytes Absolute: 0.6 10*3/uL (ref 0.1–1.0)
Neutro Abs: 10.6 10*3/uL — ABNORMAL HIGH (ref 1.7–7.7)
Neutrophils Relative %: 86 %
PLATELETS: 287 10*3/uL (ref 150–400)
RBC: 4.36 MIL/uL (ref 3.87–5.11)
RDW: 12.8 % (ref 11.5–15.5)
WBC: 12.4 10*3/uL — AB (ref 4.0–10.5)

## 2015-11-27 LAB — URINALYSIS, ROUTINE W REFLEX MICROSCOPIC
Bilirubin Urine: NEGATIVE
GLUCOSE, UA: NEGATIVE mg/dL
KETONES UR: 15 mg/dL — AB
Nitrite: NEGATIVE
PROTEIN: NEGATIVE mg/dL
Specific Gravity, Urine: 1.008 (ref 1.005–1.030)
pH: 6 (ref 5.0–8.0)

## 2015-11-27 LAB — URINE MICROSCOPIC-ADD ON

## 2015-11-27 LAB — COMPREHENSIVE METABOLIC PANEL
ALBUMIN: 3.4 g/dL — AB (ref 3.5–5.0)
ALK PHOS: 62 U/L (ref 38–126)
ALT: 14 U/L (ref 14–54)
ANION GAP: 8 (ref 5–15)
AST: 16 U/L (ref 15–41)
BILIRUBIN TOTAL: 0.8 mg/dL (ref 0.3–1.2)
BUN: 10 mg/dL (ref 6–20)
CALCIUM: 7.7 mg/dL — AB (ref 8.9–10.3)
CO2: 21 mmol/L — ABNORMAL LOW (ref 22–32)
CREATININE: 0.7 mg/dL (ref 0.44–1.00)
Chloride: 108 mmol/L (ref 101–111)
GFR calc Af Amer: >60 mL/min (ref >60)
GFR calc non Af Amer: >60 mL/min (ref >60)
GLUCOSE: 120 mg/dL — AB (ref 65–99)
Potassium: 3.4 mmol/L — ABNORMAL LOW (ref 3.5–5.1)
Sodium: 137 mmol/L (ref 135–145)
TOTAL PROTEIN: 6.2 g/dL — AB (ref 6.5–8.1)

## 2015-11-27 LAB — I-STAT CG4 LACTIC ACID, ED: Lactic Acid, Venous: 1.18 mmol/L (ref 0.5–1.9)

## 2015-11-27 MED ORDER — SODIUM CHLORIDE 0.9 % IV BOLUS (SEPSIS)
1000.0000 mL | Freq: Once | INTRAVENOUS | Status: AC
Start: 2015-11-27 — End: 2015-11-27
  Administered 2015-11-27: 1000 mL via INTRAVENOUS

## 2015-11-27 MED ORDER — CEPHALEXIN 500 MG PO CAPS: 500.0000 mg | ORAL_CAPSULE | Freq: Three times a day (TID) | ORAL | 0 refills | Status: DC

## 2015-11-27 MED ORDER — SODIUM CHLORIDE 0.9 % IV BOLUS (SEPSIS)
1000.0000 mL | Freq: Once | INTRAVENOUS | Status: AC
  Administered 2015-11-27: 1000 mL via INTRAVENOUS

## 2015-11-27 MED ORDER — DEXTROSE 5 % IV SOLN
1.0000 g | Freq: Once | INTRAVENOUS | Status: AC
  Administered 2015-11-27: 1 g via INTRAVENOUS
  Filled 2015-11-27: qty 10

## 2015-11-27 MED ORDER — IBUPROFEN 100 MG/5ML PO SUSP: 10.0000 mg/kg | Freq: Once | ORAL | Status: DC

## 2015-11-27 MED ORDER — SODIUM CHLORIDE 0.9 % IV BOLUS (SEPSIS)
250.0000 mL | Freq: Once | INTRAVENOUS | Status: AC
  Administered 2015-11-27: 250 mL via INTRAVENOUS

## 2015-11-27 NOTE — Telephone Encounter (Signed)
Relation to [id number] Call back number: [phone]  Pharmacy:  Reason for call:  Patient states nitrofurantoin (macrocrystal-monohydrate) (MACROBID) capsule 100 mg is causing her to have chills, fever a little over 101.00 axillary. Transferred patient to team health. Please advise

## 2015-11-27 NOTE — ED Triage Notes (Signed)
C/o fever chills HA x today-started on macrobid yesterday for UTI-2 doses-was advised by PCP staff to come to ED for possible "allergic reaction"-NAD-steady gait

## 2015-11-27 NOTE — ED Provider Notes (Signed)
[facility] DEPT [facility] Provider Note   CSN: [id number] Arrival date & time: 11/27/15  1905  First Provider Contact:  First MD Initiated Contact with Patient 11/27/15 1920     By signing my name below, I, Kim Mclaughlin. Kim Mclaughlin, attest that this documentation has been prepared under the direction and in the presence of Kim Grosser, MD.  Electronically Signed: Maud Mclaughlin. Kim Mclaughlin, ED Scribe. 11/27/15. 7:30 PM.    History   Chief Complaint Chief Complaint  Patient presents with  . Fever    HPI Kim Mclaughlin is a 70 y.o. female.  The history is provided by the patient. No language interpreter was used.    HPI Comments: Kim Mclaughlin is a 70 y.o. female with a PMHx of DVT, mitral valve prolapse, and hyperlipidemia who presents to the Emergency Department complaining of constant, unchanged chills and fever x 1 day which started after taking a new medication. No other associated symptoms reported. Pt is currently taking Macrobid for a urinary tract infection after noting lower abdominal pain prior to PCP evaluation. First dose of Macrobid taken yesterday afternoon and second dose this morning. Pt states "i think maybe this is an allergic reaction to the medication". No recent nausea, vomiting, or abdominal pain.  PCP: Kim Cower, MD    Past Medical History:  Diagnosis Date  . Arthritis 07/2014   Right hand, prescribed prednisone  . DVT (deep venous thrombosis) ([place])   . GERD (gastroesophageal reflux disease)   . Hepatitis    in 10 th grade  . Hyperlipidemia   . MVP (mitral valve prolapse)   . Osteopenia    Kim Mclaughlin  . PONV (postoperative nausea and vomiting)    with ear surgery only  . Precancerous lesion 12/2014   Treated with cream only  . Varicose veins     Patient Active Problem List   Diagnosis Date Noted  . Loss of transverse plantar arch 10/10/2015  . Arthritis of foot, degenerative 07/16/2015  . Wellness examination 06/30/2015  . Varicose veins of lower extremities  with complications [id number]  . Varicose veins of lower extremities with other complications [id number]  . Chronic venous insufficiency 10/05/2013  . Hyperplastic colonic polyp 08/15/2013  . Unspecified adverse effect of unspecified drug, medicinal and biological substance 07/05/2011  . ARTHRALGIA 10/13/2009  . HEMIANOPSIA, HOMONYMOUS, LEFT 10/24/2008  . VARICOSE VEINS, LOWER EXTREMITIES 10/24/2008  . Osteoporosis 02/29/2008  . Hyperlipidemia 07/25/2007  . MITRAL VALVE PROLAPSE 05/09/2007  . ABNORMAL HEART RHYTHMS 05/09/2007  . ADD 02/28/2007    Past Surgical History:  Procedure Laterality Date  . APPENDECTOMY    . colonoscopy with polypectomy  07/2012    hyperplastic;Dr Olevia Perches  . DILATION AND CURETTAGE OF UTERUS    . DILATION AND CURETTAGE OF UTERUS N/A 09/03/2014   Procedure: DILATATION AND CURETTAGE;  Surgeon: Harle Battiest, MD;  Location: [facility];  Service: Gynecology;  Laterality: N/A;  . ENDOVENOUS ABLATION SAPHENOUS VEIN W/ LASER Left 01-31-2014   EVLA  left greater saphenous vein  by Curt Jews MD  . ENDOVENOUS ABLATION SAPHENOUS VEIN W/ LASER Right 03-14-2014   endovenous laser ablation right greater saphenous vein  by Curt Jews MD  . G 3 P 3    . STAPEDECTOMY    . TUBAL LIGATION      OB History    No data available       Home Medications    Prior to Admission medications   Medication Sig Start Date End Date  Taking? Authorizing Provider  aspirin 81 MG tablet Take 81 mg by mouth daily.     Historical Provider, MD  b complex vitamins capsule Take 1 capsule by mouth daily.    Historical Provider, MD  Bepotastine Besilate 1.5 % SOLN Apply 1 drop to eye daily as needed (allergies).    Historical Provider, MD  cetirizine (ZYRTEC) 10 MG tablet Take 10 mg by mouth daily.    Historical Provider, MD  clotrimazole-betamethasone (LOTRISONE) cream Apply 1 application topically 2 (two) times daily. 11/24/15   Kim Chessman Chase, DO  diclofenac sodium  (VOLTAREN) 1 % GEL Apply 1 application topically 2 (two) times daily as needed. 01/31/15   Hendricks Limes, MD  Glucosamine HCl-MSM (MSM GLUCOSAMINE PO) Take by mouth.    Historical Provider, MD  Multiple Vitamins-Minerals (MULTIVITAMIN PO) Take 1 tablet by mouth daily.      Historical Provider, MD  Multiple Vitamins-Minerals (WOMENS BONE HEALTH PO) Take 2 tablets by mouth 3 (three) times daily.    Historical Provider, MD  nitrofurantoin, macrocrystal-monohydrate, (MACROBID) 100 MG capsule Take 1 capsule (100 mg total) by mouth 2 (two) times daily. 11/24/15   Kim Chessman Chase, DO  Omega-3 Fatty Acids (FISH OIL) 1000 MG CAPS Take 1 capsule by mouth daily.    Historical Provider, MD  triamcinolone (NASACORT) 55 MCG/ACT AERO nasal inhaler Place 2 sprays into the nose daily as needed (allergies).     Historical Provider, MD  Vitamin D, Ergocalciferol, (DRISDOL) 50000 units CAPS capsule Take 1 capsule (50,000 Units total) by mouth every 7 (seven) days. 10/10/15   Kim Pulley, DO    Family History Family History  Problem Relation Age of Onset  . Pancreatic cancer Mother   . Miscarriages / [place] Mother   . Thyroid disease Mother     partial thyroidectomy ; S/P RAI  . Heart attack Father     in early 16s  . Colon cancer Father   . Colon cancer Paternal Grandmother   . Stroke Paternal Grandmother     > 65  . Heart attack Paternal Grandfather     in 85s  . Cancer      among paternal sibs (colon, breast, bone)  . Aneurysm Paternal Uncle     brain  . Ovarian cancer Maternal Aunt   . Breast cancer Paternal Aunt   . Diabetes Neg Hx     Social History Social History  Substance Use Topics  . Smoking status: Former Smoker    Quit date: 04/27/1963  . Smokeless tobacco: Never Used     Comment: Smoked < 6 mos;< 1 pack total;quit 1965  . Alcohol use No     Allergies   Ciprofloxacin; Methylphenidate hcl; Pravastatin sodium; Amoxicillin-pot clavulanate; Augmentin [amoxicillin-pot  clavulanate]; Codeine; and Sulfamethoxazole-trimethoprim   Review of Systems Review of Systems  Constitutional: Positive for chills and fever.  Respiratory: Negative for cough.   Gastrointestinal: Negative for nausea and vomiting.  Neurological: Negative for numbness.  Psychiatric/Behavioral: Negative for confusion.  All other systems reviewed and are negative.    Physical Exam Updated Vital Signs BP 156/68 (BP Location: Right Arm)   Pulse 90   Temp 98.9 F (37.2 C) (Oral)   Resp 18   Ht 5\' 3"  (1.6 m)   Wt 156 lb (70.8 kg)   SpO2 99%   BMI 27.63 kg/m   Physical Exam  Constitutional: She is oriented to person, place, and time. She appears well-developed and well-nourished.  HENT:  Head: Normocephalic.  Eyes: EOM are normal.  Neck: Normal range of motion.  Cardiovascular: Normal rate, regular rhythm and normal heart sounds.   Pulmonary/Chest: Effort normal and breath sounds normal.  Abdominal: Soft. She exhibits no distension. There is no tenderness. There is no guarding.  Musculoskeletal: Normal range of motion.  Neurological: She is alert and oriented to person, place, and time.  Psychiatric: She has a normal mood and affect.  Nursing note and vitals reviewed.    ED Treatments / Results   DIAGNOSTIC STUDIES: Oxygen Saturation is 99% on RA, Normal by my interpretation.    COORDINATION OF CARE: 7:29 PM- Will order blood work, urinalysis, and EKG. Will give fluids and Rocephin. Discussed treatment plan with pt at bedside and pt agreed to plan.      Labs (all labs ordered are listed, but only abnormal results are displayed) Labs Reviewed  CBC WITH DIFFERENTIAL/PLATELET - Abnormal; Notable for the following:       Result Value   WBC 12.4 (*)    Neutro Abs 10.6 (*)    All other components within normal limits  URINALYSIS, ROUTINE W REFLEX MICROSCOPIC (NOT AT [facility]) - Abnormal; Notable for the following:    Hgb urine dipstick MODERATE (*)    Ketones, ur 15 (*)      Leukocytes, UA TRACE (*)    All other components within normal limits  URINE MICROSCOPIC-ADD ON - Abnormal; Notable for the following:    Squamous Epithelial / LPF 0-5 (*)    Bacteria, UA RARE (*)    All other components within normal limits  COMPREHENSIVE METABOLIC PANEL - Abnormal; Notable for the following:    Potassium 3.4 (*)    CO2 21 (*)    Glucose, Bld 120 (*)    Calcium 7.7 (*)    Total Protein 6.2 (*)    Albumin 3.4 (*)    All other components within normal limits  CULTURE, BLOOD (ROUTINE X 2)  CULTURE, BLOOD (ROUTINE X 2)  URINE CULTURE  I-STAT CG4 LACTIC ACID, ED  I-STAT CG4 LACTIC ACID, ED    EKG  EKG Interpretation  Date/Time:  Thursday November 27 2015 19:33:44 EDT Ventricular Rate:  89 PR Interval:    QRS Duration: 102 QT Interval:  360 QTC Calculation: 438 R Axis:   70 Text Interpretation:  Sinus rhythm No significant change since last tracing Confirmed by Myldred Raju MD, Anetha Slagel [phone]) on 11/27/2015 7:43:09 PM       Radiology Dg Chest 2 View  Result Date: 11/27/2015 CLINICAL DATA:  Fever, chills.  Possible allergic reaction. EXAM: CHEST  2 VIEW COMPARISON:  05/06/2011 FINDINGS: Cardiomediastinal silhouette is normal. Mediastinal contours appear intact. There is no evidence of focal airspace consolidation, pleural effusion or pneumothorax. Osseous structures are without acute abnormality. Soft tissues are grossly normal. IMPRESSION: No active cardiopulmonary disease. Electronically Signed   By: Fidela Salisbury M.D.   On: 11/27/2015 20:33    Procedures Procedures (including critical care time)  Medications Ordered in ED Medications  sodium chloride 0.9 % bolus 1,000 mL (0 mLs Intravenous Stopped 11/27/15 2037)    And  sodium chloride 0.9 % bolus 1,000 mL (0 mLs Intravenous Stopped 11/27/15 2105)    And  sodium chloride 0.9 % bolus 250 mL (0 mLs Intravenous Stopped 11/27/15 2105)  cefTRIAXone (ROCEPHIN) 1 g in dextrose 5 % 50 mL IVPB (0 g Intravenous Stopped  11/27/15 2028)     Initial Impression / Assessment and Plan / ED Course  I have reviewed the triage vital signs and the nursing notes.  Pertinent labs & imaging results that were available during my care of the patient were reviewed by me and considered in my medical decision making (see chart for details).  Clinical Course    70 y.o. female presents with fever in setting of recent treatment with macrobid for 2 days for UTI. Suspect this is not an optimal agent given Pt's age. Has some SIRS so screened for sepsis. Fluid resuscitated, cultures drawn, IV ceftriaxone.   After resuscitation HR improves, Pt not septic appearing. WBC is mildly elevated but otherwise well appearing and amenable to OP treatment with cephalosporin pending culture. Return precautions discussed for worsening or new concerning symptoms.   Final Clinical Impressions(s) / ED Diagnoses   Final diagnoses:  UTI (lower urinary tract infection)  . I personally performed the services described in this documentation, which was scribed in my presence. The recorded information has been reviewed and is accurate.      New Prescriptions Discharge Medication List as of 11/27/2015  9:40 PM    START taking these medications   Details  cephALEXin (KEFLEX) 500 MG capsule Take 1 capsule (500 mg total) by mouth 3 (three) times daily., Starting Thu 11/27/2015, Print         Kim Grosser, MD 11/28/15 [phone]

## 2015-11-27 NOTE — ED Notes (Signed)
Pt alert sitting up in bed only c/o fever, chills, nausea that started at home and now has a HA

## 2015-11-28 ENCOUNTER — Telehealth: Payer: Self-pay | Admitting: Family Medicine

## 2015-11-28 NOTE — Telephone Encounter (Signed)
I've worked in a few already today We can call if we have a cancellation.  Wish she called 2 days ago Is there someone else she can here?  --- I spoke to [place] about it already

## 2015-11-28 NOTE — Telephone Encounter (Signed)
Pt is scheduled to see Dr. Lorelei Pont at [facility] for UTI symptoms. Informed pt that she will still need to F/U with PCP for ED follow up.

## 2015-11-28 NOTE — Telephone Encounter (Signed)
Please review and advise-- there is a duplicate message.     KP

## 2015-11-28 NOTE — Telephone Encounter (Signed)
Pt called in to schedule an appt with PCP. Pt says that she was seen in the ED for UTI. She says that she has already seen provider for concern and was prescribed a medication. Pt says that she is still having concern. Pt says that ED advised her to follow up with provider in 2 days. Pt says that was 2 days ago. Pt says that hospital is concerned that UTI may be getting worse because she had a fever of almost 102. Informed pt that pcp doesn't have opening today. She request a message be sent  [place]     [place]: [id number]

## 2015-11-28 NOTE — Telephone Encounter (Signed)
Please review and advise     KP 

## 2015-11-28 NOTE — Telephone Encounter (Signed)
Pt seen in ED yesterday. Atbx changed to Keflex. See notes.

## 2015-11-28 NOTE — Telephone Encounter (Signed)
Pt being seen in sat clinic

## 2015-11-29 ENCOUNTER — Ambulatory Visit (INDEPENDENT_AMBULATORY_CARE_PROVIDER_SITE_OTHER): Payer: Medicare Other | Admitting: Family Medicine

## 2015-11-29 ENCOUNTER — Encounter: Payer: Self-pay | Admitting: Family Medicine

## 2015-11-29 VITALS — BP 126/80 | HR 73 | Temp 98.0°F | Wt 156.5 lb

## 2015-11-29 DIAGNOSIS — N3000 Acute cystitis without hematuria: Secondary | ICD-10-CM

## 2015-11-29 LAB — URINE CULTURE

## 2015-11-29 NOTE — Progress Notes (Signed)
[facility] [address], [place], [place] [address] [phone]  Date:  11/29/2015   Name:  Kim Mclaughlin   DOB:  1945-07-26   MRN:  [id number]  PCP:  Ann Held, DO    Chief Complaint: Dysuria (went to ER Fri---pt reports she saw Dr Etter Sjogren Monday and UA was positive---was prescribed Macrobid Wed and Thurs morning---went to ER has been taking Keflex)   History of Present Illness:  Kim Mclaughlin is a 70 y.o. very pleasant female patient who presents with the following:  Seen by her PCP on 7/31 with urinary frequency, started on macrobid.  Was not getting better so she went to the ER due to fever of 102 on 8/3- abx changed to keflex. Pan sensitive e coli UTI from culture on 7/31, culture from 8/3 (ER visit) showed multiple organisms.    She felt better yesterday and fever is now resolved Her urinary pain is much better- she had some blood in her urine but this is resolved She is eating ok, but is still a bit nauseated.   No belly pain, no back pain.    States that she used to get UTI in the past but never had a complication.      She also has history of vertigo and had noted an episode earlier this week.  This is now improved and only occurs when she goes from laying to standing up/ sitting up.  Ok when her head is still No headache  Patient Active Problem List   Diagnosis Date Noted  . Loss of transverse plantar arch 10/10/2015  . Arthritis of foot, degenerative 07/16/2015  . Wellness examination 06/30/2015  . Varicose veins of lower extremities with complications [id number]  . Varicose veins of lower extremities with other complications [id number]  . Chronic venous insufficiency 10/05/2013  . Hyperplastic colonic polyp 08/15/2013  . Unspecified adverse effect of unspecified drug, medicinal and biological substance 07/05/2011  . ARTHRALGIA 10/13/2009  . HEMIANOPSIA, HOMONYMOUS, LEFT 10/24/2008  .  VARICOSE VEINS, LOWER EXTREMITIES 10/24/2008  . Osteoporosis 02/29/2008  . Hyperlipidemia 07/25/2007  . MITRAL VALVE PROLAPSE 05/09/2007  . ABNORMAL HEART RHYTHMS 05/09/2007  . ADD 02/28/2007    Past Medical History:  Diagnosis Date  . Arthritis 07/2014   Right hand, prescribed prednisone  . DVT (deep venous thrombosis) ([place])   . GERD (gastroesophageal reflux disease)   . Hepatitis    in 10 th grade  . Hyperlipidemia   . MVP (mitral valve prolapse)   . Osteopenia    [facility]  . PONV (postoperative nausea and vomiting)    with ear surgery only  . Precancerous lesion 12/2014   Treated with cream only  . Varicose veins     Past Surgical History:  Procedure Laterality Date  . APPENDECTOMY    . colonoscopy with polypectomy  07/2012    hyperplastic;Dr Olevia Perches  . DILATION AND CURETTAGE OF UTERUS    . DILATION AND CURETTAGE OF UTERUS N/A 09/03/2014   Procedure: DILATATION AND CURETTAGE;  Surgeon: Harle Battiest, MD;  Location: [facility];  Service: Gynecology;  Laterality: N/A;  . ENDOVENOUS ABLATION SAPHENOUS VEIN W/ LASER Left 01-31-2014   EVLA  left greater saphenous vein  by Curt Jews MD  . ENDOVENOUS ABLATION SAPHENOUS VEIN W/ LASER Right 03-14-2014   endovenous laser ablation right greater saphenous vein  by Curt Jews MD  . G 3 P 3    .  STAPEDECTOMY    . TUBAL LIGATION      Social History  Substance Use Topics  . Smoking status: Former Smoker    Quit date: 04/27/1963  . Smokeless tobacco: Never Used     Comment: Smoked < 6 mos;< 1 pack total;quit 1965  . Alcohol use No    Family History  Problem Relation Age of Onset  . Pancreatic cancer Mother   . Miscarriages / [place] Mother   . Thyroid disease Mother     partial thyroidectomy ; S/P RAI  . Heart attack Father     in early 66s  . Colon cancer Father   . Colon cancer Paternal Grandmother   . Stroke Paternal Grandmother     > 65  . Heart attack Paternal Grandfather     in 77s  . Cancer       among paternal sibs (colon, breast, bone)  . Aneurysm Paternal Uncle     brain  . Ovarian cancer Maternal Aunt   . Breast cancer Paternal Aunt   . Diabetes Neg Hx     Allergies  Allergen Reactions  . Ciprofloxacin     ? reaction; no rash, fever or angioedema  . Methylphenidate Hcl   . Pravastatin Sodium     REACTION: shin pain  . Amoxicillin-Pot Clavulanate     GI symptoms; abdominal pain & nausea NOTE: able to take Ampicillin  . Augmentin [Amoxicillin-Pot Clavulanate] Nausea Only    Upsets stomach  . Codeine Nausea Only  . Sulfamethoxazole-Trimethoprim Nausea Only     nausea    Medication list has been reviewed and updated.  Current Outpatient Prescriptions on File Prior to Visit  Medication Sig Dispense Refill  . aspirin 81 MG tablet Take 81 mg by mouth daily.     Marland Kitchen b complex vitamins capsule Take 1 capsule by mouth daily.    . Bepotastine Besilate 1.5 % SOLN Apply 1 drop to eye daily as needed (allergies).    . cephALEXin (KEFLEX) 500 MG capsule Take 1 capsule (500 mg total) by mouth 3 (three) times daily. 30 capsule 0  . cetirizine (ZYRTEC) 10 MG tablet Take 10 mg by mouth daily.    . clotrimazole-betamethasone (LOTRISONE) cream Apply 1 application topically 2 (two) times daily. 30 g 0  . diclofenac sodium (VOLTAREN) 1 % GEL Apply 1 application topically 2 (two) times daily as needed. 1 Tube 2  . Glucosamine HCl-MSM (MSM GLUCOSAMINE PO) Take by mouth.    . Multiple Vitamins-Minerals (MULTIVITAMIN PO) Take 1 tablet by mouth daily.      . Multiple Vitamins-Minerals (WOMENS BONE HEALTH PO) Take 2 tablets by mouth 3 (three) times daily.    . Omega-3 Fatty Acids (FISH OIL) 1000 MG CAPS Take 1 capsule by mouth daily.    Marland Kitchen triamcinolone (NASACORT) 55 MCG/ACT AERO nasal inhaler Place 2 sprays into the nose daily as needed (allergies).     . Vitamin D, Ergocalciferol, (DRISDOL) 50000 units CAPS capsule Take 1 capsule (50,000 Units total) by mouth every 7 (seven) days. 12 capsule 0    No current facility-administered medications on file prior to visit.     Review of Systems:  As per HPI- otherwise negative.   Physical Examination: Vitals:   11/29/15 1201  BP: 126/80  Pulse: 73  Temp: 98 F (36.7 C)   Vitals:   11/29/15 1201  Weight: 156 lb 8 oz (71 kg)   Body mass index is 27.72 kg/m. Ideal Body Weight:  GEN: WDWN, NAD, Non-toxic, A & O x 3, overweight, looks well HEENT: Atraumatic, Normocephalic. Neck supple. No masses, No LAD.  Bilateral TM wnl, oropharynx normal.  PEERL,EOMI.   Ears and Nose: No external deformity. CV: RRR, No M/G/R. No JVD. No thrill. No extra heart sounds. PULM: CTA B, no wheezes, crackles, rhonchi. No retractions. No resp. distress. No accessory muscle use. ABD: S, NT, ND, +BS. No rebound. No HSM.  abd is benign, no CVA tenderness EXTR: No c/c/e NEURO Normal gait.  Normal strength of all extremities, normal facial movment PSYCH: Normally interactive. Conversant. Not depressed or anxious appearing.  Calm demeanor.    Assessment and Plan: Acute cystitis without hematuria  Recent UTI- she was treated for presumed SIRS in the ER with rocephin, and is now doing well. Continue keflex until gone and let me know if sx return . Reassured that BPPV is a common and generally benign condition but she will watch for any worsening of these sx also   Signed Lamar Blinks, MD

## 2015-11-29 NOTE — Patient Instructions (Signed)
Continue to take the keflex antibiotic as directed, I think that you are on the right track and should continue to get better.

## 2015-12-02 LAB — CULTURE, BLOOD (ROUTINE X 2)
CULTURE: NO GROWTH
Culture: NO GROWTH

## 2015-12-03 ENCOUNTER — Encounter: Payer: Self-pay | Admitting: Family Medicine

## 2015-12-04 NOTE — Telephone Encounter (Signed)
Shanon Brow was advised to call the patient and schedule, I am unaware if it had been done or not, I will forward the message to him for clarification.     KP

## 2015-12-04 NOTE — Telephone Encounter (Signed)
Labs drew wrong tube --- they were supposed to call pt and tell her

## 2015-12-04 NOTE — Telephone Encounter (Signed)
The lab drew the wrong tube so it was not done--- I thought they were calling her to re do

## 2015-12-08 ENCOUNTER — Other Ambulatory Visit: Payer: Medicare Other

## 2015-12-08 ENCOUNTER — Other Ambulatory Visit: Payer: Self-pay

## 2015-12-08 ENCOUNTER — Encounter: Payer: Self-pay | Admitting: Family Medicine

## 2015-12-08 DIAGNOSIS — E785 Hyperlipidemia, unspecified: Secondary | ICD-10-CM

## 2015-12-08 DIAGNOSIS — R319 Hematuria, unspecified: Secondary | ICD-10-CM

## 2015-12-08 NOTE — Telephone Encounter (Signed)
We would need to recheck urine

## 2015-12-09 ENCOUNTER — Other Ambulatory Visit (INDEPENDENT_AMBULATORY_CARE_PROVIDER_SITE_OTHER): Payer: Medicare Other

## 2015-12-09 DIAGNOSIS — R319 Hematuria, unspecified: Secondary | ICD-10-CM

## 2015-12-09 DIAGNOSIS — R8290 Unspecified abnormal findings in urine: Secondary | ICD-10-CM

## 2015-12-09 LAB — POC URINALSYSI DIPSTICK (AUTOMATED)
Bilirubin, UA: NEGATIVE
Glucose, UA: NEGATIVE
Ketones, UA: NEGATIVE
Leukocytes, UA: NEGATIVE
NITRITE UA: NEGATIVE
PH UA: 6
PROTEIN UA: NEGATIVE
Spec Grav, UA: 1.025
UROBILINOGEN UA: NEGATIVE

## 2015-12-10 LAB — URINE CULTURE

## 2015-12-11 ENCOUNTER — Encounter: Payer: Self-pay | Admitting: Family Medicine

## 2015-12-16 ENCOUNTER — Encounter: Payer: Self-pay | Admitting: Family Medicine

## 2015-12-24 ENCOUNTER — Encounter: Payer: Self-pay | Admitting: Family Medicine

## 2015-12-24 NOTE — Telephone Encounter (Signed)
Spoke with Tillie Rung at [facility] and she is faxing result to nurses station. Awaiting report.

## 2016-01-02 ENCOUNTER — Encounter: Payer: Medicare Other | Admitting: Physician Assistant

## 2016-01-08 ENCOUNTER — Encounter: Payer: Self-pay | Admitting: Family Medicine

## 2016-01-18 NOTE — Progress Notes (Signed)
[name] [place], [place] [address] Phone: [phone] Subjective:      CC: bilateral foot pain Follow-up  [id number]  Kim Mclaughlin is a 70 y.o. female coming in with complaint of bilateral foot pain. Patient was found to have moderate to severe degenerative changes of the foot especially the midfoot. Patient was seen 3 months ago. We discussed proper shoes, icing protocol, given trial topical anti-inflammatories, and we discussed over-the-counter orthotics.  Patient seemed to be doing very well with conservative therapy. Patient states Unfortunately the weather is getting cooler and usually wearing closed toe shoes seem to aggravate it. Patient has been attempting it for the last week and is had difficulty. Having worsening pain. Has a very important trip with hiking in the spring and is wondering if surgical intervention would be beneficial. Still having pain in the midfoot as well as the second toe being the most concerning.      Past Medical History:  Diagnosis Date  . Arthritis 07/2014   Right hand, prescribed prednisone  . DVT (deep venous thrombosis) ([place])   . GERD (gastroesophageal reflux disease)   . Hepatitis    in 10 th grade  . Hyperlipidemia   . MVP (mitral valve prolapse)   . Osteopenia    [facility]  . PONV (postoperative nausea and vomiting)    with ear surgery only  . Precancerous lesion 12/2014   Treated with cream only  . Varicose veins    Past Surgical History:  Procedure Laterality Date  . APPENDECTOMY    . colonoscopy with polypectomy  07/2012    hyperplastic;Dr Olevia Perches  . DILATION AND CURETTAGE OF UTERUS    . DILATION AND CURETTAGE OF UTERUS N/A 09/03/2014   Procedure: DILATATION AND CURETTAGE;  Surgeon: Harle Battiest, MD;  Location: [facility];  Service: Gynecology;  Laterality: N/A;  . ENDOVENOUS ABLATION SAPHENOUS VEIN W/ LASER Left 01-31-2014   EVLA  left greater saphenous vein  by Curt Jews MD  . ENDOVENOUS ABLATION SAPHENOUS VEIN W/ LASER Right 03-14-2014   endovenous laser ablation right greater saphenous vein  by Curt Jews MD  . G 3 P 3    . STAPEDECTOMY    . TUBAL LIGATION     Social History   Social History  . Marital status: Married    Spouse name: N/A  . Number of children: N/A  . Years of education: N/A   Social History Main Topics  . Smoking status: Former Smoker    Quit date: 04/27/1963  . Smokeless tobacco: Never Used     Comment: Smoked < 6 mos;< 1 pack total;quit 1965  . Alcohol use No  . Drug use: No  . Sexual activity: Not Asked   Other Topics Concern  . None   Social History Narrative  . None   Allergies  Allergen Reactions  . Ciprofloxacin     ? reaction; no rash, fever or angioedema  . Methylphenidate Hcl   . Pravastatin Sodium     REACTION: shin pain  . Amoxicillin-Pot Clavulanate     GI symptoms; abdominal pain & nausea NOTE: able to take Ampicillin  . Augmentin [Amoxicillin-Pot Clavulanate] Nausea Only    Upsets stomach  . Codeine Nausea Only  . Sulfamethoxazole-Trimethoprim Nausea Only     nausea   Family History  Problem Relation Age of Onset  . Pancreatic cancer Mother   . Miscarriages / [place] Mother   . Thyroid disease  Mother     partial thyroidectomy ; S/P RAI  . Heart attack Father     in early 32s  . Colon cancer Father   . Colon cancer Paternal Grandmother   . Stroke Paternal Grandmother     > 65  . Heart attack Paternal Grandfather     in 61s  . Cancer      among paternal sibs (colon, breast, bone)  . Aneurysm Paternal Uncle     brain  . Ovarian cancer Maternal Aunt   . Breast cancer Paternal Aunt   . Diabetes Neg Hx     Past medical history, social, surgical and family history all reviewed in electronic medical record.  No pertanent information unless stated regarding to the chief complaint.   Review of Systems: No headache, visual changes, nausea, vomiting, diarrhea, constipation,  dizziness, abdominal pain, skin rash, fevers, chills, night sweats, weight loss, swollen lymph nodes, body aches, joint swelling, muscle aches, chest pain, shortness of breath, mood changes.   Objective  Blood pressure 130/82, pulse 73, weight 156 lb (70.8 kg), SpO2 96 %.  General: No apparent distress alert and oriented x3 mood and affect normal, dressed appropriately.  HEENT: Pupils equal, extraocular movements intact  Respiratory: Patient's speak in full sentences and does not appear short of breath  Cardiovascular: No lower extremity edema, non tender, no erythema  Skin: Warm dry intact with no signs of infection or rash on extremities or on axial skeleton.  Abdomen: Soft nontender  Neuro: Cranial nerves II through XII are intact, neurovascularly intact in all extremities with 2+ DTRs and 2+ pulses.  Lymph: No lymphadenopathy of posterior or anterior cervical chain or axillae bilaterally.  Gait normal with good balance and coordination.  MSK:  Non tender with full range of motion and good stability and symmetric strength and tone of shoulders, elbows, wrist, hip, knee and ankles bilaterally. Mild arthritic changes of multiple joints  Foot exam shows the patient does have bunion and bunionette formations of the first and fifth toes bilaterally. Patient does have severe breakdown of the transverse arch bilaterally. Patient's right foot shows postsurgical changes. Patient does have fusion of the second and third toes on right side. Neurovascularly intact distally. Patient does have mild overpronation of the hindfoot bilaterally.  Patient does have significant subluxation fibular deviation of the first toe on the left foot.  Patient though has overlapping of the second toe on the left foot over the first toe. Mild discomfort over the fifth metatarsal but very minimal. No inflammation or erythema noted.   Impression and Recommendations:     This case required medical decision making of moderate  complexity.      Note: This dictation was prepared with Dragon dictation along with smaller phrase technology. Any transcriptional errors that result from this process are unintentional.

## 2016-01-19 ENCOUNTER — Ambulatory Visit (INDEPENDENT_AMBULATORY_CARE_PROVIDER_SITE_OTHER): Payer: Medicare Other | Admitting: Family Medicine

## 2016-01-19 ENCOUNTER — Encounter: Payer: Self-pay | Admitting: Family Medicine

## 2016-01-19 VITALS — BP 130/82 | HR 73 | Wt 156.0 lb

## 2016-01-19 DIAGNOSIS — M19072 Primary osteoarthritis, left ankle and foot: Secondary | ICD-10-CM

## 2016-01-19 DIAGNOSIS — M19079 Primary osteoarthritis, unspecified ankle and foot: Secondary | ICD-10-CM | POA: Diagnosis not present

## 2016-01-19 DIAGNOSIS — M129 Arthropathy, unspecified: Secondary | ICD-10-CM | POA: Diagnosis not present

## 2016-01-19 MED ORDER — PREDNISONE 20 MG PO TABS: 40.0000 mg | ORAL_TABLET | Freq: Every day | ORAL | 0 refills | Status: DC

## 2016-01-19 NOTE — Assessment & Plan Note (Signed)
Degenerative changes of the foot noted. Patient also has severe breakdown of the second metatarsal causing some mild subluxation. Discussed with patient at great length. We discussed the possibility of custom orthotics the patient is looking for more long-term fix. Patient feels like she had made some mild benefits with certain shoes and conservative therapy but continues and pain. Patient once to be hiking in spring. Patient will be referred for further evaluation by orthopedic surgery to see if any fusion would be beneficial. Spent  25 minutes with patient face-to-face and had greater than 50% of counseling including as described above in assessment and plan.

## 2016-01-19 NOTE — Patient Instructions (Signed)
Good to see you  We will get you in with [facility]itt.  Ice is your friend Predniosne daily for 5 days then as needed B12 1074mcg daily  B6 200mg  daily  Xerelo shoes can help.  See me again in 6 weeks if you want and we can inject the foot

## 2016-01-27 ENCOUNTER — Ambulatory Visit (INDEPENDENT_AMBULATORY_CARE_PROVIDER_SITE_OTHER): Payer: Medicare Other | Admitting: Gastroenterology

## 2016-01-27 ENCOUNTER — Encounter: Payer: Self-pay | Admitting: Gastroenterology

## 2016-01-27 VITALS — BP 126/66 | HR 72 | Ht 63.0 in | Wt 154.0 lb

## 2016-01-27 DIAGNOSIS — Z800 Family history of malignant neoplasm of digestive organs: Secondary | ICD-10-CM | POA: Diagnosis not present

## 2016-01-27 DIAGNOSIS — K5909 Other constipation: Secondary | ICD-10-CM | POA: Diagnosis not present

## 2016-01-27 NOTE — Progress Notes (Signed)
[facility] Consult Note:  History: ZO MARET 01/27/2016  Referring physician: Ann Held, DO  Reason for consult/chief complaint: Constipation (very small BM's, full abdomen and not feeling like she fully eliminates her stool.) and Anorexia (recenty )   Subjective  HPI:  Domingo Cocking is here to see me for chronic constipation. Starting about 6 months ago, her stools seem to change character. They're still formed but become somewhat thinner, and there is been no improvement within increase fiber diet as well as trials of MiraLAX or align. She has an extensive family history of colorectal cancer including her father, paternal aunt and paternal grandmother. Apparently, that same aunt also had ovarian cancer.) Reports seeing her gynecologist about 2 or 3 months ago and described these change in bowel habits. She did not have any pelvic imaging, nuts when she was advised to take MiraLAX. She denies abdominal pain early satiety nausea vomiting rectal bleeding or unanticipated weight loss. Cytlaly 2009 colonoscopy was normal with a fair preparation, and in 2014 she had just a hyperplastic polyp.  ROS:  Review of Systems  Constitutional: Negative for appetite change and unexpected weight change.  HENT: Negative for mouth sores and voice change.   Eyes: Negative for pain and redness.  Respiratory: Negative for cough and shortness of breath.   Cardiovascular: Negative for chest pain and palpitations.  Genitourinary: Negative for dysuria and hematuria.  Musculoskeletal: Negative for arthralgias and myalgias.  Skin: Negative for pallor and rash.  Neurological: Negative for weakness and headaches.  Hematological: Negative for adenopathy.     Past Medical History: Past Medical History:  Diagnosis Date  . Arthritis 07/2014   Right hand, prescribed prednisone  . DVT (deep venous thrombosis) ([place])   . GERD (gastroesophageal reflux disease)   . Hepatitis    in 10 th  grade  . Hyperlipidemia   . MVP (mitral valve prolapse)   . Osteopenia    [facility]  . PONV (postoperative nausea and vomiting)    with ear surgery only  . Precancerous lesion 12/2014   Treated with cream only  . Varicose veins      Past Surgical History: Past Surgical History:  Procedure Laterality Date  . APPENDECTOMY    . colonoscopy with polypectomy  07/2012    hyperplastic;Dr Olevia Perches  . DILATION AND CURETTAGE OF UTERUS    . DILATION AND CURETTAGE OF UTERUS N/A 09/03/2014   Procedure: DILATATION AND CURETTAGE;  Surgeon: Harle Battiest, MD;  Location: [facility];  Service: Gynecology;  Laterality: N/A;  . ENDOVENOUS ABLATION SAPHENOUS VEIN W/ LASER Left 01-31-2014   EVLA  left greater saphenous vein  by Curt Jews MD  . ENDOVENOUS ABLATION SAPHENOUS VEIN W/ LASER Right 03-14-2014   endovenous laser ablation right greater saphenous vein  by Curt Jews MD  . G 3 P 3    . STAPEDECTOMY    . TUBAL LIGATION       Family History: Family History  Problem Relation Age of Onset  . Pancreatic cancer Mother   . Miscarriages / [place] Mother   . Thyroid disease Mother     partial thyroidectomy ; S/P RAI  . Heart attack Father     in early 37s  . Colon cancer Father   . Colon cancer Paternal Grandmother   . Stroke Paternal Grandmother     > 65  . Heart attack Paternal Grandfather     in 62s  . Cancer      among paternal  sibs (colon, breast, bone)  . Aneurysm Paternal Uncle     brain  . Ovarian cancer Maternal Aunt   . Breast cancer Paternal Aunt   . Diabetes Neg Hx     Social History: Social History   Social History  . Marital status: Married    Spouse name: N/A  . Number of children: N/A  . Years of education: N/A   Social History Main Topics  . Smoking status: Former Smoker    Quit date: 04/27/1963  . Smokeless tobacco: Never Used     Comment: Smoked < 6 mos;< 1 pack total;quit 1965  . Alcohol use No  . Drug use: No  . Sexual activity: Not on file    Other Topics Concern  . Not on file   Social History Narrative  . No narrative on file    Allergies: Allergies  Allergen Reactions  . Ciprofloxacin     ? reaction; no rash, fever or angioedema  . Methylphenidate Hcl   . Pravastatin Sodium     REACTION: shin pain  . Amoxicillin-Pot Clavulanate     GI symptoms; abdominal pain & nausea NOTE: able to take Ampicillin  . Augmentin [Amoxicillin-Pot Clavulanate] Nausea Only    Upsets stomach  . Codeine Nausea Only  . Sulfamethoxazole-Trimethoprim Nausea Only     nausea    Outpatient Meds: Current Outpatient Prescriptions  Medication Sig Dispense Refill  . aspirin 81 MG tablet Take 81 mg by mouth daily.     Marland Kitchen b complex vitamins capsule Take 1 capsule by mouth daily.    . Bepotastine Besilate 1.5 % SOLN Apply 1 drop to eye daily as needed (allergies).    . cetirizine (ZYRTEC) 10 MG tablet Take 10 mg by mouth daily.    . cholecalciferol (VITAMIN D) 1000 units tablet Take 1,000 Units by mouth daily.    . clotrimazole-betamethasone (LOTRISONE) cream Apply 1 application topically 2 (two) times daily. 30 g 0  . diclofenac sodium (VOLTAREN) 1 % GEL Apply 1 application topically 2 (two) times daily as needed. 1 Tube 2  . Glucosamine HCl-MSM (MSM GLUCOSAMINE PO) Take by mouth.    . Multiple Vitamins-Minerals (MULTIVITAMIN PO) Take 1 tablet by mouth daily.      . Multiple Vitamins-Minerals (WOMENS BONE HEALTH PO) Take 2 tablets by mouth 3 (three) times daily.    . Omega-3 Fatty Acids (FISH OIL) 1000 MG CAPS Take 2 capsules by mouth daily.     Marland Kitchen triamcinolone (NASACORT) 55 MCG/ACT AERO nasal inhaler Place 2 sprays into the nose daily as needed (allergies).      No current facility-administered medications for this visit.       ___________________________________________________________________ Objective   Exam:  BP 126/66   Pulse 72   Ht 5\' 3"  (1.6 m)   Wt 154 lb (69.9 kg)   BMI 27.28 kg/m    General: this is a(n)  Well-appearing elderly woman   Eyes: sclera anicteric, no redness  ENT: oral mucosa moist without lesions, no cervical or supraclavicular lymphadenopathy, good dentition  CV: RRR without murmur, S1/S2, no JVD, no peripheral edema  Resp: clear to auscultation bilaterally, normal RR and effort noted  GI: soft, no tenderness, with active bowel sounds. No guarding or palpable organomegaly noted.  Skin; warm and dry, no rash or jaundice noted  Neuro: awake, alert and oriented x 3. Normal gross motor function and fluent speech  Labs:  CBC Latest Ref Rng & Units 11/27/2015 06/30/2015 09/03/2014  WBC 4.0 -  10.5 K/uL 12.4(H) 5.8 4.5  Hemoglobin 12.0 - 15.0 g/dL 13.6 13.8 13.6  Hematocrit 36.0 - 46.0 % 39.5 40.7 41.3  Platelets 150 - 400 K/uL 287 313.0 268   CMP Latest Ref Rng & Units 11/27/2015 11/25/2015 11/07/2015  Glucose 65 - 99 mg/dL 120(H) 91 88  BUN 6 - 20 mg/dL 10 16 18   Creatinine 0.44 - 1.00 mg/dL 0.70 1.00 0.94  Sodium 135 - 145 mmol/L 137 141 141  Potassium 3.5 - 5.1 mmol/L 3.4(L) 4.1 4.1  Chloride 101 - 111 mmol/L 108 106 107  CO2 22 - 32 mmol/L 21(L) 27 27  Calcium 8.9 - 10.3 mg/dL 7.7(L) 10.1 9.4  Total Protein 6.5 - 8.1 g/dL 6.2(L) 7.5 7.2  Total Bilirubin 0.3 - 1.2 mg/dL 0.8 0.5 0.3  Alkaline Phos 38 - 126 U/L 62 79 75  AST 15 - 41 U/L 16 18 20   ALT 14 - 54 U/L 14 20 20     Normal TSH in March 2017  Assessment: Encounter Diagnoses  Name Primary?  . Other constipation Yes  . Family history of colon cancer     She has what sounds most likely to be a benign change in bowel habits, there are no associated symptoms, much less red flag symptoms. She does not have any pain to suggest ovarian neoplasm. Tumor bleeding her exam was unremarkable when seen by gynecology in July. Despite her family history, it is also reassuring that she had no adenomatous polyps on a colonoscopy with a good preparation just 3 years ago.  Plan:  I offered her a colonoscopy now versus a trial of  some constipation medication. She has opted to try Linzess 72 g once daily and she will call us in about 2 weeks with an update. If she is not much improved, we will schedule colonoscopy. If that is unrevealing, I will ask her to communicate with gynecology to see if pelvic imaging is necessary.  Thank you for the courtesy of this consult.  Please call me with any questions or concerns.  Nelida Meuse III  CC: Ann Held, DO  Dr Carlis Abbott, Ob/gyn

## 2016-01-27 NOTE — Patient Instructions (Addendum)
Please call Kim Mclaughlin in 2 weeks with an update on your symptoms.  Per our conversation, if you are not improved we will schedule a colonoscopy.  Medication Samples have been provided to the patient.  Drug name: Linzess       Strength: 128mcg        Qty: 12  LOTDS:[id number]  Exp.Date: 03-2016  Dosing instructions: please take one a day  The patient has been instructed regarding the correct time, dose, and frequency of taking this medication, including desired effects and most common side effects.   If you are age 54 or older, your body mass index should be between 23-30. Your Body mass index is 27.28 kg/m. If this is out of the aforementioned range listed, please consider follow up with your Primary Care Provider.  If you are age 78 or younger, your body mass index should be between 19-25. Your Body mass index is 27.28 kg/m. If this is out of the aformentioned range listed, please consider follow up with your Primary Care Provider.   Thank you for choosing [facility]  Dr Wilfrid Lund III

## 2016-02-02 ENCOUNTER — Encounter: Payer: Self-pay | Admitting: Family Medicine

## 2016-02-19 ENCOUNTER — Other Ambulatory Visit: Payer: Self-pay | Admitting: Obstetrics

## 2016-02-23 ENCOUNTER — Encounter: Payer: Self-pay | Admitting: Family Medicine

## 2016-03-01 ENCOUNTER — Ambulatory Visit: Payer: Medicare Other | Admitting: Family Medicine

## 2016-03-08 ENCOUNTER — Other Ambulatory Visit: Payer: Self-pay | Admitting: Orthopedic Surgery

## 2016-03-15 NOTE — Patient Instructions (Addendum)
Your procedure is scheduled on:  Thursday, Nov. 30, 2017  Enter through the Main Entrance of [facility] at:  6:00 AM  Pick up the phone at the desk and dial [phone].  Call this number if you have problems the morning of surgery: [phone].  Remember: Do NOT eat food or drink after:  Midnight Wednesday, Nov. 29, 2017  Take these medicines the morning of surgery with a SIP OF WATER:  None    Stop ALL herbal medications at this time   Do NOT wear jewelry (body piercing), metal hair clips/bobby pins, make-up, or nail polish. Do NOT wear lotions, powders, or perfumes.  You may wear deodorant. Do NOT shave for 48 hours prior to surgery. Do NOT bring valuables to the hospital. Contacts, dentures, or bridgework may not be worn into surgery.  Have a responsible adult drive you home and stay with you for 24 hours after your procedure  Bring a copy of living will paper work day of surgery

## 2016-03-16 ENCOUNTER — Encounter (HOSPITAL_COMMUNITY)
Admission: RE | Admit: 2016-03-16 | Discharge: 2016-03-16 | Disposition: A | Discharge status: Home / Self Care | Payer: Medicare Other | Source: Ambulatory Visit | Attending: Obstetrics | Admitting: Obstetrics

## 2016-03-16 ENCOUNTER — Encounter (HOSPITAL_COMMUNITY): Payer: Self-pay

## 2016-03-16 DIAGNOSIS — Z01812 Encounter for preprocedural laboratory examination: Secondary | ICD-10-CM | POA: Diagnosis present

## 2016-03-16 LAB — CBC
HEMATOCRIT: 41.1 % (ref 36.0–46.0)
HEMOGLOBIN: 14.1 g/dL (ref 12.0–15.0)
MCH: 31.3 pg (ref 26.0–34.0)
MCHC: 34.3 g/dL (ref 30.0–36.0)
MCV: 91.3 fL (ref 78.0–100.0)
Platelets: 306 10*3/uL (ref 150–400)
RBC: 4.5 MIL/uL (ref 3.87–5.11)
RDW: 13.2 % (ref 11.5–15.5)
WBC: 5.1 10*3/uL (ref 4.0–10.5)

## 2016-03-24 NOTE — H&P (Signed)
70 y.o. female presents for hysteroscopy, D&C for thickened / irregular endometrium and history of postmenopausal bleeding.  She was seen last month for a TVUS performed for pelvic discomfort, constipation, decreased appetite.  She has a family history of ovarian cancer in two aunts.  On US--neither ovaries visualized. No abnl structures in adnexa, no free fluid. Uterus small at 4 x 2 cm. Endometrial stripe thickened at 4.72mm, heterogenous. Small 5 x 4 x 5 mm solid area, possible polyp, some fluid in EM canal. No increased blood flow.  She denied recent postmenopausal bleeding.  While the endometrial stripe thickness would not require further work up, she had an episode of PMB 1.5 years ago.  EMS was thickened at that time.  She underwent a D&C in the OR and no endometrial tissue was obtained--therefore, work up was incomplete.    Her cervix is small and flush with the vaginal apex.  The cervix was stenotic and an attempt at in office EMB was unsuccessful at obtaining endometrial tissue.    She presents today for endometrial sampling with US guidance.  She took cytotec per vagina 4-5hr ago.     Past Medical History:  Diagnosis Date  . Arthritis 70/2016   Right hand, prescribed prednisone  . DVT (deep venous thrombosis) ([place])   . GERD (gastroesophageal reflux disease)    patient denies  . Hepatitis    in 10 th grade  . Hyperlipidemia   . MVP (mitral valve prolapse)   . Osteopenia    [facility]  . PONV (postoperative nausea and vomiting)    with ear surgery only  . Precancerous lesion 70/2016   Treated with cream only  . Varicose veins     Past Surgical History:  Procedure Laterality Date  . APPENDECTOMY    . colonoscopy with polypectomy  4/70 2014    hyperplastic;Dr Olevia Perches  . DILATION AND CURETTAGE OF UTERUS    . DILATION AND CURETTAGE OF UTERUS N/A 70/01/2015   Procedure: DILATATION AND CURETTAGE;  Surgeon: Harle Battiest, MD;  Location: [facility];  Service: Gynecology;   Laterality: N/A;  . ENDOVENOUS ABLATION SAPHENOUS VEIN W/ LASER Left 70-11-2013   EVLA  left greater saphenous vein  by Curt Jews MD  . ENDOVENOUS ABLATION SAPHENOUS VEIN W/ LASER Right 70-19-2015   endovenous laser ablation right greater saphenous vein  by Curt Jews MD  . G 3 P 3    . STAPEDECTOMY    . TUBAL LIGATION      OB History  No data available    Social History   Social History  . Marital status: Married    Spouse name: N/A  . Number of children: N/A  . Years of education: N/A   Occupational History  . Not on file.   Social History Main Topics  . Smoking status: Former Smoker    Quit date: 70/1/70  . Smokeless tobacco: Never Used     Comment: Smoked < 6 mos;< 1 pack total;quit 70  . Alcohol use No  . Drug use: No  . Sexual activity: Not on file   Other Topics Concern  . Not on file   Social History Narrative  . No narrative on file   Pravastatin sodium; Amoxicillin-pot clavulanate; Augmentin [amoxicillin-pot clavulanate]; Codeine; Methylphenidate hcl; and Sulfamethoxazole-trimethoprim   Vitals:   03/25/16 0616  BP: 138/80  Pulse: 89  Resp: 16  Temp: 98.5 F (36.9 C)     General:  NAD Abdomen:  soft  A/P   70 y.o.  Presents for hysteroscopy, dilation and curettage for thickened endometrial stripe, prior PMB and cervical stenosis.  Will use ultrasound guidance to assist in sampling the cavity.  Given history of postmenopausal bleeding with inadequate sampling of the endometrium and continued heterogeneous appearance of the endometrium, feel that sampling is prudent.  Discussed risks to include infection, bleeding, damage to surrounding structures (including but not limited to vagina, cervix, bladder, uterus), uterine perforation, inability to sample the endometrium, need for additional procedures.  All questions answered and patient elects to proceed.    [place]

## 2016-03-24 NOTE — Anesthesia Preprocedure Evaluation (Addendum)
Anesthesia Evaluation  Patient identified by MRN, date of birth, ID band Patient awake    Reviewed: Allergy & Precautions, NPO status , Patient's Chart, lab work & pertinent test results  History of Anesthesia Complications (+) PONV and history of anesthetic complications  Airway Mallampati: II  TM Distance: >3 FB Neck ROM: Full    Dental no notable dental hx. (+) Dental Advisory Given,    Pulmonary former smoker,    Pulmonary exam normal        Cardiovascular + Peripheral Vascular Disease and + DVT  Normal cardiovascular exam+ dysrhythmias + Valvular Problems/Murmurs MVP      Neuro/Psych negative neurological ROS  negative psych ROS   GI/Hepatic negative GI ROS, Neg liver ROS, GERD  Medicated,  Endo/Other  negative endocrine ROS  Renal/GU negative Renal ROS  negative genitourinary   Musculoskeletal negative musculoskeletal ROS (+) Arthritis ,   Abdominal   Peds negative pediatric ROS (+)  Hematology negative hematology ROS (+)   Anesthesia Other Findings   Reproductive/Obstetrics negative OB ROS                            Anesthesia Physical  Anesthesia Plan  ASA: III  Anesthesia Plan: General   Post-op Pain Management:    Induction: Intravenous  Airway Management Planned: LMA  Additional Equipment:   Intra-op Plan:   Post-operative Plan: Extubation in OR  Informed Consent: I have reviewed the patients History and Physical, chart, labs and discussed the procedure including the risks, benefits and alternatives for the proposed anesthesia with the patient or authorized representative who has indicated his/her understanding and acceptance.   Dental advisory given  Plan Discussed with: CRNA, Anesthesiologist and Surgeon  Anesthesia Plan Comments:        Anesthesia Quick Evaluation

## 2016-03-25 ENCOUNTER — Ambulatory Visit (HOSPITAL_COMMUNITY): Payer: Medicare Other

## 2016-03-25 ENCOUNTER — Encounter (HOSPITAL_COMMUNITY)
Admission: RE | Disposition: A | Discharge status: Home / Self Care | Payer: Self-pay | Source: Ambulatory Visit | Attending: Obstetrics

## 2016-03-25 ENCOUNTER — Ambulatory Visit (HOSPITAL_COMMUNITY)
Admission: RE | Admit: 2016-03-25 | Discharge: 2016-03-25 | Disposition: A | Discharge status: Home / Self Care | Payer: Medicare Other | Source: Ambulatory Visit | Attending: Obstetrics | Admitting: Obstetrics

## 2016-03-25 ENCOUNTER — Encounter (HOSPITAL_COMMUNITY): Payer: Self-pay

## 2016-03-25 ENCOUNTER — Ambulatory Visit (HOSPITAL_COMMUNITY): Payer: Medicare Other | Admitting: Anesthesiology

## 2016-03-25 DIAGNOSIS — N950 Postmenopausal bleeding: Secondary | ICD-10-CM | POA: Diagnosis present

## 2016-03-25 DIAGNOSIS — Z87891 Personal history of nicotine dependence: Secondary | ICD-10-CM | POA: Diagnosis not present

## 2016-03-25 DIAGNOSIS — M8580 Other specified disorders of bone density and structure, unspecified site: Secondary | ICD-10-CM | POA: Diagnosis not present

## 2016-03-25 DIAGNOSIS — I739 Peripheral vascular disease, unspecified: Secondary | ICD-10-CM | POA: Insufficient documentation

## 2016-03-25 DIAGNOSIS — Z79899 Other long term (current) drug therapy: Secondary | ICD-10-CM | POA: Diagnosis not present

## 2016-03-25 DIAGNOSIS — I341 Nonrheumatic mitral (valve) prolapse: Secondary | ICD-10-CM | POA: Diagnosis not present

## 2016-03-25 DIAGNOSIS — K219 Gastro-esophageal reflux disease without esophagitis: Secondary | ICD-10-CM | POA: Diagnosis not present

## 2016-03-25 DIAGNOSIS — E785 Hyperlipidemia, unspecified: Secondary | ICD-10-CM | POA: Insufficient documentation

## 2016-03-25 DIAGNOSIS — R58 Hemorrhage, not elsewhere classified: Secondary | ICD-10-CM

## 2016-03-25 DIAGNOSIS — M1990 Unspecified osteoarthritis, unspecified site: Secondary | ICD-10-CM | POA: Diagnosis not present

## 2016-03-25 DIAGNOSIS — N840 Polyp of corpus uteri: Secondary | ICD-10-CM | POA: Diagnosis not present

## 2016-03-25 HISTORY — PX: HYSTEROSCOPY WITH D & C: SHX1775

## 2016-03-25 SURGERY — DILATATION AND CURETTAGE /HYSTEROSCOPY
Anesthesia: General | Site: Vagina

## 2016-03-25 MED ORDER — LIDOCAINE HCL 1 % IJ SOLN
INTRAMUSCULAR | Status: AC
Start: 2016-03-25 — End: 2016-03-25
  Filled 2016-03-25: qty 20

## 2016-03-25 MED ORDER — FENTANYL CITRATE (PF) 100 MCG/2ML IJ SOLN
INTRAMUSCULAR | Status: DC | PRN
  Administered 2016-03-25: 50 ug via INTRAVENOUS
  Administered 2016-03-25: 25 ug via INTRAVENOUS
  Administered 2016-03-25: 50 ug via INTRAVENOUS

## 2016-03-25 MED ORDER — SODIUM CHLORIDE 0.9 % IJ SOLN
INTRAMUSCULAR | Status: AC
  Filled 2016-03-25: qty 10

## 2016-03-25 MED ORDER — EPHEDRINE 5 MG/ML INJ
INTRAVENOUS | Status: AC
  Filled 2016-03-25: qty 10

## 2016-03-25 MED ORDER — OXYCODONE HCL 5 MG PO TABS: ORAL_TABLET | ORAL | 0 refills | Status: DC

## 2016-03-25 MED ORDER — FENTANYL CITRATE (PF) 100 MCG/2ML IJ SOLN
INTRAMUSCULAR | Status: AC
  Filled 2016-03-25: qty 2

## 2016-03-25 MED ORDER — DIPHENHYDRAMINE HCL 50 MG/ML IJ SOLN
INTRAMUSCULAR | Status: AC
  Filled 2016-03-25: qty 1

## 2016-03-25 MED ORDER — DIPHENHYDRAMINE HCL 50 MG/ML IJ SOLN
INTRAMUSCULAR | Status: DC | PRN
  Administered 2016-03-25: 12.5 mg via INTRAVENOUS

## 2016-03-25 MED ORDER — PROPOFOL 10 MG/ML IV BOLUS
INTRAVENOUS | Status: DC | PRN
  Administered 2016-03-25: 180 mg via INTRAVENOUS

## 2016-03-25 MED ORDER — ONDANSETRON HCL 4 MG/2ML IJ SOLN
INTRAMUSCULAR | Status: AC
  Filled 2016-03-25: qty 2

## 2016-03-25 MED ORDER — KETOROLAC TROMETHAMINE 30 MG/ML IJ SOLN
INTRAMUSCULAR | Status: DC | PRN
  Administered 2016-03-25: 15 mg via INTRAVENOUS

## 2016-03-25 MED ORDER — IBUPROFEN 600 MG PO TABS: 600.0000 mg | ORAL_TABLET | Freq: Four times a day (QID) | ORAL | 0 refills | Status: DC | PRN

## 2016-03-25 MED ORDER — PROMETHAZINE HCL 25 MG/ML IJ SOLN: 6.2500 mg | INTRAMUSCULAR | Status: DC | PRN

## 2016-03-25 MED ORDER — HYDROMORPHONE HCL 1 MG/ML IJ SOLN: 0.2500 mg | INTRAMUSCULAR | Status: DC | PRN

## 2016-03-25 MED ORDER — DEXAMETHASONE SODIUM PHOSPHATE 4 MG/ML IJ SOLN
INTRAMUSCULAR | Status: AC
  Filled 2016-03-25: qty 1

## 2016-03-25 MED ORDER — DEXAMETHASONE SODIUM PHOSPHATE 10 MG/ML IJ SOLN
INTRAMUSCULAR | Status: DC | PRN
  Administered 2016-03-25: 4 mg via INTRAVENOUS

## 2016-03-25 MED ORDER — ONDANSETRON HCL 4 MG/2ML IJ SOLN
INTRAMUSCULAR | Status: DC | PRN
  Administered 2016-03-25: 4 mg via INTRAVENOUS

## 2016-03-25 MED ORDER — PROPOFOL 10 MG/ML IV BOLUS
INTRAVENOUS | Status: AC
  Filled 2016-03-25: qty 20

## 2016-03-25 MED ORDER — LACTATED RINGERS IV SOLN
INTRAVENOUS | Status: DC
  Administered 2016-03-25: 125 mL/h via INTRAVENOUS
  Administered 2016-03-25: 09:00:00 via INTRAVENOUS

## 2016-03-25 MED ORDER — LIDOCAINE HCL (CARDIAC) 20 MG/ML IV SOLN
INTRAVENOUS | Status: AC
  Filled 2016-03-25: qty 5

## 2016-03-25 MED ORDER — KETOROLAC TROMETHAMINE 30 MG/ML IJ SOLN
INTRAMUSCULAR | Status: AC
  Filled 2016-03-25: qty 1

## 2016-03-25 MED ORDER — LIDOCAINE HCL (CARDIAC) 20 MG/ML IV SOLN
INTRAVENOUS | Status: DC | PRN
  Administered 2016-03-25: 30 mg via INTRAVENOUS
  Administered 2016-03-25: 70 mg via INTRAVENOUS

## 2016-03-25 MED ORDER — EPHEDRINE SULFATE 50 MG/ML IJ SOLN
INTRAMUSCULAR | Status: DC | PRN
  Administered 2016-03-25 (×2): 15 mg via INTRAVENOUS

## 2016-03-25 SURGICAL SUPPLY — 17 items
BIPOLAR CUTTING LOOP 21FR (ELECTRODE)
CATH ROBINSON RED A/P 16FR (CATHETERS) IMPLANT
CLOTH BEACON ORANGE TIMEOUT ST (SAFETY) ×2 IMPLANT
CONTAINER PREFILL 10% NBF 60ML (FORM) ×4 IMPLANT
GLOVE BIO SURGEON STRL SZ 6 (GLOVE) ×2 IMPLANT
GLOVE BIOGEL PI IND STRL 6.5 (GLOVE) ×1 IMPLANT
GLOVE BIOGEL PI IND STRL 7.0 (GLOVE) ×1 IMPLANT
GLOVE BIOGEL PI INDICATOR 6.5 (GLOVE) ×1
GLOVE BIOGEL PI INDICATOR 7.0 (GLOVE) ×1
GOWN STRL REUS W/TWL LRG LVL3 (GOWN DISPOSABLE) ×4 IMPLANT
LOOP CUTTING BIPOLAR 21FR (ELECTRODE) IMPLANT
PACK VAGINAL MINOR WOMEN LF (CUSTOM PROCEDURE TRAY) ×2 IMPLANT
PAD OB MATERNITY 4.3X12.25 (PERSONAL CARE ITEMS) ×2 IMPLANT
TOWEL OR 17X24 6PK STRL BLUE (TOWEL DISPOSABLE) ×4 IMPLANT
TUBING AQUILEX INFLOW (TUBING) ×2 IMPLANT
TUBING AQUILEX OUTFLOW (TUBING) ×2 IMPLANT
WATER STERILE IRR 1000ML POUR (IV SOLUTION) ×2 IMPLANT

## 2016-03-25 NOTE — Brief Op Note (Signed)
03/25/2016  8:24 AM  PATIENT:  Kim Mclaughlin  70 y.o. female  PRE-OPERATIVE DIAGNOSIS:  Thickened Endometrium and Postmenopausal Bleeding  POST-OPERATIVE DIAGNOSIS: Endometrial polyp and Post Menopausal Bleeding  PROCEDURE:  Procedure(s) with comments: DILATATION AND CURETTAGE /HYSTEROSCOPY (N/A) - WITH ULTRASOUND GUIDANCE  SURGEON:  Surgeon(s) and Role:    * Jerelyn Charles, MD - Primary  ANESTHESIA:   general  EBL:  Total I/O In: 900 [I.V.:900] Out: 5 [Blood:5]  BLOOD ADMINISTERED:none  DRAINS: none   LOCAL MEDICATIONS USED:  NONE  SPECIMEN:  Source of Specimen:  endometrial polyp  DISPOSITION OF SPECIMEN:  PATHOLOGY  COUNTS:  YES  TOURNIQUET:  * No tourniquets in log *  DICTATION: .Note written in EPIC  PLAN OF CARE: Discharge to home after PACU  PATIENT DISPOSITION:  PACU - hemodynamically stable.   Delay start of Pharmacological VTE agent (>24hrs) due to surgical blood loss or risk of bleeding: yes

## 2016-03-25 NOTE — Anesthesia Procedure Notes (Signed)
Performed by: Tobin Chad Pre-anesthesia Checklist: Patient identified, Patient being monitored, Suction available and Emergency Drugs available Patient Re-evaluated:Patient Re-evaluated prior to inductionOxygen Delivery Method: Circle system utilized Preoxygenation: Pre-oxygenation with 100% oxygen Intubation Type: IV induction Ventilation: Mask ventilation without difficulty LMA: LMA inserted LMA Size: 4.0 Number of attempts: 1 Placement Confirmation: positive ETCO2 and breath sounds checked- equal and bilateral

## 2016-03-25 NOTE — Anesthesia Postprocedure Evaluation (Signed)
Anesthesia Post Note  Patient: Kim Mclaughlin  Procedure(s) Performed: Procedure(s) (LRB): DILATATION AND CURETTAGE /HYSTEROSCOPY (N/A)  Patient location during evaluation: PACU Anesthesia Type: General Level of consciousness: sedated Pain management: pain level controlled Vital Signs Assessment: post-procedure vital signs reviewed and stable Respiratory status: spontaneous breathing and respiratory function stable Cardiovascular status: stable Anesthetic complications: no     Last Vitals:  Vitals:   03/25/16 0915 03/25/16 0930  BP: 126/74 121/60  Pulse: 86 77  Resp: 20 16  Temp:  36.9 C    Last Pain:  Vitals:   03/25/16 0616  TempSrc: Oral   Pain Goal: Patients Stated Pain Goal: 5 (03/25/16 0616)               [place]

## 2016-03-25 NOTE — Discharge Instructions (Signed)
Pelvic rest x 2 weeks (no intercourse or tampons).    Do not drive until you are not taking narcotic pain medication.   Call your doctor if you have heavy vaginal bleeding (soaking through a pad an hour or more for >2 hours in a row), temperature >101F, severe nausea, vomiting, severe or worsening abdominal pain, dizziness, shortness of breath, chest pain or any other concerns.  Please take motrin every 6 hours.  Add oxycodone if your pain is not controlled by motrin alone.    DISCHARGE INSTRUCTIONS: HYSTEROSCOPY  The following instructions have been prepared to help you care for yourself upon your return home.    May take Ibuprofen after 2:00pm.   May take stool softner while taking narcotic pain medication to prevent constipation.  Drink plenty of water.  Personal hygiene:  Use sanitary pads for vaginal drainage, not tampons.  Shower the day after your procedure.  NO tub baths, pools or Jacuzzis for 2-3 weeks.  Wipe front to back after using the bathroom.  Activity and limitations:  Do NOT drive or operate any equipment for 24 hours. The effects of anesthesia are still present and drowsiness may result.  Do NOT rest in bed all day.  Walking is encouraged.  Walk up and down stairs slowly.  You may resume your normal activity in one to two days or as indicated by your physician. Sexual activity: NO intercourse for at least 2 weeks after the procedure, or as indicated by your Doctor.  Diet: Eat a light meal as desired this evening. You may resume your usual diet tomorrow.  Return to Work: You may resume your work activities in one to two days or as indicated by Marine scientist.  What to expect after your surgery: Expect to have vaginal bleeding/discharge for 2-3 days and spotting for up to 10 days. It is not unusual to have soreness for up to 1-2 weeks. You may have a slight burning sensation when you urinate for the first day. Mild cramps may continue for a couple  of days. You may have a regular period in 2-6 weeks.  Call your doctor for any of the following:  Excessive vaginal bleeding or clotting, saturating and changing one pad every hour.  Inability to urinate 6 hours after discharge from hospital.  Pain not relieved by pain medication.  Fever of 100.4 F or greater.  Unusual vaginal discharge or odor.  Return to office _________________Call for an appointment ___________________ Patients signature: ______________________ Nurses signature ________________________  [place] [facility] [phone]

## 2016-03-25 NOTE — Transfer of Care (Signed)
Immediate Anesthesia Transfer of Care Note  Patient: Kim Mclaughlin  Procedure(s) Performed: Procedure(s) with comments: DILATATION AND CURETTAGE /HYSTEROSCOPY (N/A) - WITH ULTRASOUND GUIDANCE  Patient Location: PACU  Anesthesia Type:General  Level of Consciousness: awake, alert , oriented and patient cooperative  Airway & Oxygen Therapy: Patient Spontanous Breathing and Patient connected to nasal cannula oxygen  Post-op Assessment: Report given to RN and Post -op Vital signs reviewed and stable  Post vital signs: Reviewed and stable  Last Vitals:  Vitals:   03/25/16 0616  BP: 138/80  Pulse: 89  Resp: 16  Temp: 36.9 C    Last Pain:  Vitals:   03/25/16 0616  TempSrc: Oral      Patients Stated Pain Goal: 5 ([id number])  Complications: No apparent anesthesia complications

## 2016-03-25 NOTE — Op Note (Signed)
Operative Note  Pre-operative Diagnosis: post menopausal bleeding and thickened endometrial stripe  Post-operative Diagnosis: endometrial polyps  Surgeon: Jerelyn Charles, MD  Procedure: hysteroscopy, polypectomy, dilation and curettage under ultrasound guidance  Anesthesia: general  Estimated Blood Loss: 5 mL         Drains: none         Specimens: endometrial polyps to pathology         Findings: Cervix flush with vaginal apex.  Small anteverted uterus Thin, atrophic endometrium with 2 intrauterine polyps identified.   Description of Procedure:         After adequate anesthesia was achieved, the patient placed in the dorsal lithotomy position in [place].  She was prepped and draped in the usual sterile fashion.  A bimanual exam revealed an anteverted uterus and the cervix flush with the vaginal apex.  Ultrasound guidance was used for the entirety of the following procedure.  The bivalve speculum was placed in the vagina and the anterior lip of the cervix grasped with a single-tooth tenaculum.  The cervix was serially dilated with Hank dilators to 77mm.  Under direct visualization, the hysteroscope was advanced.  The uterine cavity was surveyed. Both tubal ostia were identified. The endometrium was noted to be thin and atrophic.  There were two endometrial polyps identified.   Given the small and stenotic cervix, it was not felt that dilation to accommodate the operative MyoSure device could be accomplished.  Therefore, again using ultrasound guidance, the polyp forceps were advanced into the uterine cavity and the polyps resected in pieces.  The hysteroscope was again advanced into the cavity.  The cavity was clear with the exception of a small fragment of the base of one polyp.  The hysteroscope was removed and a sharp curettage was performed.  The polyps and scant endometrial curettings were sent to pathology.  The hysteroscope was removed.  All the vaginal instruments were removed.   Counts were correct.  The patient was awakened from anesthesia and transferred to PACU in stable condition.    [place], [facility]

## 2016-03-26 ENCOUNTER — Encounter (HOSPITAL_COMMUNITY): Payer: Self-pay | Admitting: Obstetrics

## 2016-04-16 ENCOUNTER — Encounter (HOSPITAL_BASED_OUTPATIENT_CLINIC_OR_DEPARTMENT_OTHER): Payer: Self-pay | Admitting: *Deleted

## 2016-04-22 ENCOUNTER — Ambulatory Visit (HOSPITAL_BASED_OUTPATIENT_CLINIC_OR_DEPARTMENT_OTHER): Payer: Medicare Other | Admitting: Certified Registered"

## 2016-04-22 ENCOUNTER — Encounter (HOSPITAL_BASED_OUTPATIENT_CLINIC_OR_DEPARTMENT_OTHER)
Admission: RE | Disposition: A | Discharge status: Home / Self Care | Payer: Self-pay | Source: Ambulatory Visit | Attending: Orthopedic Surgery

## 2016-04-22 ENCOUNTER — Ambulatory Visit (HOSPITAL_BASED_OUTPATIENT_CLINIC_OR_DEPARTMENT_OTHER)
Admission: RE | Admit: 2016-04-22 | Discharge: 2016-04-22 | Disposition: A | Discharge status: Home / Self Care | Payer: Medicare Other | Source: Ambulatory Visit | Attending: Orthopedic Surgery | Admitting: Orthopedic Surgery

## 2016-04-22 ENCOUNTER — Encounter (HOSPITAL_BASED_OUTPATIENT_CLINIC_OR_DEPARTMENT_OTHER): Payer: Self-pay | Admitting: Certified Registered"

## 2016-04-22 DIAGNOSIS — Z880 Allergy status to penicillin: Secondary | ICD-10-CM | POA: Insufficient documentation

## 2016-04-22 DIAGNOSIS — M25375 Other instability, left foot: Secondary | ICD-10-CM | POA: Diagnosis not present

## 2016-04-22 DIAGNOSIS — I739 Peripheral vascular disease, unspecified: Secondary | ICD-10-CM | POA: Diagnosis not present

## 2016-04-22 DIAGNOSIS — M2042 Other hammer toe(s) (acquired), left foot: Secondary | ICD-10-CM | POA: Insufficient documentation

## 2016-04-22 DIAGNOSIS — K219 Gastro-esophageal reflux disease without esophagitis: Secondary | ICD-10-CM | POA: Diagnosis not present

## 2016-04-22 DIAGNOSIS — M1990 Unspecified osteoarthritis, unspecified site: Secondary | ICD-10-CM | POA: Insufficient documentation

## 2016-04-22 DIAGNOSIS — Z87891 Personal history of nicotine dependence: Secondary | ICD-10-CM | POA: Diagnosis not present

## 2016-04-22 DIAGNOSIS — Z7982 Long term (current) use of aspirin: Secondary | ICD-10-CM | POA: Insufficient documentation

## 2016-04-22 DIAGNOSIS — M7742 Metatarsalgia, left foot: Secondary | ICD-10-CM | POA: Diagnosis not present

## 2016-04-22 DIAGNOSIS — I341 Nonrheumatic mitral (valve) prolapse: Secondary | ICD-10-CM | POA: Diagnosis not present

## 2016-04-22 DIAGNOSIS — M21612 Bunion of left foot: Secondary | ICD-10-CM | POA: Diagnosis not present

## 2016-04-22 DIAGNOSIS — Z86718 Personal history of other venous thrombosis and embolism: Secondary | ICD-10-CM | POA: Insufficient documentation

## 2016-04-22 DIAGNOSIS — E785 Hyperlipidemia, unspecified: Secondary | ICD-10-CM | POA: Insufficient documentation

## 2016-04-22 HISTORY — PX: BUNIONECTOMY WITH HAMMERTOE RECONSTRUCTION: SHX5600

## 2016-04-22 HISTORY — PX: FOOT ARTHRODESIS: SHX1655

## 2016-04-22 HISTORY — PX: TARSAL METATARSAL FUSION WITH WEIL OSTEOTOMY: SHX5676

## 2016-04-22 SURGERY — FUSION, JOINT, FOOT
Anesthesia: General | Site: Foot | Laterality: Left

## 2016-04-22 MED ORDER — EPHEDRINE SULFATE 50 MG/ML IJ SOLN
INTRAMUSCULAR | Status: DC | PRN
  Administered 2016-04-22: 10 mg via INTRAVENOUS

## 2016-04-22 MED ORDER — CHLORHEXIDINE GLUCONATE 4 % EX LIQD: 60.0000 mL | Freq: Once | CUTANEOUS | Status: DC

## 2016-04-22 MED ORDER — DOCUSATE SODIUM 100 MG PO CAPS: 100.0000 mg | ORAL_CAPSULE | Freq: Two times a day (BID) | ORAL | 0 refills | Status: DC

## 2016-04-22 MED ORDER — CEFAZOLIN SODIUM-DEXTROSE 2-4 GM/100ML-% IV SOLN
2.0000 g | INTRAVENOUS | Status: AC
  Administered 2016-04-22: 2 g via INTRAVENOUS

## 2016-04-22 MED ORDER — 0.9 % SODIUM CHLORIDE (POUR BTL) OPTIME
TOPICAL | Status: DC | PRN
  Administered 2016-04-22: 250 mL

## 2016-04-22 MED ORDER — FENTANYL CITRATE (PF) 100 MCG/2ML IJ SOLN
INTRAMUSCULAR | Status: AC
  Filled 2016-04-22: qty 2

## 2016-04-22 MED ORDER — SCOPOLAMINE 1 MG/3DAYS TD PT72: 1.0000 | MEDICATED_PATCH | Freq: Once | TRANSDERMAL | Status: DC | PRN

## 2016-04-22 MED ORDER — LIDOCAINE HCL (CARDIAC) 20 MG/ML IV SOLN
INTRAVENOUS | Status: DC | PRN
  Administered 2016-04-22: 30 mg via INTRAVENOUS

## 2016-04-22 MED ORDER — FENTANYL CITRATE (PF) 100 MCG/2ML IJ SOLN
50.0000 ug | INTRAMUSCULAR | Status: DC | PRN
  Administered 2016-04-22: 50 ug via INTRAVENOUS

## 2016-04-22 MED ORDER — CEFAZOLIN SODIUM-DEXTROSE 2-4 GM/100ML-% IV SOLN
INTRAVENOUS | Status: AC
  Filled 2016-04-22: qty 100

## 2016-04-22 MED ORDER — ENOXAPARIN SODIUM 40 MG/0.4ML ~~LOC~~ SOLN: 40.0000 mg | SUBCUTANEOUS | 0 refills | Status: DC

## 2016-04-22 MED ORDER — SODIUM CHLORIDE 0.9 % IV SOLN: INTRAVENOUS | Status: DC

## 2016-04-22 MED ORDER — DEXAMETHASONE SODIUM PHOSPHATE 10 MG/ML IJ SOLN
INTRAMUSCULAR | Status: DC | PRN
  Administered 2016-04-22: 10 mg via INTRAVENOUS

## 2016-04-22 MED ORDER — BUPIVACAINE-EPINEPHRINE (PF) 0.5% -1:200000 IJ SOLN
INTRAMUSCULAR | Status: DC | PRN
  Administered 2016-04-22: 30 mL via PERINEURAL

## 2016-04-22 MED ORDER — ONDANSETRON HCL 4 MG/2ML IJ SOLN
INTRAMUSCULAR | Status: AC
  Filled 2016-04-22: qty 20

## 2016-04-22 MED ORDER — DEXAMETHASONE SODIUM PHOSPHATE 10 MG/ML IJ SOLN
INTRAMUSCULAR | Status: AC
  Filled 2016-04-22: qty 2

## 2016-04-22 MED ORDER — EPHEDRINE 5 MG/ML INJ
INTRAVENOUS | Status: AC
  Filled 2016-04-22: qty 10

## 2016-04-22 MED ORDER — ONDANSETRON HCL 4 MG/2ML IJ SOLN
INTRAMUSCULAR | Status: DC | PRN
  Administered 2016-04-22: 4 mg via INTRAVENOUS

## 2016-04-22 MED ORDER — LACTATED RINGERS IV SOLN
INTRAVENOUS | Status: DC
  Administered 2016-04-22 (×2): via INTRAVENOUS

## 2016-04-22 MED ORDER — PROPOFOL 10 MG/ML IV BOLUS
INTRAVENOUS | Status: DC | PRN
  Administered 2016-04-22: 200 mg via INTRAVENOUS

## 2016-04-22 MED ORDER — SENNA 8.6 MG PO TABS: 2.0000 | ORAL_TABLET | Freq: Two times a day (BID) | ORAL | 0 refills | Status: DC

## 2016-04-22 MED ORDER — ONDANSETRON HCL 4 MG PO TABS: 4.0000 mg | ORAL_TABLET | Freq: Every day | ORAL | 0 refills | Status: DC | PRN

## 2016-04-22 MED ORDER — MIDAZOLAM HCL 2 MG/2ML IJ SOLN
1.0000 mg | INTRAMUSCULAR | Status: DC | PRN
  Administered 2016-04-22: 1 mg via INTRAVENOUS

## 2016-04-22 MED ORDER — OXYCODONE HCL 5 MG PO TABS: 5.0000 mg | ORAL_TABLET | ORAL | 0 refills | Status: DC | PRN

## 2016-04-22 MED ORDER — MIDAZOLAM HCL 2 MG/2ML IJ SOLN
INTRAMUSCULAR | Status: AC
  Filled 2016-04-22: qty 2

## 2016-04-22 MED ORDER — LIDOCAINE 2% (20 MG/ML) 5 ML SYRINGE
INTRAMUSCULAR | Status: AC
  Filled 2016-04-22: qty 25

## 2016-04-22 MED ORDER — PROPOFOL 500 MG/50ML IV EMUL
INTRAVENOUS | Status: AC
  Filled 2016-04-22: qty 100

## 2016-04-22 SURGICAL SUPPLY — 84 items
BANDAGE ESMARK 6X9 LF (GAUZE/BANDAGES/DRESSINGS) ×1 IMPLANT
BIT DRILL 1.8 CANN MAX VPC (BIT) ×2 IMPLANT
BIT DRILL 2.4 AO COUPLING CANN (BIT) ×2 IMPLANT
BIT DRILL 5/64X5 DISP (BIT) ×2 IMPLANT
BLADE AVERAGE 25X9 (BLADE) IMPLANT
BLADE LONG MED 25X9 (BLADE) ×2 IMPLANT
BLADE MICRO SAGITTAL (BLADE) ×2 IMPLANT
BLADE MINI RND TIP GREEN BEAV (BLADE) IMPLANT
BLADE OSC/SAG .038X5.5 CUT EDG (BLADE) IMPLANT
BLADE SURG 15 STRL LF DISP TIS (BLADE) ×2 IMPLANT
BLADE SURG 15 STRL SS (BLADE) ×2
BNDG COHESIVE 4X5 TAN STRL (GAUZE/BANDAGES/DRESSINGS) ×2 IMPLANT
BNDG COHESIVE 6X5 TAN STRL LF (GAUZE/BANDAGES/DRESSINGS) ×2 IMPLANT
BNDG CONFORM 3 STRL LF (GAUZE/BANDAGES/DRESSINGS) ×2 IMPLANT
BNDG ESMARK 6X9 LF (GAUZE/BANDAGES/DRESSINGS) ×2
CHLORAPREP W/TINT 26ML (MISCELLANEOUS) ×2 IMPLANT
COVER BACK TABLE 60X90IN (DRAPES) ×2 IMPLANT
CUFF TOURNIQUET SINGLE 34IN LL (TOURNIQUET CUFF) ×2 IMPLANT
DECANTER SPIKE VIAL GLASS SM (MISCELLANEOUS) IMPLANT
DRAPE EXTREMITY T 121X128X90 (DRAPE) ×2 IMPLANT
DRAPE OEC MINIVIEW 54X84 (DRAPES) ×2 IMPLANT
DRAPE U-SHAPE 47X51 STRL (DRAPES) ×2 IMPLANT
DRSG MEPITEL 4X7.2 (GAUZE/BANDAGES/DRESSINGS) ×2 IMPLANT
DRSG PAD ABDOMINAL 8X10 ST (GAUZE/BANDAGES/DRESSINGS) ×4 IMPLANT
ELECT REM PT RETURN 9FT ADLT (ELECTROSURGICAL) ×2
ELECTRODE REM PT RTRN 9FT ADLT (ELECTROSURGICAL) ×1 IMPLANT
GAUZE SPONGE 4X4 12PLY STRL (GAUZE/BANDAGES/DRESSINGS) ×2 IMPLANT
GLOVE BIO SURGEON STRL SZ8 (GLOVE) ×2 IMPLANT
GLOVE BIOGEL PI IND STRL 7.0 (GLOVE) ×2 IMPLANT
GLOVE BIOGEL PI IND STRL 8 (GLOVE) ×2 IMPLANT
GLOVE BIOGEL PI INDICATOR 7.0 (GLOVE) ×2
GLOVE BIOGEL PI INDICATOR 8 (GLOVE) ×2
GLOVE ECLIPSE 6.5 STRL STRAW (GLOVE) ×2 IMPLANT
GLOVE ECLIPSE 7.5 STRL STRAW (GLOVE) ×2 IMPLANT
GOWN STRL REUS W/ TWL LRG LVL3 (GOWN DISPOSABLE) ×1 IMPLANT
GOWN STRL REUS W/ TWL XL LVL3 (GOWN DISPOSABLE) ×2 IMPLANT
GOWN STRL REUS W/TWL LRG LVL3 (GOWN DISPOSABLE) ×1
GOWN STRL REUS W/TWL XL LVL3 (GOWN DISPOSABLE) ×2
K-WIRE .054X4 (WIRE) IMPLANT
K-WIRE .062X4 (WIRE) IMPLANT
K-WIRE COCR 0.9X95 (WIRE) ×2
K-WIRE TROC 1.25X150 (WIRE) ×2
KWIRE COCR 0.9X95 (WIRE) ×1 IMPLANT
KWIRE TROC 1.25X150 (WIRE) ×1 IMPLANT
NDL SAFETY ECLIPSE 18X1.5 (NEEDLE) IMPLANT
NEEDLE HYPO 18GX1.5 SHARP (NEEDLE)
NEEDLE HYPO 22GX1.5 SAFETY (NEEDLE) IMPLANT
NEEDLE HYPO 25X1 1.5 SAFETY (NEEDLE) IMPLANT
NS IRRIG 1000ML POUR BTL (IV SOLUTION) ×2 IMPLANT
PACK BASIN DAY SURGERY FS (CUSTOM PROCEDURE TRAY) ×2 IMPLANT
PAD CAST 4YDX4 CTTN HI CHSV (CAST SUPPLIES) ×1 IMPLANT
PADDING CAST ABS 4INX4YD NS (CAST SUPPLIES)
PADDING CAST ABS COTTON 4X4 ST (CAST SUPPLIES) IMPLANT
PADDING CAST COTTON 4X4 STRL (CAST SUPPLIES) ×1
PADDING CAST COTTON 6X4 STRL (CAST SUPPLIES) ×2 IMPLANT
PENCIL BUTTON HOLSTER BLD 10FT (ELECTRODE) ×2 IMPLANT
SANITIZER HAND PURELL 535ML FO (MISCELLANEOUS) ×2 IMPLANT
SCREW CANNULATED NS 4MMX36MM (Screw) ×2 IMPLANT
SCREW CORT 3.5X40 (Screw) ×2 IMPLANT
SCREW HCS TWIST-OFF 2.0X12MM (Screw) ×4 IMPLANT
SCREW HCS TWIST-OFF 2.0X14MM (Screw) ×2 IMPLANT
SCREW MAX VPC  2.5X20 (Screw) ×1 IMPLANT
SCREW MAX VPC 2.5X20 (Screw) ×1 IMPLANT
SHEET MEDIUM DRAPE 40X70 STRL (DRAPES) ×2 IMPLANT
SLEEVE SCD COMPRESS KNEE MED (MISCELLANEOUS) ×2 IMPLANT
SPLINT FAST PLASTER 5X30 (CAST SUPPLIES) ×20
SPLINT PLASTER CAST FAST 5X30 (CAST SUPPLIES) ×20 IMPLANT
SPONGE LAP 18X18 X RAY DECT (DISPOSABLE) ×2 IMPLANT
SPONGE SURGIFOAM ABS GEL 12-7 (HEMOSTASIS) IMPLANT
STOCKINETTE 6  STRL (DRAPES) ×1
STOCKINETTE 6 STRL (DRAPES) ×1 IMPLANT
SUCTION FRAZIER HANDLE 10FR (MISCELLANEOUS) ×1
SUCTION TUBE FRAZIER 10FR DISP (MISCELLANEOUS) ×1 IMPLANT
SUT ETHILON 3 0 PS 1 (SUTURE) ×6 IMPLANT
SUT MNCRL AB 3-0 PS2 18 (SUTURE) ×2 IMPLANT
SUT VIC AB 0 SH 27 (SUTURE) IMPLANT
SUT VIC AB 2-0 SH 27 (SUTURE) ×1
SUT VIC AB 2-0 SH 27XBRD (SUTURE) ×1 IMPLANT
SUT VICRYL 0 UR6 27IN ABS (SUTURE) IMPLANT
SYR BULB 3OZ (MISCELLANEOUS) ×2 IMPLANT
SYR CONTROL 10ML LL (SYRINGE) IMPLANT
TOWEL OR 17X24 6PK STRL BLUE (TOWEL DISPOSABLE) ×4 IMPLANT
TUBE CONNECTING 20X1/4 (TUBING) ×2 IMPLANT
UNDERPAD 30X30 (UNDERPADS AND DIAPERS) ×2 IMPLANT

## 2016-04-22 NOTE — H&P (Signed)
Kim Mclaughlin is an 70 y.o. female.   Chief Complaint: left foot pain HPI: 70 y/o female with left painful bunion, metatarsalgia and recurrent hammertoe deformity.  She has failed non op treatment and presents today for surgical correction of these painful forefoot deformities.    Past Medical History:  Diagnosis Date  . Arthritis 07/2014   Right hand, prescribed prednisone-no longer taking. also Rt knww  . DVT (deep venous thrombosis) ([place]) 2015   very small; treated with compression and heat  . GERD (gastroesophageal reflux disease)    patient denies states she does have hx of peptic ulcer many years  . Hepatitis    in 10 th grade  (not sure type- not hep C)  . Hyperlipidemia   . MVP (mitral valve prolapse)   . Osteopenia    [facility]  . PONV (postoperative nausea and vomiting)    with ear surgery only  . Precancerous lesion 12/2014   Treated with cream only  . Varicose veins     Past Surgical History:  Procedure Laterality Date  . APPENDECTOMY    . colonoscopy with polypectomy  07/2012    hyperplastic;Dr Olevia Perches  . DILATION AND CURETTAGE OF UTERUS    . DILATION AND CURETTAGE OF UTERUS N/A 09/03/2014   Procedure: DILATATION AND CURETTAGE;  Surgeon: Harle Battiest, MD;  Location: [facility];  Service: Gynecology;  Laterality: N/A;  . ENDOVENOUS ABLATION SAPHENOUS VEIN W/ LASER Left 01-31-2014   EVLA  left greater saphenous vein  by Curt Jews MD  . ENDOVENOUS ABLATION SAPHENOUS VEIN W/ LASER Right 03-14-2014   endovenous laser ablation right greater saphenous vein  by Curt Jews MD  . G 3 P 3    . HYSTEROSCOPY W/D&C N/A 03/25/2016   Procedure: DILATATION AND CURETTAGE /HYSTEROSCOPY;  Surgeon: Jerelyn Charles, MD;  Location: [facility];  Service: Gynecology;  Laterality: N/A;  WITH ULTRASOUND GUIDANCE  . STAPEDECTOMY    . TUBAL LIGATION      Family History  Problem Relation Age of Onset  . Pancreatic cancer Mother   . Miscarriages / [place] Mother   . Thyroid  disease Mother     partial thyroidectomy ; S/P RAI  . Heart attack Father     in early 41s  . Colon cancer Father   . Colon cancer Paternal Grandmother   . Stroke Paternal Grandmother     > 65  . Heart attack Paternal Grandfather     in 74s  . Cancer      among paternal sibs (colon, breast, bone)  . Aneurysm Paternal Uncle     brain  . Ovarian cancer Maternal Aunt   . Breast cancer Paternal Aunt   . Diabetes Neg Hx    Social History:  reports that she quit smoking about 53 years ago. She has never used smokeless tobacco. She reports that she does not drink alcohol or use drugs.  Allergies:  Allergies  Allergen Reactions  . Pravastatin Sodium     REACTION: shin pain  . Amoxicillin-Pot Clavulanate     GI symptoms; abdominal pain & nausea NOTE: able to take Ampicillin  . Augmentin [Amoxicillin-Pot Clavulanate] Nausea Only    Upsets stomach  . Codeine Nausea Only  . Methylphenidate Hcl Palpitations  . Sulfamethoxazole-Trimethoprim Nausea Only     nausea    Medications Prior to Admission  Medication Sig Dispense Refill  . aspirin 81 MG tablet Take 81 mg by mouth daily.     . Aspirin-Acetaminophen-Caffeine (  EXCEDRIN PO) Take by mouth.    . B Complex-C (SUPER B COMPLEX PO) Take 1 tablet by mouth daily.    Marland Kitchen CALCIUM PO Take 770 mg by mouth.    . cetirizine (ZYRTEC) 10 MG tablet Take 10 mg by mouth daily.    . cholecalciferol (VITAMIN D) 1000 units tablet Take 1,000 Units by mouth daily.    . clotrimazole-betamethasone (LOTRISONE) cream Apply 1 application topically 2 (two) times daily. (Patient taking differently: Apply 1 application topically 2 (two) times daily as needed (rash on wrist). ) 30 g 0  . Glucosamine HCl-MSM (MSM GLUCOSAMINE PO) Take 2 tablets by mouth daily.     Marland Kitchen ibuprofen (ADVIL,MOTRIN) 600 MG tablet Take 1 tablet (600 mg total) by mouth every 6 (six) hours as needed. 30 tablet 0  . Multiple Vitamins-Minerals (CENTRUM SILVER PO) Take 1 tablet by mouth daily.     . Omega-3 Fatty Acids (FISH OIL) 1000 MG CAPS Take 1 capsule by mouth daily.     Marland Kitchen OVER THE COUNTER MEDICATION Take 1 tablet by mouth 3 (three) times daily. [employer] Bone Strength supplement    . triamcinolone (NASACORT) 55 MCG/ACT AERO nasal inhaler Place 2 sprays into the nose daily as needed (allergies).     . Bepotastine Besilate 1.5 % SOLN Apply 1 drop to eye daily as needed (allergies).    Marland Kitchen oxyCODONE (ROXICODONE) 5 MG immediate release tablet Take 1/2 or 1 tablet q6h as needed for severe pain 5 tablet 0    No results found for this or any previous visit (from the past 48 hour(s)). No results found.  ROS  No recent f/c/n/v/w tloss  Blood pressure (!) 133/58, pulse 69, temperature 98.1 F (36.7 C), temperature source Oral, resp. rate 13, height 5\' 3"  (1.6 m), weight 69.5 kg (153 lb 2 oz), SpO2 100 %. Physical Exam  wn wd woman in nad.  A and o x4.  Mood and affect normal.  EOMI.  resp unlabored.  L forefoot with significant bunion deformity.  Skin healthy and intact.  TTP beneath metatarsal heads.  No lymphadenopathy.  5/5 strength in PF and DF of the ankle and toes.  Sens to LT intact at the forefoot dorsally and plantarly.  Brisk cap refill at the toes.  Assessment/Plan L bunion, metatarsalgia and hammertoe - to OR for surgical correction.  The risks and benefits of the alternative treatment options have been discussed in detail.  The patient wishes to proceed with surgery and specifically understands risks of bleeding, infection, nerve damage, blood clots, need for additional surgery, amputation and death.   Wylene Simmer, MD 2016/05/02, 8:36 AM

## 2016-04-22 NOTE — H&P (Signed)
Assisted Dr. Smith Robert with left, ultrasound guided, popliteal block. Side rails up, monitors on throughout procedure. See vital signs in flow sheet. Tolerated Procedure well.

## 2016-04-22 NOTE — Transfer of Care (Signed)
Immediate Anesthesia Transfer of Care Note  Patient: Kim Mclaughlin  Procedure(s) Performed: Procedure(s): LEFT FIRST TARSAL METATARSAL ARTHRODESIS (Left) MODIFIED MCBRIDE BUNIONECTOMY,  LEFT SECOND HAMMERTOE CORRECTION (Left) 2-4 TARSAL METATARSAL FUSION WITH WEIL OSTEOTOMY (Left)  Patient Location: PACU  Anesthesia Type:GA combined with regional for post-op pain  Level of Consciousness: awake and patient cooperative  Airway & Oxygen Therapy: Patient Spontanous Breathing and Patient connected to face mask oxygen  Post-op Assessment: Report given to RN and Post -op Vital signs reviewed and stable  Post vital signs: Reviewed and stable  Last Vitals:  Vitals:   04/22/16 0830 04/22/16 0832  BP: (!) 133/58   Pulse: 71 69  Resp: 13 13  Temp:      Last Pain:  Vitals:   04/22/16 0800  TempSrc: Oral         Complications: No apparent anesthesia complications

## 2016-04-22 NOTE — Anesthesia Procedure Notes (Signed)
Procedure Name: LMA Insertion Date/Time: 04/22/2016 8:58 AM Performed by: Illene Sweeting D Pre-anesthesia Checklist: Patient identified, Emergency Drugs available, Suction available and Patient being monitored Patient Re-evaluated:Patient Re-evaluated prior to inductionOxygen Delivery Method: Circle system utilized Preoxygenation: Pre-oxygenation with 100% oxygen Intubation Type: IV induction Ventilation: Mask ventilation without difficulty LMA: LMA inserted LMA Size: 3.0 Number of attempts: 1 Airway Equipment and Method: Bite block Placement Confirmation: positive ETCO2 Tube secured with: Tape Dental Injury: Teeth and Oropharynx as per pre-operative assessment

## 2016-04-22 NOTE — Brief Op Note (Signed)
04/22/2016  10:22 AM  PATIENT:  Kim Mclaughlin  70 y.o. female  PRE-OPERATIVE DIAGNOSIS: 1.  Left bunion      2.  Left 2-4 metatarsalgia      3.  Left 2nd hammertoe      4.  Left 1st TMT joint instability  POST-OPERATIVE DIAGNOSIS: same  Procedure(s): 1.  Left modified McBride bunion correction   2.  Left 1st TMT Lapidus arthrodesis (separate incision)   3.  Left 2nd MT Weil osteotomy (separate incision)   4.  Left 2nd MTPJ dorsal capsulotomy and extensor tendon lengthening   5.  Left 3rd MT Weil osteotomy (separate incision)   6.  Left 4th MT Weil osteotomy (separate incision)   7.  Left 2nd hammertoe correction (separate incision)   8.  Left foot AP, lateral and oblique xrAys   SURGEON:  Wylene Simmer, MD  ASSISTANT: Mechele Claude, PA-C  ANESTHESIA:   General, regional  EBL:  minimal   TOURNIQUET:   Total Tourniquet Time Documented: Thigh (Left) - 50 minutes Total: Thigh (Left) - 50 minutes  COMPLICATIONS:  None apparent  DISPOSITION:  Extubated, awake and stable to recovery.  DICTATION ID:  [id number]

## 2016-04-22 NOTE — Anesthesia Procedure Notes (Signed)
Anesthesia Regional Block:  Popliteal block  Pre-Anesthetic Checklist: ,, timeout performed, Correct Patient, Correct Site, Correct Laterality, Correct Procedure, Correct Position, site marked, Risks and benefits discussed,  Surgical consent,  Pre-op evaluation,  At surgeon's request and post-op pain management  Laterality: Left  Prep: chloraprep       Needles:  Injection technique: Single-shot  Needle Type: Echogenic Needle     Needle Length: 9cm 9 cm Needle Gauge: 21 and 21 G    Additional Needles:  Procedures: ultrasound guided (picture in chart) Popliteal block Narrative:  Start time: 04/22/2016 8:29 AM End time: 04/22/2016 8:34 AM Injection made incrementally with aspirations every 5 mL.  Performed by: Personally  Anesthesiologist: Suella Broad D  Additional Notes: Pt tolerated well.

## 2016-04-22 NOTE — Anesthesia Preprocedure Evaluation (Addendum)
Anesthesia Evaluation  Patient identified by MRN, date of birth, ID band Patient awake    Reviewed: Allergy & Precautions, NPO status , Patient's Chart, lab work & pertinent test results  History of Anesthesia Complications (+) PONV and history of anesthetic complications  Airway Mallampati: III  TM Distance: >3 FB Neck ROM: Full    Dental  (+) Teeth Intact, Dental Advisory Given   Pulmonary former smoker,    breath sounds clear to auscultation       Cardiovascular + Peripheral Vascular Disease  + Valvular Problems/Murmurs MVP  Rhythm:Regular Rate:Normal     Neuro/Psych negative neurological ROS  negative psych ROS   GI/Hepatic GERD  ,(+) Hepatitis -  Endo/Other  negative endocrine ROS  Renal/GU negative Renal ROS  negative genitourinary   Musculoskeletal  (+) Arthritis ,   Abdominal   Peds negative pediatric ROS (+)  Hematology negative hematology ROS (+)   Anesthesia Other Findings   Reproductive/Obstetrics negative OB ROS                            Lab Results  Component Value Date   WBC 5.1 03/16/2016   HGB 14.1 03/16/2016   HCT 41.1 03/16/2016   MCV 91.3 03/16/2016   PLT 306 03/16/2016   Lab Results  Component Value Date   CREATININE 0.70 11/27/2015   BUN 10 11/27/2015   NA 137 11/27/2015   K 3.4 (L) 11/27/2015   CL 108 11/27/2015   CO2 21 (L) 11/27/2015   No results found for: INR, PROTIME  EKG: normal sinus rhythm.  Anesthesia Physical Anesthesia Plan  ASA: II  Anesthesia Plan: General   Post-op Pain Management: GA combined w/ Regional for post-op pain   Induction: Intravenous  Airway Management Planned: LMA  Additional Equipment:   Intra-op Plan:   Post-operative Plan: Extubation in OR  Informed Consent: I have reviewed the patients History and Physical, chart, labs and discussed the procedure including the risks, benefits and alternatives for the  proposed anesthesia with the patient or authorized representative who has indicated his/her understanding and acceptance.   Dental advisory given  Plan Discussed with: CRNA  Anesthesia Plan Comments:         Anesthesia Quick Evaluation

## 2016-04-22 NOTE — Op Note (Signed)
NAMEGWENDLOYN, SAFRAN NO.:  [id number]  MEDICAL RECORD NO.:  [id number]  LOCATION:                                 FACILITY:  PHYSICIAN:  Wylene Simmer, MD        DATE OF BIRTH:  [birth year]  DATE OF PROCEDURE:  04/22/2016 DATE OF DISCHARGE:                              OPERATIVE REPORT   PREOPERATIVE DIAGNOSES: 1. Left painful bunion deformity. 2. Left second, third and fourth metatarsalgia. 3. Left second hammertoe deformity. 4. Left first tarsometatarsal joint instability.  POSTOPERATIVE DIAGNOSES: 1. Left painful bunion deformity. 2. Left second, third and fourth metatarsalgia. 3. Left second hammertoe deformity. 4. Left first tarsometatarsal joint instability.  PROCEDURE: 1. Left modified McBride bunion correction. 2. Left first tarsometatarsal joint Lapidus arthrodesis through a     separate incision. 3. Left second metatarsal Weil osteotomy through a separate incision. 4. Left second metatarsophalangeal joint dorsal capsulotomy and     extensor tendon lengthening. 5. Left second hammertoe correction through a separate incision. 6. Left third metatarsal Weil osteotomy through a separate incision. 7. Left fourth metatarsal Weil osteotomy through a separate incision. 8. Left foot AP, lateral, and oblique radiographs.  SURGEON:  Wylene Simmer, MD.  ASSISTANT:  Mechele Claude, PA-C.  ANESTHESIA:  General, regional.  ESTIMATED BLOOD LOSS:  Minimal.  TOURNIQUET TIME:  50 minutes at 250 mmHg.  COMPLICATIONS:  None apparent.  DISPOSITION:  Extubated, awake, and stable to recovery.  INDICATIONS FOR PROCEDURE:  The patient is a 70 year old woman with a history of left forefoot pain due to a severe bunion deformity as well as second, third and fourth metatarsalgia and a second hammertoe deformity.  She has failed nonoperative treatment to date including activity modification, oral anti-inflammatories, shoe wear modification. She presents now for  operative correction of these painful conditions. She understands the risks and benefits of the alternative treatment options and elects surgical treatment.  She specifically understands risks of bleeding, infection, nerve damage, blood clots, need for additional surgery, continued pain, recurrence of her deformities, amputation and death.  PROCEDURE IN DETAIL:  After preoperative consent was obtained and the correct operative site was identified, the patient was brought to the operating room and placed supine on the operating table.  General anesthesia was induced.  Preoperative antibiotics were administered. Surgical time-out was taken.  Left lower extremity was prepped and draped in standard sterile fashion with a tourniquet around the thigh. The extremity was exsanguinated, the tourniquet was inflated to 250 mmHg.  An incision was then made at the dorsum of the first webspace. Sharp dissection was carried down through the skin and subcutaneous tissue.  The intermetatarsal ligament was divided.  An arthrotomy was then made between the sesamoid and the metatarsal head.  Small perforations were then made in the lateral joint capsule.  The hallux could then be positioned in 20 degrees of varus passively.  Attention was then turned to the medial forefoot where longitudinal incision was made over the medial eminence.  Sharp dissection was carried down through the skin and subcutaneous tissue.  The medial joint capsule was incised and elevated plantarly and dorsally.  Hypertrophic medial eminence was then resected in line with  the first metatarsal shaft with the oscillating saw.  Attention was then turned to the dorsum of the foot where a longitudinal incision was made at first TMT joint.  Sharp dissection was carried down through skin and subcutaneous tissue.  The interval between the tibialis anterior and EHL was developed.  The first TMT joint capsule was incised and elevated.  The  oscillating saw was used to resect the articular cartilage and subchondral bone from both sides of the joint.  More bone was taken laterally than medially allowing correction of the intermetatarsal angle.  The wound was irrigated copiously.  Perforations were made on both sides of the joint with a small drill bit.  The joint was reduced and provisionally pinned.  AP and lateral radiographs confirmed appropriate correction of the bunion deformity and appropriate position of the guide pin.  The guide pin was then over drilled and a Biomet 4-mm partially-threaded cannulated screw was inserted.  This was noted to compress the joint appropriately.  A second Biomet screw was inserted from distal to proximal after drilling.  This was a 3.5-mm LPS screw.  AP and lateral radiographs confirmed appropriate position of both screws and appropriate compression of the first TMT joint.  Attention was then turned to the second MTP joint where a dorsal capsulotomy was made.  The dorsal joint capsule was excised in its entirety, and the extensor digitorum longus and brevis tendons were lengthened in Z-fashion.  The metatarsal head was exposed and a Weil osteotomy was made with the oscillating saw.  The head was allowed to retract proximally and the osteotomy was fixed with a 2-mm Biomet FRS screw.  The overhanging bone was trimmed with a rongeur.  A longitudinal incision was then made over the third and fourth MTP joints and both bones were shortened again using Weil osteotomy performed in the same fashion.  Attention was then turned to the second toe where a transverse incision was made over the PIP joint.  Sharp dissection was carried down through the skin and subcutaneous tissue and extensor mechanism.  The head of the proximal phalanx was resected followed by the base of the middle phalanx.  The joint was reduced and fixed with a 2.5-mm Biomet VPC screw.  Final AP, oblique, and lateral radiographs of  the foot showed appropriate position and length of all hardware and appropriate correction of the bunion and hammertoe deformities.  No other acute injuries are noted.  At this point, the wounds were irrigated.  The medial joint capsule was repaired with imbricating sutures of 2-0 Vicryl.  Subcutaneous tissues were approximated with 3-0 Monocryl.  Skin incisions were closed with 3-0 nylon.  Sterile dressings were applied followed by a bunion wrap and a well-padded short-leg splint. Tourniquet was released after application of the dressings at 50 minutes.  The patient was awakened from anesthesia and transported to the recovery room in stable condition.  FOLLOWUP PLAN:  The patient will be nonweightbearing on the left lower extremity.  Due to history of DVT, she will have Lovenox 40 mg daily for 2 weeks and then switch to 81 mg of aspirin b.i.d. while she is immobilized and nonweightbearing.  She will follow up with me in the office in 2 weeks for suture removal and conversion to a short-leg cast. She does not need a toe spacer.  Mechele Claude, PA-C, was present and scrubbed for the duration of the case.  His assistance was essential in positioning the patient, prepping and draping, gaining and maintaining  exposure, performing the operation, closing and dressing the wounds, and applying the splint.  RADIOGRAPHS:  AP, oblique, and lateral radiographs of the left foot were obtained intraoperatively.  These show interval correction of the bunion and second hammertoe deformities.  Hardware is appropriately positioned and of the appropriate length.     Wylene Simmer, MD     JH/MEDQ  D:  04/22/2016  T:  04/22/2016  Job:  [id number]

## 2016-04-22 NOTE — Discharge Instructions (Addendum)
Wylene Simmer, MD [place]  Please read the following information regarding your care after surgery.  Medications  You only need a prescription for the narcotic pain medicine (ex. oxycodone, Percocet, Norco).  All of the other medicines listed below are available over the counter. X acetominophen (Tylenol) 650 mg every 4-6 hours as you need for minor pain X oxycodone as prescribed for moderate to severe pain X aleve 220 mg - 2 tablets twice daily with food as needed for pain X zofran as prescribed for nausea   Narcotic pain medicine (ex. oxycodone, Percocet, Vicodin) will cause constipation.  To prevent this problem, take the following medicines while you are taking any pain medicine. X docusate sodium (Colace) 100 mg twice a day X senna (Senokot) 2 tablets twice a day  X To help prevent blood clots, inject lovenox once a day for two weeks after surgery, then take 81 mg of aspirin twice daily until you are allowed to bear weight on your operative extremity.  You should also get up every hour while you are awake to move around.    Weight Bearing X Do not bear any weight on the operated leg or foot.  Cast / Splint / Dressing X Keep your splint or cast clean and dry.  Dont put anything (coat hanger, pencil, etc) down inside of it.  If it gets damp, use a hair dryer on the cool setting to dry it.  If it gets soaked, call the office to schedule an appointment for a cast change.  After your dressing, cast or splint is removed; you may shower, but do not soak or scrub the wound.  Allow the water to run over it, and then gently pat it dry.  Swelling It is normal for you to have swelling where you had surgery.  To reduce swelling and pain, keep your toes above your nose for at least 3 days after surgery.  It may be necessary to keep your foot or leg elevated for several weeks.  If it hurts, it should be elevated.  Follow Up Call my office at [phone] when you are discharged from  the hospital or surgery center to schedule an appointment to be seen two weeks after surgery.  Call my office at [phone] if you develop a fever >101.5 F, nausea, vomiting, bleeding from the surgical site or severe pain.    Post Anesthesia Home Care Instructions  Activity: Get plenty of rest for the remainder of the day. A responsible adult should stay with you for 24 hours following the procedure.  For the next 24 hours, DO NOT: -Drive a car -Paediatric nurse -Drink alcoholic beverages -Take any medication unless instructed by your physician -Make any legal decisions or sign important papers.  Meals: Start with liquid foods such as gelatin or soup. Progress to regular foods as tolerated. Avoid greasy, spicy, heavy foods. If nausea and/or vomiting occur, drink only clear liquids until the nausea and/or vomiting subsides. Call your physician if vomiting continues.  Special Instructions/Symptoms: Your throat may feel dry or sore from the anesthesia or the breathing tube placed in your throat during surgery. If this causes discomfort, gargle with warm salt water. The discomfort should disappear within 24 hours.  If you had a scopolamine patch placed behind your ear for the management of post- operative nausea and/or vomiting:  1. The medication in the patch is effective for 72 hours, after which it should be removed.  Wrap patch in a tissue and discard in the  trash. Wash hands thoroughly with soap and water. 2. You may remove the patch earlier than 72 hours if you experience unpleasant side effects which may include dry mouth, dizziness or visual disturbances. 3. Avoid touching the patch. Wash your hands with soap and water after contact with the patch.   Regional Anesthesia Blocks  1. Numbness or the inability to move the "blocked" extremity may last from 3-48 hours after placement. The length of time depends on the medication injected and your individual response to the medication.  If the numbness is not going away after 48 hours, call your surgeon.  2. The extremity that is blocked will need to be protected until the numbness is gone and the  Strength has returned. Because you cannot feel it, you will need to take extra care to avoid injury. Because it may be weak, you may have difficulty moving it or using it. You may not know what position it is in without looking at it while the block is in effect.  3. For blocks in the legs and feet, returning to weight bearing and walking needs to be done carefully. You will need to wait until the numbness is entirely gone and the strength has returned. You should be able to move your leg and foot normally before you try and bear weight or walk. You will need someone to be with you when you first try to ensure you do not fall and possibly risk injury.  4. Bruising and tenderness at the needle site are common side effects and will resolve in a few days.  5. Persistent numbness or new problems with movement should be communicated to the surgeon or the [facility] [phone] [place] [phone]).

## 2016-04-22 NOTE — Anesthesia Postprocedure Evaluation (Signed)
Anesthesia Post Note  Patient: Kim Mclaughlin  Procedure(s) Performed: Procedure(s) (LRB): LEFT FIRST TARSAL METATARSAL ARTHRODESIS (Left) MODIFIED MCBRIDE BUNIONECTOMY,  LEFT SECOND HAMMERTOE CORRECTION (Left) 2-4 TARSAL METATARSAL FUSION WITH WEIL OSTEOTOMY (Left)  Patient location during evaluation: PACU Anesthesia Type: General and Regional Level of consciousness: awake and alert Pain management: pain level controlled Vital Signs Assessment: post-procedure vital signs reviewed and stable Respiratory status: spontaneous breathing, nonlabored ventilation, respiratory function stable and patient connected to nasal cannula oxygen Cardiovascular status: blood pressure returned to baseline and stable Postop Assessment: no signs of nausea or vomiting Anesthetic complications: no       Last Vitals:  Vitals:   04/22/16 1030 04/22/16 1045  BP: 131/79 129/77  Pulse: 75 74  Resp: 10 12  Temp:      Last Pain:  Vitals:   04/22/16 1030  TempSrc:   PainSc: 0-No pain                 Effie Berkshire

## 2016-04-23 ENCOUNTER — Encounter (HOSPITAL_BASED_OUTPATIENT_CLINIC_OR_DEPARTMENT_OTHER): Payer: Self-pay | Admitting: Orthopedic Surgery

## 2016-07-01 ENCOUNTER — Encounter: Payer: Medicare Other | Admitting: Family Medicine

## 2016-07-27 NOTE — Progress Notes (Signed)
Subjective:   Kim Mclaughlin is a 71 y.o. female who presents for Medicare Annual (Subsequent) preventive examination.  Review of Systems:  No ROS.  Medicare Wellness Visit. Cardiac Risk Factors include: advanced age (>28men, >24 women);dyslipidemia Sleep patterns:  Sleeps about 8 hrs per night.  Home Safety/Smoke Alarms:  Feels safe in home. Smoke alarms in place.  Living environment; residence and Firearm Safety: Lives with husband. Guns safely stored. Seat Belt Safety/Bike Helmet: Wears seat belt.   Counseling:   Eye Exam- Wears glasses. Dr.Jicha annually. Dental- Dr.Feldon every 6 months.  Female:   Pap-   Dr.Diana Clark annually or more often as needed.    Mammo- Last 10/18/15: No evidence of malignancy        Dexa scan- Last 06/20/15: osteopenia       CCS- Last 08/18/12:  Polyp (s) removed were not precancerous. Repeat in 10 yrs per report.    Objective:     Vitals: BP 126/60 (BP Location: Left Arm, Cuff Size: Normal)   Pulse 75   Temp 98 F (36.7 C) (Oral)   Resp 16   Ht 5\' 3"  (1.6 m)   Wt 157 lb 3.2 oz (71.3 kg)   SpO2 97%   BMI 27.85 kg/m   Body mass index is 27.85 kg/m.   Tobacco History  Smoking Status  . Former Smoker  . Quit date: 04/27/1963  Smokeless Tobacco  . Never Used    Comment: Smoked < 6 mos;< 1 pack total;quit 1965     Counseling given: Not Answered   Past Medical History:  Diagnosis Date  . Arthritis 07/2014   Right hand, prescribed prednisone-no longer taking. also Rt knww  . DVT (deep venous thrombosis) ([place]) 2015   very small; treated with compression and heat  . GERD (gastroesophageal reflux disease)    patient denies states she does have hx of peptic ulcer many years  . Hepatitis    in 10 th grade  (not sure type- not hep C)  . Hyperlipidemia   . MVP (mitral valve prolapse)   . Osteopenia    [facility]  . PONV (postoperative nausea and vomiting)    with ear surgery only  . Precancerous lesion 12/2014   Treated with cream only    . Varicose veins    Past Surgical History:  Procedure Laterality Date  . APPENDECTOMY    . BUNIONECTOMY WITH HAMMERTOE RECONSTRUCTION Left 04/22/2016   Procedure: MODIFIED MCBRIDE BUNIONECTOMY,  LEFT SECOND HAMMERTOE CORRECTION;  Surgeon: Wylene Simmer, MD;  Location: [place];  Service: Orthopedics;  Laterality: Left;  . colonoscopy with polypectomy  07/2012    hyperplastic;Dr Olevia Perches  . DILATION AND CURETTAGE OF UTERUS    . DILATION AND CURETTAGE OF UTERUS N/A 09/03/2014   Procedure: DILATATION AND CURETTAGE;  Surgeon: Harle Battiest, MD;  Location: [facility];  Service: Gynecology;  Laterality: N/A;  . ENDOVENOUS ABLATION SAPHENOUS VEIN W/ LASER Left 01-31-2014   EVLA  left greater saphenous vein  by Curt Jews MD  . ENDOVENOUS ABLATION SAPHENOUS VEIN W/ LASER Right 03-14-2014   endovenous laser ablation right greater saphenous vein  by Curt Jews MD  . FOOT ARTHRODESIS Left 04/22/2016   Procedure: LEFT FIRST TARSAL METATARSAL ARTHRODESIS;  Surgeon: Wylene Simmer, MD;  Location: [place];  Service: Orthopedics;  Laterality: Left;  Marland Kitchen G 3 P 3    . HYSTEROSCOPY W/D&C N/A 03/25/2016   Procedure: DILATATION AND CURETTAGE /HYSTEROSCOPY;  Surgeon: Beryle Quant  Carlis Abbott, MD;  Location: [facility];  Service: Gynecology;  Laterality: N/A;  WITH ULTRASOUND GUIDANCE  . STAPEDECTOMY    . TARSAL METATARSAL FUSION WITH WEIL OSTEOTOMY Left 04/22/2016   Procedure: 2-4 TARSAL METATARSAL FUSION WITH WEIL OSTEOTOMY;  Surgeon: Wylene Simmer, MD;  Location: [place];  Service: Orthopedics;  Laterality: Left;  . TUBAL LIGATION     Family History  Problem Relation Age of Onset  . Pancreatic cancer Mother   . Miscarriages / [place] Mother   . Thyroid disease Mother     partial thyroidectomy ; S/P RAI  . Heart attack Father     in early 44s  . Colon cancer Father   . Colon cancer Paternal Grandmother   . Stroke Paternal Grandmother     > 65  . Heart  attack Paternal Grandfather     in 34s  . Cancer      among paternal sibs (colon, breast, bone)  . Aneurysm Paternal Uncle     brain  . Ovarian cancer Maternal Aunt   . Breast cancer Paternal Aunt   . Diabetes Neg Hx    History  Sexual Activity  . Sexual activity: Yes    Outpatient Encounter Prescriptions as of 07/29/2016  Medication Sig  . CALCIUM PO Take 770 mg by mouth.  . cetirizine (ZYRTEC) 10 MG tablet Take 10 mg by mouth daily.  . cholecalciferol (VITAMIN D) 1000 units tablet Take 1,000 Units by mouth daily.  . clotrimazole-betamethasone (LOTRISONE) cream Apply 1 application topically 2 (two) times daily. (Patient taking differently: Apply 1 application topically 2 (two) times daily as needed (rash on wrist). )  . Glucosamine HCl-MSM (MSM GLUCOSAMINE PO) Take 2 tablets by mouth daily.   . Multiple Vitamins-Minerals (CENTRUM SILVER PO) Take 1 tablet by mouth daily.  . Omega-3 Fatty Acids (FISH OIL) 1000 MG CAPS Take 1 capsule by mouth daily.   Marland Kitchen OVER THE COUNTER MEDICATION Take 1 tablet by mouth 3 (three) times daily. [employer] Bone Strength supplement  . triamcinolone (NASACORT) 55 MCG/ACT AERO nasal inhaler Place 2 sprays into the nose daily as needed (allergies).   . Zoster Vac Recomb Adjuvanted [facility]) injection Inject 0.5 mLs into the muscle once.  . [DISCONTINUED] B Complex-C (SUPER B COMPLEX PO) Take 1 tablet by mouth daily.  . [DISCONTINUED] Bepotastine Besilate 1.5 % SOLN Apply 1 drop to eye daily as needed (allergies).  . [DISCONTINUED] docusate sodium (COLACE) 100 MG capsule Take 1 capsule (100 mg total) by mouth 2 (two) times daily. While taking narcotic pain medicine.  . [DISCONTINUED] enoxaparin (LOVENOX) 40 MG/0.4ML injection Inject 0.4 mLs (40 mg total) into the skin daily.  . [DISCONTINUED] ondansetron (ZOFRAN) 4 MG tablet Take 1 tablet (4 mg total) by mouth daily as needed for nausea or vomiting.  . [DISCONTINUED] oxyCODONE (ROXICODONE) 5 MG immediate release tablet  Take 1-2 tablets (5-10 mg total) by mouth every 4 (four) hours as needed for moderate pain or severe pain.  . [DISCONTINUED] senna (SENOKOT) 8.6 MG TABS tablet Take 2 tablets (17.2 mg total) by mouth 2 (two) times daily.   No facility-administered encounter medications on file as of 07/29/2016.     Activities of Daily Living In your present state of health, do you have any difficulty performing the following activities: 07/29/2016 04/22/2016  Hearing? N N  Vision? N N  Difficulty concentrating or making decisions? N N  Walking or climbing stairs? N N  Dressing or bathing? N N  Doing errands,  shopping? N -  Preparing Food and eating ? N -  Using the Toilet? N -  In the past six months, have you accidently leaked urine? N -  Do you have problems with loss of bowel control? N -  Managing your Medications? N -  Managing your Finances? N -  Housekeeping or managing your Housekeeping? N -  Some recent data might be hidden    Patient Care Team: Ann Held, DO as PCP - General (Family Medicine)    Assessment:    Physical assessment deferred to PCP.  Exercise Activities and Dietary recommendations Current Exercise Habits: Structured exercise class, Type of exercise: treadmill, Time (Minutes): 20, Frequency (Times/Week): 3, Weekly Exercise (Minutes/Week): 60, Intensity: Mild  Diet (meal preparation, eat out, water intake, caffeinated beverages, dairy products, fruits and vegetables): in general, a "healthy" diet   Breakfast: cereal, coffee, juice Lunch: yogurt and cheese stick Dinner:  Varies--meat and vegetable.   Pt states she drinks a lot of water.  Goals    . Exercise 150 minutes per week (moderate activity)          Goal is to start walking again at the y       Fall Risk Fall Risk  07/29/2016 06/30/2015 04/24/2015 08/20/2014 08/08/2012  Falls in the past year? No No No No Yes  Number falls in past yr: - - - - 1  Injury with Fall? - - - - No   Depression Screen PHQ 2/9  Scores 07/29/2016 06/30/2015 04/24/2015 08/20/2014  PHQ - 2 Score 0 0 0 0     Cognitive Function MMSE - Mini Mental State Exam 07/29/2016 04/24/2015  Not completed: - (No Data)  Orientation to time 5 -  Orientation to Place 5 -  Registration 3 -  Attention/ Calculation 5 -  Recall 3 -  Language- name 2 objects 2 -  Language- repeat 1 -  Language- follow 3 step command 3 -  Language- read & follow direction 1 -  Write a sentence 1 -  Copy design 1 -  Total score 30 -        Immunization History  Administered Date(s) Administered  . Influenza Whole 05/16/2012  . Influenza, High Dose Seasonal PF 02/21/2014, 01/19/2016  . Influenza-Unspecified 01/29/2015  . Pneumococcal Conjugate-13 01/08/2015  . Pneumococcal Polysaccharide-23 06/11/2011  . Td 01/25/2008  . Zoster 12/13/2012   Screening Tests Health Maintenance  Topic Date Due  . MAMMOGRAM  10/17/2016  . INFLUENZA VACCINE  11/24/2016  . TETANUS/TDAP  01/24/2018  . COLONOSCOPY  08/19/2022  . DEXA SCAN  Completed  . Hepatitis C Screening  Completed  . PNA vac Low Risk Adult  Completed      Plan:     Follow up with Dr.Lowne as directed.  Continue to eat heart healthy diet (full of fruits, vegetables, whole grains, lean protein, water--limit salt, fat, and sugar intake) and increase physical activity as tolerated.  Continue doing brain stimulating activities (puzzles, reading, adult coloring books, staying active) to keep memory sharp.    During the course of the visit the patient was educated and counseled about the following appropriate screening and preventive services:   Vaccines to include Pneumoccal, Influenza, Td,  HCV  Cardiovascular Disease  Colorectal cancer screening  Bone density screening  Diabetes screening  Glaucoma screening  Mammography/PAP  Nutrition counseling   Patient Instructions (the written plan) was given to the patient.   Shela Nevin, [place]  07/29/2016

## 2016-07-27 NOTE — Progress Notes (Signed)
Pre visit review using our clinic review tool, if applicable. No additional management support is needed unless otherwise documented below in the visit note. 

## 2016-07-29 ENCOUNTER — Ambulatory Visit (INDEPENDENT_AMBULATORY_CARE_PROVIDER_SITE_OTHER): Payer: Medicare Other | Admitting: Family Medicine

## 2016-07-29 ENCOUNTER — Encounter: Payer: Self-pay | Admitting: Family Medicine

## 2016-07-29 VITALS — BP 126/60 | HR 75 | Temp 98.0°F | Resp 16 | Ht 63.0 in | Wt 157.2 lb

## 2016-07-29 DIAGNOSIS — Z0000 Encounter for general adult medical examination without abnormal findings: Secondary | ICD-10-CM

## 2016-07-29 DIAGNOSIS — E785 Hyperlipidemia, unspecified: Secondary | ICD-10-CM | POA: Diagnosis not present

## 2016-07-29 DIAGNOSIS — E559 Vitamin D deficiency, unspecified: Secondary | ICD-10-CM | POA: Diagnosis not present

## 2016-07-29 DIAGNOSIS — S2020XA Contusion of thorax, unspecified, initial encounter: Secondary | ICD-10-CM

## 2016-07-29 MED ORDER — ZOSTER VAC RECOMB ADJUVANTED 50 MCG/0.5ML IM SUSR: 0.5000 mL | Freq: Once | INTRAMUSCULAR | 1 refills | Status: AC

## 2016-07-29 NOTE — Progress Notes (Addendum)
Patient ID: Kim Mclaughlin, female   DOB: [birth year], 71 y.o.   MRN: [id number]   Subjective:     DANINE HOR is a 71 y.o. female and is here for a comprehensive physical exam. The patient reports no problems.  Patient has had foot surgery and polyp removed from uterus.  Polyp removed in November and foot surgery in December.    Social History   Social History  . Marital status: Married    Spouse name: N/A  . Number of children: N/A  . Years of education: N/A   Occupational History  . retired    Social History Main Topics  . Smoking status: Former Smoker    Quit date: 04/27/1963  . Smokeless tobacco: Never Used     Comment: Smoked < 6 mos;< 1 pack total;quit 1965  . Alcohol use No  . Drug use: No  . Sexual activity: Yes   Other Topics Concern  . Not on file   Social History Narrative   Exercise-  Walks on Mondays and Wednesdays on the treadmill      Health Maintenance  Topic Date Due  . MAMMOGRAM  10/17/2016  . INFLUENZA VACCINE  11/24/2016  . TETANUS/TDAP  01/24/2018  . COLONOSCOPY  08/19/2022  . DEXA SCAN  Completed  . Hepatitis C Screening  Completed  . PNA vac Low Risk Adult  Completed    The following portions of the patient's history were reviewed and updated as appropriate:  She  has a past medical history of Arthritis (07/2014); DVT (deep venous thrombosis) ([place]) (2015); GERD (gastroesophageal reflux disease); Hepatitis; Hyperlipidemia; MVP (mitral valve prolapse); Osteopenia; PONV (postoperative nausea and vomiting); Precancerous lesion (12/2014); and Varicose veins. She  does not have any pertinent problems on file. She  has a past surgical history that includes Appendectomy; Tubal ligation; Stapedectomy; G 3 P 3; Dilation and curettage of uterus; colonoscopy with polypectomy (07/2012); Endovenous ablation saphenous vein w/ laser (Left, 01-31-2014); Endovenous ablation saphenous vein w/ laser (Right, 03-14-2014); Dilation and curettage of uterus (N/A,  09/03/2014); Hysteroscopy w/D&C (N/A, 03/25/2016); Foot arthrodesis (Left, 04/22/2016); Bunionectomy with hammertoe reconstruction (Left, 04/22/2016); and Tarsal metatarsal fusion with weil osteotomy (Left, 04/22/2016). Her family history includes Aneurysm in her paternal uncle; Breast cancer in her paternal aunt; Colon cancer in her father and paternal grandmother; Heart attack in her father and paternal grandfather; Miscarriages / [place] in her mother; Ovarian cancer in her maternal aunt; Pancreatic cancer in her mother; Stroke in her paternal grandmother; Thyroid disease in her mother. She  reports that she quit smoking about 53 years ago. She has never used smokeless tobacco. She reports that she does not drink alcohol or use drugs. She has a current medication list which includes the following prescription(s): calcium, cetirizine, cholecalciferol, clotrimazole-betamethasone, glucosamine hcl-msm, multiple vitamins-minerals, fish oil, OVER THE COUNTER MEDICATION, triamcinolone, and zoster vac recomb adjuvanted. Current Outpatient Prescriptions on File Prior to Visit  Medication Sig Dispense Refill  . CALCIUM PO Take 770 mg by mouth.    . cetirizine (ZYRTEC) 10 MG tablet Take 10 mg by mouth daily.    . cholecalciferol (VITAMIN D) 1000 units tablet Take 1,000 Units by mouth daily.    . clotrimazole-betamethasone (LOTRISONE) cream Apply 1 application topically 2 (two) times daily. (Patient taking differently: Apply 1 application topically 2 (two) times daily as needed (rash on wrist). ) 30 g 0  . Glucosamine HCl-MSM (MSM GLUCOSAMINE PO) Take 2 tablets by mouth daily.     . Multiple  Vitamins-Minerals (CENTRUM SILVER PO) Take 1 tablet by mouth daily.    . Omega-3 Fatty Acids (FISH OIL) 1000 MG CAPS Take 1 capsule by mouth daily.     Marland Kitchen OVER THE COUNTER MEDICATION Take 1 tablet by mouth 3 (three) times daily. [employer] Bone Strength supplement    . triamcinolone (NASACORT) 55 MCG/ACT AERO nasal inhaler Place  2 sprays into the nose daily as needed (allergies).      No current facility-administered medications on file prior to visit.    She is allergic to pravastatin sodium; amoxicillin-pot clavulanate; augmentin [amoxicillin-pot clavulanate]; codeine; methylphenidate hcl; and sulfamethoxazole-trimethoprim..  Review of Systems Review of Systems  Constitutional: Negative for activity change, appetite change and fatigue.  HENT: Negative for hearing loss, congestion, tinnitus and ear discharge.  dentist q57m Eyes: Negative for visual disturbance (see optho q1y -- vision corrected to 20/20 with glasses).  Respiratory: Negative for cough, chest tightness and shortness of breath.   Cardiovascular: Negative for chest pain, palpitations and leg swelling.  Gastrointestinal: Negative for abdominal pain, diarrhea, constipation and abdominal distention.  Genitourinary: Negative for urgency, frequency, decreased urine volume and difficulty urinating.  Musculoskeletal: Negative for back pain, arthralgias and gait problem.  Skin: Negative for color change, pallor and rash.   Has bruise on back. Neurological: Negative for dizziness, light-headedness, numbness and headaches.  Hematological: Negative for adenopathy. Does not bruise/bleed easily.  Psychiatric/Behavioral: Negative for suicidal ideas, confusion, sleep disturbance, self-injury, dysphoric mood, decreased concentration and agitation.       Objective:    BP 126/60 (BP Location: Left Arm, Cuff Size: Normal)   Pulse 75   Temp 98 F (36.7 C) (Oral)   Resp 16   Ht 5\' 3"  (1.6 m)   Wt 157 lb 3.2 oz (71.3 kg)   SpO2 97%   BMI 27.85 kg/m  General appearance: alert, cooperative, appears stated age and no distress Head: Normocephalic, without obvious abnormality, atraumatic Eyes: conjunctivae/corneas clear. PERRL, EOM's intact. Fundi benign. Ears: normal TM's and external ear canals both ears Nose: Nares normal. Septum midline. Mucosa normal. No  drainage or sinus tenderness. Throat: lips, mucosa, and tongue normal; teeth and gums normal Neck: no adenopathy, no carotid bruit, no JVD, supple, symmetrical, trachea midline and thyroid not enlarged, symmetric, no tenderness/mass/nodules Back: symmetric, no curvature. ROM normal. No CVA tenderness. Lungs: clear to auscultation bilaterally Breasts: gyn Heart: regular rate and rhythm, S1, S2 normal, no murmur, click, rub or gallop Abdomen: soft, non-tender; bowel sounds normal; no masses,  no organomegaly Pelvic: deferred Extremities: extremities normal, atraumatic, no cyanosis or edema Pulses: 2+ and symmetric Skin: Skin color, texture, turgor normal. No rashes or lesions  + strange bruise on back  Lymph nodes: Cervical, supraclavicular, and axillary nodes normal. Neurologic: Alert and oriented X 3, normal strength and tone. Normal symmetric reflexes. Normal coordination and gait    Assessment:    Healthy female exam.      Plan:    ghm utd Check labs See After Visit Summary for Counseling Recommendations    1. Preventative health care See above - POCT Urinalysis Dipstick (Automated)  2. Hyperlipidemia LDL goal <100 Check labs  - Comprehensive metabolic panel - NMR, lipoprofile  3. Vitamin D deficiency Check labs - Comprehensive metabolic panel - VITAMIN D 25 Hydroxy (Vit-D Deficiency, Fractures)  4. Contusion of trunk, initial encounter   - CBC with Differential/Platelet - PTT

## 2016-07-29 NOTE — Progress Notes (Signed)
Reviewed  Yvonne R Lowne Chase, DO  

## 2016-07-29 NOTE — Patient Instructions (Addendum)
Follow up with Dr.Lowne as directed.  Continue to eat heart healthy diet (full of fruits, vegetables, whole grains, lean protein, water--limit salt, fat, and sugar intake) and increase physical activity as tolerated.  Continue doing brain stimulating activities (puzzles, reading, adult coloring books, staying active) to keep memory sharp.   Preventive Care 71 Years and Older, Female Preventive care refers to lifestyle choices and visits with your health care provider that can promote health and wellness. What does preventive care include?  A yearly physical exam. This is also called an annual well check.  Dental exams once or twice a year.  Routine eye exams. Ask your health care provider how often you should have your eyes checked.  Personal lifestyle choices, including:  Daily care of your teeth and gums.  Regular physical activity.  Eating a healthy diet.  Avoiding tobacco and drug use.  Limiting alcohol use.  Practicing safe sex.  Taking low-dose aspirin every day.  Taking vitamin and mineral supplements as recommended by your health care provider. What happens during an annual well check? The services and screenings done by your health care provider during your annual well check will depend on your age, overall health, lifestyle risk factors, and family history of disease. Counseling  Your health care provider may ask you questions about your:  Alcohol use.  Tobacco use.  Drug use.  Emotional well-being.  Home and relationship well-being.  Sexual activity.  Eating habits.  History of falls.  Memory and ability to understand (cognition).  Work and work Statistician.  Reproductive health. Screening  You may have the following tests or measurements:  Height, weight, and BMI.  Blood pressure.  Lipid and cholesterol levels. These may be checked every 5 years, or more frequently if you are over 9 years old.  Skin check.  Lung cancer screening. You  may have this screening every year starting at age 17 if you have a 30-pack-year history of smoking and currently smoke or have quit within the past 15 years.  Fecal occult blood test (FOBT) of the stool. You may have this test every year starting at age 83.  Flexible sigmoidoscopy or colonoscopy. You may have a sigmoidoscopy every 5 years or a colonoscopy every 10 years starting at age 19.  Hepatitis C blood test.  Hepatitis B blood test.  Sexually transmitted disease (STD) testing.  Diabetes screening. This is done by checking your blood sugar (glucose) after you have not eaten for a while (fasting). You may have this done every 1-3 years.  Bone density scan. This is done to screen for osteoporosis. You may have this done starting at age 31.  Mammogram. This may be done every 1-2 years. Talk to your health care provider about how often you should have regular mammograms. Talk with your health care provider about your test results, treatment options, and if necessary, the need for more tests. Vaccines  Your health care provider may recommend certain vaccines, such as:  Influenza vaccine. This is recommended every year.  Tetanus, diphtheria, and acellular pertussis (Tdap, Td) vaccine. You may need a Td booster every 10 years.  Varicella vaccine. You may need this if you have not been vaccinated.  Zoster vaccine. You may need this after age 62.  Measles, mumps, and rubella (MMR) vaccine. You may need at least one dose of MMR if you were born in 1957 or later. You may also need a second dose.  Pneumococcal 13-valent conjugate (PCV13) vaccine. One dose is recommended after age  65.  Pneumococcal polysaccharide (PPSV23) vaccine. One dose is recommended after age 62.  Meningococcal vaccine. You may need this if you have certain conditions.  Hepatitis A vaccine. You may need this if you have certain conditions or if you travel or work in places where you may be exposed to hepatitis  A.  Hepatitis B vaccine. You may need this if you have certain conditions or if you travel or work in places where you may be exposed to hepatitis B.  Haemophilus influenzae type b (Hib) vaccine. You may need this if you have certain conditions. Talk to your health care provider about which screenings and vaccines you need and how often you need them. This information is not intended to replace advice given to you by your health care provider. Make sure you discuss any questions you have with your health care provider. Document Released: 05/09/2015 Document Revised: 12/31/2015 Document Reviewed: 02/11/2015 Elsevier Interactive Patient Education  2017 [employer].

## 2016-07-30 LAB — COMPREHENSIVE METABOLIC PANEL
ALBUMIN: 4.1 g/dL (ref 3.5–5.2)
ALT: 19 U/L (ref 0–35)
AST: 22 U/L (ref 0–37)
Alkaline Phosphatase: 79 U/L (ref 39–117)
BILIRUBIN TOTAL: 0.4 mg/dL (ref 0.2–1.2)
BUN: 16 mg/dL (ref 6–23)
CO2: 30 mEq/L (ref 19–32)
Calcium: 10.2 mg/dL (ref 8.4–10.5)
Chloride: 104 mEq/L (ref 96–112)
Creatinine, Ser: 0.99 mg/dL (ref 0.40–1.20)
GFR: 58.73 mL/min — AB (ref >60.00)
Glucose, Bld: 83 mg/dL (ref 70–99)
POTASSIUM: 4.3 meq/L (ref 3.5–5.1)
Sodium: 142 mEq/L (ref 135–145)
TOTAL PROTEIN: 7.1 g/dL (ref 6.0–8.3)

## 2016-07-30 LAB — CBC WITH DIFFERENTIAL/PLATELET
Basophils Absolute: 0.1 10*3/uL (ref 0.0–0.1)
Basophils Relative: 1 % (ref 0.0–3.0)
EOS PCT: 4.6 % (ref 0.0–5.0)
Eosinophils Absolute: 0.3 10*3/uL (ref 0.0–0.7)
HCT: 40.8 % (ref 36.0–46.0)
HEMOGLOBIN: 13.7 g/dL (ref 12.0–15.0)
LYMPHS PCT: 32 % (ref 12.0–46.0)
Lymphs Abs: 2.1 10*3/uL (ref 0.7–4.0)
MCHC: 33.6 g/dL (ref 30.0–36.0)
MCV: 93.2 fl (ref 78.0–100.0)
MONOS PCT: 8.7 % (ref 3.0–12.0)
Monocytes Absolute: 0.6 10*3/uL (ref 0.1–1.0)
Neutro Abs: 3.5 10*3/uL (ref 1.4–7.7)
Neutrophils Relative %: 53.7 % (ref 43.0–77.0)
Platelets: 312 10*3/uL (ref 150.0–400.0)
RBC: 4.38 Mil/uL (ref 3.87–5.11)
RDW: 13.6 % (ref 11.5–15.5)
WBC: 6.6 10*3/uL (ref 4.0–10.5)

## 2016-07-30 LAB — NMR, LIPOPROFILE
Cholesterol: 237 mg/dL — ABNORMAL HIGH (ref 100–199)
HDL CHOLESTEROL BY NMR: 73 mg/dL (ref >39)
HDL PARTICLE NUMBER: 37.3 umol/L (ref >=30.5)
LDL Particle Number: 1066 nmol/L — ABNORMAL HIGH (ref <1000)
LDL Size: 21.9 nm (ref >20.5)
LDL-C: 138 mg/dL — ABNORMAL HIGH (ref 0–99)
LP-IR Score: <25 (ref <=45)
Triglycerides by NMR: 128 mg/dL (ref 0–149)

## 2016-07-30 LAB — VITAMIN D 25 HYDROXY (VIT D DEFICIENCY, FRACTURES): VITD: 56.05 ng/mL (ref 30.00–100.00)

## 2016-07-30 LAB — APTT: APTT: 27.3 s (ref 23.4–32.7)

## 2016-08-02 ENCOUNTER — Other Ambulatory Visit: Payer: Self-pay | Admitting: Family Medicine

## 2016-08-02 DIAGNOSIS — E785 Hyperlipidemia, unspecified: Secondary | ICD-10-CM

## 2016-08-09 ENCOUNTER — Ambulatory Visit (INDEPENDENT_AMBULATORY_CARE_PROVIDER_SITE_OTHER): Payer: Medicare Other | Admitting: Family Medicine

## 2016-08-09 ENCOUNTER — Encounter: Payer: Self-pay | Admitting: Family Medicine

## 2016-08-09 VITALS — BP 120/70 | HR 82 | Temp 97.7°F | Ht 63.0 in | Wt 153.8 lb

## 2016-08-09 DIAGNOSIS — N3001 Acute cystitis with hematuria: Secondary | ICD-10-CM | POA: Diagnosis not present

## 2016-08-09 LAB — POC URINALSYSI DIPSTICK (AUTOMATED)
Bilirubin, UA: NEGATIVE
GLUCOSE UA: NEGATIVE
KETONES UA: NEGATIVE
Nitrite, UA: NEGATIVE
UROBILINOGEN UA: 0.2 U/dL
pH, UA: 6 (ref 5.0–8.0)

## 2016-08-09 MED ORDER — CEPHALEXIN 500 MG PO CAPS: 500.0000 mg | ORAL_CAPSULE | Freq: Three times a day (TID) | ORAL | 0 refills | Status: AC

## 2016-08-09 NOTE — Patient Instructions (Signed)
Fevers, flank pain or worsening symptoms, seek immediate care.

## 2016-08-09 NOTE — Progress Notes (Signed)
Chief Complaint  Patient presents with  . Urinary Tract Infection    urgency,odor,cloudy-sxs noticed this am    Kim Mclaughlin is a 71 y.o. female here for possible UTI.  Duration: 1 day. Symptoms: urinary frequency, urinary hesitancy, urinary retention and dysyuri Denies: fever, nausea, vomiting and flank pain, vaginal discharge Hx of recurrent UTI? No Denies new sexual partners.  ROS:  Constitutional: denies fever GU: As noted in HPI MSK: Denies back pain Abd: Denies constipation or abdominal pain  Past Medical History:  Diagnosis Date  . Arthritis 07/2014   Right hand, prescribed prednisone-no longer taking. also Rt knww  . DVT (deep venous thrombosis) ([place]) 2015   very small; treated with compression and heat  . GERD (gastroesophageal reflux disease)    patient denies states she does have hx of peptic ulcer many years  . Hepatitis    in 10 th grade  (not sure type- not hep C)  . Hyperlipidemia   . MVP (mitral valve prolapse)   . Osteopenia    [facility]  . PONV (postoperative nausea and vomiting)    with ear surgery only  . Precancerous lesion 12/2014   Treated with cream only  . Varicose veins    Family History  Problem Relation Age of Onset  . Pancreatic cancer Mother   . Miscarriages / [place] Mother   . Thyroid disease Mother     partial thyroidectomy ; S/P RAI  . Heart attack Father     in early 63s  . Colon cancer Father   . Colon cancer Paternal Grandmother   . Stroke Paternal Grandmother     > 65  . Heart attack Paternal Grandfather     in 97s  . Cancer      among paternal sibs (colon, breast, bone)  . Aneurysm Paternal Uncle     brain  . Ovarian cancer Maternal Aunt   . Breast cancer Paternal Aunt   . Diabetes Neg Hx    Social History   Social History  . Marital status: Married   Occupational History  . retired    Social History Main Topics  . Smoking status: Former Smoker    Quit date: 04/27/1963  . Smokeless tobacco: Never Used     Comment: Smoked < 6 mos;< 1 pack total;quit 1965  . Alcohol use No  . Drug use: No  . Sexual activity: Yes   Social History Narrative   Exercise-  Walks on Mondays and Wednesdays on the treadmill       BP 120/70 (BP Location: Left Arm, Patient Position: Sitting, Cuff Size: Normal)   Pulse 82   Temp 97.7 F (36.5 C) (Oral)   Ht 5\' 3"  (1.6 m)   Wt 153 lb 12.8 oz (69.8 kg)   SpO2 97%   BMI 27.24 kg/m  General: Awake, alert, appears stated age HEENT: MMM Heart: RRR, no murmurs Lungs: CTAB, normal respiratory effort, no accessory muscle usage Abd: BS+, soft, NT, ND, no masses or organomegaly MSK: No CVA tenderness, neg Lloyd's sign Psych: Age appropriate judgment and insight  Acute cystitis with hematuria - Plan: POCT Urinalysis Dipstick (Automated), cephALEXin (KEFLEX) 500 MG capsule  Orders as above. Side effects to Bactrim, did not tolerate Macrobid well in past.  Culture.  Seek immediate care if starting to have fevers, flank pain or worsening symptoms. F/u prn. The patient voiced understanding and agreement to the plan.  [place], DO 08/09/16 8:59 AM

## 2016-08-09 NOTE — Progress Notes (Signed)
Pre visit review using our clinic review tool, if applicable. No additional management support is needed unless otherwise documented below in the visit note. 

## 2016-08-09 NOTE — Addendum Note (Signed)
Addended by: Harl Bowie on: 08/09/2016 09:59 AM   Modules accepted: Orders

## 2016-08-12 LAB — URINE CULTURE

## 2016-08-23 ENCOUNTER — Encounter: Payer: Self-pay | Admitting: Family Medicine

## 2016-08-23 NOTE — Telephone Encounter (Signed)
error:[id number] ° °

## 2016-08-24 ENCOUNTER — Ambulatory Visit: Payer: Medicare Other | Admitting: Family Medicine

## 2016-12-29 ENCOUNTER — Other Ambulatory Visit: Payer: Medicare Other

## 2017-06-08 ENCOUNTER — Telehealth: Payer: Self-pay | Admitting: *Deleted

## 2017-06-08 NOTE — Telephone Encounter (Signed)
Received Physician Orders from [facility]; forwarded to provider/SLS 02/13

## 2017-06-23 ENCOUNTER — Encounter: Payer: Self-pay | Admitting: Family Medicine

## 2017-06-23 LAB — HM DEXA SCAN

## 2017-06-28 NOTE — Telephone Encounter (Signed)
Received DEXA Report results from [facility]; forwarded to provider/SLS 03/05

## 2017-06-30 ENCOUNTER — Encounter: Payer: Self-pay | Admitting: *Deleted

## 2017-07-16 ENCOUNTER — Encounter: Payer: Self-pay | Admitting: Family Medicine

## 2017-08-05 ENCOUNTER — Encounter: Payer: Self-pay | Admitting: Family Medicine

## 2017-08-05 ENCOUNTER — Ambulatory Visit (INDEPENDENT_AMBULATORY_CARE_PROVIDER_SITE_OTHER): Payer: Medicare Other | Admitting: Family Medicine

## 2017-08-05 VITALS — BP 154/72 | HR 75 | Resp 16 | Ht 63.0 in | Wt 140.0 lb

## 2017-08-05 DIAGNOSIS — Z136 Encounter for screening for cardiovascular disorders: Secondary | ICD-10-CM

## 2017-08-05 DIAGNOSIS — Z0000 Encounter for general adult medical examination without abnormal findings: Secondary | ICD-10-CM

## 2017-08-05 DIAGNOSIS — F321 Major depressive disorder, single episode, moderate: Secondary | ICD-10-CM | POA: Diagnosis not present

## 2017-08-05 LAB — CBC WITH DIFFERENTIAL/PLATELET
BASOS ABS: 52 {cells}/uL (ref 0–200)
Basophils Relative: 0.7 %
EOS ABS: 222 {cells}/uL (ref 15–500)
EOS PCT: 3 %
HCT: 42 % (ref 35.0–45.0)
Hemoglobin: 14.6 g/dL (ref 11.7–15.5)
Lymphs Abs: 2768 cells/uL (ref 850–3900)
MCH: 31.4 pg (ref 27.0–33.0)
MCHC: 34.8 g/dL (ref 32.0–36.0)
MCV: 90.3 fL (ref 80.0–100.0)
MONOS PCT: 6.5 %
MPV: 9.8 fL (ref 7.5–12.5)
Neutro Abs: 3878 cells/uL (ref 1500–7800)
Neutrophils Relative %: 52.4 %
PLATELETS: 389 10*3/uL (ref 140–400)
RBC: 4.65 10*6/uL (ref 3.80–5.10)
RDW: 11.7 % (ref 11.0–15.0)
TOTAL LYMPHOCYTE: 37.4 %
WBC mixed population: 481 cells/uL (ref 200–950)
WBC: 7.4 10*3/uL (ref 3.8–10.8)

## 2017-08-05 LAB — COMPREHENSIVE METABOLIC PANEL
AG RATIO: 1.5 (calc) (ref 1.0–2.5)
ALT: 14 U/L (ref 6–29)
AST: 19 U/L (ref 10–35)
Albumin: 4.4 g/dL (ref 3.6–5.1)
Alkaline phosphatase (APISO): 72 U/L (ref 33–130)
BILIRUBIN TOTAL: 0.5 mg/dL (ref 0.2–1.2)
BUN/Creatinine Ratio: 13 (calc) (ref 6–22)
BUN: 12 mg/dL (ref 7–25)
CALCIUM: 10.3 mg/dL (ref 8.6–10.4)
CHLORIDE: 103 mmol/L (ref 98–110)
CO2: 27 mmol/L (ref 20–32)
Creat: 0.94 mg/dL — ABNORMAL HIGH (ref 0.60–0.93)
GLOBULIN: 3 g/dL (ref 1.9–3.7)
GLUCOSE: 106 mg/dL — AB (ref 65–99)
Potassium: 5 mmol/L (ref 3.5–5.3)
SODIUM: 142 mmol/L (ref 135–146)
TOTAL PROTEIN: 7.4 g/dL (ref 6.1–8.1)

## 2017-08-05 LAB — LIPID PANEL
CHOLESTEROL: 247 mg/dL — AB (ref <200)
HDL: 79 mg/dL (ref >50)
LDL Cholesterol (Calc): 149 mg/dL (calc) — ABNORMAL HIGH
Non-HDL Cholesterol (Calc): 168 mg/dL (calc) — ABNORMAL HIGH (ref <130)
TRIGLYCERIDES: 84 mg/dL (ref <150)
Total CHOL/HDL Ratio: 3.1 (calc) (ref <5.0)

## 2017-08-05 NOTE — Patient Instructions (Signed)
Preventive Care 72 Years and Older, Female Preventive care refers to lifestyle choices and visits with your health care provider that can promote health and wellness. What does preventive care include?  A yearly physical exam. This is also called an annual well check.  Dental exams once or twice a year.  Routine eye exams. Ask your health care provider how often you should have your eyes checked.  Personal lifestyle choices, including: ? Daily care of your teeth and gums. ? Regular physical activity. ? Eating a healthy diet. ? Avoiding tobacco and drug use. ? Limiting alcohol use. ? Practicing safe sex. ? Taking low-dose aspirin every day. ? Taking vitamin and mineral supplements as recommended by your health care provider. What happens during an annual well check? The services and screenings done by your health care provider during your annual well check will depend on your age, overall health, lifestyle risk factors, and family history of disease. Counseling Your health care provider may ask you questions about your:  Alcohol use.  Tobacco use.  Drug use.  Emotional well-being.  Home and relationship well-being.  Sexual activity.  Eating habits.  History of falls.  Memory and ability to understand (cognition).  Work and work environment.  Reproductive health.  Screening You may have the following tests or measurements:  Height, weight, and BMI.  Blood pressure.  Lipid and cholesterol levels. These may be checked every 5 years, or more frequently if you are over 50 years old.  Skin check.  Lung cancer screening. You may have this screening every year starting at age 55 if you have a 30-pack-year history of smoking and currently smoke or have quit within the past 15 years.  Fecal occult blood test (FOBT) of the stool. You may have this test every year starting at age 50.  Flexible sigmoidoscopy or colonoscopy. You may have a sigmoidoscopy every 5 years or  a colonoscopy every 10 years starting at age 50.  Hepatitis C blood test.  Hepatitis B blood test.  Sexually transmitted disease (STD) testing.  Diabetes screening. This is done by checking your blood sugar (glucose) after you have not eaten for a while (fasting). You may have this done every 1-3 years.  Bone density scan. This is done to screen for osteoporosis. You may have this done starting at age 65.  Mammogram. This may be done every 1-2 years. Talk to your health care provider about how often you should have regular mammograms.  Talk with your health care provider about your test results, treatment options, and if necessary, the need for more tests. Vaccines Your health care provider may recommend certain vaccines, such as:  Influenza vaccine. This is recommended every year.  Tetanus, diphtheria, and acellular pertussis (Tdap, Td) vaccine. You may need a Td booster every 10 years.  Varicella vaccine. You may need this if you have not been vaccinated.  Zoster vaccine. You may need this after age 60.  Measles, mumps, and rubella (MMR) vaccine. You may need at least one dose of MMR if you were born in 1957 or later. You may also need a second dose.  Pneumococcal 13-valent conjugate (PCV13) vaccine. One dose is recommended after age 65.  Pneumococcal polysaccharide (PPSV23) vaccine. One dose is recommended after age 65.  Meningococcal vaccine. You may need this if you have certain conditions.  Hepatitis A vaccine. You may need this if you have certain conditions or if you travel or work in places where you may be exposed to hepatitis   A.  Hepatitis B vaccine. You may need this if you have certain conditions or if you travel or work in places where you may be exposed to hepatitis B.  Haemophilus influenzae type b (Hib) vaccine. You may need this if you have certain conditions.  Talk to your health care provider about which screenings and vaccines you need and how often you  need them. This information is not intended to replace advice given to you by your health care provider. Make sure you discuss any questions you have with your health care provider. Document Released: 05/09/2015 Document Revised: 12/31/2015 Document Reviewed: 02/11/2015 Elsevier Interactive Patient Education  2018 Elsevier Inc.  

## 2017-08-05 NOTE — Progress Notes (Signed)
Subjective:     Kim Mclaughlin is a 72 y.o. female and is here for a comprehensive physical exam. The patient reports problems - she thinks her husband is cheating on her.  we had a long discussion about this -- she is seeing a Social worker.  .  Social History   Socioeconomic History  . Marital status: Married    Spouse name: Not on file  . Number of children: Not on file  . Years of education: Not on file  . Highest education level: Not on file  Occupational History  . Occupation: retired  Scientific laboratory technician  . Financial resource strain: Not on file  . Food insecurity:    Worry: Not on file    Inability: Not on file  . Transportation needs:    Medical: Not on file    Non-medical: Not on file  Tobacco Use  . Smoking status: Former Smoker    Last attempt to quit: 04/27/1963    Years since quitting: 54.3  . Smokeless tobacco: Never Used  . Tobacco comment: Smoked < 6 mos;< 1 pack total;quit 1965  Substance and Sexual Activity  . Alcohol use: No  . Drug use: No  . Sexual activity: Yes  Lifestyle  . Physical activity:    Days per week: Not on file    Minutes per session: Not on file  . Stress: Not on file  Relationships  . Social connections:    Talks on phone: Not on file    Gets together: Not on file    Attends religious service: Not on file    Active member of club or organization: Not on file    Attends meetings of clubs or organizations: Not on file    Relationship status: Not on file  . Intimate partner violence:    Fear of current or ex partner: Not on file    Emotionally abused: Not on file    Physically abused: Not on file    Forced sexual activity: Not on file  Other Topics Concern  . Not on file  Social History Narrative   Exercise-  Walks on Mondays and Wednesdays on the treadmill   Health Maintenance  Topic Date Due  . MAMMOGRAM  10/17/2016  . DEXA SCAN  06/19/2017  . INFLUENZA VACCINE  11/24/2017  . TETANUS/TDAP  01/24/2018  . COLONOSCOPY  08/19/2022   . Hepatitis C Screening  Completed  . PNA vac Low Risk Adult  Completed    The following portions of the patient's history were reviewed and updated as appropriate:  She  has a past medical history of Arthritis (07/2014), DVT (deep venous thrombosis) ([place]) (2015), GERD (gastroesophageal reflux disease), Hepatitis, Hyperlipidemia, MVP (mitral valve prolapse), Osteopenia, PONV (postoperative nausea and vomiting), Precancerous lesion (12/2014), and Varicose veins. She does not have any pertinent problems on file. She  has a past surgical history that includes Appendectomy; Tubal ligation; Stapedectomy; G 3 P 3; Dilation and curettage of uterus; colonoscopy with polypectomy (07/2012); Endovenous ablation saphenous vein w/ laser (Left, 01-31-2014); Endovenous ablation saphenous vein w/ laser (Right, 03-14-2014); Dilation and curettage of uterus (N/A, 09/03/2014); Hysteroscopy w/D&C (N/A, 03/25/2016); Foot arthrodesis (Left, 04/22/2016); Bunionectomy with hammertoe reconstruction (Left, 04/22/2016); and Tarsal metatarsal fusion with weil osteotomy (Left, 04/22/2016). Her family history includes Aneurysm in her paternal uncle; Breast cancer in her paternal aunt; Cancer in her unknown relative; Colon cancer in her father and paternal grandmother; Heart attack in her father and paternal grandfather; Miscarriages / [place] in her  mother; Ovarian cancer in her maternal aunt; Pancreatic cancer in her mother; Stroke in her paternal grandmother; Thyroid disease in her mother. She  reports that she quit smoking about 54 years ago. She has never used smokeless tobacco. She reports that she does not drink alcohol or use drugs. She has a current medication list which includes the following prescription(s): aspirin ec, biotin, calcium, cetirizine, cholecalciferol, clotrimazole-betamethasone, diclofenac sodium, glucosamine hcl-msm, multiple vitamins-minerals, fish oil, OVER THE COUNTER MEDICATION, triamcinolone, and  triamcinolone cream. Current Outpatient Medications on File Prior to Visit  Medication Sig Dispense Refill  . aspirin EC 81 MG tablet Take 81 mg by mouth daily.    . Biotin 2500 MCG CAPS Take by mouth.    Marland Kitchen CALCIUM PO Take 770 mg by mouth.    . cetirizine (ZYRTEC) 10 MG tablet Take 10 mg by mouth daily.    . cholecalciferol (VITAMIN D) 1000 units tablet Take 1,000 Units by mouth daily.    . clotrimazole-betamethasone (LOTRISONE) cream Apply 1 application topically 2 (two) times daily. (Patient taking differently: Apply 1 application topically 2 (two) times daily as needed (rash on wrist). ) 30 g 0  . diclofenac sodium (VOLTAREN) 1 % GEL Apply 2 g topically as needed.    . Glucosamine HCl-MSM (MSM GLUCOSAMINE PO) Take 2 tablets by mouth daily.     . Multiple Vitamins-Minerals (CENTRUM SILVER PO) Take 1 tablet by mouth daily.    . Omega-3 Fatty Acids (FISH OIL) 1000 MG CAPS Take 1 capsule by mouth daily.     Marland Kitchen OVER THE COUNTER MEDICATION Take 1 tablet by mouth 3 (three) times daily. [employer] Bone Strength supplement    . triamcinolone (NASACORT) 55 MCG/ACT AERO nasal inhaler Place 2 sprays into the nose daily as needed (allergies).     . triamcinolone cream (KENALOG) 0.1 % triamcinolone acetonide 0.1 % topical cream     No current facility-administered medications on file prior to visit.    She is allergic to macrobid  [nitrofurantoin macrocrystal]; doxycycline; pravastatin sodium; amoxicillin-pot clavulanate; augmentin [amoxicillin-pot clavulanate]; codeine; methylphenidate hcl; and sulfamethoxazole-trimethoprim..  Review of Systems Review of Systems  Constitutional: Negative for activity change, appetite change and fatigue.  HENT: Negative for hearing loss, congestion, tinnitus and ear discharge.  dentist q50mEyes: Negative for visual disturbance (see optho q1y -- vision corrected to 20/20 with glasses).  Respiratory: Negative for cough, chest tightness and shortness of breath.    Cardiovascular: Negative for chest pain, palpitations and leg swelling.  Gastrointestinal: Negative for abdominal pain, diarrhea, constipation and abdominal distention.  Genitourinary: Negative for urgency, frequency, decreased urine volume and difficulty urinating.  Musculoskeletal: Negative for back pain, arthralgias and gait problem.  Skin: Negative for color change, pallor and rash.  Neurological: Negative for dizziness, light-headedness, numbness and headaches.  Hematological: Negative for adenopathy. Does not bruise/bleed easily.  Psychiatric/Behavioral: Negative for suicidal ideas, confusion, , self-injury, , decreased concentration and agitation.     Objective:    BP (!) 154/72 (BP Location: Left Arm, Patient Position: Sitting, Cuff Size: Normal)   Pulse 75   Resp 16   Ht 5' 3"  (1.6 m)   Wt 140 lb (63.5 kg)   SpO2 97%   BMI 24.80 kg/m  General appearance: alert, cooperative, appears stated age and no distress Head: Normocephalic, without obvious abnormality, atraumatic Eyes: conjunctivae/corneas clear. PERRL, EOM's intact. Fundi benign. Ears: normal TM's and external ear canals both ears Nose: Nares normal. Septum midline. Mucosa normal. No drainage or sinus tenderness. Throat:  lips, mucosa, and tongue normal; teeth and gums normal Neck: no adenopathy, no carotid bruit, no JVD, supple, symmetrical, trachea midline and thyroid not enlarged, symmetric, no tenderness/mass/nodules Back: symmetric, no curvature. ROM normal. No CVA tenderness. Lungs: clear to auscultation bilaterally Breasts: gyn Heart: regular rate and rhythm, S1, S2 normal, no murmur, click, rub or gallop Abdomen: soft, non-tender; bowel sounds normal; no masses,  no organomegaly Pelvic: deferred---gyn Extremities: extremities normal, atraumatic, no cyanosis or edema Pulses: 2+ and symmetric Skin: Skin color, texture, turgor normal. No rashes or lesions Lymph nodes: Cervical, supraclavicular, and axillary  nodes normal. Neurologic: Alert and oriented X 3, normal strength and tone. Normal symmetric reflexes. Normal coordination and gait    Assessment:    Healthy female exam.      Plan:    ghm utd Check labs See AVS See After Visit Summary for Counseling Recommendations    1. Preventative health care See above  2. Ischemic heart disease screen  - Lipid Profile - Comp Met (CMET) - CBC with Differential/Platelet

## 2017-08-05 NOTE — Assessment & Plan Note (Addendum)
Pt seeing counselor  rto prn Pt in office >45 min with > 50% time face to face discussing depression

## 2017-08-09 ENCOUNTER — Encounter: Payer: Self-pay | Admitting: Family Medicine

## 2017-08-24 ENCOUNTER — Encounter: Payer: Self-pay | Admitting: Family Medicine

## 2017-08-26 ENCOUNTER — Encounter: Payer: Self-pay | Admitting: *Deleted

## 2017-08-29 NOTE — Telephone Encounter (Signed)
That would need to be an acute visit --- 30 min if possible

## 2017-10-13 ENCOUNTER — Encounter: Payer: Self-pay | Admitting: Family Medicine

## 2017-10-13 NOTE — Telephone Encounter (Signed)
Needs ov

## 2017-11-02 NOTE — Progress Notes (Signed)
Subjective:   Kim Mclaughlin is a 72 y.o. female who presents for Medicare Annual (Subsequent) preventive examination.  Review of Systems: No ROS.  Medicare Wellness Visit. Additional risk factors are reflected in the social history. Cardiac Risk Factors include: advanced age (>64men, >55 women);dyslipidemia Sleep patterns:Sleeps about 8 hrs per night. No issues reported. Home Safety/Smoke Alarms: Feels safe in home. Smoke alarms in place.  Living environment; residence and Firearm Safety: Lives with husband in 1 story home.   Female:     Mammo- scheduled 12/25/17      Dexa scan-  utd      CCS-due 2024     Objective:     Vitals: BP 120/72 (BP Location: Left Arm, Patient Position: Sitting, Cuff Size: Normal)   Pulse 70   Ht 5\' 3"  (1.6 m)   Wt 142 lb 3.2 oz (64.5 kg)   SpO2 97%   BMI 25.19 kg/m   Body mass index is 25.19 kg/m.  Advanced Directives 11/07/2017 07/29/2016 04/22/2016 04/16/2016 03/25/2016 03/16/2016 11/27/2015  Does Patient Have a Medical Advance Directive? Yes Yes Yes Yes - Yes Yes  Type of Advance Directive [place];Living will [place];Living will [place];Living will [place];Living will [place];Living will Living will [place];Living will  Does patient want to make changes to medical advance directive? - - No - Patient declined - - No - Patient declined -  Copy of [place] in Chart? Yes No - copy requested Yes - Yes No - copy requested -  Would patient like information on creating a medical advance directive? - - - - - - -    Tobacco Social History   Tobacco Use  Smoking Status Former Smoker  . Last attempt to quit: 04/27/1963  . Years since quitting: 54.5  Smokeless Tobacco Never Used  Tobacco Comment   Smoked < 6 mos;< 1 pack total;quit 1965     Counseling given: Not Answered Comment: Smoked < 6 mos;< 1 pack  total;quit 1965   Clinical Intake: Pain : No/denies pain    Past Medical History:  Diagnosis Date  . Arthritis 07/2014   Right hand, prescribed prednisone-no longer taking. also Rt knww  . DVT (deep venous thrombosis) ([place]) 2015   very small; treated with compression and heat  . GERD (gastroesophageal reflux disease)    patient denies states she does have hx of peptic ulcer many years  . Hepatitis    in 10 th grade  (not sure type- not hep C)  . Hyperlipidemia   . MVP (mitral valve prolapse)   . Osteopenia    [facility]  . PONV (postoperative nausea and vomiting)    with ear surgery only  . Precancerous lesion 12/2014   Treated with cream only  . Varicose veins    Past Surgical History:  Procedure Laterality Date  . APPENDECTOMY    . BUNIONECTOMY WITH HAMMERTOE RECONSTRUCTION Left 04/22/2016   Procedure: MODIFIED MCBRIDE BUNIONECTOMY,  LEFT SECOND HAMMERTOE CORRECTION;  Surgeon: Wylene Simmer, MD;  Location: [facility];  Service: Orthopedics;  Laterality: Left;  . colonoscopy with polypectomy  07/2012    hyperplastic;Dr Olevia Perches  . DILATION AND CURETTAGE OF UTERUS    . DILATION AND CURETTAGE OF UTERUS N/A 09/03/2014   Procedure: DILATATION AND CURETTAGE;  Surgeon: Harle Battiest, MD;  Location: [facility];  Service: Gynecology;  Laterality: N/A;  . ENDOVENOUS ABLATION  SAPHENOUS VEIN W/ LASER Left 01-31-2014   EVLA  left greater saphenous vein  by Curt Jews MD  . ENDOVENOUS ABLATION SAPHENOUS VEIN W/ LASER Right 03-14-2014   endovenous laser ablation right greater saphenous vein  by Curt Jews MD  . FOOT ARTHRODESIS Left 04/22/2016   Procedure: LEFT FIRST TARSAL METATARSAL ARTHRODESIS;  Surgeon: Wylene Simmer, MD;  Location: [facility];  Service: Orthopedics;  Laterality: Left;  Marland Kitchen G 3 P 3    . HYSTEROSCOPY W/D&C N/A 03/25/2016   Procedure: DILATATION AND CURETTAGE /HYSTEROSCOPY;  Surgeon: Jerelyn Charles, MD;  Location: [facility];  Service:  Gynecology;  Laterality: N/A;  WITH ULTRASOUND GUIDANCE  . STAPEDECTOMY    . TARSAL METATARSAL FUSION WITH WEIL OSTEOTOMY Left 04/22/2016   Procedure: 2-4 TARSAL METATARSAL FUSION WITH WEIL OSTEOTOMY;  Surgeon: Wylene Simmer, MD;  Location: [facility];  Service: Orthopedics;  Laterality: Left;  . TUBAL LIGATION     Family History  Problem Relation Age of Onset  . Pancreatic cancer Mother   . Miscarriages / [place] Mother   . Thyroid disease Mother        partial thyroidectomy ; S/P RAI  . Heart attack Father        in early 23s  . Colon cancer Father   . Colon cancer Paternal Grandmother   . Stroke Paternal Grandmother        > 65  . Heart attack Paternal Grandfather        in 35s  . Cancer Unknown        among paternal sibs (colon, breast, bone)  . Aneurysm Paternal Uncle        brain  . Ovarian cancer Maternal Aunt   . Breast cancer Paternal Aunt   . Diabetes Neg Hx    Social History   Socioeconomic History  . Marital status: Married    Spouse name: Not on file  . Number of children: Not on file  . Years of education: Not on file  . Highest education level: Not on file  Occupational History  . Occupation: retired  Scientific laboratory technician  . Financial resource strain: Not on file  . Food insecurity:    Worry: Not on file    Inability: Not on file  . Transportation needs:    Medical: Not on file    Non-medical: Not on file  Tobacco Use  . Smoking status: Former Smoker    Last attempt to quit: 04/27/1963    Years since quitting: 54.5  . Smokeless tobacco: Never Used  . Tobacco comment: Smoked < 6 mos;< 1 pack total;quit 1965  Substance and Sexual Activity  . Alcohol use: No  . Drug use: No  . Sexual activity: Yes  Lifestyle  . Physical activity:    Days per week: Not on file    Minutes per session: Not on file  . Stress: Not on file  Relationships  . Social connections:    Talks on phone: Not on file    Gets together: Not on file    Attends  religious service: Not on file    Active member of club or organization: Not on file    Attends meetings of clubs or organizations: Not on file    Relationship status: Not on file  Other Topics Concern  . Not on file  Social History Narrative   Exercise-  Walks on Mondays and Wednesdays on the treadmill    Outpatient Encounter Medications as of 11/07/2017  Medication Sig  . aspirin EC 81 MG tablet Take 81 mg by mouth daily.  . cetirizine (ZYRTEC) 10 MG tablet Take 10 mg by mouth daily.  . cholecalciferol (VITAMIN D) 1000 units tablet Take 1,000 Units by mouth daily.  . clotrimazole-betamethasone (LOTRISONE) cream Apply 1 application topically 2 (two) times daily. (Patient taking differently: Apply 1 application topically 2 (two) times daily as needed (rash on wrist). )  . diclofenac sodium (VOLTAREN) 1 % GEL Apply 2 g topically as needed.  . Glucosamine HCl-MSM (MSM GLUCOSAMINE PO) Take 2 tablets by mouth daily.   . Multiple Vitamins-Minerals (CENTRUM SILVER PO) Take 1 tablet by mouth daily.  . Omega-3 Fatty Acids (FISH OIL) 1000 MG CAPS Take 1 capsule by mouth daily.   Marland Kitchen OVER THE COUNTER MEDICATION Take 1 tablet by mouth 3 (three) times daily. [employer] Bone Strength supplement  . triamcinolone (NASACORT) 55 MCG/ACT AERO nasal inhaler Place 2 sprays into the nose daily as needed (allergies).   . triamcinolone cream (KENALOG) 0.1 % triamcinolone acetonide 0.1 % topical cream  . [DISCONTINUED] Biotin 2500 MCG CAPS Take by mouth.  . [DISCONTINUED] CALCIUM PO Take 770 mg by mouth.   No facility-administered encounter medications on file as of 11/07/2017.     Activities of Daily Living In your present state of health, do you have any difficulty performing the following activities: 11/07/2017  Hearing? N  Vision? N  Difficulty concentrating or making decisions? N  Walking or climbing stairs? N  Dressing or bathing? N  Doing errands, shopping? N  Preparing Food and eating ? N  Using the  Toilet? N  In the past six months, have you accidently leaked urine? Y  Comment wears panty liner  Do you have problems with loss of bowel control? N  Managing your Medications? N  Managing your Finances? N  Housekeeping or managing your Housekeeping? N  Some recent data might be hidden    Patient Care Team: Carollee Herter, Alferd Apa, DO as PCP - General (Family Medicine) Jerelyn Charles, MD as Consulting Physician (Obstetrics) Dermatology, [facility]    Assessment:   This is a routine wellness examination for Riva. Physical assessment deferred to PCP.  Exercise Activities and Dietary recommendations Current Exercise Habits: The patient does not participate in regular exercise at present, Exercise limited by: None identified Diet (meal preparation, eat out, water intake, caffeinated beverages, dairy products, fruits and vegetables): in general, a "healthy" diet  , well balanced. Drinks plenty  Of water      Goals    . Exercise 150 minutes per week (moderate activity)     Goal is to start walking again at the y        Fall Risk Fall Risk  11/07/2017 07/29/2016 06/30/2015 04/24/2015 08/20/2014  Falls in the past year? No No No No No  Number falls in past yr: - - - - -  Comment - - - - -  Injury with Fall? - - - - -     Depression Screen PHQ 2/9 Scores 11/07/2017 08/05/2017 07/29/2016 06/30/2015  PHQ - 2 Score 0 4 0 0  PHQ- 9 Score - 8 - -     Cognitive Function Ad8 score reviewed for issues:  Issues making decisions:no  Less interest in hobbies / activities:no  Repeats questions, stories (family complaining):no  Trouble using ordinary gadgets (microwave, computer, phone):no  Forgets the month or year: no  Mismanaging finances: no  Remembering appts:no  Daily problems with thinking and/or memory:no  Ad8 score is=0     MMSE - Mini Mental State Exam 07/29/2016 04/24/2015  Not completed: - (No Data)  Orientation to time 5 -  Orientation to Place 5 -  Registration 3 -    Attention/ Calculation 5 -  Recall 3 -  Language- name 2 objects 2 -  Language- repeat 1 -  Language- follow 3 step command 3 -  Language- read & follow direction 1 -  Write a sentence 1 -  Copy design 1 -  Total score 30 -        Immunization History  Administered Date(s) Administered  . Influenza Whole 05/16/2012  . Influenza, High Dose Seasonal PF 02/21/2014, 01/19/2016  . Influenza-Unspecified 01/29/2015, 02/01/2017  . Pneumococcal Conjugate-13 01/08/2015  . Pneumococcal Polysaccharide-23 06/11/2011  . Td 01/25/2008  . Zoster 12/13/2012  . Zoster Recombinat (Shingrix) 08/02/2016, 10/01/2016    Screening Tests Health Maintenance  Topic Date Due  . MAMMOGRAM  10/17/2016  . INFLUENZA VACCINE  11/24/2017  . TETANUS/TDAP  01/24/2018  . DEXA SCAN  06/24/2019  . COLONOSCOPY  08/19/2022  . Hepatitis C Screening  Completed  . PNA vac Low Risk Adult  Completed       Plan:    Please schedule your next medicare wellness visit with me in 1 yr.  Continue to eat heart healthy diet (full of fruits, vegetables, whole grains, lean protein, water--limit salt, fat, and sugar intake) and increase physical activity as tolerated.  Continue doing brain stimulating activities (puzzles, reading, adult coloring books, staying active) to keep memory sharp.     I have personally reviewed and noted the following in the patient's chart:   . Medical and social history . Use of alcohol, tobacco or illicit drugs  . Current medications and supplements . Functional ability and status . Nutritional status . Physical activity . Advanced directives . List of other physicians . Hospitalizations, surgeries, and ER visits in previous 12 months . Vitals . Screenings to include cognitive, depression, and falls . Referrals and appointments  In addition, I have reviewed and discussed with patient certain preventive protocols, quality metrics, and best practice recommendations. A written  personalized care plan for preventive services as well as general preventive health recommendations were provided to patient.     Shela Nevin, [place]  11/07/2017

## 2017-11-07 ENCOUNTER — Encounter: Payer: Self-pay | Admitting: *Deleted

## 2017-11-07 ENCOUNTER — Ambulatory Visit (INDEPENDENT_AMBULATORY_CARE_PROVIDER_SITE_OTHER): Payer: Medicare Other | Admitting: *Deleted

## 2017-11-07 VITALS — BP 120/72 | HR 70 | Ht 63.0 in | Wt 142.2 lb

## 2017-11-07 DIAGNOSIS — Z0000 Encounter for general adult medical examination without abnormal findings: Secondary | ICD-10-CM

## 2017-11-07 NOTE — Patient Instructions (Signed)
Please schedule your next medicare wellness visit with me in 1 yr.  Continue to eat heart healthy diet (full of fruits, vegetables, whole grains, lean protein, water--limit salt, fat, and sugar intake) and increase physical activity as tolerated.  Continue doing brain stimulating activities (puzzles, reading, adult coloring books, staying active) to keep memory sharp.    Kim Mclaughlin , Thank you for taking time to come for your Medicare Wellness Visit. I appreciate your ongoing commitment to your health goals. Please review the following plan we discussed and let me know if I can assist you in the future.   These are the goals we discussed: Goals    . Exercise 150 minutes per week (moderate activity)     Goal is to start walking again at the y        This is a list of the screening recommended for you and due dates:  Health Maintenance  Topic Date Due  . Mammogram  10/17/2016  . Flu Shot  11/24/2017  . Tetanus Vaccine  01/24/2018  . DEXA scan (bone density measurement)  06/24/2019  . Colon Cancer Screening  08/19/2022  .  Hepatitis C: One time screening is recommended by Center for Disease Control  (CDC) for  adults born from [birth year] through 1965.   Completed  . Pneumonia vaccines  Completed    Health Maintenance for Postmenopausal Women Menopause is a normal process in which your reproductive ability comes to an end. This process happens gradually over a span of months to years, usually between the ages of 49 and 36. Menopause is complete when you have missed 12 consecutive menstrual periods. It is important to talk with your health care provider about some of the most common conditions that affect postmenopausal women, such as heart disease, cancer, and bone loss (osteoporosis). Adopting a healthy lifestyle and getting preventive care can help to promote your health and wellness. Those actions can also lower your chances of developing some of these common conditions. What should I  know about menopause? During menopause, you may experience a number of symptoms, such as:  Moderate-to-severe hot flashes.  Night sweats.  Decrease in sex drive.  Mood swings.  Headaches.  Tiredness.  Irritability.  Memory problems.  Insomnia.  Choosing to treat or not to treat menopausal changes is an individual decision that you make with your health care provider. What should I know about hormone replacement therapy and supplements? Hormone therapy products are effective for treating symptoms that are associated with menopause, such as hot flashes and night sweats. Hormone replacement carries certain risks, especially as you become older. If you are thinking about using estrogen or estrogen with progestin treatments, discuss the benefits and risks with your health care provider. What should I know about heart disease and stroke? Heart disease, heart attack, and stroke become more likely as you age. This may be due, in part, to the hormonal changes that your body experiences during menopause. These can affect how your body processes dietary fats, triglycerides, and cholesterol. Heart attack and stroke are both medical emergencies. There are many things that you can do to help prevent heart disease and stroke:  Have your blood pressure checked at least every 1-2 years. High blood pressure causes heart disease and increases the risk of stroke.  If you are 43-72 years old, ask your health care provider if you should take aspirin to prevent a heart attack or a stroke.  Do not use any tobacco products, including cigarettes, chewing tobacco,  or electronic cigarettes. If you need help quitting, ask your health care provider.  It is important to eat a healthy diet and maintain a healthy weight. ? Be sure to include plenty of vegetables, fruits, low-fat dairy products, and lean protein. ? Avoid eating foods that are high in solid fats, added sugars, or salt (sodium).  Get regular  exercise. This is one of the most important things that you can do for your health. ? Try to exercise for at least 150 minutes each week. The type of exercise that you do should increase your heart rate and make you sweat. This is known as moderate-intensity exercise. ? Try to do strengthening exercises at least twice each week. Do these in addition to the moderate-intensity exercise.  Know your numbers.Ask your health care provider to check your cholesterol and your blood glucose. Continue to have your blood tested as directed by your health care provider.  What should I know about cancer screening? There are several types of cancer. Take the following steps to reduce your risk and to catch any cancer development as early as possible. Breast Cancer  Practice breast self-awareness. ? This means understanding how your breasts normally appear and feel. ? It also means doing regular breast self-exams. Let your health care provider know about any changes, no matter how small.  If you are 93 or older, have a clinician do a breast exam (clinical breast exam or CBE) every year. Depending on your age, family history, and medical history, it may be recommended that you also have a yearly breast X-ray (mammogram).  If you have a family history of breast cancer, talk with your health care provider about genetic screening.  If you are at high risk for breast cancer, talk with your health care provider about having an MRI and a mammogram every year.  Breast cancer (BRCA) gene test is recommended for women who have family members with BRCA-related cancers. Results of the assessment will determine the need for genetic counseling and BRCA1 and for BRCA2 testing. BRCA-related cancers include these types: ? Breast. This occurs in males or females. ? Ovarian. ? Tubal. This may also be called fallopian tube cancer. ? Cancer of the abdominal or pelvic lining (peritoneal  cancer). ? Prostate. ? Pancreatic.  Cervical, Uterine, and Ovarian Cancer Your health care provider may recommend that you be screened regularly for cancer of the pelvic organs. These include your ovaries, uterus, and vagina. This screening involves a pelvic exam, which includes checking for microscopic changes to the surface of your cervix (Pap test).  For women ages 21-65, health care providers may recommend a pelvic exam and a Pap test every three years. For women ages 56-65, they may recommend the Pap test and pelvic exam, combined with testing for human papilloma virus (HPV), every five years. Some types of HPV increase your risk of cervical cancer. Testing for HPV may also be done on women of any age who have unclear Pap test results.  Other health care providers may not recommend any screening for nonpregnant women who are considered low risk for pelvic cancer and have no symptoms. Ask your health care provider if a screening pelvic exam is right for you.  If you have had past treatment for cervical cancer or a condition that could lead to cancer, you need Pap tests and screening for cancer for at least 20 years after your treatment. If Pap tests have been discontinued for you, your risk factors (such as having a new  sexual partner) need to be reassessed to determine if you should start having screenings again. Some women have medical problems that increase the chance of getting cervical cancer. In these cases, your health care provider may recommend that you have screening and Pap tests more often.  If you have a family history of uterine cancer or ovarian cancer, talk with your health care provider about genetic screening.  If you have vaginal bleeding after reaching menopause, tell your health care provider.  There are currently no reliable tests available to screen for ovarian cancer.  Lung Cancer Lung cancer screening is recommended for adults 86-41 years old who are at high risk for  lung cancer because of a history of smoking. A yearly low-dose CT scan of the lungs is recommended if you:  Currently smoke.  Have a history of at least 30 pack-years of smoking and you currently smoke or have quit within the past 15 years. A pack-year is smoking an average of one pack of cigarettes per day for one year.  Yearly screening should:  Continue until it has been 15 years since you quit.  Stop if you develop a health problem that would prevent you from having lung cancer treatment.  Colorectal Cancer  This type of cancer can be detected and can often be prevented.  Routine colorectal cancer screening usually begins at age 80 and continues through age 75.  If you have risk factors for colon cancer, your health care provider may recommend that you be screened at an earlier age.  If you have a family history of colorectal cancer, talk with your health care provider about genetic screening.  Your health care provider may also recommend using home test kits to check for hidden blood in your stool.  A small camera at the end of a tube can be used to examine your colon directly (sigmoidoscopy or colonoscopy). This is done to check for the earliest forms of colorectal cancer.  Direct examination of the colon should be repeated every 5-10 years until age 39. However, if early forms of precancerous polyps or small growths are found or if you have a family history or genetic risk for colorectal cancer, you may need to be screened more often.  Skin Cancer  Check your skin from head to toe regularly.  Monitor any moles. Be sure to tell your health care provider: ? About any new moles or changes in moles, especially if there is a change in a mole's shape or color. ? If you have a mole that is larger than the size of a pencil eraser.  If any of your family members has a history of skin cancer, especially at a young age, talk with your health care provider about genetic  screening.  Always use sunscreen. Apply sunscreen liberally and repeatedly throughout the day.  Whenever you are outside, protect yourself by wearing long sleeves, pants, a wide-brimmed hat, and sunglasses.  What should I know about osteoporosis? Osteoporosis is a condition in which bone destruction happens more quickly than new bone creation. After menopause, you may be at an increased risk for osteoporosis. To help prevent osteoporosis or the bone fractures that can happen because of osteoporosis, the following is recommended:  If you are 71-38 years old, get at least 1,000 mg of calcium and at least 600 mg of vitamin D per day.  If you are older than age 47 but younger than age 64, get at least 1,200 mg of calcium and at least 600  mg of vitamin D per day.  If you are older than age 13, get at least 1,200 mg of calcium and at least 800 mg of vitamin D per day.  Smoking and excessive alcohol intake increase the risk of osteoporosis. Eat foods that are rich in calcium and vitamin D, and do weight-bearing exercises several times each week as directed by your health care provider. What should I know about how menopause affects my mental health? Depression may occur at any age, but it is more common as you become older. Common symptoms of depression include:  Low or sad mood.  Changes in sleep patterns.  Changes in appetite or eating patterns.  Feeling an overall lack of motivation or enjoyment of activities that you previously enjoyed.  Frequent crying spells.  Talk with your health care provider if you think that you are experiencing depression. What should I know about immunizations? It is important that you get and maintain your immunizations. These include:  Tetanus, diphtheria, and pertussis (Tdap) booster vaccine.  Influenza every year before the flu season begins.  Pneumonia vaccine.  Shingles vaccine.  Your health care provider may also recommend other  immunizations. This information is not intended to replace advice given to you by your health care provider. Make sure you discuss any questions you have with your health care provider. Document Released: 06/04/2005 Document Revised: 10/31/2015 Document Reviewed: 01/14/2015 Elsevier Interactive Patient Education  2018 [employer].

## 2017-11-07 NOTE — Progress Notes (Signed)
Reviewed  Yvonne R Lowne Chase, DO  

## 2017-12-23 ENCOUNTER — Ambulatory Visit: Payer: Self-pay | Admitting: *Deleted

## 2017-12-23 NOTE — Telephone Encounter (Signed)
Pt calling stating that she has been feeling lightheaded and nauseous since this morning which she thinks may be due to vertigo. Pt states this morning she felt like her stomach was hard and pt states she only at blue berries and tea this morning.Pt states she also tried to drink coke, ice water and crackers to see if it would help improve her symptoms. Pt states she has a history of vertigo but has not experienced an episode in months. Pt states she did take over the counter motion sickness medication but has not had any improvement. Pt also mentions that she thought that throwing up would make her feel better so she stuck her toothbrush down her throat which made her vomit a pink/burgundy colored emesis. Pt also mentions that she did eat oatmeal cookies with cranberries last night and also ate out of date pimento cheese. Pt states she felt ok last night and did not start feeling lightheaded until this morning. Pt scheduled for Saturday clinic and advised to seek care in the ED if symptoms become worse before scheduled appt. Pt verbalized understanding.  Reason for Disposition . [1] MODERATE dizziness (e.g., vertigo; feels very unsteady, interferes with normal activities) AND [2] has NOT been evaluated by physician for this  Answer Assessment - Initial Assessment Questions 1. DESCRIPTION: "Describe your dizziness."     Feels week with standing up and feels like I am going to throw up again 2. VERTIGO: "Do you feel like either you or the room is spinning or tilting?"      No 3. LIGHTHEADED: "Do you feel lightheaded?" (e.g., somewhat faint, woozy, weak upon standing)     Feels weak with standing 4. SEVERITY: "How bad is it?"  "Can you walk?"   - MILD - Feels unsteady but walking normally.   - MODERATE - Feels very unsteady when walking, but not falling; interferes with normal activities (e.g., school, work) .   - SEVERE - Unable to walk without falling (requires assistance).     Moderate walking slowly  and bent over 5. ONSET:  "When did the dizziness begin?"     This morining 6. AGGRAVATING FACTORS: "Does anything make it worse?" (e.g., standing, change in head position)     If I move too quickly it becomes worse 7. CAUSE: "What do you think is causing the dizziness?"     Unsure if vertigo or if related to eating out of date pimento cheese 8. RECURRENT SYMPTOM: "Have you had dizziness before?" If so, ask: "When was the last time?" "What happened that time?"     Its been months ago 9. OTHER SYMPTOMS: "Do you have any other symptoms?" (e.g., headache, weakness, numbness, vomiting, earache)     Weakness, and gagged herself with toothbrush in order to vomit 10. PREGNANCY: "Is there any chance you are pregnant?" "When was your last menstrual period?"       n/a  Protocols used: DIZZINESS - VERTIGO-A-AH

## 2017-12-24 ENCOUNTER — Ambulatory Visit (INDEPENDENT_AMBULATORY_CARE_PROVIDER_SITE_OTHER): Payer: Medicare Other | Admitting: Family Medicine

## 2017-12-24 ENCOUNTER — Encounter: Payer: Self-pay | Admitting: Family Medicine

## 2017-12-24 VITALS — BP 102/66 | HR 70 | Ht 63.0 in | Wt 140.0 lb

## 2017-12-24 DIAGNOSIS — R112 Nausea with vomiting, unspecified: Secondary | ICD-10-CM

## 2017-12-24 DIAGNOSIS — R42 Dizziness and giddiness: Secondary | ICD-10-CM | POA: Diagnosis not present

## 2017-12-24 MED ORDER — ONDANSETRON HCL 4 MG PO TABS: 4.0000 mg | ORAL_TABLET | Freq: Three times a day (TID) | ORAL | 0 refills | Status: DC | PRN

## 2017-12-24 MED ORDER — MECLIZINE HCL 25 MG PO TABS: 25.0000 mg | ORAL_TABLET | Freq: Three times a day (TID) | ORAL | 0 refills | Status: DC | PRN

## 2017-12-24 NOTE — Progress Notes (Signed)
Subjective:    Patient ID: Kim Mclaughlin, female    DOB: [birth year], 72 y.o.   MRN: [id number]  Chief Complaint  Patient presents with  . Dizziness  Patient accompanied by her husband.  HPI Patient was seen today for dizziness and nausea which started yesterday morning.  Pt states she took 2 OTC motion sickness tablets which did not work.  She went to bed as she began to feel worse.  Pt states the room does not feel like it is spinning.  Pt states her typical vertigo after she vomits she feels better.  Pt gagged herself to cause vomiting but did not feel better afterwards.  Pt states unsure if nausea caused by expired pimento cheese that she ate.  Past Medical History:  Diagnosis Date  . Arthritis 07/2014   Right hand, prescribed prednisone-no longer taking. also Rt knww  . DVT (deep venous thrombosis) ([place]) 2015   very small; treated with compression and heat  . GERD (gastroesophageal reflux disease)    patient denies states she does have hx of peptic ulcer many years  . Hepatitis    in 10 th grade  (not sure type- not hep C)  . Hyperlipidemia   . MVP (mitral valve prolapse)   . Osteopenia    [facility]  . PONV (postoperative nausea and vomiting)    with ear surgery only  . Precancerous lesion 12/2014   Treated with cream only  . Varicose veins     Allergies  Allergen Reactions  . Macrobid  [Nitrofurantoin Macrocrystal] Other (See Comments)  . Doxycycline Hives  . Pravastatin Sodium     REACTION: shin pain  . Amoxicillin-Pot Clavulanate     GI symptoms; abdominal pain & nausea NOTE: able to take Ampicillin  . Augmentin [Amoxicillin-Pot Clavulanate] Nausea Only    Upsets stomach  . Codeine Nausea Only  . Methylphenidate Hcl Palpitations  . Sulfamethoxazole-Trimethoprim Nausea Only     nausea    ROS General: Denies fever, chills, night sweats, changes in weight, changes in appetite  +dizziness HEENT: Denies headaches, ear pain, changes in vision, rhinorrhea, sore  throat CV: Denies CP, palpitations, SOB, orthopnea Pulm: Denies SOB, cough, wheezing GI: Denies abdominal pain, diarrhea, constipation  +N/V GU: Denies dysuria, hematuria, frequency, vaginal discharge Msk: Denies muscle cramps, joint pains Neuro: Denies weakness, numbness, tingling Skin: Denies rashes, bruising Psych: Denies depression, anxiety, hallucinations     Objective:    Blood pressure 102/66, pulse 70, height 5\' 3"  (1.6 m), weight 140 lb (63.5 kg), SpO2 96 %.   Gen. Pleasant, well-nourished, in no distress, normal affect  HEENT: [place]/AT, face symmetric, conjunctiva clear, no scleral icterus, PERRLA, EOMI with 2 beats of nystagmus with R vertical gaze, nares patent without drainage, TMs normal bilaterally.  Dix-Hallpike Maneuver performed. Lungs: no accessory muscle use, CTAB, no wheezes or rales Cardiovascular: RRR, no m/r/g, no peripheral edema Abdomen: BS present, soft, NT/ND, no hepatosplenomegaly. Musculoskeletal: No deformities, no cyanosis or clubbing, normal tone Neuro:  A&Ox3, CN II-XII intact, normal gait   Wt Readings from Last 3 Encounters:  12/24/17 140 lb (63.5 kg)  11/07/17 142 lb 3.2 oz (64.5 kg)  08/05/17 140 lb (63.5 kg)    Lab Results  Component Value Date   WBC 7.4 08/05/2017   HGB 14.6 08/05/2017   HCT 42.0 08/05/2017   PLT 389 08/05/2017   GLUCOSE 106 (H) 08/05/2017   CHOL 247 (H) 08/05/2017   TRIG 84 08/05/2017   HDL 79 08/05/2017  LDLDIRECT 188.0 08/08/2012   LDLCALC 149 (H) 08/05/2017   ALT 14 08/05/2017   AST 19 08/05/2017   NA 142 08/05/2017   K 5.0 08/05/2017   CL 103 08/05/2017   CREATININE 0.94 (H) 08/05/2017   BUN 12 08/05/2017   CO2 27 08/05/2017   TSH 1.30 06/30/2015    Assessment/Plan:  Vertigo  -improving after dix-hallpike -given hangout -advised to f/u with pcp for continued symptoms.  Consider Vestibular Rehab - Plan: meclizine (ANTIVERT) 25 MG tablet, ondansetron (ZOFRAN) 4 MG tablet  Non-intractable vomiting  with nausea, unspecified vomiting type  - Plan: ondansetron (ZOFRAN) 4 MG tablet  Grier Mitts, MD

## 2017-12-24 NOTE — Patient Instructions (Signed)
Vertigo Vertigo is the feeling that you or your surroundings are moving when they are not. Vertigo can be dangerous if it occurs while you are doing something that could endanger you or others, such as driving. What are the causes? This condition is caused by a disturbance in the signals that are sent by your body's sensory systems to your brain. Different causes of a disturbance can lead to vertigo, including:  Infections, especially in the inner ear.  A bad reaction to a drug, or misuse of alcohol and medicines.  Withdrawal from drugs or alcohol.  Quickly changing positions, as when lying down or rolling over in bed.  Migraine headaches.  Decreased blood flow to the brain.  Decreased blood pressure.  Increased pressure in the brain from a head or neck injury, stroke, infection, tumor, or bleeding.  Central nervous system disorders.  What are the signs or symptoms? Symptoms of this condition usually occur when you move your head or your eyes in different directions. Symptoms may start suddenly, and they usually last for less than a minute. Symptoms may include:  Loss of balance and falling.  Feeling like you are spinning or moving.  Feeling like your surroundings are spinning or moving.  Nausea and vomiting.  Blurred vision or double vision.  Difficulty hearing.  Slurred speech.  Dizziness.  Involuntary eye movement (nystagmus).  Symptoms can be mild and cause only slight annoyance, or they can be severe and interfere with daily life. Episodes of vertigo may return (recur) over time, and they are often triggered by certain movements. Symptoms may improve over time. How is this diagnosed? This condition may be diagnosed based on medical history and the quality of your nystagmus. Your health care provider may test your eye movements by asking you to quickly change positions to trigger the nystagmus. This may be called the Dix-Hallpike test, head thrust test, or roll test.  You may be referred to a health care provider who specializes in ear, nose, and throat (ENT) problems (otolaryngologist) or a provider who specializes in disorders of the central nervous system (neurologist). You may have additional testing, including:  A physical exam.  Blood tests.  MRI.  A CT scan.  An electrocardiogram (ECG). This records electrical activity in your heart.  An electroencephalogram (EEG). This records electrical activity in your brain.  Hearing tests.  How is this treated? Treatment for this condition depends on the cause and the severity of the symptoms. Treatment options include:  Medicines to treat nausea or vertigo. These are usually used for severe cases. Some medicines that are used to treat other conditions may also reduce or eliminate vertigo symptoms. These include: ? Medicines that control allergies (antihistamines). ? Medicines that control seizures (anticonvulsants). ? Medicines that relieve depression (antidepressants). ? Medicines that relieve anxiety (sedatives).  Head movements to adjust your inner ear back to normal. If your vertigo is caused by an ear problem, your health care provider may recommend certain movements to correct the problem.  Surgery. This is rare.  Follow these instructions at home: Safety  Move slowly.Avoid sudden body or head movements.  Avoid driving.  Avoid operating heavy machinery.  Avoid doing any tasks that would cause danger to you or others if you would have a vertigo episode during the task.  If you have trouble walking or keeping your balance, try using a cane for stability. If you feel dizzy or unstable, sit down right away.  Return to your normal activities as told by your  health care provider. Ask your health care provider what activities are safe for you. General instructions  Take over-the-counter and prescription medicines only as told by your health care provider.  Avoid certain positions or  movements as told by your health care provider.  Drink enough fluid to keep your urine clear or pale yellow.  Keep all follow-up visits as told by your health care provider. This is important. Contact a health care provider if:  Your medicines do not relieve your vertigo or they make it worse.  You have a fever.  Your condition gets worse or you develop new symptoms.  Your family or friends notice any behavioral changes.  Your nausea or vomiting gets worse.  You have numbness or a "pins and needles" sensation in part of your body. Get help right away if:  You have difficulty moving or speaking.  You are always dizzy.  You faint.  You develop severe headaches.  You have weakness in your hands, arms, or legs.  You have changes in your hearing or vision.  You develop a stiff neck.  You develop sensitivity to light. This information is not intended to replace advice given to you by your health care provider. Make sure you discuss any questions you have with your health care provider. Document Released: 01/20/2005 Document Revised: 09/24/2015 Document Reviewed: 08/05/2014 Elsevier Interactive Patient Education  2018 [employer].  Nausea and Vomiting, Adult Feeling sick to your stomach (nausea) means that your stomach is upset or you feel like you have to throw up (vomit). Feeling more and more sick to your stomach can lead to throwing up. Throwing up happens when food and liquid from your stomach are thrown up and out the mouth. Throwing up can make you feel weak and cause you to get dehydrated. Dehydration can make you tired and thirsty, make you have a dry mouth, and make it so you pee (urinate) less often. Older adults and people with other diseases or a weak defense system (immune system) are at higher risk for dehydration. If you feel sick to your stomach or if you throw up, it is important to follow instructions from your doctor about how to take care of yourself. Follow  these instructions at home: Eating and drinking Follow these instructions as told by your doctor:  Take an oral rehydration solution (ORS). This is a drink that is sold at pharmacies and stores.  Drink clear fluids in small amounts as you are able, such as: ? Water. ? Ice chips. ? Diluted fruit juice. ? Low-calorie sports drinks.  Eat bland, easy-to-digest foods in small amounts as you are able, such as: ? Bananas. ? Applesauce. ? Rice. ? Low-fat (lean) meats. ? Toast. ? Crackers.  Avoid fluids that have a lot of sugar or caffeine in them.  Avoid alcohol.  Avoid spicy or fatty foods.  General instructions  Drink enough fluid to keep your pee (urine) clear or pale yellow.  Wash your hands often. If you cannot use soap and water, use hand sanitizer.  Make sure that all people in your home wash their hands well and often.  Take over-the-counter and prescription medicines only as told by your doctor.  Rest at home while you get better.  Watch your condition for any changes.  Breathe slowly and deeply when you feel sick to your stomach.  Keep all follow-up visits as told by your doctor. This is important. Contact a doctor if:  You have a fever.  You cannot keep  fluids down.  Your symptoms get worse.  You have new symptoms.  You feel sick to your stomach for more than two days.  You feel light-headed or dizzy.  You have a headache.  You have muscle cramps. Get help right away if:  You have pain in your chest, neck, arm, or jaw.  You feel very weak or you pass out (faint).  You throw up again and again.  You see blood in your throw-up.  Your throw-up looks like black coffee grounds.  You have bloody or black poop (stools) or poop that look like tar.  You have a very bad headache, a stiff neck, or both.  You have a rash.  You have very bad pain, cramping, or bloating in your belly (abdomen).  You have trouble breathing.  You are breathing very  quickly.  Your heart is beating very quickly.  Your skin feels cold and clammy.  You feel confused.  You have pain when you pee.  You have signs of dehydration, such as: ? Dark pee, hardly any pee, or no pee. ? Cracked lips. ? Dry mouth. ? Sunken eyes. ? Sleepiness. ? Weakness. These symptoms may be an emergency. Do not wait to see if the symptoms will go away. Get medical help right away. Call your local emergency services (911 in the [place] Do not drive yourself to the hospital. This information is not intended to replace advice given to you by your health care provider. Make sure you discuss any questions you have with your health care provider. Document Released: 09/29/2007 Document Revised: 10/31/2015 Document Reviewed: 12/17/2014 Elsevier Interactive Patient Education  2018 [employer].

## 2018-03-24 ENCOUNTER — Ambulatory Visit: Payer: Self-pay

## 2018-03-24 NOTE — Telephone Encounter (Signed)
Returned call to patient who states that she has frequency and burning with urination. The symptoms started today. She says her urine is cloudy with an odor. She was hoping for Cephalaxin RX to be called in for her. Pt was advised that she would need to be seen. Pt denies flank pain and fever. Care advice read to patient. Pt verbalized understanding.  Appointment tomorrow with the Saturday clinic per protocol.  Reason for Disposition . Urinating more frequently than usual (i.e., frequency)  Answer Assessment - Initial Assessment Questions 1. SYMPTOM: "What's the main symptom you're concerned about?" (e.g., frequency, incontinence)     Frequency burning 2. ONSET: "When did the  symptom  start?"     today 3. PAIN: "Is there any pain?" If so, ask: "How bad is it?" (Scale: 1-10; mild, moderate, severe)     6-7 4. CAUSE: "What do you think is causing the symptoms?"     UTI 5. OTHER SYMPTOMS: "Do you have any other symptoms?" (e.g., fever, flank pain, blood in urine, pain with urination)     Burning pain with urination 6. PREGNANCY: "Is there any chance you are pregnant?" "When was your last menstrual period?"     No  Protocols used: URINARY [facility]

## 2018-03-24 NOTE — Telephone Encounter (Signed)
Spoke with patient who states she was on the phone With Dr Etter Sjogren chase at [facility] office. Pt was unsure if she was being directly connected to the office. Pt verified her information. I will return call in 10 minutes to assure her need were addressed.

## 2018-03-25 ENCOUNTER — Ambulatory Visit: Payer: Medicare Other | Admitting: Family Medicine

## 2018-04-25 ENCOUNTER — Ambulatory Visit: Payer: Self-pay | Admitting: *Deleted

## 2018-04-25 NOTE — Telephone Encounter (Signed)
Patient called in and stated she hurt her back putting away Christmas decorations and wants to know if a muscle relaxer can be sent in or what she should do  Call to patient- she is calling very close to office closing for holiday- triage dictates she needs to be seen and evaluated for her pain. Advised since she does have orthopedic doctor- she can call them and see if they have some availability.  If not- advised UC/ED for check of back and pain control. Reason for Disposition . [1] SEVERE abdominal pain AND [2] present > 1 hour  Answer Assessment - Initial Assessment Questions 1. ONSET: "When did the pain begin?"      Yesterday- she helped take down decorations at church- bending and vacuuming 2. LOCATION: "Where does it hurt?" (upper, mid or lower back)     Right side of back over hip 3. SEVERITY: "How bad is the pain?"  (e.g., Scale 1-10; mild, moderate, or severe)   - MILD (1-3): doesn't interfere with normal activities    - MODERATE (4-7): interferes with normal activities or awakens from sleep    - SEVERE (8-10): excruciating pain, unable to do any normal activities      10 4. PATTERN: "Is the pain constant?" (e.g., yes, no; constant, intermittent)      constant 5. RADIATION: "Does the pain shoot into your legs or elsewhere?"     Walking does seem to help- has not traveled- just in one spot 6. CAUSE:  "What do you think is causing the back pain?"      Feels muscular feels like she may have pulled something 7. BACK OVERUSE:  "Any recent lifting of heavy objects, strenuous work or exercise?"     Putting up Christmas decorations 8. MEDICATIONS: "What have you taken so far for the pain?" (e.g., nothing, acetaminophen, NSAIDS)     Tylenol, ice, rubs 9. NEUROLOGIC SYMPTOMS: "Do you have any weakness, numbness, or problems with bowel/bladder control?"     no 10. OTHER SYMPTOMS: "Do you have any other symptoms?" (e.g., fever, abdominal pain, burning with urination, blood in urine)  no 11. PREGNANCY: "Is there any chance you are pregnant?" (e.g., yes, no; LMP)       n/a  Protocols used: BACK PAIN-A-AH

## 2018-05-12 ENCOUNTER — Encounter: Payer: Self-pay | Admitting: Family Medicine

## 2018-05-12 ENCOUNTER — Ambulatory Visit (INDEPENDENT_AMBULATORY_CARE_PROVIDER_SITE_OTHER): Payer: Medicare Other | Admitting: Family Medicine

## 2018-05-12 VITALS — BP 140/70 | HR 68 | Temp 98.0°F | Ht 63.0 in | Wt 145.6 lb

## 2018-05-12 DIAGNOSIS — J40 Bronchitis, not specified as acute or chronic: Secondary | ICD-10-CM | POA: Diagnosis not present

## 2018-05-12 MED ORDER — AZITHROMYCIN 250 MG PO TABS: ORAL_TABLET | ORAL | 0 refills | Status: DC

## 2018-05-12 NOTE — Progress Notes (Signed)
Patient ID: Kim Mclaughlin, female    DOB: 28-Jul-1945  Age: 73 y.o. MRN: [id number]    Subjective:  Subjective  HPI Kim Mclaughlin presents for cough and congestion x 16 days.  No sinus pressure or ear involvement.  No fever.  Pt was on pred taper but stopped it due to skin reaction  Review of Systems  Constitutional: Negative for appetite change, chills, diaphoresis, fatigue, fever and unexpected weight change.  HENT: Positive for congestion, postnasal drip and rhinorrhea. Negative for sinus pressure.   Eyes: Negative for pain, redness and visual disturbance.  Respiratory: Positive for cough. Negative for chest tightness, shortness of breath and wheezing.   Cardiovascular: Negative for chest pain, palpitations and leg swelling.  Endocrine: Negative for cold intolerance, heat intolerance, polydipsia, polyphagia and polyuria.  Genitourinary: Negative for difficulty urinating, dysuria and frequency.  Allergic/Immunologic: Negative for environmental allergies.  Neurological: Negative for dizziness, light-headedness, numbness and headaches.    History Past Medical History:  Diagnosis Date  . Arthritis 07/2014   Right hand, prescribed prednisone-no longer taking. also Rt knww  . DVT (deep venous thrombosis) ([place]) 2015   very small; treated with compression and heat  . GERD (gastroesophageal reflux disease)    patient denies states she does have hx of peptic ulcer many years  . Hepatitis    in 10 th grade  (not sure type- not hep C)  . Hyperlipidemia   . MVP (mitral valve prolapse)   . Osteopenia    [facility]  . PONV (postoperative nausea and vomiting)    with ear surgery only  . Precancerous lesion 12/2014   Treated with cream only  . Varicose veins     She has a past surgical history that includes Appendectomy; Tubal ligation; Stapedectomy; G 3 P 3; Dilation and curettage of uterus; colonoscopy with polypectomy (07/2012); Endovenous ablation saphenous vein w/ laser (Left,  01-31-2014); Endovenous ablation saphenous vein w/ laser (Right, 03-14-2014); Dilation and curettage of uterus (N/A, 09/03/2014); Hysteroscopy w/D&C (N/A, 03/25/2016); Foot arthrodesis (Left, 04/22/2016); Bunionectomy with hammertoe reconstruction (Left, 04/22/2016); and Tarsal metatarsal fusion with weil osteotomy (Left, 04/22/2016).   Her family history includes Aneurysm in her paternal uncle; Breast cancer in her paternal aunt; Cancer in her unknown relative; Colon cancer in her father and paternal grandmother; Heart attack in her father and paternal grandfather; Miscarriages / [place] in her mother; Ovarian cancer in her maternal aunt; Pancreatic cancer in her mother; Stroke in her paternal grandmother; Thyroid disease in her mother.She reports that she quit smoking about 55 years ago. She has never used smokeless tobacco. She reports that she does not drink alcohol or use drugs.  Current Outpatient Medications on File Prior to Visit  Medication Sig Dispense Refill  . aspirin EC 81 MG tablet Take 81 mg by mouth daily.    . cetirizine (ZYRTEC) 10 MG tablet Take 10 mg by mouth daily.    . cholecalciferol (VITAMIN D) 1000 units tablet Take 1,000 Units by mouth daily.    . clotrimazole-betamethasone (LOTRISONE) cream Apply 1 application topically 2 (two) times daily. (Patient taking differently: Apply 1 application topically 2 (two) times daily as needed (Mclaughlin on wrist). ) 30 g 0  . diclofenac sodium (VOLTAREN) 1 % GEL Apply 2 g topically as needed.    . Glucosamine HCl-MSM (MSM GLUCOSAMINE PO) Take 2 tablets by mouth daily.     . meclizine (ANTIVERT) 25 MG tablet Take 1 tablet (25 mg total) by mouth 3 (three) times daily as  needed for dizziness. 30 tablet 0  . Multiple Vitamins-Minerals (CENTRUM SILVER PO) Take 1 tablet by mouth daily.    . Omega-3 Fatty Acids (FISH OIL) 1000 MG CAPS Take 1 capsule by mouth daily.     . ondansetron (ZOFRAN) 4 MG tablet Take 1 tablet (4 mg total) by mouth every 8  (eight) hours as needed for nausea or vomiting. 20 tablet 0  . OVER THE COUNTER MEDICATION Take 1 tablet by mouth 3 (three) times daily. [employer] Bone Strength supplement    . triamcinolone (NASACORT) 55 MCG/ACT AERO nasal inhaler Place 2 sprays into the nose daily as needed (allergies).     . triamcinolone cream (KENALOG) 0.1 % triamcinolone acetonide 0.1 % topical cream     No current facility-administered medications on file prior to visit.      Objective:  Objective  Physical Exam Vitals signs and nursing note reviewed.  Constitutional:      Appearance: She is well-developed.  HENT:     Right Ear: External ear normal.     Left Ear: External ear normal.  Eyes:     General:        Right eye: No discharge.        Left eye: No discharge.     Conjunctiva/sclera: Conjunctivae normal.  Cardiovascular:     Rate and Rhythm: Normal rate and regular rhythm.     Heart sounds: Normal heart sounds. No murmur.  Pulmonary:     Effort: Pulmonary effort is normal. No respiratory distress.     Breath sounds: Decreased air movement present. Wheezing and rhonchi present. No rales.  Chest:     Chest wall: No tenderness.  Lymphadenopathy:     Cervical: Cervical adenopathy present.  Neurological:     Mental Status: She is alert and oriented to person, place, and time.    BP 140/70 (BP Location: Left Arm, Patient Position: Sitting, Cuff Size: Normal)   Pulse 68   Temp 98 F (36.7 C) (Oral)   Ht 5\' 3"  (1.6 m)   Wt 145 lb 9.6 oz (66 kg)   SpO2 96%   BMI 25.79 kg/m  Wt Readings from Last 3 Encounters:  05/12/18 145 lb 9.6 oz (66 kg)  12/24/17 140 lb (63.5 kg)  11/07/17 142 lb 3.2 oz (64.5 kg)     Lab Results  Component Value Date   WBC 7.4 08/05/2017   HGB 14.6 08/05/2017   HCT 42.0 08/05/2017   PLT 389 08/05/2017   GLUCOSE 106 (H) 08/05/2017   CHOL 247 (H) 08/05/2017   TRIG 84 08/05/2017   HDL 79 08/05/2017   LDLDIRECT 188.0 08/08/2012   LDLCALC 149 (H) 08/05/2017   ALT 14  08/05/2017   AST 19 08/05/2017   NA 142 08/05/2017   K 5.0 08/05/2017   CL 103 08/05/2017   CREATININE 0.94 (H) 08/05/2017   BUN 12 08/05/2017   CO2 27 08/05/2017   TSH 1.30 06/30/2015    No results found.   Assessment & Plan:  Plan  I am having Kim Mclaughlin start on azithromycin. I am also having her maintain her triamcinolone, Fish Oil, cetirizine, Glucosamine HCl-MSM (MSM GLUCOSAMINE PO), clotrimazole-betamethasone, cholecalciferol, Multiple Vitamins-Minerals (CENTRUM SILVER PO), OVER THE COUNTER MEDICATION, diclofenac sodium, triamcinolone cream, aspirin EC, meclizine, and ondansetron.  Meds ordered this encounter  Medications  . azithromycin (ZITHROMAX Z-PAK) 250 MG tablet    Sig: As directed    Dispense:  6 each    Refill:  0  Problem List Items Addressed This Visit    None    Visit Diagnoses    Bronchitis    -  Primary   Relevant Medications   azithromycin (ZITHROMAX Z-PAK) 250 MG tablet    con't mucinex / delsym  rto prn  Follow-up: Return if symptoms worsen or fail to improve.  Ann Held, DO

## 2018-05-12 NOTE — Patient Instructions (Signed)
Acute Bronchitis, Adult Acute bronchitis is when air tubes (bronchi) in the lungs suddenly get swollen. The condition can make it hard to breathe. It can also cause these symptoms:  A cough.  Coughing up clear, yellow, or green mucus.  Wheezing.  Chest congestion.  Shortness of breath.  A fever.  Body aches.  Chills.  A sore throat. Follow these instructions at home:  Medicines  Take over-the-counter and prescription medicines only as told by your doctor.  If you were prescribed an antibiotic medicine, take it as told by your doctor. Do not stop taking the antibiotic even if you start to feel better. General instructions  Rest.  Drink enough fluids to keep your pee (urine) pale yellow.  Avoid smoking and secondhand smoke. If you smoke and you need help quitting, ask your doctor. Quitting will help your lungs heal faster.  Use an inhaler, cool mist vaporizer, or humidifier as told by your doctor.  Keep all follow-up visits as told by your doctor. This is important. How is this prevented? To lower your risk of getting this condition again:  Wash your hands often with soap and water. If you cannot use soap and water, use hand sanitizer.  Avoid contact with people who have cold symptoms.  Try not to touch your hands to your mouth, nose, or eyes.  Make sure to get the flu shot every year. Contact a doctor if:  Your symptoms do not get better in 2 weeks. Get help right away if:  You cough up blood.  You have chest pain.  You have very bad shortness of breath.  You become dehydrated.  You faint (pass out) or keep feeling like you are going to pass out.  You keep throwing up (vomiting).  You have a very bad headache.  Your fever or chills gets worse. This information is not intended to replace advice given to you by your health care provider. Make sure you discuss any questions you have with your health care provider. Document Released: 09/29/2007 Document  Revised: 11/24/2016 Document Reviewed: 10/01/2015 Elsevier Interactive Patient Education  2019 Elsevier Inc.  

## 2018-05-19 ENCOUNTER — Encounter: Payer: Self-pay | Admitting: Family Medicine

## 2018-05-25 ENCOUNTER — Telehealth: Payer: Self-pay | Admitting: Gastroenterology

## 2018-05-25 NOTE — Telephone Encounter (Signed)
Left message for pt to call back  °

## 2018-05-25 NOTE — Telephone Encounter (Signed)
Pt states her father had colon cancer, his mother and his 2 sisters had colon cancer. Pt wants to know when she needs to have another colon. See note below.

## 2018-05-25 NOTE — Telephone Encounter (Signed)
Yes, I see that Dr. Olevia Perches recommended a repeat colonoscopy in 10 years.   I am glad Kim Mclaughlin called, because with that family history, she should have a colonoscopy this year.   Please arrange [place] nurse visit so she can be put on my schedule for colonoscopy.

## 2018-05-25 NOTE — Telephone Encounter (Signed)
PT was wondering if she didn't have to have a 60yr colon since she has family history of colon cancer. PT last colon was in 2014 and is not due until 2024. She would like to know if she does or does not need a 5 yr follow up for colon.

## 2018-05-25 NOTE — Telephone Encounter (Signed)
Pt aware, scheduled for previsit 06/06/18@8 :30am and colon scheduled in the [facility] 06/22/18@1 :30pm.

## 2018-06-06 ENCOUNTER — Other Ambulatory Visit: Payer: Self-pay

## 2018-06-06 ENCOUNTER — Ambulatory Visit (AMBULATORY_SURGERY_CENTER): Payer: Self-pay | Admitting: *Deleted

## 2018-06-06 VITALS — Ht 63.0 in | Wt 141.8 lb

## 2018-06-06 DIAGNOSIS — Z800 Family history of malignant neoplasm of digestive organs: Secondary | ICD-10-CM

## 2018-06-06 MED ORDER — SUPREP BOWEL PREP KIT 17.5-3.13-1.6 GM/177ML PO SOLN: 1.0000 | Freq: Once | ORAL | 0 refills | Status: AC

## 2018-06-06 NOTE — Progress Notes (Signed)
Patient denies any allergies to egg or soy products. Patient denies complications with anesthesia/sedation.  Patient denies oxygen use at home and denies diet medications. Patient denies information on colonoscopy. 

## 2018-06-08 ENCOUNTER — Telehealth: Payer: Self-pay | Admitting: Gastroenterology

## 2018-06-08 NOTE — Telephone Encounter (Signed)
Pt notified okay to proceed with colonoscopy. Mole will be removed at her dr's office.

## 2018-06-08 NOTE — Telephone Encounter (Signed)
Pt is scheduled for colon on 2/27, she is having a mole removed with her dermatologist on 2/24, she wants to know if it is ok to have procedure. Pls call her.

## 2018-06-16 ENCOUNTER — Telehealth: Payer: Self-pay | Admitting: Gastroenterology

## 2018-06-16 NOTE — Telephone Encounter (Signed)
Spoke with patient and answered all of her questions at this time.

## 2018-06-22 ENCOUNTER — Ambulatory Visit (AMBULATORY_SURGERY_CENTER): Payer: Medicare Other | Admitting: Gastroenterology

## 2018-06-22 ENCOUNTER — Encounter: Payer: Self-pay | Admitting: Gastroenterology

## 2018-06-22 VITALS — BP 108/59 | HR 70 | Temp 98.9°F | Resp 16 | Ht 63.0 in | Wt 141.8 lb

## 2018-06-22 DIAGNOSIS — Z800 Family history of malignant neoplasm of digestive organs: Secondary | ICD-10-CM | POA: Diagnosis not present

## 2018-06-22 DIAGNOSIS — Z1211 Encounter for screening for malignant neoplasm of colon: Secondary | ICD-10-CM | POA: Diagnosis not present

## 2018-06-22 MED ORDER — SODIUM CHLORIDE 0.9 % IV SOLN: 500.0000 mL | Freq: Once | INTRAVENOUS | Status: DC

## 2018-06-22 NOTE — Progress Notes (Signed)
Pt's states no medical or surgical changes since previsit or office visit. 

## 2018-06-22 NOTE — Patient Instructions (Signed)
Thank you for allowing [place] to care for you today!  Resume previous diet and medications today.  Return to normal activities tomorrow.  Recommend follow-up in 5 years for screening purposes.      YOU HAD AN ENDOSCOPIC PROCEDURE TODAY AT [facility]:   Refer to the procedure report that was given to you for any specific questions about what was found during the examination.  If the procedure report does not answer your questions, please call your gastroenterologist to clarify.  If you requested that your care partner not be given the details of your procedure findings, then the procedure report has been included in a sealed envelope for you to review at your convenience later.  YOU SHOULD EXPECT: Some feelings of bloating in the abdomen. Passage of more gas than usual.  Walking can help get rid of the air that was put into your GI tract during the procedure and reduce the bloating. If you had a lower endoscopy (such as a colonoscopy or flexible sigmoidoscopy) you may notice spotting of blood in your stool or on the toilet paper. If you underwent a bowel prep for your procedure, you may not have a normal bowel movement for a few days.  Please Note:  You might notice some irritation and congestion in your nose or some drainage.  This is from the oxygen used during your procedure.  There is no need for concern and it should clear up in a day or so.  SYMPTOMS TO REPORT IMMEDIATELY:   Following lower endoscopy (colonoscopy or flexible sigmoidoscopy):  Excessive amounts of blood in the stool  Significant tenderness or worsening of abdominal pains  Swelling of the abdomen that is new, acute  Fever of 100F or higher    Fever of 100F or higher  Black, tarry-looking stools  For urgent or emergent issues, a gastroenterologist can be reached at any hour by calling [phone].   DIET:  We do recommend a small meal at first, but then you may proceed to your regular diet.  Drink  plenty of fluids but you should avoid alcoholic beverages for 24 hours.  ACTIVITY:  You should plan to take it easy for the rest of today and you should NOT DRIVE or use heavy machinery until tomorrow (because of the sedation medicines used during the test).    FOLLOW UP: Our staff will call the number listed on your records the next business day following your procedure to check on you and address any questions or concerns that you may have regarding the information given to you following your procedure. If we do not reach you, we will leave a message.  However, if you are feeling well and you are not experiencing any problems, there is no need to return our call.  We will assume that you have returned to your regular daily activities without incident.  If any biopsies were taken you will be contacted by phone or by letter within the next 1-3 weeks.  Please call us at [phone] if you have not heard about the biopsies in 3 weeks.    SIGNATURES/CONFIDENTIALITY: You and/or your care partner have signed paperwork which will be entered into your electronic medical record.  These signatures attest to the fact that that the information above on your After Visit Summary has been reviewed and is understood.  Full responsibility of the confidentiality of this discharge information lies with you and/or your care-partner.

## 2018-06-22 NOTE — Progress Notes (Signed)
To PACU, VSS. Report to [email or web address] 

## 2018-06-22 NOTE — Op Note (Signed)
[facility] Patient Name: Kim Mclaughlin Procedure Date: 06/22/2018 1:18 PM MRN: [id number] Endoscopist: Mallie Mussel L. Loletha Carrow , MD Age: 73 Referring MD:  Date of Birth: 11/18/1945 Gender: Female Account #: [id number] Procedure:                Colonoscopy Indications:              Colon cancer screening in patient at increased                            risk: Colorectal cancer in father and maternal GM                            (patient without adenomatous polyps 02/2013) Medicines:                Monitored Anesthesia Care Procedure:                Pre-Anesthesia Assessment:                           - Prior to the procedure, a History and Physical                            was performed, and patient medications and                            allergies were reviewed. The patient's tolerance of                            previous anesthesia was also reviewed. The risks                            and benefits of the procedure and the sedation                            options and risks were discussed with the patient.                            All questions were answered, and informed consent                            was obtained. Anticoagulants: The patient has taken                            aspirin. It was decided not to withhold this                            medication prior to the procedure. ASA Grade                            Assessment: II - A patient with mild systemic                            disease. After reviewing the risks and benefits,  the patient was deemed in satisfactory condition to                            undergo the procedure.                           After obtaining informed consent, the colonoscope                            was passed under direct vision. Throughout the                            procedure, the patient's blood pressure, pulse, and                            oxygen saturations were monitored  continuously. The                            Colonoscope was introduced through the anus and                            advanced to the the cecum, identified by                            appendiceal orifice and ileocecal valve. The                            colonoscopy was performed without difficulty. The                            patient tolerated the procedure well. The quality                            of the bowel preparation was excellent. The                            ileocecal valve, appendiceal orifice, and rectum                            were photographed. The quality of the bowel                            preparation was evaluated using the BBPS [facility]                            Bowel Preparation Scale) with scores of: Right                            Colon = 3, Transverse Colon = 3 and Left Colon = 3.                            The total BBPS score equals 9. Scope In: 1:38:05 PM Scope Out: 1:51:36 PM Scope Withdrawal Time: 0 hours 7 minutes 39 seconds  Total  Procedure Duration: 0 hours 13 minutes 31 seconds  Findings:                 The perianal and digital rectal examinations were                            normal.                           The entire examined colon appeared normal.                           Retroflexion in the rectum was not performed due to                            anatomy. Complications:            No immediate complications. Estimated Blood Loss:     Estimated blood loss: none. Impression:               - The entire examined colon is normal.                           - No specimens collected. Recommendation:           - Patient has a contact number available for                            emergencies. The signs and symptoms of potential                            delayed complications were discussed with the                            patient. Return to normal activities tomorrow.                            Written discharge instructions  were provided to the                            patient.                           - Resume previous diet.                           - Continue present medications.                           - Repeat colonoscopy in 5 years for screening                            purposes. Jenalyn Girdner L. Loletha Carrow, MD 06/22/2018 1:55:15 PM This report has been signed electronically.

## 2018-06-23 ENCOUNTER — Telehealth: Payer: Self-pay | Admitting: *Deleted

## 2018-06-23 NOTE — Telephone Encounter (Signed)
  Follow up Call-  Call back number 06/22/2018  Post procedure Call Back phone  # [phone]  Permission to leave phone message Yes  Some recent data might be hidden     Patient questions:  Do you have a fever, pain , or abdominal swelling? No. Pain Score  0 *  Have you tolerated food without any problems? Yes.    Have you been able to return to your normal activities? Yes.    Do you have any questions about your discharge instructions: Diet   No. Medications  No. Follow up visit  No.  Do you have questions or concerns about your Care? No.  Actions: * If pain score is 4 or above: No action needed, pain <4.

## 2018-06-24 ENCOUNTER — Encounter: Payer: Self-pay | Admitting: Family Medicine

## 2018-08-11 ENCOUNTER — Encounter: Payer: Medicare Other | Admitting: Family Medicine

## 2018-10-18 ENCOUNTER — Other Ambulatory Visit: Payer: Self-pay

## 2018-10-18 ENCOUNTER — Other Ambulatory Visit: Payer: Self-pay | Admitting: Family Medicine

## 2018-10-18 ENCOUNTER — Other Ambulatory Visit (INDEPENDENT_AMBULATORY_CARE_PROVIDER_SITE_OTHER): Payer: Medicare Other

## 2018-10-18 DIAGNOSIS — E559 Vitamin D deficiency, unspecified: Secondary | ICD-10-CM

## 2018-10-18 DIAGNOSIS — E785 Hyperlipidemia, unspecified: Secondary | ICD-10-CM

## 2018-10-18 LAB — COMPREHENSIVE METABOLIC PANEL
ALT: 17 U/L (ref 0–35)
AST: 19 U/L (ref 0–37)
Albumin: 4.2 g/dL (ref 3.5–5.2)
Alkaline Phosphatase: 68 U/L (ref 39–117)
BUN: 14 mg/dL (ref 6–23)
CO2: 27 mEq/L (ref 19–32)
Calcium: 9.5 mg/dL (ref 8.4–10.5)
Chloride: 104 mEq/L (ref 96–112)
Creatinine, Ser: 0.85 mg/dL (ref 0.40–1.20)
GFR: 65.48 mL/min (ref >60.00)
Glucose, Bld: 85 mg/dL (ref 70–99)
Potassium: 4.4 mEq/L (ref 3.5–5.1)
Sodium: 141 mEq/L (ref 135–145)
Total Bilirubin: 0.6 mg/dL (ref 0.2–1.2)
Total Protein: 6.8 g/dL (ref 6.0–8.3)

## 2018-10-18 LAB — LIPID PANEL
Cholesterol: 230 mg/dL — ABNORMAL HIGH (ref 0–200)
HDL: 74.6 mg/dL (ref >39.00)
LDL Cholesterol: 138 mg/dL — ABNORMAL HIGH (ref 0–99)
NonHDL: 154.92
Total CHOL/HDL Ratio: 3
Triglycerides: 85 mg/dL (ref 0.0–149.0)
VLDL: 17 mg/dL (ref 0.0–40.0)

## 2018-10-18 LAB — VITAMIN D 25 HYDROXY (VIT D DEFICIENCY, FRACTURES): VITD: 84.24 ng/mL (ref 30.00–100.00)

## 2018-10-23 ENCOUNTER — Other Ambulatory Visit: Payer: Self-pay

## 2018-10-23 ENCOUNTER — Other Ambulatory Visit: Payer: Self-pay | Admitting: Family Medicine

## 2018-10-23 ENCOUNTER — Encounter: Payer: Self-pay | Admitting: Family Medicine

## 2018-10-23 ENCOUNTER — Ambulatory Visit (INDEPENDENT_AMBULATORY_CARE_PROVIDER_SITE_OTHER): Payer: Medicare Other | Admitting: Family Medicine

## 2018-10-23 VITALS — BP 131/61 | HR 82 | Temp 98.2°F | Resp 18 | Ht 63.0 in | Wt 147.0 lb

## 2018-10-23 DIAGNOSIS — E785 Hyperlipidemia, unspecified: Secondary | ICD-10-CM

## 2018-10-23 NOTE — Progress Notes (Signed)
-                   ubjective:     Kim Mclaughlin is a 73 y.o. female and is here for a comprehensive physical exam. The patient reports no problems.  Social History   Socioeconomic History  . Marital status: Married    Spouse name: Not on file  . Number of children: Not on file  . Years of education: Not on file  . Highest education level: Not on file  Occupational History  . Occupation: retired  Scientific laboratory technician  . Financial resource strain: Not on file  . Food insecurity    Worry: Not on file    Inability: Not on file  . Transportation needs    Medical: Not on file    Non-medical: Not on file  Tobacco Use  . Smoking status: Former Smoker    Types: Cigarettes    Quit date: 04/27/1963    Years since quitting: 55.5  . Smokeless tobacco: Never Used  . Tobacco comment: Smoked < 6 mos;< 1 pack total;quit 1965  Substance and Sexual Activity  . Alcohol use: No  . Drug use: No  . Sexual activity: Yes    Birth control/protection: Post-menopausal  Lifestyle  . Physical activity    Days per week: Not on file    Minutes per session: Not on file  . Stress: Not on file  Relationships  . Social Herbalist on phone: Not on file    Gets together: Not on file    Attends religious service: Not on file    Active member of club or organization: Not on file    Attends meetings of clubs or organizations: Not on file    Relationship status: Not on file  . Intimate partner violence    Fear of current or ex partner: Not on file    Emotionally abused: Not on file    Physically abused: Not on file    Forced sexual activity: Not on file  Other Topics Concern  . Not on file  Social History Narrative   Exercise-  Walks on Mondays and Wednesdays on the treadmill   Health Maintenance  Topic Date Due  . MAMMOGRAM  10/17/2016  . INFLUENZA VACCINE  11/25/2018  . DEXA SCAN  06/24/2019  . COLONOSCOPY  06/23/2023  . TETANUS/TDAP  06/26/2028  .  Hepatitis C Screening  Completed  . PNA vac Low Risk Adult  Completed    The following portions of the patient's history were reviewed and updated as appropriate: allergies, current medications, past family history, past medical history, past social history, past surgical history and problem list.  Review of Systems Review of Systems  Constitutional: Negative for activity change, appetite change and fatigue.  HENT: Negative for hearing loss, congestion, tinnitus and ear discharge.  dentist q29m Eyes: Negative for visual disturbance (see optho q1y -- vision corrected to 20/20 with glasses).  Respiratory: Negative for cough, chest tightness and shortness of breath.   Cardiovascular: Negative for chest pain, palpitations and leg swelling.  Gastrointestinal: Negative for abdominal pain, diarrhea, constipation and abdominal distention.  Genitourinary: Negative for urgency, frequency, decreased urine volume and difficulty urinating.  Musculoskeletal: Negative for back pain, arthralgias and gait problem.  Skin: Negative for color change, pallor and rash.  Neurological: Negative for dizziness, light-headedness, numbness and headaches.  Hematological: Negative for adenopathy. Does not bruise/bleed easily.  Psychiatric/Behavioral: Negative for suicidal  ideas, confusion, sleep disturbance, self-injury, dysphoric mood, decreased concentration and agitation.       Objective:    BP 131/61 (BP Location: Left Arm, Patient Position: Sitting, Cuff Size: Normal)   Pulse 82   Temp 98.2 F (36.8 C) (Oral)   Resp 18   Ht 5\' 3"  (1.6 m)   Wt 147 lb (66.7 kg)   SpO2 100%   BMI 26.04 kg/m  General appearance: alert, cooperative, appears stated age and no distress Head: Normocephalic, without obvious abnormality, atraumatic Eyes: conjunctivae/corneas clear. PERRL, EOM's intact. Fundi benign. Ears: normal TM's and external ear canals both ears Nose: Nares normal. Septum midline. Mucosa normal. No  drainage or sinus tenderness. Throat: lips, mucosa, and tongue normal; teeth and gums normal Neck: no adenopathy, no carotid bruit, no JVD, supple, symmetrical, trachea midline and thyroid not enlarged, symmetric, no tenderness/mass/nodules Back: symmetric, no curvature. ROM normal. No CVA tenderness. Lungs: clear to auscultation bilaterally Breasts: gyn Heart: regular rate and rhythm, S1, S2 normal, no murmur, click, rub or gallop Abdomen: soft, non-tender; bowel sounds normal; no masses,  no organomegaly Pelvic: deferred-gyn Extremities: extremities normal, atraumatic, no cyanosis or edema Pulses: 2+ and symmetric Skin: Skin color, texture, turgor normal. No rashes or lesions Lymph nodes: Cervical, supraclavicular, and axillary nodes normal. Neurologic: Alert and oriented X 3, normal strength and tone. Normal symmetric reflexes. Normal coordination and gait    Assessment:    Healthy female exam.      Plan:     ghm utd Check labs  See After Visit Summary for Counseling Recommendations   Labs reviewed with the patient

## 2018-10-23 NOTE — Addendum Note (Signed)
Addended by: Kelle Darting A on: 10/23/2018 02:29 PM   Modules accepted: Orders

## 2018-10-23 NOTE — Patient Instructions (Signed)
Preventive Care 38 Years and Older, Female Preventive care refers to lifestyle choices and visits with your health care provider that can promote health and wellness. This includes:  A yearly physical exam. This is also called an annual well check.  Regular dental and eye exams.  Immunizations.  Screening for certain conditions.  Healthy lifestyle choices, such as diet and exercise. What can I expect for my preventive care visit? Physical exam Your health care provider will check:  Height and weight. These may be used to calculate body mass index (BMI), which is a measurement that tells if you are at a healthy weight.  Heart rate and blood pressure.  Your skin for abnormal spots. Counseling Your health care provider may ask you questions about:  Alcohol, tobacco, and drug use.  Emotional well-being.  Home and relationship well-being.  Sexual activity.  Eating habits.  History of falls.  Memory and ability to understand (cognition).  Work and work Statistician.  Pregnancy and menstrual history. What immunizations do I need?  Influenza (flu) vaccine  This is recommended every year. Tetanus, diphtheria, and pertussis (Tdap) vaccine  You may need a Td booster every 10 years. Varicella (chickenpox) vaccine  You may need this vaccine if you have not already been vaccinated. Zoster (shingles) vaccine  You may need this after age 33. Pneumococcal conjugate (PCV13) vaccine  One dose is recommended after age 33. Pneumococcal polysaccharide (PPSV23) vaccine  One dose is recommended after age 72. Measles, mumps, and rubella (MMR) vaccine  You may need at least one dose of MMR if you were born in 1957 or later. You may also need a second dose. Meningococcal conjugate (MenACWY) vaccine  You may need this if you have certain conditions. Hepatitis A vaccine  You may need this if you have certain conditions or if you travel or work in places where you may be exposed  to hepatitis A. Hepatitis B vaccine  You may need this if you have certain conditions or if you travel or work in places where you may be exposed to hepatitis B. Haemophilus influenzae type b (Hib) vaccine  You may need this if you have certain conditions. You may receive vaccines as individual doses or as more than one vaccine together in one shot (combination vaccines). Talk with your health care provider about the risks and benefits of combination vaccines. What tests do I need? Blood tests  Lipid and cholesterol levels. These may be checked every 5 years, or more frequently depending on your overall health.  Hepatitis C test.  Hepatitis B test. Screening  Lung cancer screening. You may have this screening every year starting at age 39 if you have a 30-pack-year history of smoking and currently smoke or have quit within the past 15 years.  Colorectal cancer screening. All adults should have this screening starting at age 36 and continuing until age 15. Your health care provider may recommend screening at age 23 if you are at increased risk. You will have tests every 1-10 years, depending on your results and the type of screening test.  Diabetes screening. This is done by checking your blood sugar (glucose) after you have not eaten for a while (fasting). You may have this done every 1-3 years.  Mammogram. This may be done every 1-2 years. Talk with your health care provider about how often you should have regular mammograms.  BRCA-related cancer screening. This may be done if you have a family history of breast, ovarian, tubal, or peritoneal cancers.  Other tests  Sexually transmitted disease (STD) testing.  Bone density scan. This is done to screen for osteoporosis. You may have this done starting at age 55. Follow these instructions at home: Eating and drinking  Eat a diet that includes fresh fruits and vegetables, whole grains, lean protein, and low-fat dairy products. Limit  your intake of foods with high amounts of sugar, saturated fats, and salt.  Take vitamin and mineral supplements as recommended by your health care provider.  Do not drink alcohol if your health care provider tells you not to drink.  If you drink alcohol: ? Limit how much you have to 0-1 drink a day. ? Be aware of how much alcohol is in your drink. In the [place] one drink equals one 12 oz bottle of beer (355 mL), one 5 oz glass of wine (148 mL), or one 1 oz glass of hard liquor (44 mL). Lifestyle  Take daily care of your teeth and gums.  Stay active. Exercise for at least 30 minutes on 5 or more days each week.  Do not use any products that contain nicotine or tobacco, such as cigarettes, e-cigarettes, and chewing tobacco. If you need help quitting, ask your health care provider.  If you are sexually active, practice safe sex. Use a condom or other form of protection in order to prevent STIs (sexually transmitted infections).  Talk with your health care provider about taking a low-dose aspirin or statin. What's next?  Go to your health care provider once a year for a well check visit.  Ask your health care provider how often you should have your eyes and teeth checked.  Stay up to date on all vaccines. This information is not intended to replace advice given to you by your health care provider. Make sure you discuss any questions you have with your health care provider. Document Released: 05/09/2015 Document Revised: 04/06/2018 Document Reviewed: 04/06/2018 Elsevier Patient Education  2020 [employer].

## 2018-11-08 NOTE — Progress Notes (Deleted)
Virtual Visit via Video Note  I connected with patient on 11/09/18 at  8:00 AM EDT by a video enabled telemedicine application and verified that I am speaking with the correct person using two identifiers.   THIS ENCOUNTER IS A VIRTUAL VISIT DUE TO COVID-19 - PATIENT WAS NOT SEEN IN THE OFFICE. PATIENT HAS CONSENTED TO VIRTUAL VISIT / TELEMEDICINE VISIT   Location of patient: home  Location of provider: office  I discussed the limitations of evaluation and management by telemedicine and the availability of in person appointments. The patient expressed understanding and agreed to proceed.   Subjective:   Kim Mclaughlin is a 73 y.o. female who presents for Medicare Annual (Subsequent) preventive examination.  Review of Systems: No ROS.  Medicare Wellness Virtual Visit.  Visual/audio telehealth visit, UTA vital signs.   See social history for additional risk factors.   Sleep patterns:  Home Safety/Smoke Alarms: Feels safe in home. Smoke alarms in place.  Lives with husband in 1 story home.   Female:    Mammo-       Dexa scan- 06/23/17        CCS- due 2024     Objective:     Vitals: There were no vitals taken for this visit.  There is no height or weight on file to calculate BMI.  Advanced Directives 11/07/2017 07/29/2016 04/22/2016 04/16/2016 03/25/2016 03/16/2016 11/27/2015  Does Patient Have a Medical Advance Directive? Yes Yes Yes Yes - Yes Yes  Type of Advance Directive [place];Living will [place];Living will [place];Living will [place];Living will [place];Living will Living will [place];Living will  Does patient want to make changes to medical advance directive? - - No - Patient declined - - No - Patient declined -  Copy of [place] in Chart? Yes No - copy requested Yes - Yes No - copy requested -  Would patient like information on  creating a medical advance directive? - - - - - - -    Tobacco Social History   Tobacco Use  Smoking Status Former Smoker  . Types: Cigarettes  . Quit date: 04/27/1963  . Years since quitting: 55.5  Smokeless Tobacco Never Used  Tobacco Comment   Smoked < 6 mos;< 1 pack total;quit 1965     Counseling given: Not Answered Comment: Smoked < 6 mos;< 1 pack total;quit 1965   Clinical Intake:                       Past Medical History:  Diagnosis Date  . Allergy   . Arthritis 07/2014   Right hand, prescribed prednisone-no longer taking. also Rt knww  . DVT (deep venous thrombosis) ([place]) 2015   very small; treated with compression and heat, no other problems since  . GERD (gastroesophageal reflux disease)    patient denies states she does have hx of peptic ulcer many years  . Hepatitis    in 10 th grade  (not sure type- not hep C) - treated  no other problems  . Hyperlipidemia    diet controlled  . MVP (mitral valve prolapse)    no problems, MD told patient she could not hear it  . Osteopenia    [facility]  . PONV (postoperative nausea and vomiting)    with ear surgery only - No other problems with other surgeries  . Precancerous lesion 12/2014   Treated  with cream only  . Varicose veins    Past Surgical History:  Procedure Laterality Date  . APPENDECTOMY    . BUNIONECTOMY WITH HAMMERTOE RECONSTRUCTION Left 04/22/2016   Procedure: MODIFIED MCBRIDE BUNIONECTOMY,  LEFT SECOND HAMMERTOE CORRECTION;  Surgeon: Wylene Simmer, MD;  Location: [facility];  Service: Orthopedics;  Laterality: Left;  . COLONOSCOPY     07/2012 Olevia Perches - [facility]/father - polyps  . colonoscopy with polypectomy  07/2012    hyperplastic;Dr Olevia Perches  . DILATION AND CURETTAGE OF UTERUS    . DILATION AND CURETTAGE OF UTERUS N/A 09/03/2014   Procedure: DILATATION AND CURETTAGE;  Surgeon: Harle Battiest, MD;  Location: [facility];  Service: Gynecology;  Laterality: N/A;  . ENDOVENOUS  ABLATION SAPHENOUS VEIN W/ LASER Left 01-31-2014   EVLA  left greater saphenous vein  by Curt Jews MD  . ENDOVENOUS ABLATION SAPHENOUS VEIN W/ LASER Right 03-14-2014   endovenous laser ablation right greater saphenous vein  by Curt Jews MD  . FOOT ARTHRODESIS Left 04/22/2016   Procedure: LEFT FIRST TARSAL METATARSAL ARTHRODESIS;  Surgeon: Wylene Simmer, MD;  Location: [facility];  Service: Orthopedics;  Laterality: Left;  Marland Kitchen G 3 P 3    . HYSTEROSCOPY W/D&C N/A 03/25/2016   Procedure: DILATATION AND CURETTAGE /HYSTEROSCOPY;  Surgeon: Jerelyn Charles, MD;  Location: [facility];  Service: Gynecology;  Laterality: N/A;  WITH ULTRASOUND GUIDANCE  . left ear surgery    . STAPEDECTOMY    . TARSAL METATARSAL FUSION WITH WEIL OSTEOTOMY Left 04/22/2016   Procedure: 2-4 TARSAL METATARSAL FUSION WITH WEIL OSTEOTOMY;  Surgeon: Wylene Simmer, MD;  Location: [facility];  Service: Orthopedics;  Laterality: Left;  . TUBAL LIGATION     Family History  Problem Relation Age of Onset  . Pancreatic cancer Mother   . Miscarriages / [place] Mother   . Thyroid disease Mother        partial thyroidectomy ; S/P RAI  . Heart attack Father        in early 26s  . Colon cancer Father   . Colon cancer Paternal Grandmother   . Stroke Paternal Grandmother        > 65  . Heart attack Paternal Grandfather        in 39s  . Cancer Other        among paternal sibs (colon, breast, bone)  . Aneurysm Paternal Uncle        brain  . Ovarian cancer Maternal Aunt   . Breast cancer Paternal Aunt   . Diabetes Neg Hx   . Stomach cancer Neg Hx   . Ulcerative colitis Neg Hx    Social History   Socioeconomic History  . Marital status: Married    Spouse name: Not on file  . Number of children: Not on file  . Years of education: Not on file  . Highest education level: Not on file  Occupational History  . Occupation: retired  Scientific laboratory technician  . Financial resource strain: Not on file  . Food  insecurity    Worry: Not on file    Inability: Not on file  . Transportation needs    Medical: Not on file    Non-medical: Not on file  Tobacco Use  . Smoking status: Former Smoker    Types: Cigarettes    Quit date: 04/27/1963    Years since quitting: 55.5  . Smokeless tobacco: Never Used  . Tobacco comment: Smoked < 6 mos;<  1 pack total;quit 1965  Substance and Sexual Activity  . Alcohol use: No  . Drug use: No  . Sexual activity: Yes    Birth control/protection: Post-menopausal  Lifestyle  . Physical activity    Days per week: Not on file    Minutes per session: Not on file  . Stress: Not on file  Relationships  . Social Herbalist on phone: Not on file    Gets together: Not on file    Attends religious service: Not on file    Active member of club or organization: Not on file    Attends meetings of clubs or organizations: Not on file    Relationship status: Not on file  Other Topics Concern  . Not on file  Social History Narrative   Exercise-  Walks on Mondays and Wednesdays on the treadmill    Outpatient Encounter Medications as of 11/09/2018  Medication Sig  . aspirin EC 81 MG tablet Take 81 mg by mouth daily.  . cetirizine (ZYRTEC) 10 MG tablet Take 10 mg by mouth daily.  . cholecalciferol (VITAMIN D) 1000 units tablet Take 1,000 Units by mouth daily.  . Glucosamine HCl-MSM (MSM GLUCOSAMINE PO) Take 2 tablets by mouth daily.   . meclizine (ANTIVERT) 25 MG tablet Take 1 tablet (25 mg total) by mouth 3 (three) times daily as needed for dizziness. (Patient not taking: Reported on 06/06/2018)  . Multiple Vitamins-Minerals (CENTRUM SILVER PO) Take 1 tablet by mouth daily.  . Omega-3 Fatty Acids (FISH OIL) 1000 MG CAPS Take 1 capsule by mouth daily.   Marland Kitchen OVER THE COUNTER MEDICATION Take 1 tablet by mouth 3 (three) times daily. [employer] Bone Strength supplement  . triamcinolone (NASACORT) 55 MCG/ACT AERO nasal inhaler Place 2 sprays into the nose daily as needed  (allergies).   . triamcinolone cream (KENALOG) 0.1 % triamcinolone acetonide 0.1 % topical cream   No facility-administered encounter medications on file as of 11/09/2018.     Activities of Daily Living In your present state of health, do you have any difficulty performing the following activities: 10/23/2018 05/12/2018  Hearing? N N  Vision? N N  Difficulty concentrating or making decisions? N N  Walking or climbing stairs? N N  Dressing or bathing? N N  Doing errands, shopping? N N  Some recent data might be hidden    Patient Care Team: Carollee Herter, Alferd Apa, DO as PCP - General (Family Medicine) Jerelyn Charles, MD as Consulting Physician (Obstetrics) Dermatology, Donata Duff, Jori Moll, MD as Consulting Physician (Orthopedic Surgery)    Assessment:   This is a routine wellness examination for [place]. Physical assessment deferred to PCP.  Exercise Activities and Dietary recommendations   Diet (meal preparation, eat out, water intake, caffeinated beverages, dairy products, fruits and vegetables): {Desc; diets:16563} Breakfast: Lunch:  Dinner:      Goals    . Exercise 150 minutes per week (moderate activity)     Goal is to start walking again at the y        Fall Risk Fall Risk  05/12/2018 11/07/2017 07/29/2016 06/30/2015 04/24/2015  Falls in the past year? 0 No No No No  Number falls in past yr: - - - - -  Comment - - - - -  Injury with Fall? - - - - -  Follow up Falls evaluation completed - - - -     Depression Screen PHQ 2/9 Scores 05/12/2018 11/07/2017 08/05/2017 07/29/2016  PHQ - 2 Score 0  0 4 0  PHQ- 9 Score - - 8 -     Cognitive Function Ad8 score reviewed for issues:  Issues making decisions:  Less interest in hobbies / activities:  Repeats questions, stories (family complaining):  Trouble using ordinary gadgets (microwave, computer, phone):  Forgets the month or year:   Mismanaging finances:   Remembering appts:  Daily problems with thinking  and/or memory: Ad8 score is=     MMSE - Mini Mental State Exam 07/29/2016 04/24/2015  Not completed: - (No Data)  Orientation to time 5 -  Orientation to Place 5 -  Registration 3 -  Attention/ Calculation 5 -  Recall 3 -  Language- name 2 objects 2 -  Language- repeat 1 -  Language- follow 3 step command 3 -  Language- read & follow direction 1 -  Write a sentence 1 -  Copy design 1 -  Total score 30 -        Immunization History  Administered Date(s) Administered  . Influenza Whole 05/16/2012  . Influenza, High Dose Seasonal PF 02/21/2014, 01/19/2016, 01/28/2018  . Influenza-Unspecified 01/29/2015, 02/01/2017  . Pneumococcal Conjugate-13 01/08/2015  . Pneumococcal Polysaccharide-23 06/11/2011  . Td 01/25/2008  . Tdap 06/27/2018  . Zoster 12/13/2012  . Zoster Recombinat (Shingrix) 08/02/2016, 10/01/2016   Screening Tests Health Maintenance  Topic Date Due  . MAMMOGRAM  10/17/2016  . INFLUENZA VACCINE  11/25/2018  . DEXA SCAN  06/24/2019  . COLONOSCOPY  06/23/2023  . TETANUS/TDAP  06/26/2028  . Hepatitis C Screening  Completed  . PNA vac Low Risk Adult  Completed      Plan:   ***   I have personally reviewed and noted the following in the patient's chart:   . Medical and social history . Use of alcohol, tobacco or illicit drugs  . Current medications and supplements . Functional ability and status . Nutritional status . Physical activity . Advanced directives . List of other physicians . Hospitalizations, surgeries, and ER visits in previous 12 months . Vitals . Screenings to include cognitive, depression, and falls . Referrals and appointments  In addition, I have reviewed and discussed with patient certain preventive protocols, quality metrics, and best practice recommendations. A written personalized care plan for preventive services as well as general preventive health recommendations were provided to patient.     Shela Nevin, [place]   11/08/2018

## 2018-11-09 ENCOUNTER — Ambulatory Visit: Payer: Medicare Other | Admitting: *Deleted

## 2018-11-16 ENCOUNTER — Emergency Department (HOSPITAL_COMMUNITY): Payer: Medicare Other

## 2018-11-16 ENCOUNTER — Other Ambulatory Visit: Payer: Self-pay

## 2018-11-16 ENCOUNTER — Encounter (HOSPITAL_COMMUNITY): Payer: Self-pay

## 2018-11-16 ENCOUNTER — Emergency Department (HOSPITAL_COMMUNITY)
Admission: EM | Admit: 2018-11-16 | Discharge: 2018-11-16 | Disposition: A | Discharge status: Home / Self Care | Payer: Medicare Other | Attending: Emergency Medicine | Admitting: Emergency Medicine

## 2018-11-16 DIAGNOSIS — Z87891 Personal history of nicotine dependence: Secondary | ICD-10-CM | POA: Diagnosis not present

## 2018-11-16 DIAGNOSIS — Z7982 Long term (current) use of aspirin: Secondary | ICD-10-CM | POA: Insufficient documentation

## 2018-11-16 DIAGNOSIS — S3992XA Unspecified injury of lower back, initial encounter: Secondary | ICD-10-CM | POA: Diagnosis present

## 2018-11-16 DIAGNOSIS — S32131A Minimally displaced Zone III fracture of sacrum, initial encounter for closed fracture: Secondary | ICD-10-CM | POA: Diagnosis not present

## 2018-11-16 DIAGNOSIS — Y92410 Unspecified street and highway as the place of occurrence of the external cause: Secondary | ICD-10-CM | POA: Diagnosis not present

## 2018-11-16 DIAGNOSIS — Y999 Unspecified external cause status: Secondary | ICD-10-CM | POA: Insufficient documentation

## 2018-11-16 DIAGNOSIS — R51 Headache: Secondary | ICD-10-CM | POA: Insufficient documentation

## 2018-11-16 DIAGNOSIS — Y9389 Activity, other specified: Secondary | ICD-10-CM | POA: Diagnosis not present

## 2018-11-16 DIAGNOSIS — M542 Cervicalgia: Secondary | ICD-10-CM | POA: Diagnosis not present

## 2018-11-16 DIAGNOSIS — Z79899 Other long term (current) drug therapy: Secondary | ICD-10-CM | POA: Diagnosis not present

## 2018-11-16 DIAGNOSIS — W19XXXA Unspecified fall, initial encounter: Secondary | ICD-10-CM

## 2018-11-16 MED ORDER — LIDOCAINE 5 % EX PTCH: 1.0000 | MEDICATED_PATCH | CUTANEOUS | 0 refills | Status: DC

## 2018-11-16 MED ORDER — TRAMADOL HCL 50 MG PO TABS: 50.0000 mg | ORAL_TABLET | Freq: Four times a day (QID) | ORAL | 0 refills | Status: DC | PRN

## 2018-11-16 NOTE — Discharge Instructions (Addendum)
You have been diagnosed with a sacral fracture of S3-S4 without signs of other pelvic fractures.  You may weight bear on this injury as tolerated and follow up with Orthopedics as it should heal on its own over time.  If you develop numbness, weakness, loss of control of bowel or bladder or difficulty urinating, please return to the ED for further care.

## 2018-11-16 NOTE — ED Provider Notes (Signed)
[facility] DEPT Provider Note   CSN: [id number] Arrival date & time: 11/16/18  1316    History   Chief Complaint Chief Complaint  Patient presents with  . Fall    HPI Kim Mclaughlin is a 73 y.o. female.     HPI  73yo female with history of diet controlled hyperlipidemia, MVP, varicose veins, arthritis, presents with concern for fall.  She was getting into her husband's truck when thte handle broke and she fell back, hitting her back and her head. Has neck pain. No LOC, no lacerations.  Reports tailbone pain, most severe when she changes positions and sits. Denies numbness, weakness, difficulty urinating or stool incontinence. Denies other injuries or pain. No CP/abdominal pain/dyspnea/nausea.  Believes she fell approx 2 feet onto cement. Occurred about 2 hr ago.   Past Medical History:  Diagnosis Date  . Allergy   . Arthritis 07/2014   Right hand, prescribed prednisone-no longer taking. also Rt knww  . DVT (deep venous thrombosis) ([place]) 2015   very small; treated with compression and heat, no other problems since  . GERD (gastroesophageal reflux disease)    patient denies states she does have hx of peptic ulcer many years  . Hepatitis    in 10 th grade  (not sure type- not hep C) - treated  no other problems  . Hyperlipidemia    diet controlled  . MVP (mitral valve prolapse)    no problems, MD told patient she could not hear it  . Osteopenia    [facility]  . PONV (postoperative nausea and vomiting)    with ear surgery only - No other problems with other surgeries  . Precancerous lesion 12/2014   Treated with cream only  . Varicose veins     Patient Active Problem List   Diagnosis Date Noted  . Depression, major, single episode, moderate ([place]) 08/05/2017  . Loss of transverse plantar arch 10/10/2015  . Arthritis of foot, degenerative 07/16/2015  . Wellness examination 06/30/2015  . Varicose veins of lower extremities with complications  38/75/6433  . Varicose veins of lower extremities with other complications 29/51/8841  . Chronic venous insufficiency 10/05/2013  . Hyperplastic colonic polyp 08/15/2013  . Unspecified adverse effect of unspecified drug, medicinal and biological substance 07/05/2011  . ARTHRALGIA 10/13/2009  . HEMIANOPSIA, HOMONYMOUS, LEFT 10/24/2008  . VARICOSE VEINS, LOWER EXTREMITIES 10/24/2008  . Osteoporosis 02/29/2008  . Hyperlipidemia 07/25/2007  . MITRAL VALVE PROLAPSE 05/09/2007  . ABNORMAL HEART RHYTHMS 05/09/2007  . ADD 02/28/2007    Past Surgical History:  Procedure Laterality Date  . APPENDECTOMY    . BUNIONECTOMY WITH HAMMERTOE RECONSTRUCTION Left 04/22/2016   Procedure: MODIFIED MCBRIDE BUNIONECTOMY,  LEFT SECOND HAMMERTOE CORRECTION;  Surgeon: Wylene Simmer, MD;  Location: [facility];  Service: Orthopedics;  Laterality: Left;  . COLONOSCOPY     07/2012 Olevia Perches - [facility]/father - polyps  . colonoscopy with polypectomy  07/2012    hyperplastic;Dr Olevia Perches  . DILATION AND CURETTAGE OF UTERUS    . DILATION AND CURETTAGE OF UTERUS N/A 09/03/2014   Procedure: DILATATION AND CURETTAGE;  Surgeon: Harle Battiest, MD;  Location: [facility];  Service: Gynecology;  Laterality: N/A;  . ENDOVENOUS ABLATION SAPHENOUS VEIN W/ LASER Left 01-31-2014   EVLA  left greater saphenous vein  by Curt Jews MD  . ENDOVENOUS ABLATION SAPHENOUS VEIN W/ LASER Right 03-14-2014   endovenous laser ablation right greater saphenous vein  by Curt Jews MD  . FOOT  ARTHRODESIS Left 04/22/2016   Procedure: LEFT FIRST TARSAL METATARSAL ARTHRODESIS;  Surgeon: Wylene Simmer, MD;  Location: [facility];  Service: Orthopedics;  Laterality: Left;  Marland Kitchen G 3 P 3    . HYSTEROSCOPY W/D&C N/A 03/25/2016   Procedure: DILATATION AND CURETTAGE /HYSTEROSCOPY;  Surgeon: Jerelyn Charles, MD;  Location: [facility];  Service: Gynecology;  Laterality: N/A;  WITH ULTRASOUND GUIDANCE  . left ear surgery    . STAPEDECTOMY     . TARSAL METATARSAL FUSION WITH WEIL OSTEOTOMY Left 04/22/2016   Procedure: 2-4 TARSAL METATARSAL FUSION WITH WEIL OSTEOTOMY;  Surgeon: Wylene Simmer, MD;  Location: [facility];  Service: Orthopedics;  Laterality: Left;  . TUBAL LIGATION       OB History   No obstetric history on file.      Home Medications    Prior to Admission medications   Medication Sig Start Date End Date Taking? Authorizing Provider  aspirin EC 81 MG tablet Take 81 mg by mouth daily.   Yes [provider]  cetirizine (ZYRTEC) 10 MG tablet Take 10 mg by mouth daily as needed for allergies.    Yes [provider]  cholecalciferol (VITAMIN D) 1000 units tablet Take 1,000 Units by mouth daily.   Yes [provider]  ECHINACEA EXTRACT PO Take 3 tablets by mouth daily.   Yes [provider]  ELDERBERRY PO Take 2 lozenges by mouth daily.   Yes [provider]  Glucosamine HCl-MSM (MSM GLUCOSAMINE PO) Take 2 tablets by mouth daily.    Yes [provider]  Multiple Vitamins-Minerals (CENTRUM SILVER PO) Take 1 tablet by mouth daily.   Yes [provider]  Omega-3 Fatty Acids (FISH OIL) 1000 MG CAPS Take 1 capsule by mouth daily.    Yes [provider]  OVER THE COUNTER MEDICATION Take 1 tablet by mouth 3 (three) times daily. [employer] Bone Strength supplement   Yes [provider]  triamcinolone (NASACORT) 55 MCG/ACT AERO nasal inhaler Place 2 sprays into the nose daily as needed (allergies).    Yes [provider]  triamcinolone cream (KENALOG) 0.1 % Apply 1 application topically 2 (two) times daily as needed (eczema.).    Yes [provider]  lidocaine (LIDODERM) 5 % Place 1 patch onto the skin daily. Remove & Discard patch within 12 hours or as directed by MD 11/16/18   Gareth Morgan, MD  meclizine (ANTIVERT) 25 MG tablet Take 1 tablet (25 mg total) by mouth 3 (three) times daily as needed for dizziness. Patient  not taking: Reported on 06/06/2018 12/24/17   Billie Ruddy, MD  traMADol (ULTRAM) 50 MG tablet Take 1 tablet (50 mg total) by mouth every 6 (six) hours as needed. 11/16/18   Gareth Morgan, MD    Family History Family History  Problem Relation Age of Onset  . Pancreatic cancer Mother   . Miscarriages / [place] Mother   . Thyroid disease Mother        partial thyroidectomy ; S/P RAI  . Heart attack Father        in early 12s  . Colon cancer Father   . Colon cancer Paternal Grandmother   . Stroke Paternal Grandmother        > 65  . Heart attack Paternal Grandfather        in 89s  . Cancer Other        among paternal sibs (colon, breast, bone)  . Aneurysm Paternal Uncle  brain  . Ovarian cancer Maternal Aunt   . Breast cancer Paternal Aunt   . Diabetes Neg Hx   . Stomach cancer Neg Hx   . Ulcerative colitis Neg Hx     Social History Social History   Tobacco Use  . Smoking status: Former Smoker    Types: Cigarettes    Quit date: 04/27/1963    Years since quitting: 55.5  . Smokeless tobacco: Never Used  . Tobacco comment: Smoked < 6 mos;< 1 pack total;quit 1965  Substance Use Topics  . Alcohol use: No  . Drug use: No     Allergies   Macrobid  [nitrofurantoin macrocrystal], Doxycycline, Pravastatin sodium, Amoxicillin-pot clavulanate, Augmentin [amoxicillin-pot clavulanate], Codeine, Methylphenidate hcl, Methylprednisolone, and Sulfamethoxazole-trimethoprim   Review of Systems Review of Systems  Constitutional: Negative for fever.  HENT: Negative for sore throat.   Eyes: Negative for visual disturbance.  Respiratory: Negative for cough and shortness of breath.   Cardiovascular: Negative for chest pain.  Gastrointestinal: Negative for abdominal pain, nausea and vomiting.  Genitourinary: Negative for difficulty urinating.  Musculoskeletal: Positive for back pain (buttock/tailbone pain) and neck pain.  Skin: Negative for wound.  Neurological: Positive  for headaches. Negative for syncope.     Physical Exam Updated Vital Signs BP (!) 169/88 (BP Location: Right Arm)   Pulse 85   Temp 98.6 F (37 C) (Oral)   Resp 18   Ht 5' 3.5" (1.613 m)   Wt 66.2 kg   SpO2 98%   BMI 25.46 kg/m   Physical Exam Vitals signs and nursing note reviewed.  Constitutional:      General: She is not in acute distress.    Appearance: She is well-developed. She is not diaphoretic.  HENT:     Head: Normocephalic and atraumatic.  Eyes:     Conjunctiva/sclera: Conjunctivae normal.  Neck:     Musculoskeletal: Normal range of motion.  Cardiovascular:     Rate and Rhythm: Normal rate and regular rhythm.     Pulses: Normal pulses.  Pulmonary:     Effort: Pulmonary effort is normal. No respiratory distress.  Abdominal:     General: There is no distension.     Palpations: Abdomen is soft.     Tenderness: There is no abdominal tenderness. There is no guarding.  Musculoskeletal:     Cervical back: She exhibits tenderness. Bony tenderness: reports mild "soreness"     Thoracic back: She exhibits no bony tenderness.     Lumbar back: She exhibits bony tenderness (sacral/coccyx).  Skin:    General: Skin is warm and dry.     Findings: No erythema or rash.  Neurological:     Mental Status: She is alert and oriented to person, place, and time.      ED Treatments / Results  Labs (all labs ordered are listed, but only abnormal results are displayed) Labs Reviewed - No data to display  EKG None  Radiology Dg Sacrum/coccyx  Result Date: 11/16/2018 CLINICAL DATA:  Pt c/o sacrum/coccyx pain s/p fall this afternoon. EXAM: SACRUM AND COCCYX - 2+ VIEW COMPARISON:  11/16/2018 pelvis FINDINGS: There is an acute fracture of the mid sacrum, at the level of S3-4. Fracture appears minimally displaced. IMPRESSION: Fracture of the mid sacrum. These results will be called to the ordering clinician or representative by the Radiologist Assistant, and communication  documented in the PACS or zVision Dashboard. Electronically Signed   By: Nolon Nations M.D.   On: 11/16/2018 15:25   Ct  Head Wo Contrast  Result Date: 11/16/2018 CLINICAL DATA:  Fall, head trauma, neck pain EXAM: CT HEAD WITHOUT CONTRAST CT CERVICAL SPINE WITHOUT CONTRAST TECHNIQUE: Multidetector CT imaging of the head and cervical spine was performed following the standard protocol without intravenous contrast. Multiplanar CT image reconstructions of the cervical spine were also generated. COMPARISON:  None. FINDINGS: CT HEAD FINDINGS Brain: No evidence of acute infarction, hemorrhage, hydrocephalus, extra-axial collection or mass lesion/mass effect. Vascular: Mild scattered atherosclerotic calcifications at the skull base. No unexpected hyperdense vessel. Skull: Negative for fracture or focal lesion. Sinuses/Orbits: No acute finding. Other: None. CT CERVICAL SPINE FINDINGS Alignment: Grade 1 anterolisthesis C4 on C5 and C7 on T1, likely degenerative. Skull base and vertebrae: No acute fracture. No destructive bone lesion. Partial osseous fusion of the posterior elements of the C3-C5 levels. There is also partial osseous fusion across the left C6-7 facet joint. Soft tissues and spinal canal: No prevertebral fluid or swelling. No visible canal hematoma. Disc levels: Multilevel degenerative changes including disc height loss and anterior endplate spurring, most pronounced at the C6-7 level. Moderate-advanced bilateral facet arthropathy throughout the spine. Chronic appearing irregularity of the right C2-3 facet joint with partially sclerotic borders. Upper chest: Visualized lung apices clear. Other: None. IMPRESSION: 1. No acute intracranial process. 2. No acute fracture of the cervical spine. 3. Partial osseous fusion of the posterior elements of multiple C-spine levels. Chronic appearing irregularity of the right C2-C3 facet joint, which may represent sequela of prior trauma. Electronically Signed   By:  Davina Poke M.D.   On: 11/16/2018 16:22   Ct Cervical Spine Wo Contrast  Result Date: 11/16/2018 CLINICAL DATA:  Fall, head trauma, neck pain EXAM: CT HEAD WITHOUT CONTRAST CT CERVICAL SPINE WITHOUT CONTRAST TECHNIQUE: Multidetector CT imaging of the head and cervical spine was performed following the standard protocol without intravenous contrast. Multiplanar CT image reconstructions of the cervical spine were also generated. COMPARISON:  None. FINDINGS: CT HEAD FINDINGS Brain: No evidence of acute infarction, hemorrhage, hydrocephalus, extra-axial collection or mass lesion/mass effect. Vascular: Mild scattered atherosclerotic calcifications at the skull base. No unexpected hyperdense vessel. Skull: Negative for fracture or focal lesion. Sinuses/Orbits: No acute finding. Other: None. CT CERVICAL SPINE FINDINGS Alignment: Grade 1 anterolisthesis C4 on C5 and C7 on T1, likely degenerative. Skull base and vertebrae: No acute fracture. No destructive bone lesion. Partial osseous fusion of the posterior elements of the C3-C5 levels. There is also partial osseous fusion across the left C6-7 facet joint. Soft tissues and spinal canal: No prevertebral fluid or swelling. No visible canal hematoma. Disc levels: Multilevel degenerative changes including disc height loss and anterior endplate spurring, most pronounced at the C6-7 level. Moderate-advanced bilateral facet arthropathy throughout the spine. Chronic appearing irregularity of the right C2-3 facet joint with partially sclerotic borders. Upper chest: Visualized lung apices clear. Other: None. IMPRESSION: 1. No acute intracranial process. 2. No acute fracture of the cervical spine. 3. Partial osseous fusion of the posterior elements of multiple C-spine levels. Chronic appearing irregularity of the right C2-C3 facet joint, which may represent sequela of prior trauma. Electronically Signed   By: Davina Poke M.D.   On: 11/16/2018 16:22   Dg Pelvis Comp Min  3v  Result Date: 11/16/2018 CLINICAL DATA:  Sacrum and coccyx pain after falling this afternoon. EXAM: [facility] PELVIS - 3+ VIEW COMPARISON:  None. FINDINGS: No acute fracture or subluxation. Degenerative changes are seen in the LOWER lumbar spine. IMPRESSION: No evidence for acute abnormality. Electronically Signed  By: Nolon Nations M.D.   On: 11/16/2018 15:23    Procedures Procedures (including critical care time)  Medications Ordered in ED Medications - No data to display   Initial Impression / Assessment and Plan / ED Course  I have reviewed the triage vital signs and the nursing notes.  Pertinent labs & imaging results that were available during my care of the patient were reviewed by me and considered in my medical decision making (see chart for details).        73yo female with history of diet controlled hyperlipidemia, MVP, varicose veins, arthritis, presents with concern for fall from pickup truck with headache and tailbone pain.  CT head and CSpine without acute abnormalities.  XR pelvis and sacrum shows sacral fracture S3-S4 with minimal displacement. Pt NV intact, normal strength, ambulatory in room.  Recommend WBAT, follow up with Orthopedics in a few weeks.  Looked up in [place] and discussed risks of narcotic rx. Gave rx for tramadol and lidoderm, pt to fill tramadol rx prn. Patient discharged in stable condition with understanding of reasons to return.    Final Clinical Impressions(s) / ED Diagnoses   Final diagnoses:  Fall  Closed minimally displaced zone III fracture of sacrum, initial encounter [facility])    ED Discharge Orders         Ordered    traMADol (ULTRAM) 50 MG tablet  Every 6 hours PRN     11/16/18 1641    lidocaine (LIDODERM) 5 %  Every 24 hours     11/16/18 1641           Gareth Morgan, MD 11/17/18 1535

## 2018-11-16 NOTE — ED Triage Notes (Signed)
Pt states she was getting in her husbands truck when the handle broke. Pt states she hit her back and head. Pt c/o neck pain as well. No LOC, no bleeding.

## 2018-11-30 ENCOUNTER — Other Ambulatory Visit: Payer: Self-pay

## 2018-11-30 ENCOUNTER — Ambulatory Visit (INDEPENDENT_AMBULATORY_CARE_PROVIDER_SITE_OTHER): Payer: Medicare Other | Admitting: Orthopaedic Surgery

## 2018-11-30 ENCOUNTER — Encounter: Payer: Self-pay | Admitting: Orthopaedic Surgery

## 2018-11-30 DIAGNOSIS — S3210XA Unspecified fracture of sacrum, initial encounter for closed fracture: Secondary | ICD-10-CM | POA: Diagnosis not present

## 2018-11-30 NOTE — Progress Notes (Signed)
Office Visit Note   Patient: Kim Mclaughlin           Date of Birth: 1945-09-29           MRN: [id number] Visit Date: 11/30/2018              Requested by: [address] [place], [place] [address] [place],  [place] [address] PCP: Carollee Herter, Alferd Apa, DO   Assessment & Plan: Visit Diagnoses:  1. Closed fracture of sacrum, unspecified fracture morphology, initial encounter [facility])     Plan: Impression is sacral fracture at S3-4.  The patient will continue with conservative treatment to include over-the-counter medications as needed as well as the use of a donut when seated.  We have discussed the likelihood of this taking 3 to 6 months to completely heal.  She will follow-up with [place] as needed.  She will call with concerns or questions in the meantime.  Follow-Up Instructions: Return if symptoms worsen or fail to improve.   Orders:  No orders of the defined types were placed in this encounter.  No orders of the defined types were placed in this encounter.     Procedures: No procedures performed   Clinical Data: No additional findings.   Subjective: Chief Complaint  Patient presents with  . Pelvis - Injury    HPI patient is a pleasant 73 year old female who presents our clinic today for follow-up of a sacrum fracture.  This occurred on 11/16/2018.  She was grabbing handlebars she was getting in her husband's truck when the handlebar broke and caused her to fall directly onto her buttocks.  She was seen in the ED where x-rays were obtained.  X-rays showed a fracture to S3-4.  She comes in today for further evaluation and treatment recommendation.  She has been using a doughnut when she is in a seated position which has dramatically helped.  She has been resting as well.  She is able to ambulate without significant difficulty.  Overall, she is feeling much better.  She does note an occasional soreness with sitting but nothing more.  Review of Systems as detailed  in HPI.  All others reviewed and are negative.   Objective: Vital Signs: There were no vitals taken for this visit.  Physical Exam well-developed well-nourished female no acute distress.  Alert and oriented x3.  Ortho Exam examination of her buttocks reveals mild tenderness along the proximal gluteal cleft.  She is neurovascular intact distally.  Specialty Comments:  No specialty comments available.  Imaging: No new imaging   PMFS History: Patient Active Problem List   Diagnosis Date Noted  . Depression, major, single episode, moderate ([place]) 08/05/2017  . Loss of transverse plantar arch 10/10/2015  . Arthritis of foot, degenerative 07/16/2015  . Wellness examination 06/30/2015  . Varicose veins of lower extremities with complications 38/01/1750  . Varicose veins of lower extremities with other complications 02/58/5277  . Chronic venous insufficiency 10/05/2013  . Hyperplastic colonic polyp 08/15/2013  . Unspecified adverse effect of unspecified drug, medicinal and biological substance 07/05/2011  . ARTHRALGIA 10/13/2009  . HEMIANOPSIA, HOMONYMOUS, LEFT 10/24/2008  . VARICOSE VEINS, LOWER EXTREMITIES 10/24/2008  . Osteoporosis 02/29/2008  . Hyperlipidemia 07/25/2007  . MITRAL VALVE PROLAPSE 05/09/2007  . ABNORMAL HEART RHYTHMS 05/09/2007  . ADD 02/28/2007   Past Medical History:  Diagnosis Date  . Allergy   . Arthritis 07/2014   Right hand, prescribed prednisone-no longer taking. also Rt knww  .  DVT (deep venous thrombosis) ([place]) 2015   very small; treated with compression and heat, no other problems since  . GERD (gastroesophageal reflux disease)    patient denies states she does have hx of peptic ulcer many years  . Hepatitis    in 10 th grade  (not sure type- not hep C) - treated  no other problems  . Hyperlipidemia    diet controlled  . MVP (mitral valve prolapse)    no problems, MD told patient she could not hear it  . Osteopenia    [facility]  . PONV  (postoperative nausea and vomiting)    with ear surgery only - No other problems with other surgeries  . Precancerous lesion 12/2014   Treated with cream only  . Varicose veins     Family History  Problem Relation Age of Onset  . Pancreatic cancer Mother   . Miscarriages / [place] Mother   . Thyroid disease Mother        partial thyroidectomy ; S/P RAI  . Heart attack Father        in early 11s  . Colon cancer Father   . Colon cancer Paternal Grandmother   . Stroke Paternal Grandmother        > 65  . Heart attack Paternal Grandfather        in 33s  . Cancer Other        among paternal sibs (colon, breast, bone)  . Aneurysm Paternal Uncle        brain  . Ovarian cancer Maternal Aunt   . Breast cancer Paternal Aunt   . Diabetes Neg Hx   . Stomach cancer Neg Hx   . Ulcerative colitis Neg Hx     Past Surgical History:  Procedure Laterality Date  . APPENDECTOMY    . BUNIONECTOMY WITH HAMMERTOE RECONSTRUCTION Left 04/22/2016   Procedure: MODIFIED MCBRIDE BUNIONECTOMY,  LEFT SECOND HAMMERTOE CORRECTION;  Surgeon: Wylene Simmer, MD;  Location: [facility];  Service: Orthopedics;  Laterality: Left;  . COLONOSCOPY     07/2012 Olevia Perches - [facility]/father - polyps  . colonoscopy with polypectomy  07/2012    hyperplastic;Dr Olevia Perches  . DILATION AND CURETTAGE OF UTERUS    . DILATION AND CURETTAGE OF UTERUS N/A 09/03/2014   Procedure: DILATATION AND CURETTAGE;  Surgeon: Harle Battiest, MD;  Location: [facility];  Service: Gynecology;  Laterality: N/A;  . ENDOVENOUS ABLATION SAPHENOUS VEIN W/ LASER Left 01-31-2014   EVLA  left greater saphenous vein  by Curt Jews MD  . ENDOVENOUS ABLATION SAPHENOUS VEIN W/ LASER Right 03-14-2014   endovenous laser ablation right greater saphenous vein  by Curt Jews MD  . FOOT ARTHRODESIS Left 04/22/2016   Procedure: LEFT FIRST TARSAL METATARSAL ARTHRODESIS;  Surgeon: Wylene Simmer, MD;  Location: [facility];  Service:  Orthopedics;  Laterality: Left;  Marland Kitchen G 3 P 3    . HYSTEROSCOPY W/D&C N/A 03/25/2016   Procedure: DILATATION AND CURETTAGE /HYSTEROSCOPY;  Surgeon: Jerelyn Charles, MD;  Location: [facility];  Service: Gynecology;  Laterality: N/A;  WITH ULTRASOUND GUIDANCE  . left ear surgery    . STAPEDECTOMY    . TARSAL METATARSAL FUSION WITH WEIL OSTEOTOMY Left 04/22/2016   Procedure: 2-4 TARSAL METATARSAL FUSION WITH WEIL OSTEOTOMY;  Surgeon: Wylene Simmer, MD;  Location: [facility];  Service: Orthopedics;  Laterality: Left;  . TUBAL LIGATION     Social History   Occupational History  . Occupation: retired  Tobacco Use  . Smoking status: Former Smoker    Types: Cigarettes    Quit date: 04/27/1963    Years since quitting: 55.6  . Smokeless tobacco: Never Used  . Tobacco comment: Smoked < 6 mos;< 1 pack total;quit 1965  Substance and Sexual Activity  . Alcohol use: No  . Drug use: No  . Sexual activity: Yes    Birth control/protection: Post-menopausal

## 2019-01-17 ENCOUNTER — Other Ambulatory Visit: Payer: Self-pay

## 2019-01-17 ENCOUNTER — Ambulatory Visit (INDEPENDENT_AMBULATORY_CARE_PROVIDER_SITE_OTHER): Payer: Medicare Other

## 2019-01-17 DIAGNOSIS — Z23 Encounter for immunization: Secondary | ICD-10-CM

## 2019-01-17 NOTE — Progress Notes (Signed)
Patient came in to have her high dose flu shot per Dr. Etter Sjogren

## 2019-03-21 ENCOUNTER — Encounter: Payer: Self-pay | Admitting: Family Medicine

## 2019-04-11 ENCOUNTER — Encounter: Payer: Self-pay | Admitting: Family Medicine

## 2019-05-07 ENCOUNTER — Encounter: Payer: Self-pay | Admitting: Family Medicine

## 2019-05-08 DIAGNOSIS — M1711 Unilateral primary osteoarthritis, right knee: Secondary | ICD-10-CM | POA: Diagnosis not present

## 2019-06-20 ENCOUNTER — Encounter: Payer: Self-pay | Admitting: Family Medicine

## 2019-06-20 DIAGNOSIS — M810 Age-related osteoporosis without current pathological fracture: Secondary | ICD-10-CM

## 2019-06-21 NOTE — Telephone Encounter (Signed)
yes

## 2019-06-22 ENCOUNTER — Ambulatory Visit: Payer: Medicare PPO

## 2019-06-29 DIAGNOSIS — Z780 Asymptomatic menopausal state: Secondary | ICD-10-CM | POA: Diagnosis not present

## 2019-06-29 LAB — HM DEXA SCAN

## 2019-07-04 ENCOUNTER — Ambulatory Visit: Payer: Medicare PPO | Attending: Internal Medicine

## 2019-07-04 DIAGNOSIS — Z20822 Contact with and (suspected) exposure to covid-19: Secondary | ICD-10-CM

## 2019-07-05 LAB — NOVEL CORONAVIRUS, NAA: SARS-CoV-2, NAA: NOT DETECTED

## 2019-08-03 DIAGNOSIS — M1711 Unilateral primary osteoarthritis, right knee: Secondary | ICD-10-CM | POA: Diagnosis not present

## 2019-09-03 DIAGNOSIS — M1711 Unilateral primary osteoarthritis, right knee: Secondary | ICD-10-CM | POA: Diagnosis not present

## 2019-09-10 DIAGNOSIS — M25561 Pain in right knee: Secondary | ICD-10-CM | POA: Diagnosis not present

## 2019-09-10 DIAGNOSIS — M1711 Unilateral primary osteoarthritis, right knee: Secondary | ICD-10-CM | POA: Diagnosis not present

## 2019-09-17 DIAGNOSIS — M1711 Unilateral primary osteoarthritis, right knee: Secondary | ICD-10-CM | POA: Diagnosis not present

## 2019-10-16 DIAGNOSIS — M13841 Other specified arthritis, right hand: Secondary | ICD-10-CM | POA: Diagnosis not present

## 2019-10-18 ENCOUNTER — Encounter: Payer: Self-pay | Admitting: Family Medicine

## 2019-10-18 ENCOUNTER — Other Ambulatory Visit: Payer: Self-pay

## 2019-10-18 ENCOUNTER — Ambulatory Visit (INDEPENDENT_AMBULATORY_CARE_PROVIDER_SITE_OTHER): Payer: Medicare PPO | Admitting: Family Medicine

## 2019-10-18 VITALS — BP 150/82 | HR 72 | Temp 97.7°F | Resp 16 | Ht 63.0 in | Wt 141.0 lb

## 2019-10-18 DIAGNOSIS — Z0000 Encounter for general adult medical examination without abnormal findings: Secondary | ICD-10-CM

## 2019-10-18 LAB — CBC WITH DIFFERENTIAL/PLATELET
Basophils Absolute: 0 10*3/uL (ref 0.0–0.1)
Basophils Relative: 0.7 % (ref 0.0–3.0)
Eosinophils Absolute: 0.1 10*3/uL (ref 0.0–0.7)
Eosinophils Relative: 2 % (ref 0.0–5.0)
HCT: 39.7 % (ref 36.0–46.0)
Hemoglobin: 13.5 g/dL (ref 12.0–15.0)
Lymphocytes Relative: 36.1 % (ref 12.0–46.0)
Lymphs Abs: 2.4 10*3/uL (ref 0.7–4.0)
MCHC: 34 g/dL (ref 30.0–36.0)
MCV: 94 fl (ref 78.0–100.0)
Monocytes Absolute: 0.5 10*3/uL (ref 0.1–1.0)
Monocytes Relative: 7.9 % (ref 3.0–12.0)
Neutro Abs: 3.5 10*3/uL (ref 1.4–7.7)
Neutrophils Relative %: 53.3 % (ref 43.0–77.0)
Platelets: 307 10*3/uL (ref 150.0–400.0)
RBC: 4.22 Mil/uL (ref 3.87–5.11)
RDW: 13 % (ref 11.5–15.5)
WBC: 6.6 10*3/uL (ref 4.0–10.5)

## 2019-10-18 LAB — LIPID PANEL
Cholesterol: 230 mg/dL — ABNORMAL HIGH (ref 0–200)
HDL: 82.4 mg/dL (ref >39.00)
LDL Cholesterol: 133 mg/dL — ABNORMAL HIGH (ref 0–99)
NonHDL: 147.79
Total CHOL/HDL Ratio: 3
Triglycerides: 73 mg/dL (ref 0.0–149.0)
VLDL: 14.6 mg/dL (ref 0.0–40.0)

## 2019-10-18 LAB — COMPREHENSIVE METABOLIC PANEL
ALT: 17 U/L (ref 0–35)
AST: 17 U/L (ref 0–37)
Albumin: 4.6 g/dL (ref 3.5–5.2)
Alkaline Phosphatase: 64 U/L (ref 39–117)
BUN: 16 mg/dL (ref 6–23)
CO2: 28 mEq/L (ref 19–32)
Calcium: 10.4 mg/dL (ref 8.4–10.5)
Chloride: 104 mEq/L (ref 96–112)
Creatinine, Ser: 0.88 mg/dL (ref 0.40–1.20)
GFR: 62.74 mL/min (ref >60.00)
Glucose, Bld: 87 mg/dL (ref 70–99)
Potassium: 4 mEq/L (ref 3.5–5.1)
Sodium: 140 mEq/L (ref 135–145)
Total Bilirubin: 0.6 mg/dL (ref 0.2–1.2)
Total Protein: 7.1 g/dL (ref 6.0–8.3)

## 2019-10-18 LAB — TSH: TSH: 1.74 u[IU]/mL (ref 0.35–4.50)

## 2019-10-18 NOTE — Progress Notes (Signed)
Subjective:     Kim Mclaughlin is a 74 y.o. female and is here for a comprehensive physical exam. The patient reports no problems.  Social History   Socioeconomic History  . Marital status: Married    Spouse name: Not on file  . Number of children: Not on file  . Years of education: Not on file  . Highest education level: Not on file  Occupational History  . Occupation: retired  Tobacco Use  . Smoking status: Former Smoker    Types: Cigarettes    Quit date: 04/27/1963    Years since quitting: 56.5  . Smokeless tobacco: Never Used  . Tobacco comment: Smoked < 6 mos;< 1 pack total;quit 1965  Vaping Use  . Vaping Use: Never used  Substance and Sexual Activity  . Alcohol use: No  . Drug use: No  . Sexual activity: Yes    Birth control/protection: Post-menopausal  Other Topics Concern  . Not on file  Social History Narrative   Exercise-  Walks on Mondays and Wednesdays on the treadmill   Social Determinants of Health   Financial Resource Strain:   . Difficulty of Paying Living Expenses:   Food Insecurity:   . Worried About Charity fundraiser in the Last Year:   . Arboriculturist in the Last Year:   Transportation Needs:   . Film/video editor (Medical):   Marland Kitchen Lack of Transportation (Non-Medical):   Physical Activity:   . Days of Exercise per Week:   . Minutes of Exercise per Session:   Stress:   . Feeling of Stress :   Social Connections:   . Frequency of Communication with Friends and Family:   . Frequency of Social Gatherings with Friends and Family:   . Attends Religious Services:   . Active Member of Clubs or Organizations:   . Attends Archivist Meetings:   Marland Kitchen Marital Status:   Intimate Partner Violence:   . Fear of Current or Ex-Partner:   . Emotionally Abused:   Marland Kitchen Physically Abused:   . Sexually Abused:    Health Maintenance  Topic Date Due  . COVID-19 Vaccine (1) Never done  . INFLUENZA VACCINE  11/25/2019  . MAMMOGRAM  03/04/2021  .  DEXA SCAN  06/28/2021  . COLONOSCOPY  06/23/2023  . TETANUS/TDAP  06/26/2028  . Hepatitis C Screening  Completed  . PNA vac Low Risk Adult  Completed    The following portions of the patient's history were reviewed and updated as appropriate:  She  has a past medical history of Allergy, Arthritis (07/2014), DVT (deep venous thrombosis) ([place]) (2015), GERD (gastroesophageal reflux disease), Hepatitis, Hyperlipidemia, MVP (mitral valve prolapse), Osteopenia, PONV (postoperative nausea and vomiting), Precancerous lesion (12/2014), and Varicose veins. She does not have any pertinent problems on file. She  has a past surgical history that includes Appendectomy; Tubal ligation; Stapedectomy; G 3 P 3; Dilation and curettage of uterus; colonoscopy with polypectomy (07/2012); Endovenous ablation saphenous vein w/ laser (Left, 01-31-2014); Endovenous ablation saphenous vein w/ laser (Right, 03-14-2014); Dilation and curettage of uterus (N/A, 09/03/2014); Hysteroscopy with D & C (N/A, 03/25/2016); Foot arthrodesis (Left, 04/22/2016); Bunionectomy with hammertoe reconstruction (Left, 04/22/2016); Tarsal metatarsal fusion with weil osteotomy (Left, 04/22/2016); Colonoscopy; and left ear surgery. Her family history includes Aneurysm in her paternal uncle; Breast cancer in her paternal aunt; Cancer in an other family member; Colon cancer in her father and paternal grandmother; Heart attack in her father and paternal grandfather; Miscarriages /  Stillbirths in her mother; Ovarian cancer in her maternal aunt; Pancreatic cancer in her mother; Stroke in her paternal grandmother; Thyroid disease in her mother. She  reports that she quit smoking about 56 years ago. Her smoking use included cigarettes. She has never used smokeless tobacco. She reports that she does not drink alcohol and does not use drugs. She has a current medication list which includes the following prescription(s): aspirin ec, cetirizine, cholecalciferol,  echinacea, glucosamine hcl-msm, lidocaine, meclizine, multiple vitamins-minerals, fish oil, OVER THE COUNTER MEDICATION, tramadol, triamcinolone, triamcinolone cream, and elderberry. Current Outpatient Medications on File Prior to Visit  Medication Sig Dispense Refill  . aspirin EC 81 MG tablet Take 81 mg by mouth daily.    . cetirizine (ZYRTEC) 10 MG tablet Take 10 mg by mouth daily as needed for allergies.     . cholecalciferol (VITAMIN D) 1000 units tablet Take 1,000 Units by mouth daily.    Marland Kitchen ECHINACEA EXTRACT PO Take 3 tablets by mouth daily.    . Glucosamine HCl-MSM (MSM GLUCOSAMINE PO) Take 2 tablets by mouth daily.     Marland Kitchen lidocaine (LIDODERM) 5 % Place 1 patch onto the skin daily. Remove & Discard patch within 12 hours or as directed by MD 30 patch 0  . meclizine (ANTIVERT) 25 MG tablet Take 1 tablet (25 mg total) by mouth 3 (three) times daily as needed for dizziness. 30 tablet 0  . Multiple Vitamins-Minerals (CENTRUM SILVER PO) Take 1 tablet by mouth daily.    . Omega-3 Fatty Acids (FISH OIL) 1000 MG CAPS Take 1 capsule by mouth daily.     Marland Kitchen OVER THE COUNTER MEDICATION Take 1 tablet by mouth 3 (three) times daily. [employer] Bone Strength supplement    . traMADol (ULTRAM) 50 MG tablet Take 1 tablet (50 mg total) by mouth every 6 (six) hours as needed. 12 tablet 0  . triamcinolone (NASACORT) 55 MCG/ACT AERO nasal inhaler Place 2 sprays into the nose daily as needed (allergies).     . triamcinolone cream (KENALOG) 0.1 % Apply 1 application topically 2 (two) times daily as needed (eczema.).     Marland Kitchen ELDERBERRY PO Take 2 lozenges by mouth daily. (Patient not taking: Reported on 10/18/2019)     No current facility-administered medications on file prior to visit.   She is allergic to macrobid  [nitrofurantoin macrocrystal], doxycycline, pravastatin sodium, amoxicillin-pot clavulanate, augmentin [amoxicillin-pot clavulanate], codeine, methylphenidate hcl, methylprednisolone, and  sulfamethoxazole-trimethoprim..  Review of Systems Review of Systems  Constitutional: Negative for activity change, appetite change and fatigue.  HENT: Negative for hearing loss, congestion, tinnitus and ear discharge.  dentist q24m Eyes: Negative for visual disturbance (see optho q1y -- vision corrected to 20/20 with glasses).  Respiratory: Negative for cough, chest tightness and shortness of breath.   Cardiovascular: Negative for chest pain, palpitations and leg swelling.  Gastrointestinal: Negative for abdominal pain, diarrhea, constipation and abdominal distention.  Genitourinary: Negative for urgency, frequency, decreased urine volume and difficulty urinating.  Musculoskeletal: Negative for back pain, arthralgias and gait problem.  Skin: Negative for color change, pallor and rash.  Neurological: Negative for dizziness, light-headedness, numbness and headaches.  Hematological: Negative for adenopathy. Does not bruise/bleed easily.  Psychiatric/Behavioral: Negative for suicidal ideas, confusion, sleep disturbance, self-injury, dysphoric mood, decreased concentration and agitation.       Objective:    BP (!) 150/82 (BP Location: Left Arm, Patient Position: Sitting, Cuff Size: Small)   Pulse 72   Temp 97.7 F (36.5 C) (Temporal)   Resp  16   Ht 5\' 3"  (1.6 m)   Wt 141 lb (64 kg)   SpO2 100%   BMI 24.98 kg/m  General appearance: alert, cooperative, appears stated age and no distress Head: Normocephalic, without obvious abnormality, atraumatic Eyes: negative findings: lids and lashes normal, conjunctivae and sclerae normal and pupils equal, round, reactive to light and accomodation Ears: normal TM's and external ear canals both ears Neck: no adenopathy, no carotid bruit, no JVD, supple, symmetrical, trachea midline and thyroid not enlarged, symmetric, no tenderness/mass/nodules Back: symmetric, no curvature. ROM normal. No CVA tenderness. Lungs: clear to auscultation  bilaterally Breasts: gyn Heart: regular rate and rhythm, S1, S2 normal, no murmur, click, rub or gallop Abdomen: soft, non-tender; bowel sounds normal; no masses,  no organomegaly Pelvic: deferred---gyn Extremities: extremities normal, atraumatic, no cyanosis or edema Pulses: 2+ and symmetric Skin: Skin color, texture, turgor normal. No rashes or lesions Lymph nodes: Cervical, supraclavicular, and axillary nodes normal. Neurologic: Alert and oriented X 3, normal strength and tone. Normal symmetric reflexes. Normal coordination and gait    Assessment:    Healthy female exam.      Plan:     ghm utd Check labs  See After Visit Summary for Counseling Recommendations    1. Preventative health care See above - TSH - Lipid panel - CBC with Differential/Platelet - Comprehensive metabolic panel

## 2019-10-18 NOTE — Patient Instructions (Signed)
Preventive Care 74 Years and Older, Female Preventive care refers to lifestyle choices and visits with your health care provider that can promote health and wellness. This includes:  A yearly physical exam. This is also called an annual well check.  Regular dental and eye exams.  Immunizations.  Screening for certain conditions.  Healthy lifestyle choices, such as diet and exercise. What can I expect for my preventive care visit? Physical exam Your health care provider will check:  Height and weight. These may be used to calculate body mass index (BMI), which is a measurement that tells if you are at a healthy weight.  Heart rate and blood pressure.  Your skin for abnormal spots. Counseling Your health care provider may ask you questions about:  Alcohol, tobacco, and drug use.  Emotional well-being.  Home and relationship well-being.  Sexual activity.  Eating habits.  History of falls.  Memory and ability to understand (cognition).  Work and work Statistician.  Pregnancy and menstrual history. What immunizations do I need?  Influenza (flu) vaccine  This is recommended every year. Tetanus, diphtheria, and pertussis (Tdap) vaccine  You may need a Td booster every 10 years. Varicella (chickenpox) vaccine  You may need this vaccine if you have not already been vaccinated. Zoster (shingles) vaccine  You may need this after age 33. Pneumococcal conjugate (PCV13) vaccine  One dose is recommended after age 33. Pneumococcal polysaccharide (PPSV23) vaccine  One dose is recommended after age 72. Measles, mumps, and rubella (MMR) vaccine  You may need at least one dose of MMR if you were born in 1957 or later. You may also need a second dose. Meningococcal conjugate (MenACWY) vaccine  You may need this if you have certain conditions. Hepatitis A vaccine  You may need this if you have certain conditions or if you travel or work in places where you may be exposed  to hepatitis A. Hepatitis B vaccine  You may need this if you have certain conditions or if you travel or work in places where you may be exposed to hepatitis B. Haemophilus influenzae type b (Hib) vaccine  You may need this if you have certain conditions. You may receive vaccines as individual doses or as more than one vaccine together in one shot (combination vaccines). Talk with your health care provider about the risks and benefits of combination vaccines. What tests do I need? Blood tests  Lipid and cholesterol levels. These may be checked every 5 years, or more frequently depending on your overall health.  Hepatitis C test.  Hepatitis B test. Screening  Lung cancer screening. You may have this screening every year starting at age 39 if you have a 30-pack-year history of smoking and currently smoke or have quit within the past 15 years.  Colorectal cancer screening. All adults should have this screening starting at age 36 and continuing until age 15. Your health care provider may recommend screening at age 23 if you are at increased risk. You will have tests every 1-10 years, depending on your results and the type of screening test.  Diabetes screening. This is done by checking your blood sugar (glucose) after you have not eaten for a while (fasting). You may have this done every 1-3 years.  Mammogram. This may be done every 1-2 years. Talk with your health care provider about how often you should have regular mammograms.  BRCA-related cancer screening. This may be done if you have a family history of breast, ovarian, tubal, or peritoneal cancers.  Other tests  Sexually transmitted disease (STD) testing.  Bone density scan. This is done to screen for osteoporosis. You may have this done starting at age 44. Follow these instructions at home: Eating and drinking  Eat a diet that includes fresh fruits and vegetables, whole grains, lean protein, and low-fat dairy products. Limit  your intake of foods with high amounts of sugar, saturated fats, and salt.  Take vitamin and mineral supplements as recommended by your health care provider.  Do not drink alcohol if your health care provider tells you not to drink.  If you drink alcohol: ? Limit how much you have to 0-1 drink a day. ? Be aware of how much alcohol is in your drink. In the [place] one drink equals one 12 oz bottle of beer (355 mL), one 5 oz glass of wine (148 mL), or one 1 oz glass of hard liquor (44 mL). Lifestyle  Take daily care of your teeth and gums.  Stay active. Exercise for at least 30 minutes on 5 or more days each week.  Do not use any products that contain nicotine or tobacco, such as cigarettes, e-cigarettes, and chewing tobacco. If you need help quitting, ask your health care provider.  If you are sexually active, practice safe sex. Use a condom or other form of protection in order to prevent STIs (sexually transmitted infections).  Talk with your health care provider about taking a low-dose aspirin or statin. What's next?  Go to your health care provider once a year for a well check visit.  Ask your health care provider how often you should have your eyes and teeth checked.  Stay up to date on all vaccines. This information is not intended to replace advice given to you by your health care provider. Make sure you discuss any questions you have with your health care provider. Document Revised: 04/06/2018 Document Reviewed: 04/06/2018 Elsevier Patient Education  2020 [employer].

## 2019-10-20 ENCOUNTER — Other Ambulatory Visit: Payer: Self-pay | Admitting: Family Medicine

## 2019-10-20 DIAGNOSIS — E785 Hyperlipidemia, unspecified: Secondary | ICD-10-CM

## 2019-10-24 ENCOUNTER — Other Ambulatory Visit: Payer: Self-pay

## 2019-10-24 ENCOUNTER — Telehealth: Payer: Self-pay

## 2019-10-24 ENCOUNTER — Other Ambulatory Visit: Payer: Self-pay | Admitting: Family Medicine

## 2019-10-24 DIAGNOSIS — E785 Hyperlipidemia, unspecified: Secondary | ICD-10-CM

## 2019-10-24 NOTE — Telephone Encounter (Signed)
Pt called. VM left on home phone

## 2019-10-24 NOTE — Telephone Encounter (Signed)
Spoke with patient. Pt states not wanting to start medication. Pt states she wants to have the test done to determine if her cholesterol is "sticky" or "fluffy". Pt states if cholesterol is sticky she will start medication. Please advise

## 2019-10-24 NOTE — Telephone Encounter (Signed)
I put order in but I don't know for sure if medicare will pay for the test

## 2019-10-24 NOTE — Telephone Encounter (Signed)
-----   Message from Ann Held, [place] sent at 10/20/2019  1:35 PM EDT ----- The 10-year ASCVD risk score Mikey Bussing DC Brooke Bonito., et al., 2013) is: 19.4%   Values used to calculate the score:     Age: 74 years     Sex: Female     Is Non-Hispanic African American: No     Diabetic: No     Tobacco smoker: No     Systolic Blood Pressure: 876 mmHg     Is BP treated: No     HDL Cholesterol: 82.4 mg/dL     Total Cholesterol: 230 mg/dL Due to risk of 19 %  advise starting crestor 5 mg 3x aweek ---MWF #12  3 refills  Cholesterol--- LDL goal < 100,  HDL >40,  TG < 150.  Diet and exercise will increase HDL and decrease LDL and TG.  Fish,  Fish Oil, Flaxseed oil will also help increase the HDL and decrease Triglycerides.   Recheck labs in 3 months.

## 2019-10-30 DIAGNOSIS — M25561 Pain in right knee: Secondary | ICD-10-CM | POA: Diagnosis not present

## 2019-11-05 ENCOUNTER — Other Ambulatory Visit (INDEPENDENT_AMBULATORY_CARE_PROVIDER_SITE_OTHER): Payer: Medicare PPO

## 2019-11-05 ENCOUNTER — Other Ambulatory Visit: Payer: Self-pay

## 2019-11-05 DIAGNOSIS — E785 Hyperlipidemia, unspecified: Secondary | ICD-10-CM

## 2019-11-05 LAB — LIPID PANEL
Cholesterol: 228 mg/dL — ABNORMAL HIGH (ref 0–200)
HDL: 77.3 mg/dL (ref >39.00)
LDL Cholesterol: 137 mg/dL — ABNORMAL HIGH (ref 0–99)
NonHDL: 150.26
Total CHOL/HDL Ratio: 3
Triglycerides: 65 mg/dL (ref 0.0–149.0)
VLDL: 13 mg/dL (ref 0.0–40.0)

## 2019-11-05 LAB — COMPREHENSIVE METABOLIC PANEL
ALT: 16 U/L (ref 0–35)
AST: 16 U/L (ref 0–37)
Albumin: 4.2 g/dL (ref 3.5–5.2)
Alkaline Phosphatase: 71 U/L (ref 39–117)
BUN: 17 mg/dL (ref 6–23)
CO2: 30 mEq/L (ref 19–32)
Calcium: 9.7 mg/dL (ref 8.4–10.5)
Chloride: 104 mEq/L (ref 96–112)
Creatinine, Ser: 0.86 mg/dL (ref 0.40–1.20)
GFR: 64.42 mL/min (ref >60.00)
Glucose, Bld: 93 mg/dL (ref 70–99)
Potassium: 4.8 mEq/L (ref 3.5–5.1)
Sodium: 141 mEq/L (ref 135–145)
Total Bilirubin: 0.4 mg/dL (ref 0.2–1.2)
Total Protein: 6.6 g/dL (ref 6.0–8.3)

## 2019-11-07 LAB — NMR LIPOPROF NOLP+GRAPH
HDL Particle Number: 31.7 umol/L (ref >=30.5)
LDL Particle Number: 1269 nmol/L — ABNORMAL HIGH (ref <1000)
LDL Size: 21.7 nm (ref >20.5)
LP-IR Score: <25 (ref <=45)
Small LDL Particle Number: <90 nmol/L (ref <=527)

## 2019-11-13 ENCOUNTER — Encounter: Payer: Self-pay | Admitting: Family Medicine

## 2019-11-13 NOTE — Telephone Encounter (Signed)
I don't know of anything that has worked esp bringing it down that much. I have a few pt going to [facility]

## 2019-11-13 NOTE — Telephone Encounter (Signed)
Please advise 

## 2019-11-20 DIAGNOSIS — M13841 Other specified arthritis, right hand: Secondary | ICD-10-CM | POA: Diagnosis not present

## 2019-12-25 ENCOUNTER — Telehealth: Payer: Self-pay | Admitting: Family Medicine

## 2019-12-25 NOTE — Telephone Encounter (Signed)
Patient is calling and wanted to see if she can be scheduled for an Annual Well Visit, please advise. CB is [phone]

## 2019-12-26 NOTE — Telephone Encounter (Signed)
LVM for pt to call office to schedule appt.

## 2019-12-27 NOTE — Progress Notes (Signed)
I connected with Kim Mclaughlin today by telephone and verified that I am speaking with the correct person using two identifiers. Location patient: home Location provider: work Persons participating in the virtual visit: patient, Marine scientist.    I discussed the limitations, risks, security and privacy concerns of performing an evaluation and management service by telephone and the availability of in person appointments. I also discussed with the patient that there may be a patient responsible charge related to this service. The patient expressed understanding and verbally consented to this telephonic visit.    Interactive audio and video telecommunications were attempted between this provider and patient, however failed, due to patient having technical difficulties OR patient did not have access to video capability.  We continued and completed visit with audio only.  Some vital signs may be absent or patient reported.    Subjective:   Kim Mclaughlin is a 74 y.o. female who presents for Medicare Annual (Subsequent) preventive examination.  Review of Systems    Cardiac Risk Factors include: advanced age (>38men, >45 women);dyslipidemia     Objective:    Today's Vitals   12/28/19 0931  BP: 132/79  Pulse: 70  Weight: 142 lb (64.4 kg)  Height: 5\' 3"  (1.6 m)   Body mass index is 25.15 kg/m.  Advanced Directives 12/28/2019 11/16/2018 11/07/2017 07/29/2016 04/22/2016 04/16/2016 03/25/2016  Does Patient Have a Medical Advance Directive? Yes No Yes Yes Yes Yes -  Type of [employer] [place];Living will [place];Living will [place];Living will [place];Living will [place];Living will  Does patient want to make changes to medical advance directive? No - Patient declined - - - No - Patient declined - -  Copy of [place] in Chart? Yes - validated most  recent copy scanned in chart (See row information) - Yes No - copy requested Yes - Yes  Would patient like information on creating a medical advance directive? - Yes (ED - Information included in AVS) - - - - -    Current Medications (verified) Outpatient Encounter Medications as of 12/28/2019  Medication Sig  . aspirin EC 81 MG tablet Take 81 mg by mouth daily.  . cetirizine (ZYRTEC) 10 MG tablet Take 10 mg by mouth daily as needed for allergies.   . cholecalciferol (VITAMIN D) 1000 units tablet Take 1,000 Units by mouth daily.  Marland Kitchen ELDERBERRY PO Take 2 lozenges by mouth daily.   . Glucosamine HCl-MSM (MSM GLUCOSAMINE PO) Take 2 tablets by mouth daily.   Marland Kitchen lidocaine (LIDODERM) 5 % Place 1 patch onto the skin daily. Remove & Discard patch within 12 hours or as directed by MD  . meclizine (ANTIVERT) 25 MG tablet Take 1 tablet (25 mg total) by mouth 3 (three) times daily as needed for dizziness.  . Multiple Vitamins-Minerals (CENTRUM SILVER PO) Take 1 tablet by mouth daily.  . Omega-3 Fatty Acids (FISH OIL) 1000 MG CAPS Take 1 capsule by mouth daily.   Marland Kitchen OVER THE COUNTER MEDICATION Take 1 tablet by mouth 3 (three) times daily. [employer] Bone Strength supplement  . triamcinolone (NASACORT) 55 MCG/ACT AERO nasal inhaler Place 2 sprays into the nose daily as needed (allergies).   Marland Kitchen ECHINACEA EXTRACT PO Take 3 tablets by mouth daily. (Patient not taking: Reported on 12/28/2019)  . traMADol (ULTRAM) 50 MG tablet Take 1 tablet (50 mg total) by mouth every 6 (six) hours as needed. (Patient  not taking: Reported on 12/28/2019)  . triamcinolone cream (KENALOG) 0.1 % Apply 1 application topically 2 (two) times daily as needed (eczema.).    No facility-administered encounter medications on file as of 12/28/2019.    Allergies (verified) Macrobid  [nitrofurantoin macrocrystal], Doxycycline, Pravastatin sodium, Amoxicillin-pot clavulanate, Augmentin [amoxicillin-pot clavulanate], Codeine, Methylphenidate hcl,  Methylprednisolone, and Sulfamethoxazole-trimethoprim   History: Past Medical History:  Diagnosis Date  . Allergy   . Arthritis 07/2014   Right hand, prescribed prednisone-no longer taking. also Rt knww  . DVT (deep venous thrombosis) ([place]) 2015   very small; treated with compression and heat, no other problems since  . GERD (gastroesophageal reflux disease)    patient denies states she does have hx of peptic ulcer many years  . Hepatitis    in 10 th grade  (not sure type- not hep C) - treated  no other problems  . Hyperlipidemia    diet controlled  . MVP (mitral valve prolapse)    no problems, MD told patient she could not hear it  . Osteopenia    [facility]  . PONV (postoperative nausea and vomiting)    with ear surgery only - No other problems with other surgeries  . Precancerous lesion 12/2014   Treated with cream only  . Varicose veins    Past Surgical History:  Procedure Laterality Date  . APPENDECTOMY    . BUNIONECTOMY WITH HAMMERTOE RECONSTRUCTION Left 04/22/2016   Procedure: MODIFIED MCBRIDE BUNIONECTOMY,  LEFT SECOND HAMMERTOE CORRECTION;  Surgeon: Wylene Simmer, MD;  Location: [facility];  Service: Orthopedics;  Laterality: Left;  . COLONOSCOPY     07/2012 Olevia Perches - [facility]/father - polyps  . colonoscopy with polypectomy  07/2012    hyperplastic;Dr Olevia Perches  . DILATION AND CURETTAGE OF UTERUS    . DILATION AND CURETTAGE OF UTERUS N/A 09/03/2014   Procedure: DILATATION AND CURETTAGE;  Surgeon: Harle Battiest, MD;  Location: [facility];  Service: Gynecology;  Laterality: N/A;  . ENDOVENOUS ABLATION SAPHENOUS VEIN W/ LASER Left 01-31-2014   EVLA  left greater saphenous vein  by Curt Jews MD  . ENDOVENOUS ABLATION SAPHENOUS VEIN W/ LASER Right 03-14-2014   endovenous laser ablation right greater saphenous vein  by Curt Jews MD  . FOOT ARTHRODESIS Left 04/22/2016   Procedure: LEFT FIRST TARSAL METATARSAL ARTHRODESIS;  Surgeon: Wylene Simmer, MD;  Location:  [facility];  Service: Orthopedics;  Laterality: Left;  Marland Kitchen G 3 P 3    . HYSTEROSCOPY WITH D & C N/A 03/25/2016   Procedure: DILATATION AND CURETTAGE /HYSTEROSCOPY;  Surgeon: Jerelyn Charles, MD;  Location: [facility];  Service: Gynecology;  Laterality: N/A;  WITH ULTRASOUND GUIDANCE  . left ear surgery    . STAPEDECTOMY    . TARSAL METATARSAL FUSION WITH WEIL OSTEOTOMY Left 04/22/2016   Procedure: 2-4 TARSAL METATARSAL FUSION WITH WEIL OSTEOTOMY;  Surgeon: Wylene Simmer, MD;  Location: [facility];  Service: Orthopedics;  Laterality: Left;  . TUBAL LIGATION     Family History  Problem Relation Age of Onset  . Pancreatic cancer Mother   . Miscarriages / [place] Mother   . Thyroid disease Mother        partial thyroidectomy ; S/P RAI  . Heart attack Father        in early 37s  . Colon cancer Father   . Colon cancer Paternal Grandmother   . Stroke Paternal Grandmother        > 65  .  Heart attack Paternal Grandfather        in 60s  . Cancer Other        among paternal sibs (colon, breast, bone)  . Aneurysm Paternal Uncle        brain  . Ovarian cancer Maternal Aunt   . Breast cancer Paternal Aunt   . Diabetes Neg Hx   . Stomach cancer Neg Hx   . Ulcerative colitis Neg Hx    Social History   Socioeconomic History  . Marital status: Married    Spouse name: Not on file  . Number of children: Not on file  . Years of education: Not on file  . Highest education level: Not on file  Occupational History  . Occupation: retired  Tobacco Use  . Smoking status: Former Smoker    Types: Cigarettes    Quit date: 04/27/1963    Years since quitting: 56.7  . Smokeless tobacco: Never Used  . Tobacco comment: Smoked < 6 mos;< 1 pack total;quit 1965  Vaping Use  . Vaping Use: Never used  Substance and Sexual Activity  . Alcohol use: No  . Drug use: No  . Sexual activity: Yes    Birth control/protection: Post-menopausal  Other Topics Concern  . Not on file    Social History Narrative   Exercise-  Walks on Mondays and Wednesdays on the treadmill   Social Determinants of Health   Financial Resource Strain: Low Risk   . Difficulty of Paying Living Expenses: Not hard at all  Food Insecurity: No Food Insecurity  . Worried About Charity fundraiser in the Last Year: Never true  . Ran Out of Food in the Last Year: Never true  Transportation Needs: No Transportation Needs  . Lack of Transportation (Medical): No  . Lack of Transportation (Non-Medical): No  Physical Activity:   . Days of Exercise per Week: Not on file  . Minutes of Exercise per Session: Not on file  Stress:   . Feeling of Stress : Not on file  Social Connections:   . Frequency of Communication with Friends and Family: Not on file  . Frequency of Social Gatherings with Friends and Family: Not on file  . Attends Religious Services: Not on file  . Active Member of Clubs or Organizations: Not on file  . Attends Archivist Meetings: Not on file  . Marital Status: Not on file    Tobacco Counseling Counseling given: Not Answered Comment: Smoked < 6 mos;< 1 pack total;quit 1965   Clinical Intake:   Pain : No/denies pain    Activities of Daily Living In your present state of health, do you have any difficulty performing the following activities: 12/28/2019  Hearing? N  Vision? N  Difficulty concentrating or making decisions? N  Walking or climbing stairs? N  Dressing or bathing? N  Doing errands, shopping? N  Preparing Food and eating ? N  Using the Toilet? N  In the past six months, have you accidently leaked urine? N  Do you have problems with loss of bowel control? N  Managing your Medications? N  Managing your Finances? N  Housekeeping or managing your Housekeeping? N  Some recent data might be hidden    Patient Care Team: Carollee Herter, Alferd Apa, DO as PCP - General (Family Medicine) Jerelyn Charles, MD as Consulting Physician (Obstetrics) Dermatology,  Donata Duff, Jori Moll, MD as Consulting Physician (Orthopedic Surgery)  Indicate any recent Medical Services you may have received from other  than [facility] providers in the past year (date may be approximate).     Assessment:   This is a routine wellness examination for [place].  Dietary issues and exercise activities discussed: Current Exercise Habits: The patient does not participate in regular exercise at present, Exercise limited by: None identified Diet (meal preparation, eat out, water intake, caffeinated beverages, dairy products, fruits and vegetables): well balanced   Goals    . Exercise 150 minutes per week (moderate activity)     Goal is to start walking again at the y       Depression Screen PHQ 2/9 Scores 12/28/2019 05/12/2018 11/07/2017 08/05/2017 07/29/2016 06/30/2015 04/24/2015  PHQ - 2 Score 0 0 0 4 0 0 0  PHQ- 9 Score - - - 8 - - -    Fall Risk Fall Risk  12/28/2019 10/18/2019 05/12/2018 11/07/2017 07/29/2016  Falls in the past year? 0 1 0 No No  Number falls in past yr: 0 0 - - -  Comment - - - - -  Injury with Fall? 0 1 - - -  Comment - TAILBONE FRACTURE - - -  Follow up Education provided;Falls prevention discussed - Falls evaluation completed - -    Any stairs in or around the home? Yes  If so, are there any without handrails? Yes  Home free of loose throw rugs in walkways, pet beds, electrical cords, etc? Yes  Adequate lighting in your home to reduce risk of falls? Yes   ASSISTIVE DEVICES UTILIZED TO PREVENT FALLS: none needed per pt.     Cognitive Function: Ad8 score reviewed for issues:  Issues making decisions:no  Less interest in hobbies / activities:no  Repeats questions, stories (family complaining):no  Trouble using ordinary gadgets (microwave, computer, phone):no  Forgets the month or year: no  Mismanaging finances: no  Remembering appts:no  Daily problems with thinking and/or memory:no Ad8 score is=0     MMSE - Mini Mental State Exam  07/29/2016 04/24/2015  Not completed: - (No Data)  Orientation to time 5 -  Orientation to Place 5 -  Registration 3 -  Attention/ Calculation 5 -  Recall 3 -  Language- name 2 objects 2 -  Language- repeat 1 -  Language- follow 3 step command 3 -  Language- read & follow direction 1 -  Write a sentence 1 -  Copy design 1 -  Total score 30 -        Immunizations Immunization History  Administered Date(s) Administered  . Fluad Quad(high Dose 65+) 01/17/2019  . Influenza Whole 05/16/2012  . Influenza, High Dose Seasonal PF 02/21/2014, 01/19/2016, 01/28/2018  . Influenza-Unspecified 01/29/2015, 02/01/2017  . Pneumococcal Conjugate-13 01/08/2015  . Pneumococcal Polysaccharide-23 06/11/2011  . Td 01/25/2008  . Tdap 06/27/2018  . Zoster 12/13/2012  . Zoster Recombinat (Shingrix) 08/02/2016, 10/01/2016    TDAP status: Up to date Flu Vaccine status: Up to date Pneumococcal vaccine status: Up to date Covid-19 vaccine status: Information provided on how to obtain vaccines.   Qualifies for Shingles Vaccine? Yes   Zostavax completed Yes   Shingrix Completed?: Yes  Screening Tests Health Maintenance  Topic Date Due  . COVID-19 Vaccine (1) Never done  . INFLUENZA VACCINE  11/25/2019  . MAMMOGRAM  03/04/2021  . DEXA SCAN  06/28/2021  . COLONOSCOPY  06/23/2023  . TETANUS/TDAP  06/26/2028  . Hepatitis C Screening  Completed  . PNA vac Low Risk Adult  Completed    Health Maintenance  Health Maintenance Due  Topic Date Due  . COVID-19 Vaccine (1) Never done  . INFLUENZA VACCINE  11/25/2019    Colorectal cancer screening: Completed 06/22/18. Repeat every 5 years Pt reports she has mammogram scheduled for 01/2020. Bone Density status: Completed 06/29/19. Results reflect: Bone density results: NORMAL. Repeat every 2 years.  Lung Cancer Screening: (Low Dose CT Chest recommended if Age 18-80 years, 30 pack-year currently smoking OR have quit w/in 15years.) does not qualify.    Lung Cancer Screening Referral: na  Additional Screening:  Hepatitis C Screening: does qualify; Completed 06/30/15  Vision Screening: Recommended annual ophthalmology exams for early detection of glaucoma and other disorders of the eye. Is the patient up to date with their annual eye exam?  Yes  Who is the provider or what is the name of the office in which the patient attends annual eye exams? Dr.Woods at [facility]   Dental Screening: Recommended annual dental exams for proper oral hygiene  Community Resource Referral / Chronic Care Management: CRR required this visit?  No   CCM required this visit?  No      Plan:    Continue to eat heart healthy diet (full of fruits, vegetables, whole grains, lean protein, water--limit salt, fat, and sugar intake) and increase physical activity as tolerated.  Continue doing brain stimulating activities (puzzles, reading, adult coloring books, staying active) to keep memory sharp.     I have personally reviewed and noted the following in the patient's chart:   . Medical and social history . Use of alcohol, tobacco or illicit drugs  . Current medications and supplements . Functional ability and status . Nutritional status . Physical activity . Advanced directives . List of other physicians . Hospitalizations, surgeries, and ER visits in previous 12 months . Vitals . Screenings to include cognitive, depression, and falls . Referrals and appointments  In addition, I have reviewed and discussed with patient certain preventive protocols, quality metrics, and best practice recommendations. A written personalized care plan for preventive services as well as general preventive health recommendations were provided to patient.   Due to this being a telephonic visit, the after visit summary with patients personalized plan was offered to patient via mail or my-chart.Patient would like to access on my-chart.   Shela Nevin,  [place]   12/28/2019

## 2019-12-28 ENCOUNTER — Encounter: Payer: Self-pay | Admitting: *Deleted

## 2019-12-28 ENCOUNTER — Other Ambulatory Visit: Payer: Self-pay

## 2019-12-28 ENCOUNTER — Ambulatory Visit (INDEPENDENT_AMBULATORY_CARE_PROVIDER_SITE_OTHER): Payer: Medicare PPO | Admitting: *Deleted

## 2019-12-28 VITALS — BP 132/79 | HR 70 | Ht 63.0 in | Wt 142.0 lb

## 2019-12-28 DIAGNOSIS — Z0000 Encounter for general adult medical examination without abnormal findings: Secondary | ICD-10-CM | POA: Diagnosis not present

## 2019-12-28 NOTE — Patient Instructions (Signed)
Continue to eat heart healthy diet (full of fruits, vegetables, whole grains, lean protein, water--limit salt, fat, and sugar intake) and increase physical activity as tolerated.  Continue doing brain stimulating activities (puzzles, reading, adult coloring books, staying active) to keep memory sharp.    Kim Mclaughlin , Thank you for taking time to come for your Medicare Wellness Visit. I appreciate your ongoing commitment to your health goals. Please review the following plan we discussed and let me know if I can assist you in the future.   These are the goals we discussed: Goals    . Exercise 150 minutes per week (moderate activity)     Goal is to start walking again at the y        This is a list of the screening recommended for you and due dates:  Health Maintenance  Topic Date Due  . COVID-19 Vaccine (1) Never done  . Flu Shot  11/25/2019  . Mammogram  03/04/2021  . DEXA scan (bone density measurement)  06/28/2021  . Colon Cancer Screening  06/23/2023  . Tetanus Vaccine  06/26/2028  .  Hepatitis C: One time screening is recommended by Center for Disease Control  (CDC) for  adults born from [birth year] through 1965.   Completed  . Pneumonia vaccines  Completed    Preventive Care 51 Years and Older, Female Preventive care refers to lifestyle choices and visits with your health care provider that can promote health and wellness. This includes:  A yearly physical exam. This is also called an annual well check.  Regular dental and eye exams.  Immunizations.  Screening for certain conditions.  Healthy lifestyle choices, such as diet and exercise. What can I expect for my preventive care visit? Physical exam Your health care provider will check:  Height and weight. These may be used to calculate body mass index (BMI), which is a measurement that tells if you are at a healthy weight.  Heart rate and blood pressure.  Your skin for abnormal spots. Counseling Your health care  provider may ask you questions about:  Alcohol, tobacco, and drug use.  Emotional well-being.  Home and relationship well-being.  Sexual activity.  Eating habits.  History of falls.  Memory and ability to understand (cognition).  Work and work Statistician.  Pregnancy and menstrual history. What immunizations do I need?  Influenza (flu) vaccine  This is recommended every year. Tetanus, diphtheria, and pertussis (Tdap) vaccine  You may need a Td booster every 10 years. Varicella (chickenpox) vaccine  You may need this vaccine if you have not already been vaccinated. Zoster (shingles) vaccine  You may need this after age 5. Pneumococcal conjugate (PCV13) vaccine  One dose is recommended after age 10. Pneumococcal polysaccharide (PPSV23) vaccine  One dose is recommended after age 56. Measles, mumps, and rubella (MMR) vaccine  You may need at least one dose of MMR if you were born in 1957 or later. You may also need a second dose. Meningococcal conjugate (MenACWY) vaccine  You may need this if you have certain conditions. Hepatitis A vaccine  You may need this if you have certain conditions or if you travel or work in places where you may be exposed to hepatitis A. Hepatitis B vaccine  You may need this if you have certain conditions or if you travel or work in places where you may be exposed to hepatitis B. Haemophilus influenzae type b (Hib) vaccine  You may need this if you have certain conditions. You may  receive vaccines as individual doses or as more than one vaccine together in one shot (combination vaccines). Talk with your health care provider about the risks and benefits of combination vaccines. What tests do I need? Blood tests  Lipid and cholesterol levels. These may be checked every 5 years, or more frequently depending on your overall health.  Hepatitis C test.  Hepatitis B test. Screening  Lung cancer screening. You may have this screening  every year starting at age 105 if you have a 30-pack-year history of smoking and currently smoke or have quit within the past 15 years.  Colorectal cancer screening. All adults should have this screening starting at age 8 and continuing until age 24. Your health care provider may recommend screening at age 51 if you are at increased risk. You will have tests every 1-10 years, depending on your results and the type of screening test.  Diabetes screening. This is done by checking your blood sugar (glucose) after you have not eaten for a while (fasting). You may have this done every 1-3 years.  Mammogram. This may be done every 1-2 years. Talk with your health care provider about how often you should have regular mammograms.  BRCA-related cancer screening. This may be done if you have a family history of breast, ovarian, tubal, or peritoneal cancers. Other tests  Sexually transmitted disease (STD) testing.  Bone density scan. This is done to screen for osteoporosis. You may have this done starting at age 88. Follow these instructions at home: Eating and drinking  Eat a diet that includes fresh fruits and vegetables, whole grains, lean protein, and low-fat dairy products. Limit your intake of foods with high amounts of sugar, saturated fats, and salt.  Take vitamin and mineral supplements as recommended by your health care provider.  Do not drink alcohol if your health care provider tells you not to drink.  If you drink alcohol: ? Limit how much you have to 0-1 drink a day. ? Be aware of how much alcohol is in your drink. In the [place] one drink equals one 12 oz bottle of beer (355 mL), one 5 oz glass of wine (148 mL), or one 1 oz glass of hard liquor (44 mL). Lifestyle  Take daily care of your teeth and gums.  Stay active. Exercise for at least 30 minutes on 5 or more days each week.  Do not use any products that contain nicotine or tobacco, such as cigarettes, e-cigarettes, and chewing  tobacco. If you need help quitting, ask your health care provider.  If you are sexually active, practice safe sex. Use a condom or other form of protection in order to prevent STIs (sexually transmitted infections).  Talk with your health care provider about taking a low-dose aspirin or statin. What's next?  Go to your health care provider once a year for a well check visit.  Ask your health care provider how often you should have your eyes and teeth checked.  Stay up to date on all vaccines. This information is not intended to replace advice given to you by your health care provider. Make sure you discuss any questions you have with your health care provider. Document Revised: 04/06/2018 Document Reviewed: 04/06/2018 Elsevier Patient Education  2020 [employer].

## 2020-01-14 DIAGNOSIS — Z20828 Contact with and (suspected) exposure to other viral communicable diseases: Secondary | ICD-10-CM | POA: Diagnosis not present

## 2020-01-30 DIAGNOSIS — B078 Other viral warts: Secondary | ICD-10-CM | POA: Diagnosis not present

## 2020-01-30 DIAGNOSIS — L814 Other melanin hyperpigmentation: Secondary | ICD-10-CM | POA: Diagnosis not present

## 2020-01-30 DIAGNOSIS — L821 Other seborrheic keratosis: Secondary | ICD-10-CM | POA: Diagnosis not present

## 2020-02-27 DIAGNOSIS — Z1231 Encounter for screening mammogram for malignant neoplasm of breast: Secondary | ICD-10-CM | POA: Diagnosis not present

## 2020-02-27 DIAGNOSIS — Z01419 Encounter for gynecological examination (general) (routine) without abnormal findings: Secondary | ICD-10-CM | POA: Diagnosis not present

## 2020-03-25 DIAGNOSIS — M1711 Unilateral primary osteoarthritis, right knee: Secondary | ICD-10-CM | POA: Diagnosis not present

## 2020-04-01 DIAGNOSIS — M25561 Pain in right knee: Secondary | ICD-10-CM | POA: Diagnosis not present

## 2020-04-08 DIAGNOSIS — M1711 Unilateral primary osteoarthritis, right knee: Secondary | ICD-10-CM | POA: Diagnosis not present

## 2020-04-08 DIAGNOSIS — M25561 Pain in right knee: Secondary | ICD-10-CM | POA: Diagnosis not present

## 2020-05-01 ENCOUNTER — Emergency Department (HOSPITAL_COMMUNITY): Payer: Medicare PPO

## 2020-05-01 ENCOUNTER — Observation Stay (HOSPITAL_COMMUNITY)
Admission: EM | Admit: 2020-05-01 | Discharge: 2020-05-02 | Disposition: A | Discharge status: Home / Self Care | Payer: Medicare PPO | Attending: Internal Medicine | Admitting: Internal Medicine

## 2020-05-01 ENCOUNTER — Encounter (HOSPITAL_COMMUNITY): Payer: Self-pay | Admitting: Emergency Medicine

## 2020-05-01 ENCOUNTER — Other Ambulatory Visit: Payer: Self-pay

## 2020-05-01 ENCOUNTER — Observation Stay (HOSPITAL_COMMUNITY): Payer: Medicare PPO

## 2020-05-01 DIAGNOSIS — Z87891 Personal history of nicotine dependence: Secondary | ICD-10-CM | POA: Diagnosis not present

## 2020-05-01 DIAGNOSIS — R030 Elevated blood-pressure reading, without diagnosis of hypertension: Secondary | ICD-10-CM | POA: Insufficient documentation

## 2020-05-01 DIAGNOSIS — Z888 Allergy status to other drugs, medicaments and biological substances status: Secondary | ICD-10-CM | POA: Insufficient documentation

## 2020-05-01 DIAGNOSIS — I341 Nonrheumatic mitral (valve) prolapse: Secondary | ICD-10-CM | POA: Insufficient documentation

## 2020-05-01 DIAGNOSIS — Z7982 Long term (current) use of aspirin: Secondary | ICD-10-CM | POA: Diagnosis not present

## 2020-05-01 DIAGNOSIS — R9431 Abnormal electrocardiogram [ECG] [EKG]: Secondary | ICD-10-CM | POA: Diagnosis not present

## 2020-05-01 DIAGNOSIS — Z880 Allergy status to penicillin: Secondary | ICD-10-CM | POA: Diagnosis not present

## 2020-05-01 DIAGNOSIS — R413 Other amnesia: Secondary | ICD-10-CM | POA: Diagnosis present

## 2020-05-01 DIAGNOSIS — Z79899 Other long term (current) drug therapy: Secondary | ICD-10-CM | POA: Insufficient documentation

## 2020-05-01 DIAGNOSIS — R4182 Altered mental status, unspecified: Secondary | ICD-10-CM | POA: Diagnosis not present

## 2020-05-01 DIAGNOSIS — I619 Nontraumatic intracerebral hemorrhage, unspecified: Secondary | ICD-10-CM | POA: Diagnosis not present

## 2020-05-01 DIAGNOSIS — R42 Dizziness and giddiness: Secondary | ICD-10-CM | POA: Diagnosis not present

## 2020-05-01 DIAGNOSIS — Z20822 Contact with and (suspected) exposure to covid-19: Secondary | ICD-10-CM | POA: Diagnosis not present

## 2020-05-01 DIAGNOSIS — G454 Transient global amnesia: Secondary | ICD-10-CM | POA: Diagnosis not present

## 2020-05-01 DIAGNOSIS — Z885 Allergy status to narcotic agent status: Secondary | ICD-10-CM | POA: Diagnosis not present

## 2020-05-01 DIAGNOSIS — I351 Nonrheumatic aortic (valve) insufficiency: Secondary | ICD-10-CM | POA: Insufficient documentation

## 2020-05-01 DIAGNOSIS — E782 Mixed hyperlipidemia: Secondary | ICD-10-CM | POA: Diagnosis not present

## 2020-05-01 DIAGNOSIS — Z8619 Personal history of other infectious and parasitic diseases: Secondary | ICD-10-CM | POA: Insufficient documentation

## 2020-05-01 DIAGNOSIS — K219 Gastro-esophageal reflux disease without esophagitis: Secondary | ICD-10-CM | POA: Insufficient documentation

## 2020-05-01 DIAGNOSIS — E785 Hyperlipidemia, unspecified: Secondary | ICD-10-CM | POA: Diagnosis present

## 2020-05-01 DIAGNOSIS — Z86718 Personal history of other venous thrombosis and embolism: Secondary | ICD-10-CM | POA: Diagnosis not present

## 2020-05-01 DIAGNOSIS — G319 Degenerative disease of nervous system, unspecified: Secondary | ICD-10-CM | POA: Diagnosis not present

## 2020-05-01 DIAGNOSIS — J3489 Other specified disorders of nose and nasal sinuses: Secondary | ICD-10-CM | POA: Diagnosis not present

## 2020-05-01 DIAGNOSIS — R410 Disorientation, unspecified: Secondary | ICD-10-CM | POA: Diagnosis not present

## 2020-05-01 DIAGNOSIS — I729 Aneurysm of unspecified site: Secondary | ICD-10-CM

## 2020-05-01 DIAGNOSIS — R29818 Other symptoms and signs involving the nervous system: Secondary | ICD-10-CM | POA: Diagnosis not present

## 2020-05-01 LAB — BASIC METABOLIC PANEL
Anion gap: 13 (ref 5–15)
BUN: 17 mg/dL (ref 8–23)
CO2: 23 mmol/L (ref 22–32)
Calcium: 10.5 mg/dL — ABNORMAL HIGH (ref 8.9–10.3)
Chloride: 104 mmol/L (ref 98–111)
Creatinine, Ser: 0.93 mg/dL (ref 0.44–1.00)
GFR, Estimated: >60 mL/min (ref >60)
Glucose, Bld: 131 mg/dL — ABNORMAL HIGH (ref 70–99)
Potassium: 4.3 mmol/L (ref 3.5–5.1)
Sodium: 140 mmol/L (ref 135–145)

## 2020-05-01 LAB — CBC WITH DIFFERENTIAL/PLATELET
Abs Immature Granulocytes: 0.03 10*3/uL (ref 0.00–0.07)
Basophils Absolute: 0.1 10*3/uL (ref 0.0–0.1)
Basophils Relative: 1 %
Eosinophils Absolute: 0 10*3/uL (ref 0.0–0.5)
Eosinophils Relative: 0 %
HCT: 44.5 % (ref 36.0–46.0)
Hemoglobin: 14.8 g/dL (ref 12.0–15.0)
Immature Granulocytes: 0 %
Lymphocytes Relative: 10 %
Lymphs Abs: 0.8 10*3/uL (ref 0.7–4.0)
MCH: 31.7 pg (ref 26.0–34.0)
MCHC: 33.3 g/dL (ref 30.0–36.0)
MCV: 95.3 fL (ref 80.0–100.0)
Monocytes Absolute: 0.5 10*3/uL (ref 0.1–1.0)
Monocytes Relative: 6 %
Neutro Abs: 6.2 10*3/uL (ref 1.7–7.7)
Neutrophils Relative %: 83 %
Platelets: 361 10*3/uL (ref 150–400)
RBC: 4.67 MIL/uL (ref 3.87–5.11)
RDW: 12.3 % (ref 11.5–15.5)
WBC: 7.5 10*3/uL (ref 4.0–10.5)
nRBC: 0 % (ref 0.0–0.2)

## 2020-05-01 LAB — URINALYSIS, ROUTINE W REFLEX MICROSCOPIC
Bacteria, UA: NONE SEEN
Bilirubin Urine: NEGATIVE
Glucose, UA: NEGATIVE mg/dL
Hgb urine dipstick: NEGATIVE
Ketones, ur: 5 mg/dL — AB
Nitrite: NEGATIVE
Protein, ur: NEGATIVE mg/dL
Specific Gravity, Urine: 1.008 (ref 1.005–1.030)
pH: 8 (ref 5.0–8.0)

## 2020-05-01 LAB — CBG MONITORING, ED: Glucose-Capillary: 113 mg/dL — ABNORMAL HIGH (ref 70–99)

## 2020-05-01 LAB — APTT: aPTT: 28 seconds (ref 24–36)

## 2020-05-01 LAB — VITAMIN B12: Vitamin B-12: 657 pg/mL (ref 180–914)

## 2020-05-01 LAB — ETHANOL: Alcohol, Ethyl (B): <10 mg/dL (ref <10)

## 2020-05-01 LAB — AMMONIA: Ammonia: 25 umol/L (ref 9–35)

## 2020-05-01 LAB — PROTIME-INR
INR: 1 (ref 0.8–1.2)
Prothrombin Time: 12.3 seconds (ref 11.4–15.2)

## 2020-05-01 LAB — FOLATE: Folate: 79.4 ng/mL (ref >5.9)

## 2020-05-01 MED ORDER — ACETAMINOPHEN 650 MG RE SUPP: 650.0000 mg | RECTAL | Status: DC | PRN

## 2020-05-01 MED ORDER — ENOXAPARIN SODIUM 40 MG/0.4ML ~~LOC~~ SOLN
40.0000 mg | SUBCUTANEOUS | Status: DC
  Administered 2020-05-01: 40 mg via SUBCUTANEOUS
  Filled 2020-05-01: qty 0.4

## 2020-05-01 MED ORDER — ACETAMINOPHEN 325 MG PO TABS: 650.0000 mg | ORAL_TABLET | ORAL | Status: DC | PRN

## 2020-05-01 MED ORDER — ACETAMINOPHEN 160 MG/5ML PO SOLN
650.0000 mg | ORAL | Status: DC | PRN
Start: 2020-05-01 — End: 2020-05-02

## 2020-05-01 MED ORDER — ASPIRIN EC 81 MG PO TBEC
81.0000 mg | DELAYED_RELEASE_TABLET | Freq: Every day | ORAL | Status: DC
  Administered 2020-05-02: 81 mg via ORAL
  Filled 2020-05-01: qty 1

## 2020-05-01 MED ORDER — STROKE: EARLY STAGES OF RECOVERY BOOK
Freq: Once | Status: AC
  Filled 2020-05-01: qty 1

## 2020-05-01 NOTE — Consult Note (Signed)
[facility] Tele[facility]urology Consult Services  Stat Consult  Date of Service:   05/01/2020 18:11:54  Diagnosis:     .  R41.82 - AMS (Altered Mental Status)  Impression: 75 year old female history of hyperlipidemia presents with an episode of amnesia that appears to be resolving although not clearly back to baseline. No focality on exam consistent with acute ischemic stroke. Symptoms sound to be most consistent with either transient global amnesia versus a toxic metabolic etiology. Recommend checking urinalysis, tox metabolic work-up per the emergency department, admission for observation, MRI imaging as well as EEG to rule out secondary causes, neuro follow-up.  CT HEAD: Showed No Acute Hemorrhage or Acute Core Infarct  Our recommendations are outlined below.  Laboratory Studies: Check B12/folate TSH Ammonia CMP blood cultures x 2  Nursing Recommendations: Delirium precautions: ?Blinds open during the day, closed at night, frequent reorientation, minimize nighttime interruptions When possible avoid benzodiazepines, opioid pain medications, and anticholinergic medications  Consultations: Toxic metabolic work up per primary team  Disposition: Neurology will follow  Additional Recommendations:    Metrics: [facility] Notification Time: 05/01/2020 18:09:33 Stamp Time: 05/01/2020 18:11:54 Callback Response Time: 05/01/2020 18:14:29   ----------------------------------------------------------------------------------------------------  Chief Complaint: AMS  History of Present Illness: Patient is a 75 year old Female.  75 year old female history of hyperlipidemia who presents with an episode of confusion and altered mentation. She presents to the ER with her husband who helps provide history. Apparently she went to the supermarket today at around 10 AM and was doing well per the husband. Then, husband was called by the patient's sister to say that she  apparently was confused and in the grocery store and called her confused. She seemed to have difficulty remembering that her sister is in the hospital. She has been repeating herself to her husband asking what is going on with her sister and did not seem to recall that she was in the hospital and was just put in the hospital yesterday. For this reason she came to the ER. Currently, patient feels like she is completely at her baseline although husband feels like she still has some memory issues. On exam, she has no focality. No drift of the arms or legs. She was able to ambulate into the emergency department without difficulty per husband. She is able to tell me her name and age as well as current month although does tell me that it is 2021 rather than 2022. She does remember being at the supermarket today but does not remember talking with her sister and seems to have some memory gaps.doesn't know that sister becky   Past Medical History:     . Hyperlipidemia     . DVT  Anticoagulant use:  No  Antiplatelet use: No   Examination: BP(174/83), Pulse(96), Blood Glucose(113) 1A: Level of Consciousness - Alert; keenly responsive + 0 1B: Ask Month and Age - Both Questions Right + 0 1C: Blink Eyes & Squeeze Hands - Performs Both Tasks + 0 2: Test Horizontal Extraocular Movements - Normal + 0 3: Test Visual Fields - No Visual Loss + 0 4: Test Facial Palsy (Use Grimace if Obtunded) - Normal symmetry + 0 5A: Test Left Arm Motor Drift - No Drift for 10 Seconds + 0 5B: Test Right Arm Motor Drift - No Drift for 10 Seconds + 0 6A: Test Left Leg Motor Drift - No Drift for 5 Seconds + 0 6B: Test Right Leg Motor Drift - No Drift for 5 Seconds + 0 7: Test Limb  Ataxia (FNF/Heel-Shin) - No Ataxia + 0 8: Test Sensation - Normal; No sensory loss + 0 9: Test Language/Aphasia - Normal; No aphasia + 0 10: Test Dysarthria - Normal + 0 11: Test Extinction/Inattention - No abnormality + 0  NIHSS Score:  0   Patient / Family was informed the Neurology Consult would occur via TeleHealth consult by way of interactive audio and video telecommunications and consented to receiving care in this manner.  Patient is being evaluated for possible acute neurologic impairment and high probability of imminent or life - threatening deterioration.I spent total of 35 minutes providing care to this patient, including time for face to face visit via telemedicine, review of medical records, imaging studies and discussion of findings with providers, the patient and / or family.   Dr Lacie Scotts   [facility] [phone]  Case [id number]

## 2020-05-01 NOTE — H&P (Signed)
History and Physical    Kim Mclaughlin [id number] DOB: [birth year] DOA: 05/01/2020  PCP: Ann Held, DO  Patient coming from: Home  I have personally briefly reviewed patient's old medical records in [place]  Chief Complaint: Acute memory loss  HPI: Kim Mclaughlin is a 75 y.o. female with medical history significant for hyperlipidemia who presents to the ED for evaluation of acute memory loss.  History is limited from patient due to memory difficulty and is otherwise supplemented by EDP, chart review, and husband at bedside.  Patient was in her usual state of health morning of 05/01/2020.  She left home around 9:45 AM [place].  Husband states that she appeared to be her normal self at that time.  Around 2 PM patient got a phone call from her sister and was noted to have a significant change in mental status.  She appeared confused.  Her husband found her wandering at the shopping center and brought her to the ED for further evaluation.  Patient states that she [place] but does not recall anything after that.  She was noted to have repetitive questioning per husband.  She reports having a mild headache but otherwise denies any subjective fevers, chills, diaphoresis, nausea, vomiting, change in vision, chest pain, palpitations, dyspnea, cough, abdominal pain, dysuria, diarrhea, numbness/tingling, weakness of her extremities.  She did not have any dysarthria or apparent facial droop.  Husband states that patient's older sister had an unexpected medical emergency yesterday and is currently in [facility] in critical care.  Patient does not currently take any prescription medications.  She denies any recent tobacco, alcohol, or illicit drug use.  ED Course:  Initial vitals showed BP 180/86, pulse 106, RR 16, temp 98.44F, SPO2 96% on room air.  Labs showed sodium 140,  potassium 4.3, bicarb 23, BUN 17, creatinine 0.93, serum glucose 131, WBC 7.5, hemoglobin 14.8, platelets 361,000, serum ethanol <10.  CT head without contrast is negative for evidence of acute intracranial abnormality.  Stable mild cerebral atrophy is noted.  Teleneurology were consulted and recommended admission for observation, obtain MRI brain and EEG.  The hospitalist service was consulted to admit for further evaluation and management.  Review of Systems: All systems reviewed and are negative except as documented in history of present illness above.   Past Medical History:  Diagnosis Date  . Allergy   . Arthritis 07/2014   Right hand, prescribed prednisone-no longer taking. also Rt knww  . DVT (deep venous thrombosis) ([place]) 2015   very small; treated with compression and heat, no other problems since  . GERD (gastroesophageal reflux disease)    patient denies states she does have hx of peptic ulcer many years  . Hepatitis    in 10 th grade  (not sure type- not hep C) - treated  no other problems  . Hyperlipidemia    diet controlled  . MVP (mitral valve prolapse)    no problems, MD told patient she could not hear it  . Osteopenia    [place]  . PONV (postoperative nausea and vomiting)    with ear surgery only - No other problems with other surgeries  . Precancerous lesion 12/2014   Treated with cream only  . Varicose veins     Past Surgical History:  Procedure Laterality Date  . APPENDECTOMY    . BUNIONECTOMY WITH HAMMERTOE RECONSTRUCTION Left 04/22/2016  Procedure: MODIFIED MCBRIDE BUNIONECTOMY,  LEFT SECOND HAMMERTOE CORRECTION;  Surgeon: Wylene Simmer, MD;  Location: [facility];  Service: Orthopedics;  Laterality: Left;  . COLONOSCOPY     07/2012 Olevia Perches - [facility]/father - polyps  . colonoscopy with polypectomy  07/2012    hyperplastic;Dr Olevia Perches  . DILATION AND CURETTAGE OF UTERUS    . DILATION AND CURETTAGE OF UTERUS N/A 09/03/2014   Procedure: DILATATION AND  CURETTAGE;  Surgeon: Harle Battiest, MD;  Location[facility];  Service: Gynecology;  Laterality: N/A;  . ENDOVENOUS ABLATION SAPHENOUS VEIN W/ LASER Left 01-31-2014   EVLA  left greater saphenous vein  by Curt Jews MD  . ENDOVENOUS ABLATION SAPHENOUS VEIN W/ LASER Right 03-14-2014   endovenous laser ablation right greater saphenous vein  by Curt Jews MD  . FOOT ARTHRODESIS Left 04/22/2016   Procedure: LEFT FIRST TARSAL METATARSAL ARTHRODESIS;  Surgeon: Wylene Simmer, MD;  Location: [place];  Service: Orthopedics;  Laterality: Left;  Marland Kitchen G 3 P 3    . HYSTEROSCOPY WITH D & C N/A 03/25/2016   Procedure: DILATATION AND CURETTAGE /HYSTEROSCOPY;  Surgeon: Jerelyn Charles, MD;  Location: [facility];  Service: Gynecology;  Laterality: N/A;  WITH ULTRASOUND GUIDANCE  . left ear surgery    . STAPEDECTOMY    . TARSAL METATARSAL FUSION WITH WEIL OSTEOTOMY Left 04/22/2016   Procedure: 2-4 TARSAL METATARSAL FUSION WITH WEIL OSTEOTOMY;  Surgeon: Wylene Simmer, MD;  Location: [place];  Service: Orthopedics;  Laterality: Left;  . TUBAL LIGATION      Social History:  reports that she quit smoking about 57 years ago. Her smoking use included cigarettes. She has never used smokeless tobacco. She reports that she does not drink alcohol and does not use drugs.  Allergies  Allergen Reactions  . Macrobid  [Nitrofurantoin Macrocrystal] Other (See Comments)  . Doxycycline Hives  . Pravastatin Sodium     REACTION: shin pain  . Amoxicillin-Pot Clavulanate     GI symptoms; abdominal pain & nausea NOTE: able to take Ampicillin  . Augmentin [Amoxicillin-Pot Clavulanate] Nausea Only    Upsets stomach  . Codeine Nausea Only  . Methylphenidate Hcl Palpitations  . Methylprednisolone Rash    Rash and redness on face  . Sulfamethoxazole-Trimethoprim Nausea Only     nausea    Family History  Problem Relation Age of Onset  . Pancreatic cancer Mother   . Miscarriages /  [place] Mother   . Thyroid disease Mother        partial thyroidectomy ; S/P RAI  . Heart attack Father        in early 34s  . Colon cancer Father   . Colon cancer Paternal Grandmother   . Stroke Paternal Grandmother        > 65  . Heart attack Paternal Grandfather        in 24s  . Cancer Other        among paternal sibs (colon, breast, bone)  . Aneurysm Paternal Uncle        brain  . Ovarian cancer Maternal Aunt   . Breast cancer Paternal Aunt   . Diabetes Neg Hx   . Stomach cancer Neg Hx   . Ulcerative colitis Neg Hx      Prior to Admission medications   Medication Sig Start Date End Date Taking? Authorizing Provider  Ascorbic Acid (VITAMIN C) 1000 MG tablet Take 1,000 mg by mouth daily.   Yes [provider]  aspirin EC 81 MG tablet Take 81 mg by mouth daily.   Yes [provider]  cetirizine (ZYRTEC) 10 MG tablet Take 10 mg by mouth daily as needed for allergies.    Yes [provider]  cholecalciferol (VITAMIN D) 1000 units tablet Take 1,000 Units by mouth daily.   Yes [provider]  lidocaine (LIDODERM) 5 % Place 1 patch onto the skin daily. Remove & Discard patch within 12 hours or as directed by MD Patient taking differently: Place 1 patch onto the skin daily. 11/16/18  Yes Alvira Monday, MD  Multiple Vitamins-Minerals (CENTRUM SILVER PO) Take 1 tablet by mouth daily.   Yes [provider]  Omega-3 Fatty Acids (FISH OIL) 1000 MG CAPS Take 1 capsule by mouth daily.    Yes [provider]  Omega-3 Fatty Acids (FISH OIL) 1000 MG CPDR Take 1,000 mg by mouth daily.   Yes [provider]  OVER THE COUNTER MEDICATION Take 1 tablet by mouth 3 (three) times daily. Bone strength   Yes [provider]  zinc gluconate 50 MG tablet Take 50 mg by mouth daily.   Yes [provider]  ELDERBERRY PO Take 2 lozenges by mouth daily.     [provider]  Glucosamine HCl-MSM (MSM GLUCOSAMINE PO)  Take 2 tablets by mouth daily.     [provider]  meclizine (ANTIVERT) 25 MG tablet Take 1 tablet (25 mg total) by mouth 3 (three) times daily as needed for dizziness. Patient not taking: Reported on 05/01/2020 12/24/17   Deeann Saint, MD  OVER THE COUNTER MEDICATION Take 1 tablet by mouth 3 (three) times daily. [employer] Bone Strength supplement    [provider]  traMADol (ULTRAM) 50 MG tablet Take 1 tablet (50 mg total) by mouth every 6 (six) hours as needed. Patient not taking: No sig reported 11/16/18   Alvira Monday, MD  triamcinolone (NASACORT) 55 MCG/ACT AERO nasal inhaler Place 2 sprays into the nose daily as needed (allergies).     [provider]  triamcinolone cream (KENALOG) 0.1 % Apply 1 application topically 2 (two) times daily as needed (eczema.).     [provider]    Physical Exam: Vitals:   05/01/20 1705 05/01/20 1800 05/01/20 1830  BP: (!) 180/86 (!) 156/78 (!) 174/83  Pulse: (!) 106 91 97  Resp: 16 17 14   Temp: 98.5 F (36.9 C)    TempSrc: Oral    SpO2: 96% 98% 99%   Constitutional: Sitting up in bed, NAD, calm, comfortable Eyes: PERRL, lids and conjunctivae normal ENMT: Mucous membranes are moist. Posterior pharynx clear of any exudate or lesions.Normal dentition.  Neck: normal, supple, no masses. Respiratory: clear to auscultation bilaterally, no wheezing, no crackles. Normal respiratory effort. No accessory muscle use.  Cardiovascular: Regular rate and rhythm, no murmurs / rubs / gallops. No extremity edema. 2+ pedal pulses. Abdomen: no tenderness, no masses palpated. No hepatosplenomegaly. Bowel sounds positive.  Musculoskeletal: no clubbing / cyanosis. No joint deformity upper and lower extremities. Good ROM, no contractures. Normal muscle tone.  Skin: no rashes, lesions, ulcers. No induration Neurologic: CN 2-12 grossly intact. Sensation intact, Strength 5/5 in all 4.  Psychiatric: Awake, alert, and oriented to person,  place, and year.  She recognizes her husband at bedside.  She otherwise cannot provide recent history due to short-term memory loss.  She does not recall answering the same questions on repeat questioning just a few minutes later.  Labs on Admission: I have personally reviewed following labs and imaging studies  CBC: Recent Labs  Lab 05/01/20 1740  WBC 7.5  NEUTROABS 6.2  HGB 14.8  HCT 44.5  MCV 95.3  PLT [id number]   Basic Metabolic Panel: Recent Labs  Lab 05/01/20 1740  NA 140  K 4.3  CL 104  CO2 23  GLUCOSE 131*  BUN 17  CREATININE 0.93  CALCIUM 10.5*   GFR: CrCl cannot be calculated (Unknown ideal weight.). Liver Function Tests: No results for input(s): AST, ALT, ALKPHOS, BILITOT, PROT, ALBUMIN in the last 168 hours. No results for input(s): LIPASE, AMYLASE in the last 168 hours. No results for input(s): AMMONIA in the last 168 hours. Coagulation Profile: Recent Labs  Lab 05/01/20 1740  INR 1.0   Cardiac Enzymes: No results for input(s): CKTOTAL, CKMB, CKMBINDEX, TROPONINI in the last 168 hours. BNP (last 3 results) No results for input(s): PROBNP in the last 8760 hours. HbA1C: No results for input(s): HGBA1C in the last 72 hours. CBG: Recent Labs  Lab 05/01/20 1723  GLUCAP 113*   Lipid Profile: No results for input(s): CHOL, HDL, LDLCALC, TRIG, CHOLHDL, LDLDIRECT in the last 72 hours. Thyroid Function Tests: No results for input(s): TSH, T4TOTAL, FREET4, T3FREE, THYROIDAB in the last 72 hours. Anemia Panel: No results for input(s): VITAMINB12, FOLATE, FERRITIN, TIBC, IRON, RETICCTPCT in the last 72 hours. Urine analysis:    Component Value Date/Time   COLORURINE YELLOW 11/27/2015 2000   APPEARANCEUR CLEAR 11/27/2015 2000   LABSPEC 1.008 11/27/2015 2000   PHURINE 6.0 11/27/2015 2000   GLUCOSEU NEGATIVE 11/27/2015 2000   GLUCOSEU NEGATIVE 06/30/2015 0932   HGBUR MODERATE (A) 11/27/2015 2000   BILIRUBINUR neg 08/09/2016 0844   KETONESUR 15 (A)  11/27/2015 2000   PROTEINUR trace 08/09/2016 0844   PROTEINUR NEGATIVE 11/27/2015 2000   UROBILINOGEN 0.2 08/09/2016 0844   UROBILINOGEN 0.2 06/30/2015 0932   NITRITE neg 08/09/2016 0844   NITRITE NEGATIVE 11/27/2015 2000   LEUKOCYTESUR Trace (A) 08/09/2016 0844    Radiological Exams on Admission: CT Head Wo Contrast  Result Date: 05/01/2020 CLINICAL DATA:  Neuro deficit, acute, stroke suspected. Additional history provided: Confusion, dizziness. EXAM: CT HEAD WITHOUT CONTRAST TECHNIQUE: Contiguous axial images were obtained from the base of the skull through the vertex without intravenous contrast. COMPARISON:  Prior head CT 11/16/2018. FINDINGS: Brain: Mild cerebral atrophy. There is no acute intracranial hemorrhage. No demarcated cortical infarct. No extra-axial fluid collection. No evidence of intracranial mass. No midline shift. Partially empty sella turcica. Vascular: No hyperdense vessel.  Atherosclerotic calcifications. Skull: Normal. Negative for fracture or focal lesion. Sinuses/Orbits: Visualized orbits show no acute finding. No significant paranasal sinus disease at the imaged levels. IMPRESSION: No evidence of acute intracranial abnormality. Stable, mild cerebral atrophy. Electronically Signed   By: Kellie Simmering DO   On: 05/01/2020 18:51    EKG: Personally reviewed. Normal sinus rhythm without acute ischemic changes.  Not significantly changed when compared to prior.  Assessment/Plan Principal Problem:   Transient global amnesia Active Problems:   Hyperlipidemia  RINDY REAUME is a 75 y.o. female with medical history significant for hyperlipidemia who is admitted for acute memory loss suspicious for transient global amnesia.  Transient global amnesia: Presentation suspicious for transient global amnesia.  Cannot rule out stroke or seizure activity and therefore further work-up indicated.  Has ongoing short-term memory difficulty on admission.  No obvious infection at this  time.  Seen by teleneurology in the ED. -MRI brain  without contrast -EEG -Follow TSH, B12, folate, ammonia, urinalysis  Elevated blood pressure: No formal history of hypertension.  Allowing permissive hypertension for now pending MRI brain.  Hyperlipidemia: Patient not currently on statin therapy.  DVT prophylaxis: Lovenox Code Status: Full code, confirmed with patient Family Communication: Discussed with patient's husband at bedside Disposition Plan: From home and likely discharge to home pending improvement in mental status and further work-up Consults called: Teleneurology Admission status:  Status is: Observation  The patient remains OBS appropriate and will d/c before 2 midnights.  Dispo: The patient is from: Home              Anticipated d/c is to: Home              Anticipated d/c date is: 1 day              Patient currently is not medically stable to d/c.   Zada Finders MD [facility]  If 7PM-7AM, please contact night-coverage [email or web address]  05/01/2020, 8:47 PM

## 2020-05-01 NOTE — ED Triage Notes (Signed)
Per husband, states she got her hair done today-states sister called patient and noticed she was confused and altered-sister called husband who found her at [employer] center-patient did not know where her car was packed-did not remember her other sister is hospitalized at [facility] and is in critical condition-patient oriented to self

## 2020-05-01 NOTE — ED Provider Notes (Signed)
[facility] DEPT Provider Note   CSN: [id number] Arrival date & time: 05/01/20  1654     History Chief Complaint  Patient presents with  . Altered Mental Status    Kim Mclaughlin is a 75 y.o. female.  Patient is a 75 year old female with a history of hyperlipidemia who is presenting today with her husband for acute memory loss and not acting herself.  He reports at 10 PM today she was her normal self and left the house to go get her hair done.  Her sister called her around 2:00 this afternoon and she was not herself.  She cannot remember where she was at or any of the events within the last 24 to 48 hours.  She could not remember what she had done today which is all very unusual for her.  Her [facility] and brought her here for further evaluation.  They did report there was a significant family event yesterday where her sister was hospitalized and is currently on the ventilator.  However she did seem normal this morning.  Patient denies any headache, neck pain, unilateral numbness or weakness.  She has not had any difficulty walking or speech issues.  She has had no recent medication changes.  Husband reports she has never had an episode like this before.  The history is provided by the patient.  Altered Mental Status Presenting symptoms: memory loss        Past Medical History:  Diagnosis Date  . Allergy   . Arthritis 07/2014   Right hand, prescribed prednisone-no longer taking. also Rt knww  . DVT (deep venous thrombosis) ([place]) 2015   very small; treated with compression and heat, no other problems since  . GERD (gastroesophageal reflux disease)    patient denies states she does have hx of peptic ulcer many years  . Hepatitis    in 10 th grade  (not sure type- not hep C) - treated  no other problems  . Hyperlipidemia    diet controlled  . MVP (mitral valve prolapse)    no problems, MD told patient she could not  hear it  . Osteopenia    [place]  . PONV (postoperative nausea and vomiting)    with ear surgery only - No other problems with other surgeries  . Precancerous lesion 12/2014   Treated with cream only  . Varicose veins     Patient Active Problem List   Diagnosis Date Noted  . Depression, major, single episode, moderate ([place]) 08/05/2017  . Loss of transverse plantar arch 10/10/2015  . Arthritis of foot, degenerative 07/16/2015  . Wellness examination 06/30/2015  . Varicose veins of lower extremities with complications [id number]  . Varicose veins of lower extremities with other complications [id number]  . Chronic venous insufficiency 10/05/2013  . Hyperplastic colonic polyp 08/15/2013  . Unspecified adverse effect of unspecified drug, medicinal and biological substance 07/05/2011  . ARTHRALGIA 10/13/2009  . HEMIANOPSIA, HOMONYMOUS, LEFT 10/24/2008  . VARICOSE VEINS, LOWER EXTREMITIES 10/24/2008  . Osteoporosis 02/29/2008  . Hyperlipidemia 07/25/2007  . MITRAL VALVE PROLAPSE 05/09/2007  . ABNORMAL HEART RHYTHMS 05/09/2007  . ADD 02/28/2007    Past Surgical History:  Procedure Laterality Date  . APPENDECTOMY    . BUNIONECTOMY WITH HAMMERTOE RECONSTRUCTION Left 04/22/2016   Procedure: MODIFIED MCBRIDE BUNIONECTOMY,  LEFT SECOND HAMMERTOE CORRECTION;  Surgeon: Wylene Simmer, MD;  Location: [facility];  Service: Orthopedics;  Laterality: Left;  . COLONOSCOPY  07/2012 [facility] - [facility]/father - polyps  . colonoscopy with polypectomy  07/2012    hyperplastic;Dr Juanda Chance  . DILATION AND CURETTAGE OF UTERUS    . DILATION AND CURETTAGE OF UTERUS N/A 09/03/2014   Procedure: DILATATION AND CURETTAGE;  Surgeon: Donovan Kail, MD;  Location: [facility];  Service: Gynecology;  Laterality: N/A;  . ENDOVENOUS ABLATION SAPHENOUS VEIN W/ LASER Left 01-31-2014   EVLA  left greater saphenous vein  by Gretta Began MD  . ENDOVENOUS ABLATION SAPHENOUS VEIN W/ LASER Right 03-14-2014    endovenous laser ablation right greater saphenous vein  by Gretta Began MD  . FOOT ARTHRODESIS Left 04/22/2016   Procedure: LEFT FIRST TARSAL METATARSAL ARTHRODESIS;  Surgeon: Toni Arthurs, MD;  Location: [facility];  Service: Orthopedics;  Laterality: Left;  Marland Kitchen G 3 P 3    . HYSTEROSCOPY WITH D & C N/A 03/25/2016   Procedure: DILATATION AND CURETTAGE /HYSTEROSCOPY;  Surgeon: Marlow Baars, MD;  Location: [facility];  Service: Gynecology;  Laterality: N/A;  WITH ULTRASOUND GUIDANCE  . left ear surgery    . STAPEDECTOMY    . TARSAL METATARSAL FUSION WITH WEIL OSTEOTOMY Left 04/22/2016   Procedure: 2-4 TARSAL METATARSAL FUSION WITH WEIL OSTEOTOMY;  Surgeon: Toni Arthurs, MD;  Location: [facility];  Service: Orthopedics;  Laterality: Left;  . TUBAL LIGATION       OB History   No obstetric history on file.     Family History  Problem Relation Age of Onset  . Pancreatic cancer Mother   . Miscarriages / [place] Mother   . Thyroid disease Mother        partial thyroidectomy ; S/P RAI  . Heart attack Father        in early 44s  . Colon cancer Father   . Colon cancer Paternal Grandmother   . Stroke Paternal Grandmother        > 65  . Heart attack Paternal Grandfather        in 11s  . Cancer Other        among paternal sibs (colon, breast, bone)  . Aneurysm Paternal Uncle        brain  . Ovarian cancer Maternal Aunt   . Breast cancer Paternal Aunt   . Diabetes Neg Hx   . Stomach cancer Neg Hx   . Ulcerative colitis Neg Hx     Social History   Tobacco Use  . Smoking status: Former Smoker    Types: Cigarettes    Quit date: 04/27/1963    Years since quitting: 57.0  . Smokeless tobacco: Never Used  . Tobacco comment: Smoked < 6 mos;< 1 pack total;quit 1965  Vaping Use  . Vaping Use: Never used  Substance Use Topics  . Alcohol use: No  . Drug use: No    Home Medications Prior to Admission medications   Medication Sig Start Date End Date Taking?  Authorizing Provider  aspirin EC 81 MG tablet Take 81 mg by mouth daily.    [provider]  cetirizine (ZYRTEC) 10 MG tablet Take 10 mg by mouth daily as needed for allergies.     [provider]  cholecalciferol (VITAMIN D) 1000 units tablet Take 1,000 Units by mouth daily.    [provider]  ECHINACEA EXTRACT PO Take 3 tablets by mouth daily. Patient not taking: Reported on 12/28/2019    [provider]  ELDERBERRY PO Take 2 lozenges by mouth daily.  [provider]  Glucosamine HCl-MSM (MSM GLUCOSAMINE PO) Take 2 tablets by mouth daily.     [provider]  lidocaine (LIDODERM) 5 % Place 1 patch onto the skin daily. Remove & Discard patch within 12 hours or as directed by MD 11/16/18   Gareth Morgan, MD  meclizine (ANTIVERT) 25 MG tablet Take 1 tablet (25 mg total) by mouth 3 (three) times daily as needed for dizziness. 12/24/17   Billie Ruddy, MD  Multiple Vitamins-Minerals (CENTRUM SILVER PO) Take 1 tablet by mouth daily.    [provider]  Omega-3 Fatty Acids (FISH OIL) 1000 MG CAPS Take 1 capsule by mouth daily.     [provider]  OVER THE COUNTER MEDICATION Take 1 tablet by mouth 3 (three) times daily. [place] Bone Strength supplement    [provider]  traMADol (ULTRAM) 50 MG tablet Take 1 tablet (50 mg total) by mouth every 6 (six) hours as needed. Patient not taking: Reported on 12/28/2019 11/16/18   Gareth Morgan, MD  triamcinolone (NASACORT) 55 MCG/ACT AERO nasal inhaler Place 2 sprays into the nose daily as needed (allergies).     [provider]  triamcinolone cream (KENALOG) 0.1 % Apply 1 application topically 2 (two) times daily as needed (eczema.).     [provider]    Allergies    Macrobid  [nitrofurantoin macrocrystal], Doxycycline, Pravastatin sodium, Amoxicillin-pot clavulanate, Augmentin [amoxicillin-pot clavulanate], Codeine, Methylphenidate hcl, Methylprednisolone,  and Sulfamethoxazole-trimethoprim  Review of Systems   Review of Systems  Psychiatric/Behavioral: Positive for memory loss.  All other systems reviewed and are negative.   Physical Exam Updated Vital Signs BP (!) 180/86 (BP Location: Left Arm)   Pulse (!) 106   Temp 98.5 F (36.9 C) (Oral)   Resp 16   SpO2 96%   Physical Exam Vitals and nursing note reviewed.  Constitutional:      General: She is not in acute distress.    Appearance: Normal appearance. She is well-developed, normal weight and well-nourished.  HENT:     Head: Normocephalic and atraumatic.     Right Ear: Tympanic membrane normal.     Left Ear: Tympanic membrane normal.     Mouth/Throat:     Mouth: Oropharynx is clear and moist. Mucous membranes are moist.  Eyes:     General: No visual field deficit.    Extraocular Movements: EOM normal.     Conjunctiva/sclera: Conjunctivae normal.     Pupils: Pupils are equal, round, and reactive to light.  Cardiovascular:     Rate and Rhythm: Regular rhythm. Tachycardia present.     Pulses: Normal pulses and intact distal pulses.     Heart sounds: No murmur heard.   Pulmonary:     Effort: Pulmonary effort is normal. No respiratory distress.     Breath sounds: Normal breath sounds. No wheezing or rales.  Abdominal:     General: There is no distension.     Palpations: Abdomen is soft.     Tenderness: There is no abdominal tenderness. There is no guarding or rebound.  Musculoskeletal:        General: No tenderness or edema. Normal range of motion.     Cervical back: Normal range of motion and neck supple.     Right lower leg: No edema.     Left lower leg: No edema.  Skin:    General: Skin is warm and dry.     Findings: No erythema or rash.  Neurological:  Mental Status: She is alert.     Cranial Nerves: Cranial nerves are intact. No dysarthria or facial asymmetry.     Sensory: Sensation is intact.     Motor: Motor function is intact. No weakness or pronator  drift.     Coordination: Coordination is intact.     Gait: Gait is intact.     Comments: Oriented to person however cannot tell me the year but does know that it is January.  She is unable to tell me the president and said [employer].  She is able to recall her birthdate and all 3 of her children's birth dates.  She does recognize her husband.  Psychiatric:        Mood and Affect: Mood and affect normal.     Comments: Calm and cooperative     ED Results / Procedures / Treatments   Labs (all labs ordered are listed, but only abnormal results are displayed) Labs Reviewed  BASIC METABOLIC PANEL - Abnormal; Notable for the following components:      Result Value   Glucose, Bld 131 (*)    Calcium 10.5 (*)    All other components within normal limits  CBG MONITORING, ED - Abnormal; Notable for the following components:   Glucose-Capillary 113 (*)    All other components within normal limits  CBC WITH DIFFERENTIAL/PLATELET  ETHANOL  PROTIME-INR  APTT  URINALYSIS, ROUTINE W REFLEX MICROSCOPIC    EKG EKG Interpretation  Date/Time:  Thursday May 01 2020 17:44:04 EST Ventricular Rate:  92 PR Interval:    QRS Duration: 86 QT Interval:  359 QTC Calculation: 445 R Axis:   64 Text Interpretation: Sinus rhythm No significant change since last tracing Confirmed by Gwyneth Sprout ([id number]) on 05/01/2020 6:01:18 PM   Radiology CT Head Wo Contrast  Result Date: 05/01/2020 CLINICAL DATA:  Neuro deficit, acute, stroke suspected. Additional history provided: Confusion, dizziness. EXAM: CT HEAD WITHOUT CONTRAST TECHNIQUE: Contiguous axial images were obtained from the base of the skull through the vertex without intravenous contrast. COMPARISON:  Prior head CT 11/16/2018. FINDINGS: Brain: Mild cerebral atrophy. There is no acute intracranial hemorrhage. No demarcated cortical infarct. No extra-axial fluid collection. No evidence of intracranial mass. No midline shift. Partially empty sella  turcica. Vascular: No hyperdense vessel.  Atherosclerotic calcifications. Skull: Normal. Negative for fracture or focal lesion. Sinuses/Orbits: Visualized orbits show no acute finding. No significant paranasal sinus disease at the imaged levels. IMPRESSION: No evidence of acute intracranial abnormality. Stable, mild cerebral atrophy. Electronically Signed   By: Jackey Loge DO   On: 05/01/2020 18:51    Procedures Procedures (including critical care time)  Medications Ordered in ED Medications - No data to display  ED Course  I have reviewed the triage vital signs and the nursing notes.  Pertinent labs & imaging results that were available during my care of the patient were reviewed by me and considered in my medical decision making (see chart for details).    MDM Rules/Calculators/A&P                          Patient presenting today with an acute memory loss.  Patient was last seen normal at 10 AM by her husband her sister called her at 2 and she was confused.  Patient cannot remember anything from today or events of yesterday.  She also is unable to recall the year or the president.  This happened acutely.  Patient is normally  high functioning living independently with her husband and has a history of hyperlipidemia but no other significant medical issues.  She denies a headache or neck pain.  She has no focal neurologic symptoms but does have memory loss.  Spoke with Dr. Lorrin Goodell about the case and he recommended doing a teleneurology evaluation.  At this time she is not a code stroke candidate as her last seen normal was approximately almost 6 hours ago and she does not display symptoms of LVO.  Appreciate neurology's evaluation and input.  Blood sugar is within normal limits.  Vital signs with hypertension blood pressure of 180/86 and mild tachycardia of 106.  Patient is in no acute distress at this time.  Labs and imaging are pending.  7:22 PM Pt seen by neurology.  Head CT neg.  Labs  are reassuring.  They are requesting admission, eeg and MRI but doubt stroke at this time.  However may be possible minimal seizure vs transient global amnesia.    Final Clinical Impression(s) / ED Diagnoses Final diagnoses:  Memory loss    Rx / DC Orders ED Discharge Orders    None       Blanchie Dessert, MD 05/01/20 1934

## 2020-05-01 NOTE — ED Notes (Signed)
Provider in room with pt.

## 2020-05-02 ENCOUNTER — Other Ambulatory Visit: Payer: Self-pay | Admitting: Internal Medicine

## 2020-05-02 ENCOUNTER — Encounter (HOSPITAL_COMMUNITY): Payer: Self-pay | Admitting: Internal Medicine

## 2020-05-02 ENCOUNTER — Observation Stay (HOSPITAL_COMMUNITY): Payer: Medicare PPO

## 2020-05-02 ENCOUNTER — Observation Stay (HOSPITAL_COMMUNITY)
Admit: 2020-05-02 | Discharge: 2020-05-02 | Disposition: A | Discharge status: Home / Self Care | Payer: Medicare PPO | Attending: Internal Medicine | Admitting: Internal Medicine

## 2020-05-02 DIAGNOSIS — E782 Mixed hyperlipidemia: Secondary | ICD-10-CM

## 2020-05-02 DIAGNOSIS — I672 Cerebral atherosclerosis: Secondary | ICD-10-CM | POA: Diagnosis not present

## 2020-05-02 DIAGNOSIS — M26603 Bilateral temporomandibular joint disorder, unspecified: Secondary | ICD-10-CM | POA: Diagnosis not present

## 2020-05-02 DIAGNOSIS — G454 Transient global amnesia: Secondary | ICD-10-CM

## 2020-05-02 DIAGNOSIS — I729 Aneurysm of unspecified site: Secondary | ICD-10-CM

## 2020-05-02 DIAGNOSIS — I6523 Occlusion and stenosis of bilateral carotid arteries: Secondary | ICD-10-CM | POA: Diagnosis not present

## 2020-05-02 DIAGNOSIS — R29818 Other symptoms and signs involving the nervous system: Secondary | ICD-10-CM | POA: Diagnosis not present

## 2020-05-02 LAB — LIPID PANEL
Cholesterol: 250 mg/dL — ABNORMAL HIGH (ref 0–200)
HDL: 85 mg/dL (ref >40)
LDL Cholesterol: 156 mg/dL — ABNORMAL HIGH (ref 0–99)
Total CHOL/HDL Ratio: 2.9 RATIO
Triglycerides: 44 mg/dL (ref <150)
VLDL: 9 mg/dL (ref 0–40)

## 2020-05-02 LAB — BASIC METABOLIC PANEL
Anion gap: 11 (ref 5–15)
BUN: 16 mg/dL (ref 8–23)
CO2: 25 mmol/L (ref 22–32)
Calcium: 9.4 mg/dL (ref 8.9–10.3)
Chloride: 104 mmol/L (ref 98–111)
Creatinine, Ser: 0.9 mg/dL (ref 0.44–1.00)
GFR, Estimated: >60 mL/min (ref >60)
Glucose, Bld: 101 mg/dL — ABNORMAL HIGH (ref 70–99)
Potassium: 4.7 mmol/L (ref 3.5–5.1)
Sodium: 140 mmol/L (ref 135–145)

## 2020-05-02 LAB — SARS CORONAVIRUS 2 (TAT 6-24 HRS): SARS Coronavirus 2: NEGATIVE

## 2020-05-02 LAB — HEMOGLOBIN A1C
Hgb A1c MFr Bld: 5.7 % — ABNORMAL HIGH (ref 4.8–5.6)
Mean Plasma Glucose: 116.89 mg/dL

## 2020-05-02 LAB — TSH: TSH: 1.302 u[IU]/mL (ref 0.350–4.500)

## 2020-05-02 MED ORDER — IOHEXOL 350 MG/ML SOLN
100.0000 mL | Freq: Once | INTRAVENOUS | Status: AC | PRN
  Administered 2020-05-02: 100 mL via INTRAVENOUS

## 2020-05-02 MED ORDER — ATORVASTATIN CALCIUM 80 MG PO TABS: 80.0000 mg | ORAL_TABLET | Freq: Every day | ORAL | 3 refills | Status: DC

## 2020-05-02 NOTE — Hospital Course (Signed)
Kim Mclaughlin is a 75 y.o. female with medical history significant for hyperlipidemia who presented to the ED for evaluation of acute memory loss.  History is limited from patient due to memory difficulty and is otherwise supplemented by EDP, chart review, and husband at bedside.   Patient was in her usual state of health morning of 05/01/2020.  She left home around 9:45 AM to go [place] [facility] [place] to [place] some errands.  Husband states that she appeared to be her normal self at that time.  Around 2 PM patient got a phone call from her sister and was noted to have a significant change in mental status.  She appeared confused.  Her husband found her wandering [place] and brought her to the ED for further evaluation.   Patient states that she remembers going to the [facility] but does not recall anything after that.  She was noted to have repetitive questioning per husband.  She reports having a mild headache but otherwise denies any subjective fevers, chills, diaphoresis, nausea, vomiting, change in vision, chest pain, palpitations, dyspnea, cough, abdominal pain, dysuria, diarrhea, numbness/tingling, weakness of her extremities.  She did not have any dysarthria or apparent facial droop.   Patient does not currently take any prescription medications.  She denies any recent tobacco, alcohol, or illicit drug use.  Lab work-up was unremarkable.   CT head without contrast was negative for evidence of acute intracranial abnormality.  Stable mild cerebral atrophy is noted.  MRI brain was also negative for acute stroke or acute abnormalities. CTA head/neck also obtained which was negative for acute abnormalities. There was no LVO or hemodynamically significant stenosis in the head or neck. There was noted outpouching (2 to 3 mm) from the undersurface of the bilateral supraclinoid ICA concerning for possible aneurysm versus infundibulum. She is recommended to have  outpatient monitoring and follow-up with neurology and possibly neurosurgery referral for ongoing surveillance.  An echo was also ordered at discharge and recommended to be performed within 1 week per neurology. She is also started on Lipitor and was counseled on possible myalgias given history of statin allergy. She is on aspirin 81 mg daily at home and was instructed to continue on this as well.

## 2020-05-02 NOTE — ED Notes (Signed)
Patient is resting comfortably. 

## 2020-05-02 NOTE — Assessment & Plan Note (Signed)
-  Started on Lipitor 80 mg daily. -Outpatient follow-up for repeat LFTs and monitoring for development of myalgias and tolerance

## 2020-05-02 NOTE — Discharge Instructions (Signed)
Follow up with your primary care physician after discharge  If you do not hear from cardiology about the heart echo, then call the number listed under your follow up appointments for [place] group  Follow up with neurology. Your CT angio head did show a potential 2-3 mm aneurysm. This will just need routine surveillance, and you can be referred to neurosurgery for monitoring after follow up outpatient.

## 2020-05-02 NOTE — ED Notes (Signed)
Vital signs stable. 

## 2020-05-02 NOTE — Procedures (Signed)
Patient Name: Kim Mclaughlin  MRN: [id number]  Epilepsy Attending: Lora Havens  Referring Physician/Provider: Dr. Zada Finders Date: 05/02/2020 Duration: 24.46 mins  Patient history: 75 year old female with transient global amnesia.  EEG to evaluate for seizures.  Level of alertness: Awake, asleep  AEDs during EEG study: None  Technical aspects: This EEG study was done with scalp electrodes positioned according to the 10-20 International system of electrode placement. Electrical activity was acquired at a sampling rate of 500Hz  and reviewed with a high frequency filter of 70Hz  and a low frequency filter of 1Hz . EEG data were recorded continuously and digitally stored.   Description: The posterior dominant rhythm consists of 9-10 Hz activity of moderate voltage (25-35 uV) seen predominantly in posterior head regions, symmetric and reactive to eye opening and eye closing. Sleep was characterized by vertex waves, sleep spindles (12 to 14 Hz), maximal frontocentral region.  Hyperventilation and photic stimulation were not performed.     IMPRESSION: This study is within normal limits. No seizures or epileptiform discharges were seen throughout the recording.  Orit [place] [name]

## 2020-05-02 NOTE — Consult Note (Addendum)
NEUROLOGY CONSULTATION NOTE   Date of service: May 02, 2020 Patient Name: Kim Mclaughlin MRN:  [id number] DOB:  [birth year] Reason for consult: "TGA" _ _ _   _ __   _ __ _ _  __ __   _ __   __ _  History of Present Illness  Kim Mclaughlin is a 75 y.o. female with PMH significant for hyperlipidemia, hepatitis, osteopenia, history of DVT, GERD who presents with an acute episode of short-term memory loss.  Patient reports yesterday she was shopping around when this happened.  She only remembers events there were told to her by her sister over the phone or heard from her husband.  Per husband who is at the bedside and provides most of the history.  He reports that patient's sister called her on her phone and noted the patient appeared "confused".  On further discussion, patient had no idea that her sister was admitted to the hospital, she did not know where her car was and did not know what she was doing.  Husband went down to get her and noted that she kept repeating herself multiple times and asking the same question again and again.  This continued for about an hour to hour and a half before it finally resolved.  Patient has no recollection of this episode at all.  She was seen in the emergency department and was awake alert and talkative.  She was seen by teleneurology but by that time her episode was resolving and she did not have any focal findings.  Work-up with MRI brain without contrast with no acute stroke, routine EEG with no epileptogenic abnormalities.  Ammonia levels were normal, TSH was normal, B12 and folate levels were normal.  On my evaluation patient is pretty much back to her baseline.  No prior history of strokes.  No prior history of seizures.  No family history of strokes.  She does not smoke.  She does not drink alcohol or use any recreational substances.  She takes aspirin 81 mg daily at home and has been compliant with it.  She denies any prior history of diagnosis of A.  fib.  Endorses history of hyperlipidemia.  She denies any current symptoms at all.  No pain.   ROS   Constitutional Denies weight loss, fever and chills.   HEENT Denies changes in vision and hearing.   Respiratory Denies SOB and cough.   CV Denies palpitations and CP   GI Denies abdominal pain, nausea, vomiting and diarrhea.   GU Denies dysuria and urinary frequency.   MSK Denies myalgia and joint pain.   Skin Denies rash and pruritus.   Neurological Denies headache and syncope.   Psychiatric Denies recent changes in mood. Denies anxiety and depression.    Past History   Past Medical History:  Diagnosis Date  . Allergy   . Arthritis 07/2014   Right hand, prescribed prednisone-no longer taking. also Rt knww  . DVT (deep venous thrombosis) ([place]) 2015   very small; treated with compression and heat, no other problems since  . GERD (gastroesophageal reflux disease)    patient denies states she does have hx of peptic ulcer many years  . Hepatitis    in 10 th grade  (not sure type- not hep C) - treated  no other problems  . Hyperlipidemia    diet controlled  . MVP (mitral valve prolapse)    no problems, MD told patient she could not hear it  . Osteopenia  [place]  . PONV (postoperative nausea and vomiting)    with ear surgery only - No other problems with other surgeries  . Precancerous lesion 12/2014   Treated with cream only  . Varicose veins    Past Surgical History:  Procedure Laterality Date  . APPENDECTOMY    . BUNIONECTOMY WITH HAMMERTOE RECONSTRUCTION Left 04/22/2016   Procedure: MODIFIED MCBRIDE BUNIONECTOMY,  LEFT SECOND HAMMERTOE CORRECTION;  Surgeon: Wylene Simmer, MD;  Location: [facility];  Service: Orthopedics;  Laterality: Left;  . COLONOSCOPY     07/2012 Olevia Perches - [facility]/father - polyps  . colonoscopy with polypectomy  07/2012    hyperplastic;Dr Olevia Perches  . DILATION AND CURETTAGE OF UTERUS    . DILATION AND CURETTAGE OF UTERUS N/A 09/03/2014    Procedure: DILATATION AND CURETTAGE;  Surgeon: Harle Battiest, MD;  Location: [facility];  Service: Gynecology;  Laterality: N/A;  . ENDOVENOUS ABLATION SAPHENOUS VEIN W/ LASER Left 01-31-2014   EVLA  left greater saphenous vein  by Curt Jews MD  . ENDOVENOUS ABLATION SAPHENOUS VEIN W/ LASER Right 03-14-2014   endovenous laser ablation right greater saphenous vein  by Curt Jews MD  . FOOT ARTHRODESIS Left 04/22/2016   Procedure: LEFT FIRST TARSAL METATARSAL ARTHRODESIS;  Surgeon: Wylene Simmer, MD;  Location: [facility];  Service: Orthopedics;  Laterality: Left;  Marland Kitchen G 3 P 3    . HYSTEROSCOPY WITH D & C N/A 03/25/2016   Procedure: DILATATION AND CURETTAGE /HYSTEROSCOPY;  Surgeon: Jerelyn Charles, MD;  Location: [facility];  Service: Gynecology;  Laterality: N/A;  WITH ULTRASOUND GUIDANCE  . left ear surgery    . STAPEDECTOMY    . TARSAL METATARSAL FUSION WITH WEIL OSTEOTOMY Left 04/22/2016   Procedure: 2-4 TARSAL METATARSAL FUSION WITH WEIL OSTEOTOMY;  Surgeon: Wylene Simmer, MD;  Location: [facility];  Service: Orthopedics;  Laterality: Left;  . TUBAL LIGATION     Family History  Problem Relation Age of Onset  . Pancreatic cancer Mother   . Miscarriages / [place] Mother   . Thyroid disease Mother        partial thyroidectomy ; S/P RAI  . Heart attack Father        in early 10s  . Colon cancer Father   . Colon cancer Paternal Grandmother   . Stroke Paternal Grandmother        > 65  . Heart attack Paternal Grandfather        in 28s  . Cancer Other        among paternal sibs (colon, breast, bone)  . Aneurysm Paternal Uncle        brain  . Ovarian cancer Maternal Aunt   . Breast cancer Paternal Aunt   . Diabetes Neg Hx   . Stomach cancer Neg Hx   . Ulcerative colitis Neg Hx    Social History   Socioeconomic History  . Marital status: Married    Spouse name: Not on file  . Number of children: Not on file  . Years of education: Not on file   . Highest education level: Not on file  Occupational History  . Occupation: retired  Tobacco Use  . Smoking status: Former Smoker    Types: Cigarettes    Quit date: 04/27/1963    Years since quitting: 57.0  . Smokeless tobacco: Never Used  . Tobacco comment: Smoked < 6 mos;< 1 pack total;quit 1965  Vaping Use  . Vaping Use: Never used  Substance and Sexual Activity  . Alcohol use: No  . Drug use: No  . Sexual activity: Yes    Birth control/protection: Post-menopausal  Other Topics Concern  . Not on file  Social History Narrative   Exercise-  Walks on Mondays and Wednesdays on the treadmill   Social Determinants of Health   Financial Resource Strain: Low Risk   . Difficulty of Paying Living Expenses: Not hard at all  Food Insecurity: No Food Insecurity  . Worried About Charity fundraiser in the Last Year: Never true  . Ran Out of Food in the Last Year: Never true  Transportation Needs: No Transportation Needs  . Lack of Transportation (Medical): No  . Lack of Transportation (Non-Medical): No  Physical Activity: Not on file  Stress: Not on file  Social Connections: Not on file   Allergies  Allergen Reactions  . Macrobid  [Nitrofurantoin Macrocrystal] Other (See Comments)  . Doxycycline Hives  . Pravastatin Sodium     REACTION: shin pain  . Amoxicillin-Pot Clavulanate     GI symptoms; abdominal pain & nausea NOTE: able to take Ampicillin  . Augmentin [Amoxicillin-Pot Clavulanate] Nausea Only    Upsets stomach  . Codeine Nausea Only  . Methylphenidate Hcl Palpitations  . Methylprednisolone Rash    Rash and redness on face  . Sulfamethoxazole-Trimethoprim Nausea Only     nausea    Medications  (Not in a hospital admission)    Vitals   Vitals:   05/02/20 0500 05/02/20 0807 05/02/20 1200 05/02/20 1500  BP: 123/61 131/74 (!) 154/77 (!) 124/58  Pulse: 65 82 80 80  Resp: 14 15 18 16   Temp:      TempSrc:      SpO2: 97% 96% 98% 96%     There is no height or  weight on file to calculate BMI.  Physical Exam   General: Laying comfortably in bed; in no acute distress.  HENT: Normal oropharynx and mucosa. Normal external appearance of ears and nose.  Neck: Supple, no pain or tenderness  CV: No JVD. No peripheral edema.  Pulmonary: Symmetric Chest rise. Normal respiratory effort.  Abdomen: Soft to touch, non-tender.  Ext: No cyanosis, edema, or deformity  Skin: No rash. Normal palpation of skin.   Musculoskeletal: Normal digits and nails by inspection. No clubbing.   Neurologic Examination  Mental status/Cognition: Alert, oriented to self, place, month and year, good attention.  Speech/language: Fluent, comprehension intact, object naming intact, repetition intact.  Cranial nerves:   CN II Pupils equal and reactive to light, no VF deficits    CN III,IV,VI EOM intact, no gaze preference or deviation, no nystagmus    CN V normal sensation in V1, V2, and V3 segments bilaterally    CN VII no asymmetry, no nasolabial fold flattening    CN VIII normal hearing to speech    CN IX & X normal palatal elevation, no uvular deviation    CN XI 5/5 head turn and 5/5 shoulder shrug bilaterally    CN XII midline tongue protrusion    Motor:  Muscle bulk: normal, tone normal, pronator drift none tremor yes, mild intention tremor Mvmt Root Nerve  Muscle Right Left Comments  SA C5/6 Ax Deltoid 5 5   EF C5/6 Mc Biceps 5 5   EE C6/7/8 Rad Triceps 5 5   WF C6/7 Med FCR 5 5   WE C7/8 PIN ECU 5 5   F Ab C8/T1 U ADM/FDI 5 5  HF L1/2/3 Fem Illopsoas 4+ 4+ Gets cramps and did not want to put in her full strength  KE L2/3/4 Fem Quad 5 5   DF L4/5 D Peron Tib Ant 5 5   PF S1/2 Tibial Grc/Sol 5 5    Reflexes:  Right Left Comments  Pectoralis      Biceps (C5/6) 2 2   Brachioradialis (C5/6) 2 2    Triceps (C6/7) 2 2    Patellar (L3/4) 3 3    Achilles (S1) 1 1    Hoffman      Plantar mute mute   Jaw jerk    Sensation:  Light touch Intact throughout   Pin  prick    Temperature    Vibration   Proprioception    Coordination/Complex Motor:  - Finger to Nose intact BL - Heel to shin intact BL - Rapid alternating movement are normal - Gait: Deferred.  Labs   CBC:  Recent Labs  Lab 05/01/20 1740  WBC 7.5  NEUTROABS 6.2  HGB 14.8  HCT 44.5  MCV 95.3  PLT 696    Basic Metabolic Panel:  Lab Results  Component Value Date   NA 140 05/02/2020   K 4.7 05/02/2020   CO2 25 05/02/2020   GLUCOSE 101 (H) 05/02/2020   BUN 16 05/02/2020   CREATININE 0.90 05/02/2020   CALCIUM 9.4 05/02/2020   GFRNONAA >60 05/02/2020   GFRAA >60 11/27/2015   Lipid Panel:  Lab Results  Component Value Date   LDLCALC 156 (H) 05/02/2020   HgbA1c:  Lab Results  Component Value Date   HGBA1C 5.7 (H) 05/02/2020   Urine Drug Screen: No results found for: LABOPIA, [place], LABBENZ, [place], THCU, LABBARB  Alcohol Level     Component Value Date/Time   ETH <10 05/01/2020 1740    CT Head without contrast: Personally reviewed and CTH was negative for a large hypodensity concerning for a large territory infarct or hyperdensity concerning for an ICH  CT angio Head and Neck with contrast: - No LVO  MRI Brain  No acute stroke  rEEG:  This study is within normal limits. No seizures or epileptiform discharges were seen throughout the recording.  Impression   Kim Mclaughlin is a 75 y.o. female presenting with an episode that is consistent with transient global amnesia.  The episode resolved and she has no residual or any focal deficit.  Work-up with MRI brain negative for a stroke.  LDL is elevated 156 and hemoglobin A1c of 5.7.  Recommendations  - I ordered CT Angio head and neck - Recommend obtaining TTE outpatient within 1 week of discharge - continue Aspirin 81mg  daily - Defer management of Prediabetes to her PCP - I started high intensity statin - atorvastatin 80mg  daily for LDL of 156 (discused potential myalgias and hepatic  dysfunction as potential side effects) - Stroke education booklet - Follow up with PCP within 1-2 weeks - PCP should keep an eye on the Echocardiogram. - Follow up with stroke clinic with Dr. Leonie Man with [facility] within 3 months. - routine Neurosurgery follow up for noted aneurysms on CT angio head. ______________________________________________________________________   Thank you for the opportunity to take part in the care of this patient. If you have any further questions, please contact the neurology consultation attending.  Signed,  [facility] Pager Number [phone] _ _ _   _ __   _ __ _ _  __ __   _ __  __ _  

## 2020-05-02 NOTE — ED Notes (Addendum)
VSS, family at bedside

## 2020-05-02 NOTE — Assessment & Plan Note (Signed)
-   per CTA head/neck: "Small outpouching (2-3 mm) from the undersurface of the bilateral supraclinoid ICA may represent an aneurysm versus infundibulum" -Outpatient follow-up with neurosurgery for surveillance

## 2020-05-02 NOTE — Assessment & Plan Note (Signed)
-  Presentation considered consistent with TGA. Stroke work-up negative -EEG also negative -Evaluated by neurology during hospitalization as well -Recommended to have follow-up with neurology within 3 months and echo within 1 week -Continue aspirin 81 mg daily -Continue further risk factor modification control

## 2020-05-02 NOTE — Progress Notes (Signed)
EEG Completed; Results Pending  

## 2020-05-02 NOTE — ED Notes (Signed)
Pt in bed resting, vss, family at the bedside

## 2020-05-02 NOTE — Discharge Summary (Signed)
Physician Discharge Summary   Kim Mclaughlin DOB: 11/20/1945 DOA: 05/01/2020  PCP: Donato Schultz, DO  Admit date: 05/01/2020 Discharge date: 05/02/2020  Admitted From: home Disposition:  home Admitting physician: Dr. Darreld Mclean Discharging physician: Lewie Chamber, MD  Recommendations for Outpatient Follow-up:  1. Follow up with Dr. Pearlean Brownie within 3 months 2. Echo ordered at discharge to be performed within 1 week 3. May need referral to neurosurgery for ongoing aneurysm monitoring 4. Follow-up tolerance of Lipitor initiation   Patient discharged to home in Discharge Condition: stable CODE STATUS: Full Diet recommendation:  Diet Orders (From admission, onward)    Start     Ordered   05/02/20 0000  Diet - low sodium heart healthy        05/02/20 1717   05/01/20 2045  Diet Heart Room service appropriate? Yes; Fluid consistency: Thin  Diet effective now       Question Answer Comment  Room service appropriate? Yes   Fluid consistency: Thin      05/01/20 2044          Hospital Course: Kim Mclaughlin is a 75 y.o. female with medical history significant for hyperlipidemia who presented to the ED for evaluation of acute memory loss.  History is limited from patient due to memory difficulty and is otherwise supplemented by EDP, chart review, and husband at bedside.   Patient was in her usual state of health morning of 05/01/2020.  She left [place] around 9:45 AM [place].  Husband states that she appeared to be her normal self at that time.  Around 2 PM patient got a phone call from her sister and was noted to have a significant change in mental status.  She appeared confused.  Her husband found her wandering at the shopping center and brought her to the ED for further evaluation.   Patient states that she remembers [place] but does not recall anything after that.  She was noted to  have repetitive questioning per husband.  She reports having a mild headache but otherwise denies any subjective fevers, chills, diaphoresis, nausea, vomiting, change in vision, chest pain, palpitations, dyspnea, cough, abdominal pain, dysuria, diarrhea, numbness/tingling, weakness of her extremities.  She did not have any dysarthria or apparent facial droop.   Patient does not currently take any prescription medications.  She denies any recent tobacco, alcohol, or illicit drug use.  Lab work-up was unremarkable.   CT head without contrast was negative for evidence of acute intracranial abnormality.  Stable mild cerebral atrophy is noted.  MRI brain was also negative for acute stroke or acute abnormalities. CTA head/neck also obtained which was negative for acute abnormalities. There was no LVO or hemodynamically significant stenosis in the head or neck. There was noted outpouching (2 to 3 mm) from the undersurface of the bilateral supraclinoid ICA concerning for possible aneurysm versus infundibulum. She is recommended to have outpatient monitoring and follow-up with neurology and possibly neurosurgery referral for ongoing surveillance.  An echo was also ordered at discharge and recommended to be performed within 1 week per neurology. She is also started on Lipitor and was counseled on possible myalgias given history of statin allergy. She is on aspirin 81 mg daily at home and was instructed to continue on this as well.   * Transient global amnesia -Presentation considered consistent with TGA. Stroke work-up negative -EEG also negative -Evaluated by neurology  during hospitalization as well -Recommended to have follow-up with neurology within 3 months and echo within 1 week -Continue aspirin 81 mg daily -Continue further risk factor modification control  Aneurysm ([place]) - per CTA head/neck: "Small outpouching (2-3 mm) from the undersurface of the bilateral supraclinoid ICA may represent an  aneurysm versus infundibulum" -Outpatient follow-up with neurosurgery for surveillance  Hyperlipidemia -Started on Lipitor 80 mg daily. -Outpatient follow-up for repeat LFTs and monitoring for development of myalgias and tolerance    The patient's chronic medical conditions were treated accordingly per the patient's home medication regimen except as noted.  On day of discharge, patient was felt deemed stable for discharge. Patient/family member advised to call PCP or come back to ER if needed.   Principal Diagnosis: Transient global amnesia  Discharge Diagnoses: Active Hospital Problems   Diagnosis Date Noted  . Transient global amnesia 05/01/2020    Priority: High  . Aneurysm ([employer]) 05/02/2020    Priority: Medium  . Hyperlipidemia 07/25/2007    Resolved Hospital Problems  No resolved problems to display.    Discharge Instructions    Diet - low sodium heart healthy   Complete by: As directed    Increase activity slowly   Complete by: As directed      Allergies as of 05/02/2020      Reactions   Macrobid  [nitrofurantoin Macrocrystal] Other (See Comments)   Doxycycline Hives   Pravastatin Sodium    REACTION: shin pain   Amoxicillin-pot Clavulanate    GI symptoms; abdominal pain & nausea NOTE: able to take Ampicillin   Augmentin [amoxicillin-pot Clavulanate] Nausea Only   Upsets stomach   Codeine Nausea Only   Methylphenidate Hcl Palpitations   Methylprednisolone Rash   Rash and redness on face   Sulfamethoxazole-trimethoprim Nausea Only    nausea      Medication List    STOP taking these medications   meclizine 25 MG tablet Commonly known as: ANTIVERT   traMADol 50 MG tablet Commonly known as: ULTRAM     TAKE these medications   aspirin EC 81 MG tablet Take 81 mg by mouth daily.   atorvastatin 80 MG tablet Commonly known as: Lipitor Take 1 tablet (80 mg total) by mouth daily.   CENTRUM SILVER PO Take 1 tablet by mouth daily.   cetirizine 10 MG  tablet Commonly known as: ZYRTEC Take 10 mg by mouth daily as needed for allergies.   cholecalciferol 1000 units tablet Commonly known as: VITAMIN D Take 1,000 Units by mouth daily.   ELDERBERRY PO Take 2 lozenges by mouth daily.   Fish Oil 1000 MG Caps Take 1 capsule by mouth daily. What changed: Another medication with the same name was removed. Continue taking this medication, and follow the directions you see here.   lidocaine 5 % Commonly known as: Lidoderm Place 1 patch onto the skin daily. Remove & Discard patch within 12 hours or as directed by MD What changed: additional instructions   MSM GLUCOSAMINE PO Take 2 tablets by mouth daily.   OVER THE COUNTER MEDICATION Take 1 tablet by mouth 3 (three) times daily. Bone strength   triamcinolone 0.1 % Commonly known as: KENALOG Apply 1 application topically 2 (two) times daily as needed (eczema.).   triamcinolone 55 MCG/ACT Aero nasal inhaler Commonly known as: NASACORT Place 2 sprays into the nose daily as needed (allergies).   vitamin C 1000 MG tablet Take 1,000 mg by mouth daily.   zinc gluconate 50 MG tablet Take  50 mg by mouth daily.       Follow-up Information    Ann Held, DO. Schedule an appointment as soon as possible for a visit in 1 week(s).   Specialty: Family Medicine Contact information: [address] [place] [address] [phone]        [place]. Call in 1 week(s).   Why: If you are not called regarding the heart echo, please call before 1 week to schedule Contact information: [place] [address] [phone]       Garvin Fila, MD. Schedule an appointment as soon as possible for a visit in 3 month(s).   Specialties: Neurology, Radiology Contact information: [address] [place] [address] [phone]              Allergies   Allergen Reactions  . Macrobid  [Nitrofurantoin Macrocrystal] Other (See Comments)  . Doxycycline Hives  . Pravastatin Sodium     REACTION: shin pain  . Amoxicillin-Pot Clavulanate     GI symptoms; abdominal pain & nausea NOTE: able to take Ampicillin  . Augmentin [Amoxicillin-Pot Clavulanate] Nausea Only    Upsets stomach  . Codeine Nausea Only  . Methylphenidate Hcl Palpitations  . Methylprednisolone Rash    Rash and redness on face  . Sulfamethoxazole-Trimethoprim Nausea Only     nausea    Consultations: Neurology  Discharge Exam: BP (!) 124/58   Pulse 80   Temp 98.2 F (36.8 C) (Oral)   Resp 16   SpO2 96%  General appearance: alert, cooperative and no distress Head: Normocephalic, without obvious abnormality, atraumatic Eyes: EOMI, PERRL Lungs: clear to auscultation bilaterally Heart: regular rate and rhythm and S1, S2 normal Abdomen: normal findings: bowel sounds normal and soft, non-tender Extremities: No edema Skin: mobility and turgor normal Neurologic: 5/5 strength throughout. Sensation intact throughout. No finger-to-nose dysmetria  The results of significant diagnostics from this hospitalization (including imaging, microbiology, ancillary and laboratory) are listed below for reference.   Microbiology: Recent Results (from the past 240 hour(s))  SARS CORONAVIRUS 2 (TAT 6-24 HRS) Nasopharyngeal Nasopharyngeal Swab     Status: None   Collection Time: 05/01/20  8:10 PM   Specimen: Nasopharyngeal Swab  Result Value Ref Range Status   SARS Coronavirus 2 NEGATIVE NEGATIVE Final    Comment: (NOTE) SARS-CoV-2 target nucleic acids are NOT DETECTED.  The SARS-CoV-2 RNA is generally detectable in upper and lower respiratory specimens during the acute phase of infection. Negative results do not preclude SARS-CoV-2 infection, do not rule out co-infections with other pathogens, and should not be used as the sole basis for treatment or other patient management  decisions. Negative results must be combined with clinical observations, patient history, and epidemiological information. The expected result is Negative.  Fact Sheet for Patients: SugarRoll.be  Fact Sheet for Healthcare Providers: https://www.woods-mathews.com/  This test is not yet approved or cleared by the [place] FDA and  has been authorized for detection and/or diagnosis of SARS-CoV-2 by FDA under an Emergency Use Authorization (EUA). This EUA will remain  in effect (meaning this test can be used) for the duration of the COVID-19 declaration under Se ction 564(b)(1) of the Act, 21 U.S.C. section 360bbb-3(b)(1), unless the authorization is terminated or revoked sooner.  Performed at [facility], [place] [address] [place], [place] [address]      Labs: BNP (last 3 results) No results for input(s):  BNP in the last 8760 hours. Basic Metabolic Panel: Recent Labs  Lab 05/01/20 1740 05/02/20 0552  NA 140 140  K 4.3 4.7  CL 104 104  CO2 23 25  GLUCOSE 131* 101*  BUN 17 16  CREATININE 0.93 0.90  CALCIUM 10.5* 9.4   Liver Function Tests: No results for input(s): AST, ALT, ALKPHOS, BILITOT, PROT, ALBUMIN in the last 168 hours. No results for input(s): LIPASE, AMYLASE in the last 168 hours. Recent Labs  Lab 05/01/20 2005  AMMONIA 25   CBC: Recent Labs  Lab 05/01/20 1740  WBC 7.5  NEUTROABS 6.2  HGB 14.8  HCT 44.5  MCV 95.3  PLT 361   Cardiac Enzymes: No results for input(s): CKTOTAL, CKMB, CKMBINDEX, TROPONINI in the last 168 hours. BNP: Invalid input(s): POCBNP CBG: Recent Labs  Lab 05/01/20 1723  GLUCAP 113*   D-Dimer No results for input(s): DDIMER in the last 72 hours. Hgb A1c Recent Labs    05/02/20 0552  HGBA1C 5.7*   Lipid Profile Recent Labs    05/02/20 0552  CHOL 250*  HDL 85  LDLCALC 156*  TRIG 44  CHOLHDL 2.9   Thyroid function studies Recent Labs    05/01/20 2005  TSH  1.302   Anemia work up Recent Labs    05/01/20 2005  VITAMINB12 657  FOLATE 79.4   Urinalysis    Component Value Date/Time   COLORURINE STRAW (A) 05/01/2020 2005   APPEARANCEUR CLEAR 05/01/2020 2005   LABSPEC 1.008 05/01/2020 2005   PHURINE 8.0 05/01/2020 2005   GLUCOSEU NEGATIVE 05/01/2020 2005   GLUCOSEU NEGATIVE 06/30/2015 0932   HGBUR NEGATIVE 05/01/2020 2005   BILIRUBINUR NEGATIVE 05/01/2020 2005   BILIRUBINUR neg 08/09/2016 0844   KETONESUR 5 (A) 05/01/2020 2005   PROTEINUR NEGATIVE 05/01/2020 2005   UROBILINOGEN 0.2 08/09/2016 0844   UROBILINOGEN 0.2 06/30/2015 0932   NITRITE NEGATIVE 05/01/2020 2005   LEUKOCYTESUR TRACE (A) 05/01/2020 2005   Sepsis Labs Invalid input(s): PROCALCITONIN,  WBC,  LACTICIDVEN Microbiology Recent Results (from the past 240 hour(s))  SARS CORONAVIRUS 2 (TAT 6-24 HRS) Nasopharyngeal Nasopharyngeal Swab     Status: None   Collection Time: 05/01/20  8:10 PM   Specimen: Nasopharyngeal Swab  Result Value Ref Range Status   SARS Coronavirus 2 NEGATIVE NEGATIVE Final    Comment: (NOTE) SARS-CoV-2 target nucleic acids are NOT DETECTED.  The SARS-CoV-2 RNA is generally detectable in upper and lower respiratory specimens during the acute phase of infection. Negative results do not preclude SARS-CoV-2 infection, do not rule out co-infections with other pathogens, and should not be used as the sole basis for treatment or other patient management decisions. Negative results must be combined with clinical observations, patient history, and epidemiological information. The expected result is Negative.  Fact Sheet for Patients: SugarRoll.be  Fact Sheet for Healthcare Providers: https://www.woods-mathews.com/  This test is not yet approved or cleared by the [place] FDA and  has been authorized for detection and/or diagnosis of SARS-CoV-2 by FDA under an Emergency Use Authorization (EUA). This EUA  will remain  in effect (meaning this test can be used) for the duration of the COVID-19 declaration under Se ction 564(b)(1) of the Act, 21 U.S.C. section 360bbb-3(b)(1), unless the authorization is terminated or revoked sooner.  Performed at [facility], [place] [address], [place], [place] [address]     Procedures/Studies: EEG  Result Date: 05/02/2020 Lora Havens, MD     05/02/2020 12:50 PM Patient Name: Teriana  JELISSA DANZIG MRN: [id number] Epilepsy Attending: Lora Havens Referring Physician/Provider: Dr. Zada Finders Date: 05/02/2020 Duration: 24.46 mins Patient history: 75 year old female with transient global amnesia.  EEG to evaluate for seizures. Level of alertness: Awake, asleep AEDs during EEG study: None Technical aspects: This EEG study was done with scalp electrodes positioned according to the 10-20 International system of electrode placement. Electrical activity was acquired at a sampling rate of 500Hz  and reviewed with a high frequency filter of 70Hz  and a low frequency filter of 1Hz . EEG data were recorded continuously and digitally stored. Description: The posterior dominant rhythm consists of 9-10 Hz activity of moderate voltage (25-35 uV) seen predominantly in posterior head regions, symmetric and reactive to eye opening and eye closing. Sleep was characterized by vertex waves, sleep spindles (12 to 14 Hz), maximal frontocentral region.  Hyperventilation and photic stimulation were not performed.   IMPRESSION: This study is within normal limits. No seizures or epileptiform discharges were seen throughout the recording. Priyanka [name]   CT ANGIO HEAD W OR WO CONTRAST  Result Date: 05/02/2020 CLINICAL DATA:  Neuro deficit, acute, stroke suspected. EXAM: CT ANGIOGRAPHY HEAD AND NECK TECHNIQUE: Multidetector CT imaging of the head and neck was performed using the standard protocol during bolus administration of intravenous contrast. Multiplanar CT image reconstructions and MIPs  were obtained to evaluate the vascular anatomy. Carotid stenosis measurements (when applicable) are obtained utilizing NASCET criteria, using the distal internal carotid diameter as the denominator. CONTRAST:  149mL OMNIPAQUE IOHEXOL 350 MG/ML SOLN COMPARISON:  Head CT and head MRI May 01, 2020. FINDINGS: CT HEAD FINDINGS Brain: No evidence of acute infarction, hemorrhage, hydrocephalus, extra-axial collection or mass lesion/mass effect. Vascular: No hyperdense vessel or unexpected calcification. Skull: Normal. Negative for fracture or focal lesion. Sinuses: Imaged portions are clear. Orbits: No acute finding. Review of the MIP images confirms the above findings CTA NECK FINDINGS Aortic arch: Standard branching. Imaged portion shows no evidence of aneurysm or dissection. Calcified atherosclerotic plaques are seen. No significant stenosis of the major arch vessel origins. Right carotid system: Mild atherosclerotic changes of the right carotid bifurcation. No evidence of dissection, stenosis (50% or greater) or occlusion. Left carotid system: Mild atherosclerotic changes of the left carotid bifurcation. No evidence of dissection, stenosis (50% or greater) or occlusion. Vertebral arteries: No evidence of dissection, stenosis (50% or greater) or occlusion. Skeleton: Degenerative changes of the cervical spine and bilateral temporomandibular joints. No acute findings. Other neck: Negative. Upper chest: Negative. Review of the MIP images confirms the above findings CTA HEAD FINDINGS Anterior circulation: A 3 mm outpouching from the undersurface of the supraclinoid right ICA may represent an aneurysm versus infundibulum. A 2-3 mm outpouching from the undersurface of the supraclinoid left ICA may also represent an aneurysm versus an infundibulum. No significant stenosis, proximal occlusion, aneurysm, or vascular malformation. Posterior circulation: No significant stenosis, proximal occlusion, aneurysm, or vascular  malformation. Venous sinuses: As permitted by contrast timing, patent. Anatomic variants: None. Review of the MIP images confirms the above findings IMPRESSION: 1. No acute intracranial abnormality. 2. No large vessel occlusion or hemodynamically significant stenosis of the head or neck. 3. Small outpouching (2-3 mm) from the undersurface of the bilateral supraclinoid ICA may represent an aneurysm versus infundibulum. Electronically Signed   By: Pedro Earls M.D.   On: 05/02/2020 16:41   CT Head Wo Contrast  Result Date: 05/01/2020 CLINICAL DATA:  Neuro deficit, acute, stroke suspected. Additional history provided: Confusion, dizziness. EXAM: CT HEAD  WITHOUT CONTRAST TECHNIQUE: Contiguous axial images were obtained from the base of the skull through the vertex without intravenous contrast. COMPARISON:  Prior head CT 11/16/2018. FINDINGS: Brain: Mild cerebral atrophy. There is no acute intracranial hemorrhage. No demarcated cortical infarct. No extra-axial fluid collection. No evidence of intracranial mass. No midline shift. Partially empty sella turcica. Vascular: No hyperdense vessel.  Atherosclerotic calcifications. Skull: Normal. Negative for fracture or focal lesion. Sinuses/Orbits: Visualized orbits show no acute finding. No significant paranasal sinus disease at the imaged levels. IMPRESSION: No evidence of acute intracranial abnormality. Stable, mild cerebral atrophy. Electronically Signed   By: Kellie Simmering DO   On: 05/01/2020 18:51   CT ANGIO NECK W OR WO CONTRAST  Result Date: 05/02/2020 CLINICAL DATA:  Neuro deficit, acute, stroke suspected. EXAM: CT ANGIOGRAPHY HEAD AND NECK TECHNIQUE: Multidetector CT imaging of the head and neck was performed using the standard protocol during bolus administration of intravenous contrast. Multiplanar CT image reconstructions and MIPs were obtained to evaluate the vascular anatomy. Carotid stenosis measurements (when applicable) are obtained  utilizing NASCET criteria, using the distal internal carotid diameter as the denominator. CONTRAST:  179mL OMNIPAQUE IOHEXOL 350 MG/ML SOLN COMPARISON:  Head CT and head MRI May 01, 2020. FINDINGS: CT HEAD FINDINGS Brain: No evidence of acute infarction, hemorrhage, hydrocephalus, extra-axial collection or mass lesion/mass effect. Vascular: No hyperdense vessel or unexpected calcification. Skull: Normal. Negative for fracture or focal lesion. Sinuses: Imaged portions are clear. Orbits: No acute finding. Review of the MIP images confirms the above findings CTA NECK FINDINGS Aortic arch: Standard branching. Imaged portion shows no evidence of aneurysm or dissection. Calcified atherosclerotic plaques are seen. No significant stenosis of the major arch vessel origins. Right carotid system: Mild atherosclerotic changes of the right carotid bifurcation. No evidence of dissection, stenosis (50% or greater) or occlusion. Left carotid system: Mild atherosclerotic changes of the left carotid bifurcation. No evidence of dissection, stenosis (50% or greater) or occlusion. Vertebral arteries: No evidence of dissection, stenosis (50% or greater) or occlusion. Skeleton: Degenerative changes of the cervical spine and bilateral temporomandibular joints. No acute findings. Other neck: Negative. Upper chest: Negative. Review of the MIP images confirms the above findings CTA HEAD FINDINGS Anterior circulation: A 3 mm outpouching from the undersurface of the supraclinoid right ICA may represent an aneurysm versus infundibulum. A 2-3 mm outpouching from the undersurface of the supraclinoid left ICA may also represent an aneurysm versus an infundibulum. No significant stenosis, proximal occlusion, aneurysm, or vascular malformation. Posterior circulation: No significant stenosis, proximal occlusion, aneurysm, or vascular malformation. Venous sinuses: As permitted by contrast timing, patent. Anatomic variants: None. Review of the MIP  images confirms the above findings IMPRESSION: 1. No acute intracranial abnormality. 2. No large vessel occlusion or hemodynamically significant stenosis of the head or neck. 3. Small outpouching (2-3 mm) from the undersurface of the bilateral supraclinoid ICA may represent an aneurysm versus infundibulum. Electronically Signed   By: Pedro Earls M.D.   On: 05/02/2020 16:41   MR BRAIN WO CONTRAST  Result Date: 05/01/2020 CLINICAL DATA:  Initial evaluation for memory loss.  Confusion. EXAM: MRI HEAD WITHOUT CONTRAST TECHNIQUE: Multiplanar, multiecho pulse sequences of the brain and surrounding structures were obtained without intravenous contrast. COMPARISON:  Prior head CT from earlier the same day. FINDINGS: Brain: Cerebral volume within normal limits for age. Minimal patchy T2/FLAIR hyperintensity within the periventricular white matter, most likely related chronic microvascular ischemic disease, felt to be within normal limits for age. No  abnormal foci of restricted diffusion to suggest acute or subacute ischemia. Gray-white matter differentiation maintained. No encephalomalacia to suggest chronic cortical infarction. No acute intracranial hemorrhage. Single chronic microhemorrhage noted at the right external capsule, of doubtful significance in isolation. No mass lesion, midline shift or mass effect. No hydrocephalus or extra-axial fluid collection. Pituitary gland and suprasellar region within normal limits. Midline structures intact. Vascular: Major intracranial vascular flow voids are maintained. Skull and upper cervical spine: Craniocervical junction within normal limits. Bone marrow signal intensity normal. No scalp soft tissue abnormality. Sinuses/Orbits: Globes and orbital soft tissues within normal limits. Mild mucosal thickening noted within the ethmoidal air cells. Paranasal sinuses are otherwise clear. No mastoid effusion. Inner ear structures grossly normal. Other: None.  IMPRESSION: Normal brain MRI for age. No acute intracranial abnormality identified. Electronically Signed   By: Jeannine Boga M.D.   On: 05/01/2020 21:45     Time coordinating discharge: Over 30 minutes    Dwyane Dee, MD  [facility] 05/02/2020, 5:39 PM

## 2020-05-06 ENCOUNTER — Observation Stay (HOSPITAL_BASED_OUTPATIENT_CLINIC_OR_DEPARTMENT_OTHER)
Admission: RE | Admit: 2020-05-06 | Discharge: 2020-05-06 | Disposition: A | Discharge status: Home / Self Care | Payer: Medicare PPO | Source: Ambulatory Visit | Attending: Internal Medicine | Admitting: Internal Medicine

## 2020-05-06 ENCOUNTER — Other Ambulatory Visit: Payer: Self-pay

## 2020-05-06 DIAGNOSIS — I361 Nonrheumatic tricuspid (valve) insufficiency: Secondary | ICD-10-CM | POA: Diagnosis not present

## 2020-05-06 DIAGNOSIS — G454 Transient global amnesia: Secondary | ICD-10-CM | POA: Diagnosis not present

## 2020-05-06 NOTE — Progress Notes (Signed)
  Echocardiogram 2D Echocardiogram has been performed.  Kim Mclaughlin G Kim Mclaughlin 05/06/2020, 11:49 AM

## 2020-05-07 LAB — ECHOCARDIOGRAM COMPLETE
Area-P 1/2: 3.26 cm2
P 1/2 time: 667 msec
S' Lateral: 3.2 cm

## 2020-05-08 ENCOUNTER — Other Ambulatory Visit: Payer: Self-pay

## 2020-05-08 ENCOUNTER — Ambulatory Visit (INDEPENDENT_AMBULATORY_CARE_PROVIDER_SITE_OTHER): Payer: Medicare PPO | Admitting: Family Medicine

## 2020-05-08 ENCOUNTER — Encounter: Payer: Self-pay | Admitting: Family Medicine

## 2020-05-08 VITALS — BP 120/80 | Temp 97.8°F | Resp 18 | Ht 63.0 in | Wt 141.4 lb

## 2020-05-08 DIAGNOSIS — I671 Cerebral aneurysm, nonruptured: Secondary | ICD-10-CM

## 2020-05-08 DIAGNOSIS — G454 Transient global amnesia: Secondary | ICD-10-CM

## 2020-05-08 NOTE — Assessment & Plan Note (Signed)
Pt to f/u neuro con't aspirin  Echo done Mon as ordered by er ---- normal

## 2020-05-08 NOTE — Progress Notes (Signed)
Patient ID: Kim Mclaughlin, female    DOB: 04/13/1946  Age: 75 y.o. MRN: [id number]    Subjective:  Subjective  HPI Kim Mclaughlin presents for f/u hosp for transient global   Pt husband is with her.  She went to get her hair done on 1/6 and then went to [employer] --- her sister called her and pt was not acting right and she could not find her car.  Her sister told her to stay where she was and she called her husband who went and got her and took her to er.   Ct head, cta and mri done --- small aneurysm was seen but otherwise normal.  Pt was told to take aspirin and f/u with neuro / and neurosurgery.    The pt does not remember the incident but has not had any more episodes since.  No headaches, no cp , no sob etc.   Review of Systems  Constitutional: Negative for appetite change, diaphoresis, fatigue and unexpected weight change.  Eyes: Negative for pain, redness and visual disturbance.  Respiratory: Negative for cough, chest tightness, shortness of breath and wheezing.   Cardiovascular: Negative for chest pain, palpitations and leg swelling.  Endocrine: Negative for cold intolerance, heat intolerance, polydipsia, polyphagia and polyuria.  Genitourinary: Negative for difficulty urinating, dysuria and frequency.  Neurological: Negative for dizziness, light-headedness, numbness and headaches.  Psychiatric/Behavioral: Negative for behavioral problems, confusion, decreased concentration, dysphoric mood, hallucinations, self-injury, sleep disturbance and suicidal ideas. The patient is not nervous/anxious and is not hyperactive.     History Past Medical History:  Diagnosis Date  . Allergy   . Arthritis 07/2014   Right hand, prescribed prednisone-no longer taking. also Rt knww  . DVT (deep venous thrombosis) ([place]) 2015   very small; treated with compression and heat, no other problems since  . GERD (gastroesophageal reflux disease)    patient denies states she does have hx  of peptic ulcer many years  . Hepatitis    in 10 th grade  (not sure type- not hep C) - treated  no other problems  . Hyperlipidemia    diet controlled  . MVP (mitral valve prolapse)    no problems, MD told patient she could not hear it  . Osteopenia    [facility]  . PONV (postoperative nausea and vomiting)    with ear surgery only - No other problems with other surgeries  . Precancerous lesion 12/2014   Treated with cream only  . Varicose veins     She has a past surgical history that includes Appendectomy; Tubal ligation; Stapedectomy; G 3 P 3; Dilation and curettage of uterus; colonoscopy with polypectomy (07/2012); Endovenous ablation saphenous vein w/ laser (Left, 01-31-2014); Endovenous ablation saphenous vein w/ laser (Right, 03-14-2014); Dilation and curettage of uterus (N/A, 09/03/2014); Hysteroscopy with D & C (N/A, 03/25/2016); Foot arthrodesis (Left, 04/22/2016); Bunionectomy with hammertoe reconstruction (Left, 04/22/2016); Tarsal metatarsal fusion with weil osteotomy (Left, 04/22/2016); Colonoscopy; and left ear surgery.   Her family history includes Aneurysm in her paternal uncle; Breast cancer in her paternal aunt; Cancer in an other family member; Colon cancer in her father and paternal grandmother; Heart attack in her father and paternal grandfather; Miscarriages / [place] in her mother; Ovarian cancer in her maternal aunt; Pancreatic cancer in her mother; Stroke in her paternal grandmother; Thyroid disease in her mother.She reports that she quit smoking about 57 years ago. Her smoking use included cigarettes. She has never used smokeless tobacco. She  reports that she does not drink alcohol and does not use drugs.  Current Outpatient Medications on File Prior to Visit  Medication Sig Dispense Refill  . Ascorbic Acid (VITAMIN C) 1000 MG tablet Take 1,000 mg by mouth daily.    Marland Kitchen aspirin EC 81 MG tablet Take 81 mg by mouth daily.    Marland Kitchen atorvastatin (LIPITOR) 80 MG tablet Take 1  tablet (80 mg total) by mouth daily. 30 tablet 3  . cetirizine (ZYRTEC) 10 MG tablet Take 10 mg by mouth daily as needed for allergies.     . cholecalciferol (VITAMIN D) 1000 units tablet Take 1,000 Units by mouth daily.    Marland Kitchen ELDERBERRY PO Take 2 lozenges by mouth daily.     . Glucosamine HCl-MSM (MSM GLUCOSAMINE PO) Take 2 tablets by mouth daily.     Marland Kitchen lidocaine (LIDODERM) 5 % Place 1 patch onto the skin daily. Remove & Discard patch within 12 hours or as directed by MD (Patient taking differently: Place 1 patch onto the skin daily.) 30 patch 0  . Multiple Vitamins-Minerals (CENTRUM SILVER PO) Take 1 tablet by mouth daily.    . Omega-3 Fatty Acids (FISH OIL) 1000 MG CAPS Take 1 capsule by mouth daily.     Marland Kitchen OVER THE COUNTER MEDICATION Take 1 tablet by mouth 3 (three) times daily. Bone strength    . triamcinolone (NASACORT) 55 MCG/ACT AERO nasal inhaler Place 2 sprays into the nose daily as needed (allergies).     . triamcinolone cream (KENALOG) 0.1 % Apply 1 application topically 2 (two) times daily as needed (eczema.).     Marland Kitchen zinc gluconate 50 MG tablet Take 50 mg by mouth daily.     No current facility-administered medications on file prior to visit.     Objective:  Objective  Physical Exam Vitals and nursing note reviewed.  Constitutional:      Appearance: She is well-developed and well-nourished.  HENT:     Head: Normocephalic and atraumatic.  Eyes:     Extraocular Movements: EOM normal.     Conjunctiva/sclera: Conjunctivae normal.  Neck:     Thyroid: No thyromegaly.     Vascular: No carotid bruit or JVD.  Cardiovascular:     Rate and Rhythm: Normal rate and regular rhythm.     Heart sounds: Normal heart sounds. No murmur heard.   Pulmonary:     Effort: Pulmonary effort is normal. No respiratory distress.     Breath sounds: Normal breath sounds. No wheezing or rales.  Chest:     Chest wall: No tenderness.  Musculoskeletal:        General: No edema.     Cervical back:  Normal range of motion and neck supple.  Neurological:     General: No focal deficit present.     Mental Status: She is alert and oriented to person, place, and time. Mental status is at baseline.     Cranial Nerves: No cranial nerve deficit.     Motor: No weakness.     Coordination: Coordination normal.     Gait: Gait normal.     Deep Tendon Reflexes: Reflexes normal.  Psychiatric:        Mood and Affect: Mood and affect and mood normal.        Behavior: Behavior normal.        Thought Content: Thought content normal.        Judgment: Judgment normal.    BP 120/80 (BP Location: Right Arm, Patient Position:  Sitting, Cuff Size: Normal)   Temp 97.8 F (36.6 C) (Oral)   Resp 18   Ht 5\' 3"  (1.6 m)   Wt 141 lb 6.4 oz (64.1 kg)   BMI 25.05 kg/m  Wt Readings from Last 3 Encounters:  05/08/20 141 lb 6.4 oz (64.1 kg)  12/28/19 142 lb (64.4 kg)  10/18/19 141 lb (64 kg)     Lab Results  Component Value Date   WBC 7.5 05/01/2020   HGB 14.8 05/01/2020   HCT 44.5 05/01/2020   PLT 361 05/01/2020   GLUCOSE 101 (H) 05/02/2020   CHOL 250 (H) 05/02/2020   TRIG 44 05/02/2020   HDL 85 05/02/2020   LDLDIRECT 188.0 08/08/2012   LDLCALC 156 (H) 05/02/2020   ALT 16 11/05/2019   AST 16 11/05/2019   NA 140 05/02/2020   K 4.7 05/02/2020   CL 104 05/02/2020   CREATININE 0.90 05/02/2020   BUN 16 05/02/2020   CO2 25 05/02/2020   TSH 1.302 05/01/2020   INR 1.0 05/01/2020   HGBA1C 5.7 (H) 05/02/2020    ECHOCARDIOGRAM COMPLETE  Result Date: 05/07/2020    ECHOCARDIOGRAM REPORT   Patient Name:   Kim Mclaughlin Date of Exam: 05/06/2020 Medical Rec #:  [id number]          Height:       63.0 in Accession #:    [id number]         Weight:       142.0 lb Date of Birth:  [birth year]          BSA:          1.672 m Patient Age:    36 years           BP:           126/76 mmHg Patient Gender: F                  HR:           67 bpm. Exam Location:  Outpatient Procedure: 2D Echo, 3D Echo, Cardiac Doppler  and Color Doppler Indications:    Transient Global Amnesia [id number]  History:        Patient has no prior history of Echocardiogram examinations.                 Mitral Valve Prolapse; Risk Factors:Dyslipidemia and GERD.  Sonographer:    Jonelle Sidle Dance Referring Phys: [place]  1. Left ventricular ejection fraction, by estimation, is 55 to 60%. The left ventricle has normal function. The left ventricle has no regional wall motion abnormalities. Left ventricular diastolic parameters are indeterminate.  2. Right ventricular systolic function is normal. The right ventricular size is normal. There is normal pulmonary artery systolic pressure.  3. The mitral valve is normal in structure. Trivial mitral valve regurgitation. No evidence of mitral stenosis.  4. The aortic valve is tricuspid. There is mild calcification of the aortic valve. Aortic valve regurgitation is trivial. Mild aortic valve sclerosis is present, with no evidence of aortic valve stenosis.  5. The inferior vena cava is normal in size with greater than 50% respiratory variability, suggesting right atrial pressure of 3 mmHg. Comparison(s): No prior Echocardiogram. Conclusion(s)/Recommendation(s): Normal biventricular function without evidence of hemodynamically significant valvular heart disease. FINDINGS  Left Ventricle: Left ventricular ejection fraction, by estimation, is 55 to 60%. The left ventricle has normal function. The left ventricle has no regional wall motion abnormalities. The left ventricular internal cavity size  was normal in size. There is  no left ventricular hypertrophy. Left ventricular diastolic parameters are indeterminate. Right Ventricle: The right ventricular size is normal. No increase in right ventricular wall thickness. Right ventricular systolic function is normal. There is normal pulmonary artery systolic pressure. The tricuspid regurgitant velocity is 2.02 m/s, and  with an assumed right atrial pressure of 3  mmHg, the estimated right ventricular systolic pressure is 20.3 mmHg. Left Atrium: Left atrial size was normal in size. Right Atrium: Right atrial size was normal in size. Pericardium: There is no evidence of pericardial effusion. Presence of pericardial fat pad. Mitral Valve: The mitral valve is normal in structure. Trivial mitral valve regurgitation. No evidence of mitral valve stenosis. Tricuspid Valve: The tricuspid valve is normal in structure. Tricuspid valve regurgitation is mild. Aortic Valve: The aortic valve is tricuspid. There is mild calcification of the aortic valve. Aortic valve regurgitation is trivial. Aortic regurgitation PHT measures 667 msec. Mild aortic valve sclerosis is present, with no evidence of aortic valve stenosis. Pulmonic Valve: The pulmonic valve was not well visualized. Pulmonic valve regurgitation is not visualized. Aorta: The aortic root, ascending aorta, aortic arch and descending aorta are all structurally normal, with no evidence of dilitation or obstruction. Venous: The inferior vena cava is normal in size with greater than 50% respiratory variability, suggesting right atrial pressure of 3 mmHg. IAS/Shunts: The atrial septum is grossly normal.  LEFT VENTRICLE PLAX 2D LVIDd:         3.80 cm  Diastology LVIDs:         3.20 cm  LV e' medial:    5.66 cm/s LV PW:         1.00 cm  LV E/e' medial:  10.4 LV IVS:        1.00 cm  LV e' lateral:   6.20 cm/s LVOT diam:     1.90 cm  LV E/e' lateral: 9.5 LV SV:         52 LV SV Index:   31 LVOT Area:     2.84 cm                          3D Volume EF:                         3D EF:        59 %                         LV EDV:       107 ml                         LV ESV:       43 ml                         LV SV:        63 ml RIGHT VENTRICLE            IVC RV Basal diam:  2.40 cm    IVC diam: 1.90 cm RV S prime:     9.57 cm/s TAPSE (M-mode): 1.8 cm LEFT ATRIUM             Index       RIGHT ATRIUM           Index LA diam:  4.20 cm 2.51  cm/m  RA Area:     11.80 cm LA Vol (A2C):   53.7 ml 32.12 ml/m RA Volume:   23.60 ml  14.12 ml/m LA Vol (A4C):   30.0 ml 17.95 ml/m LA Biplane Vol: 39.8 ml 23.81 ml/m  AORTIC VALVE LVOT Vmax:   77.60 cm/s LVOT Vmean:  53.800 cm/s LVOT VTI:    0.183 m AI PHT:      667 msec  AORTA Ao Root diam: 3.30 cm Ao Asc diam:  3.20 cm MITRAL VALVE               TRICUSPID VALVE MV Area (PHT): 3.26 cm    TR Peak grad:   16.3 mmHg MV Decel Time: 233 msec    TR Vmax:        202.00 cm/s MV E velocity: 58.90 cm/s MV A velocity: 76.70 cm/s  SHUNTS MV E/A ratio:  0.77        Systemic VTI:  0.18 m                            Systemic Diam: 1.90 cm Buford Dresser MD Electronically signed by Buford Dresser MD Signature Date/Time: 05/07/2020/8:04:00 AM    Final      Assessment & Plan:  Plan  I am having Royann H. Meddings maintain her triamcinolone, Fish Oil, cetirizine, Glucosamine HCl-MSM (MSM GLUCOSAMINE PO), cholecalciferol, Multiple Vitamins-Minerals (CENTRUM SILVER PO), triamcinolone, aspirin EC, ELDERBERRY PO, lidocaine, zinc gluconate, vitamin C, OVER THE COUNTER MEDICATION, and atorvastatin.  No orders of the defined types were placed in this encounter.   Problem List Items Addressed This Visit      Unprioritized   Brain aneurysm    F/u neurosurgery per er       Relevant Orders   Ambulatory referral to Neurosurgery   Transient global amnesia - Primary    Pt to f/u neuro con't aspirin  Echo done Mon as ordered by er ---- normal       Relevant Orders   Ambulatory referral to Neurology   Ambulatory referral to Neurosurgery      Follow-up: Return in about 8 weeks (around 07/03/2020), or if symptoms worsen or fail to improve, for hyperlipidemia.  Ann Held, DO

## 2020-05-08 NOTE — Patient Instructions (Signed)
Transient Global Amnesia Transient global amnesia causes a sudden and temporary (transient) loss of memory (amnesia). You may recall memories from your distant past and people you know well. However, you may not recall things that happened more recently in the past days, months, or even year. A transient global amnesia episode does not last longer than 24 hours. Transient global amnesia does not affect your other brain functions. Your memory usually returns to normal after an episode is over. One episode of transient global amnesia does not make you more likely to have a stroke, a relapse, or other complications. What are the causes? The cause of this condition is not known. Certain activities have been reported to trigger transient global amnesia. These activities include:  Swimming in very cold or hot water.  Sexual intercourse.  Emotional distress, such as receiving bad news or having a lot of stress at once.  Strenuous exercise or activity. What increases the risk? You are more likely to develop this condition if:  You are 25-86 years old.  You have a history of migraine headaches. What are the signs or symptoms? The main symptoms of this condition include:  Being unable to remember recent events.  Asking repetitive questions about a situation and surroundings and not recalling the answers to these questions. Other symptoms include:  Restlessness and nervousness.  Confusion.  Headaches.  Dizziness.  Nausea. How is this diagnosed? This condition may be diagnosed based on:  Your symptoms.  A physical exam.  A test to check your mental abilities (cognitive evaluation).  Imaging studies to check brain function. These may include: ? Electroencephalogram (EEG). This test checks the brain's electrical activity. ? CT scan. ? MRI.   How is this treated? There is no treatment for this condition. An episode typically goes away on its own after a few hours. You may receive  medicines to treat other conditions, such as a migraine. Follow these instructions at home:  Take over-the-counter and prescription medicines only as told by your health care provider.  Avoid taking medicines that can affect thinking, such as pain or sleeping medicines.  Learn what activities may trigger an episode. Avoid these activities as told by your health care provider.  Find ways to manage stress, such as meditation or yoga.  Keep all follow-up visits as told by your health care provider. This is important. Contact a health care provider if you:  Have a migraine that does not go away.  Experience transient global amnesia repeatedly. Get help right away if you:  Have a seizure. Summary  Transient global amnesia causes a sudden and temporary (transient) loss of memory (amnesia).  There is no treatment for this condition. An episode typically goes away on its own after a few hours.  You may receive medicines to treat other conditions, such as a migraine.  Transient global amnesia does not affect your other brain functions. Your memory usually returns to normal after an episode is over. This information is not intended to replace advice given to you by your health care provider. Make sure you discuss any questions you have with your health care provider. Document Revised: 04/27/2017 Document Reviewed: 04/27/2017 Elsevier Patient Education  2021 [employer].

## 2020-05-08 NOTE — Assessment & Plan Note (Signed)
F/u neurosurgery per er

## 2020-05-13 DIAGNOSIS — I671 Cerebral aneurysm, nonruptured: Secondary | ICD-10-CM | POA: Diagnosis not present

## 2020-05-13 DIAGNOSIS — Z6826 Body mass index (BMI) 26.0-26.9, adult: Secondary | ICD-10-CM | POA: Diagnosis not present

## 2020-05-13 DIAGNOSIS — R030 Elevated blood-pressure reading, without diagnosis of hypertension: Secondary | ICD-10-CM | POA: Diagnosis not present

## 2020-05-20 ENCOUNTER — Encounter: Payer: Self-pay | Admitting: Family Medicine

## 2020-05-20 NOTE — Telephone Encounter (Signed)
Wrong chart

## 2020-05-20 NOTE — Telephone Encounter (Signed)
Tramadol is a controlled substance so I would not have said I could send in six month I can put 1 refill on tramadol  She does not have to come in for 6 months ---- but will have to have the pharmacy call for refill after 2

## 2020-05-21 NOTE — Telephone Encounter (Signed)
Yes-- sorry --  Not sure how that happened  She should wait at least 6-7 days to get tested unless she starts having symptoms  and quarantine for those 6-7 days

## 2020-05-22 DIAGNOSIS — B349 Viral infection, unspecified: Secondary | ICD-10-CM | POA: Diagnosis not present

## 2020-05-22 DIAGNOSIS — J069 Acute upper respiratory infection, unspecified: Secondary | ICD-10-CM | POA: Diagnosis not present

## 2020-05-23 ENCOUNTER — Other Ambulatory Visit: Payer: Medicare PPO

## 2020-05-23 DIAGNOSIS — Z20822 Contact with and (suspected) exposure to covid-19: Secondary | ICD-10-CM | POA: Diagnosis not present

## 2020-05-24 LAB — SARS-COV-2, NAA 2 DAY TAT

## 2020-05-24 LAB — NOVEL CORONAVIRUS, NAA: SARS-CoV-2, NAA: DETECTED — AB

## 2020-05-25 ENCOUNTER — Other Ambulatory Visit: Payer: Self-pay | Admitting: Unknown Physician Specialty

## 2020-05-25 ENCOUNTER — Telehealth: Payer: Self-pay | Admitting: Unknown Physician Specialty

## 2020-05-25 DIAGNOSIS — U071 COVID-19: Secondary | ICD-10-CM

## 2020-05-25 NOTE — Telephone Encounter (Signed)
I connected by phone with Gardiner Coins on 05/25/2020 at 11:46 AM to discuss the potential use of a new treatment for mild to moderate COVID-19 viral infection in non-hospitalized patients.  This patient is a 75 y.o. female that meets the FDA criteria for Emergency Use Authorization of COVID monoclonal antibody sotrovimab.  Has a (+) direct SARS-CoV-2 viral test result  Has mild or moderate COVID-19   Is NOT hospitalized due to COVID-19  Is within 10 days of symptom onset  Has at least one of the high risk factor(s) for progression to severe COVID-19 and/or hospitalization as defined in EUA.  Specific high risk criteria : Older age (>/= 75 yo)   I have spoken and communicated the following to the patient or parent/caregiver regarding COVID monoclonal antibody treatment:  1. FDA has authorized the emergency use for the treatment of mild to moderate COVID-19 in adults and pediatric patients with positive results of direct SARS-CoV-2 viral testing who are 52 years of age and older weighing at least 40 kg, and who are at high risk for progressing to severe COVID-19 and/or hospitalization.  2. The significant known and potential risks and benefits of COVID monoclonal antibody, and the extent to which such potential risks and benefits are unknown.  3. Information on available alternative treatments and the risks and benefits of those alternatives, including clinical trials.  4. Patients treated with COVID monoclonal antibody should continue to self-isolate and use infection control measures (e.g., wear mask, isolate, social distance, avoid sharing personal items, clean and disinfect "high touch" surfaces, and frequent handwashing) according to CDC guidelines.   5. The patient or parent/caregiver has the option to accept or refuse COVID monoclonal antibody treatment.  After reviewing this information with the patient, the patient has agreed to receive one of the available covid 19  monoclonal antibodies and will be provided an appropriate fact sheet prior to infusion. Kathrine Haddock, NP 05/25/2020 11:46 AM  Sx onset 12/26

## 2020-05-26 ENCOUNTER — Ambulatory Visit (HOSPITAL_COMMUNITY)
Admission: RE | Admit: 2020-05-26 | Discharge: 2020-05-26 | Disposition: A | Discharge status: Home / Self Care | Payer: Medicare PPO | Source: Ambulatory Visit | Attending: Pulmonary Disease | Admitting: Pulmonary Disease

## 2020-05-26 DIAGNOSIS — U071 COVID-19: Secondary | ICD-10-CM

## 2020-05-26 DIAGNOSIS — R54 Age-related physical debility: Secondary | ICD-10-CM | POA: Insufficient documentation

## 2020-05-26 MED ORDER — SODIUM CHLORIDE 0.9 % IV SOLN: INTRAVENOUS | Status: DC | PRN

## 2020-05-26 MED ORDER — METHYLPREDNISOLONE SODIUM SUCC 125 MG IJ SOLR: 125.0000 mg | Freq: Once | INTRAMUSCULAR | Status: DC | PRN

## 2020-05-26 MED ORDER — ALBUTEROL SULFATE HFA 108 (90 BASE) MCG/ACT IN AERS: 2.0000 | INHALATION_SPRAY | Freq: Once | RESPIRATORY_TRACT | Status: DC | PRN

## 2020-05-26 MED ORDER — FAMOTIDINE IN NACL 20-0.9 MG/50ML-% IV SOLN: 20.0000 mg | Freq: Once | INTRAVENOUS | Status: DC | PRN

## 2020-05-26 MED ORDER — EPINEPHRINE 0.3 MG/0.3ML IJ SOAJ: 0.3000 mg | Freq: Once | INTRAMUSCULAR | Status: DC | PRN

## 2020-05-26 MED ORDER — SOTROVIMAB 500 MG/8ML IV SOLN
500.0000 mg | Freq: Once | INTRAVENOUS | Status: AC
  Administered 2020-05-26: 500 mg via INTRAVENOUS

## 2020-05-26 MED ORDER — DIPHENHYDRAMINE HCL 50 MG/ML IJ SOLN: 50.0000 mg | Freq: Once | INTRAMUSCULAR | Status: DC | PRN

## 2020-05-26 NOTE — Discharge Instructions (Signed)

## 2020-05-26 NOTE — Progress Notes (Signed)
Diagnosis: COVID-19  Physician: Dr. Patrick Wright  Procedure: [facility] Med: Sotrovimab infusion - Provided patient with sotrovimab fact sheet for patients, parents, and caregivers prior to infusion.   Complications: No immediate complications noted  Discharge: Discharged home    

## 2020-05-26 NOTE — Progress Notes (Signed)
Patient reviewed Fact Sheet for Patients, Parents, and Caregivers for Emergency Use Authorization (EUA) of sotrovimab for the Treatment of Coronavirus. Patient also reviewed and is agreeable to the estimated cost of treatment. Patient is agreeable to proceed.   

## 2020-06-05 DIAGNOSIS — M1711 Unilateral primary osteoarthritis, right knee: Secondary | ICD-10-CM | POA: Diagnosis not present

## 2020-06-25 ENCOUNTER — Encounter: Payer: Self-pay | Admitting: Family Medicine

## 2020-06-25 NOTE — Telephone Encounter (Signed)
Pt has lab appt on March 14. No future labs ordered.

## 2020-06-26 ENCOUNTER — Other Ambulatory Visit: Payer: Self-pay | Admitting: Family Medicine

## 2020-06-26 DIAGNOSIS — Z8616 Personal history of COVID-19: Secondary | ICD-10-CM

## 2020-06-26 DIAGNOSIS — E559 Vitamin D deficiency, unspecified: Secondary | ICD-10-CM

## 2020-06-26 DIAGNOSIS — E785 Hyperlipidemia, unspecified: Secondary | ICD-10-CM

## 2020-07-02 ENCOUNTER — Ambulatory Visit (INDEPENDENT_AMBULATORY_CARE_PROVIDER_SITE_OTHER): Payer: Medicare PPO | Admitting: Neurology

## 2020-07-02 ENCOUNTER — Encounter: Payer: Self-pay | Admitting: Neurology

## 2020-07-02 VITALS — BP 136/79 | HR 72 | Ht 63.5 in | Wt 140.2 lb

## 2020-07-02 DIAGNOSIS — G454 Transient global amnesia: Secondary | ICD-10-CM | POA: Diagnosis not present

## 2020-07-02 DIAGNOSIS — I671 Cerebral aneurysm, nonruptured: Secondary | ICD-10-CM

## 2020-07-02 DIAGNOSIS — R413 Other amnesia: Secondary | ICD-10-CM | POA: Diagnosis not present

## 2020-07-02 NOTE — Patient Instructions (Signed)
I had a long discussion with the patient and her husband regarding her transient episode of memory loss and confusion which seems quite typical for transient global amnesia.  We also discussed briefly about CT angiogram findings showing 2 to 3 mm supraclinoid infundibulum versus small aneurysm which can be managed conservatively.  Recommend we repeat EEG to look for any seizure activity.  She will return for follow-up in the future in 3 months or call earlier if necessary.  Transient Global Amnesia Transient global amnesia causes a sudden and temporary (transient) loss of memory (amnesia). You may recall memories from your distant past and people you know well. However, you may not recall things that happened more recently in the past days, months, or even year. A transient global amnesia episode does not last longer than 24 hours. Transient global amnesia does not affect your other brain functions. Your memory usually returns to normal after an episode is over. One episode of transient global amnesia does not make you more likely to have a stroke, a relapse, or other complications. What are the causes? The cause of this condition is not known. Certain activities have been reported to trigger transient global amnesia. These activities include:  Swimming in very cold or hot water.  Sexual intercourse.  Emotional distress, such as receiving bad news or having a lot of stress at once.  Strenuous exercise or activity. What increases the risk? You are more likely to develop this condition if:  You are 79-54 years old.  You have a history of migraine headaches. What are the signs or symptoms? The main symptoms of this condition include:  Being unable to remember recent events.  Asking repetitive questions about a situation and surroundings and not recalling the answers to these questions. Other symptoms include:  Restlessness and  nervousness.  Confusion.  Headaches.  Dizziness.  Nausea. How is this diagnosed? This condition may be diagnosed based on:  Your symptoms.  A physical exam.  A test to check your mental abilities (cognitive evaluation).  Imaging studies to check brain function. These may include: ? Electroencephalogram (EEG). This test checks the brain's electrical activity. ? CT scan. ? MRI.   How is this treated? There is no treatment for this condition. An episode typically goes away on its own after a few hours. You may receive medicines to treat other conditions, such as a migraine. Follow these instructions at home:  Take over-the-counter and prescription medicines only as told by your health care provider.  Avoid taking medicines that can affect thinking, such as pain or sleeping medicines.  Learn what activities may trigger an episode. Avoid these activities as told by your health care provider.  Find ways to manage stress, such as meditation or yoga.  Keep all follow-up visits as told by your health care provider. This is important. Contact a health care provider if you:  Have a migraine that does not go away.  Experience transient global amnesia repeatedly. Get help right away if you:  Have a seizure. Summary  Transient global amnesia causes a sudden and temporary (transient) loss of memory (amnesia).  There is no treatment for this condition. An episode typically goes away on its own after a few hours.  You may receive medicines to treat other conditions, such as a migraine.  Transient global amnesia does not affect your other brain functions. Your memory usually returns to normal after an episode is over. This information is not intended to replace advice given to you by  your health care provider. Make sure you discuss any questions you have with your health care provider. Document Revised: 04/27/2017 Document Reviewed: 04/27/2017 Elsevier Patient Education  2021  [employer].

## 2020-07-02 NOTE — Progress Notes (Signed)
[name] [address] [place]. [place] [address] [phone]       OFFICE CONSULT NOTE  [name] Date of Birth:  April 16, 1946 Medical Record Number:  [id number]   Referring MD: [name]  Reason for Referral: Memory loss HPI: Kim Mclaughlin is a pleasant 69 Caucasian lady seen today for initial office consultation visit for 75-year-old for episode of memory loss.  History is obtained from the patient and her husband as well as review of electronic medical records and I personally reviewed pertinent imaging films in PACS.  She has past medical history for hyperlipidemia, gastroesophageal reflux disease and DVT.  She presented on 05/01/2020 with an episode of sudden onset of confusion and short-term memory difficulties.  She woke up that day from his usual state of health and drove to her appointment.  After that she apparently went [place] to run some errands but around 2 PM she got a phone call from her sister who noted that the patient appeared to be confused she could not remember that her older sister was sick.  She also said that she was wandering around and could not find her car.  Patient's husband subsequently came there and noticed that the patient had poor short-term memory and kept on asking the same questions over again.  She did report a mild headache.  She was taken to hospital with she still does not remember the details of the work-up.  CT scan was unremarkable and CT angiogram showed 2 to 3 mm dilatation of supraclinoid ICAs bilaterally possible infundibulum was a small aneurysm.  MRI scan was negative for any acute abnormalities.  EEG was obtained which was normal without any seizure activity.  Patient's condition improved after she was hospitalized in she started remembering new information since hospitalization but still could not remember for 2 hours.  That afternoon.  Patient states she has done well since then.  She said no problems  making new memories after discharge but still has very patchy memory of that afternoon to our.  She has no prior history of seizures, significant head injury, loss of consciousness, strokes, TIA.  She denies being under significant stress for this.  She did see Dr. Marcello Moores neurosurgeon as an outpatient who recommended conservative follow-up for her 2 to 3 mm supraclinoid Immuzim versus infundibulum.  I do not have those records for my review today. ROS:   14 system review of systems is positive for memory loss, confusion, disorientation and all other systems negative  PMH:  Past Medical History:  Diagnosis Date  . Allergy   . Arthritis 07/2014   Right hand, prescribed prednisone-no longer taking. also Rt knww  . DVT (deep venous thrombosis) ([place]) 2015   very small; treated with compression and heat, no other problems since  . GERD (gastroesophageal reflux disease)    patient denies states she does have hx of peptic ulcer many years  . Hepatitis    in 10 th grade  (not sure type- not hep C) - treated  no other problems  . Hyperlipidemia    diet controlled  . MVP (mitral valve prolapse)    no problems, MD told patient she could not hear it  . Osteopenia    [facility]  . PONV (postoperative nausea and vomiting)    with ear surgery only - No other problems with other surgeries  . Precancerous lesion 12/2014   Treated with cream only  . Varicose veins  Social History:  Social History   Socioeconomic History  . Marital status: Married    Spouse name: Not on file  . Number of children: Not on file  . Years of education: Not on file  . Highest education level: Not on file  Occupational History  . Occupation: retired  Tobacco Use  . Smoking status: Former Smoker    Types: Cigarettes    Quit date: 04/27/1963    Years since quitting: 57.2  . Smokeless tobacco: Never Used  . Tobacco comment: Smoked < 6 mos;< 1 pack total;quit 1965  Vaping Use  . Vaping Use: Never used  Substance and  Sexual Activity  . Alcohol use: No  . Drug use: No  . Sexual activity: Yes    Birth control/protection: Post-menopausal  Other Topics Concern  . Not on file  Social History Narrative   Lives with husband   Right Handed   Drinks average 4 cups caffeine daily   Social Determinants of Health   Financial Resource Strain: Low Risk   . Difficulty of Paying Living Expenses: Not hard at all  Food Insecurity: No Food Insecurity  . Worried About Charity fundraiser in the Last Year: Never true  . Ran Out of Food in the Last Year: Never true  Transportation Needs: No Transportation Needs  . Lack of Transportation (Medical): No  . Lack of Transportation (Non-Medical): No  Physical Activity: Not on file  Stress: Not on file  Social Connections: Not on file  Intimate Partner Violence: Not on file    Medications:   Current Outpatient Medications on File Prior to Visit  Medication Sig Dispense Refill  . Ascorbic Acid (VITAMIN C) 1000 MG tablet Take 1,000 mg by mouth daily.    Marland Kitchen aspirin EC 81 MG tablet Take 81 mg by mouth daily.    Marland Kitchen atorvastatin (LIPITOR) 80 MG tablet Take 1 tablet (80 mg total) by mouth daily. 30 tablet 3  . cetirizine (ZYRTEC) 10 MG tablet Take 10 mg by mouth daily as needed for allergies.     . cholecalciferol (VITAMIN D) 1000 units tablet Take 1,000 Units by mouth daily.    Marland Kitchen ELDERBERRY PO Take 2 lozenges by mouth daily.     . Glucosamine HCl-MSM (MSM GLUCOSAMINE PO) Take 2 tablets by mouth daily.     Marland Kitchen lidocaine (LIDODERM) 5 % Place 1 patch onto the skin daily. Remove & Discard patch within 12 hours or as directed by MD (Patient taking differently: Place 1 patch onto the skin daily.) 30 patch 0  . Multiple Vitamins-Minerals (CENTRUM SILVER PO) Take 1 tablet by mouth daily.    . Omega-3 Fatty Acids (FISH OIL) 1000 MG CAPS Take 1 capsule by mouth daily.     Marland Kitchen OVER THE COUNTER MEDICATION Take 1 tablet by mouth 3 (three) times daily. Bone strength    . triamcinolone  (NASACORT) 55 MCG/ACT AERO nasal inhaler Place 2 sprays into the nose daily as needed (allergies).     . triamcinolone cream (KENALOG) 0.1 % Apply 1 application topically 2 (two) times daily as needed (eczema.).     Marland Kitchen zinc gluconate 50 MG tablet Take 50 mg by mouth daily.     No current facility-administered medications on file prior to visit.    Allergies:   Allergies  Allergen Reactions  . Macrobid  [Nitrofurantoin Macrocrystal] Other (See Comments)  . Doxycycline Hives  . Pravastatin Sodium     REACTION: shin pain  . Amoxicillin-Pot Clavulanate  GI symptoms; abdominal pain & nausea NOTE: able to take Ampicillin  . Augmentin [Amoxicillin-Pot Clavulanate] Nausea Only    Upsets stomach  . Codeine Nausea Only  . Methylphenidate Hcl Palpitations  . Methylprednisolone Rash    Rash and redness on face  . Sulfamethoxazole-Trimethoprim Nausea Only     nausea    Physical Exam General: Frail elderly Caucasian lady seated, in no evident distress Head: head normocephalic and atraumatic.   Neck: supple with no carotid or supraclavicular bruits Cardiovascular: regular rate and rhythm, no murmurs Musculoskeletal: no deformity Skin:  no rash/petichiae Vascular:  Normal pulses all extremities  Neurologic Exam Mental Status: Awake and fully alert. Oriented to place and time. Recent and remote memory intact. Attention span, concentration and fund of knowledge appropriate. Mood and affect appropriate.  Recall 3/3.  Able to name 15 animals that can walk on 4 legs.  Clock drawing 4/4. Cranial Nerves: Fundoscopic exam reveals sharp disc margins. Pupils equal, briskly reactive to light. Extraocular movements full without nystagmus. Visual fields full to confrontation. Hearing intact. Facial sensation intact. Face, tongue, palate moves normally and symmetrically.  Motor: Normal bulk and tone. Normal strength in all tested extremity muscles. Sensory.: intact to touch , pinprick , position and  vibratory sensation.  Coordination: Rapid alternating movements normal in all extremities. Finger-to-nose and heel-to-shin performed accurately bilaterally. Gait and Station: Arises from chair without difficulty. Stance is normal. Gait demonstrates normal stride length and balance . Able to heel, toe and tandem walk with mild difficulty.  Reflexes: 1+ and symmetric. Toes downgoing.       ASSESSMENT: 75 year old Caucasian lady with transient episode of memory loss and confusion likely due to transient global amnesia.  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     PLAN: I had a long discussion with the patient and her husband regarding her transient episode of memory loss and confusion which seems quite typical for transient global amnesia.  We also discussed briefly about CT angiogram findings showing 2 to 3 mm supraclinoid infundibulum versus small aneurysm which can be managed conservatively.  Recommend we repeat EEG to look for any seizure activity.  She will return for follow-up in the future in 3 months or call earlier if necessary.  Greater than 50% time during this 45-minute consultation visit was spent on counseling and coordination of care about her episode of memory loss and asymptomatic aneurysm versus infundibulum discussion Kim Contras, MD Note: This document was prepared with digital dictation and possible smart phrase technology. Any transcriptional errors that result from this process are unintentional.

## 2020-07-07 ENCOUNTER — Encounter: Payer: Self-pay | Admitting: Family Medicine

## 2020-07-07 ENCOUNTER — Other Ambulatory Visit: Payer: Self-pay

## 2020-07-07 ENCOUNTER — Other Ambulatory Visit (INDEPENDENT_AMBULATORY_CARE_PROVIDER_SITE_OTHER): Payer: Medicare PPO

## 2020-07-07 DIAGNOSIS — Z8616 Personal history of COVID-19: Secondary | ICD-10-CM

## 2020-07-07 DIAGNOSIS — E559 Vitamin D deficiency, unspecified: Secondary | ICD-10-CM

## 2020-07-07 DIAGNOSIS — E785 Hyperlipidemia, unspecified: Secondary | ICD-10-CM

## 2020-07-07 LAB — COMPREHENSIVE METABOLIC PANEL
ALT: 28 U/L (ref 0–35)
AST: 24 U/L (ref 0–37)
Albumin: 4 g/dL (ref 3.5–5.2)
Alkaline Phosphatase: 78 U/L (ref 39–117)
BUN: 13 mg/dL (ref 6–23)
CO2: 30 mEq/L (ref 19–32)
Calcium: 9.8 mg/dL (ref 8.4–10.5)
Chloride: 105 mEq/L (ref 96–112)
Creatinine, Ser: 0.88 mg/dL (ref 0.40–1.20)
GFR: 64.41 mL/min (ref >60.00)
Glucose, Bld: 88 mg/dL (ref 70–99)
Potassium: 4.1 mEq/L (ref 3.5–5.1)
Sodium: 141 mEq/L (ref 135–145)
Total Bilirubin: 0.7 mg/dL (ref 0.2–1.2)
Total Protein: 6.7 g/dL (ref 6.0–8.3)

## 2020-07-07 LAB — LIPID PANEL
Cholesterol: 127 mg/dL (ref 0–200)
HDL: 69.1 mg/dL (ref >39.00)
LDL Cholesterol: 44 mg/dL (ref 0–99)
NonHDL: 58.11
Total CHOL/HDL Ratio: 2
Triglycerides: 69 mg/dL (ref 0.0–149.0)
VLDL: 13.8 mg/dL (ref 0.0–40.0)

## 2020-07-07 LAB — SARS-COV-2 IGG: SARS-COV-2 IgG: 21.36

## 2020-07-07 LAB — VITAMIN D 25 HYDROXY (VIT D DEFICIENCY, FRACTURES): VITD: 90.91 ng/mL (ref 30.00–100.00)

## 2020-07-30 ENCOUNTER — Ambulatory Visit (INDEPENDENT_AMBULATORY_CARE_PROVIDER_SITE_OTHER): Payer: Medicare PPO | Admitting: Neurology

## 2020-07-30 ENCOUNTER — Other Ambulatory Visit: Payer: Self-pay

## 2020-07-30 DIAGNOSIS — R410 Disorientation, unspecified: Secondary | ICD-10-CM | POA: Diagnosis not present

## 2020-07-30 DIAGNOSIS — R413 Other amnesia: Secondary | ICD-10-CM

## 2020-07-30 DIAGNOSIS — G454 Transient global amnesia: Secondary | ICD-10-CM

## 2020-08-04 NOTE — Progress Notes (Signed)
Kindly inform the patient that EEG study was normal

## 2020-08-05 ENCOUNTER — Encounter: Payer: Self-pay | Admitting: *Deleted

## 2020-09-03 ENCOUNTER — Encounter: Payer: Self-pay | Admitting: Family Medicine

## 2020-09-03 MED ORDER — ATORVASTATIN CALCIUM 80 MG PO TABS: 80.0000 mg | ORAL_TABLET | Freq: Every day | ORAL | 0 refills | Status: DC

## 2020-09-04 DIAGNOSIS — M25561 Pain in right knee: Secondary | ICD-10-CM | POA: Diagnosis not present

## 2020-09-30 ENCOUNTER — Other Ambulatory Visit: Payer: Self-pay | Admitting: Family Medicine

## 2020-10-20 ENCOUNTER — Other Ambulatory Visit: Payer: Self-pay

## 2020-10-21 ENCOUNTER — Other Ambulatory Visit: Payer: Self-pay

## 2020-10-21 ENCOUNTER — Encounter: Payer: Self-pay | Admitting: Family Medicine

## 2020-10-21 ENCOUNTER — Ambulatory Visit (INDEPENDENT_AMBULATORY_CARE_PROVIDER_SITE_OTHER): Payer: Medicare PPO | Admitting: Family Medicine

## 2020-10-21 VITALS — BP 124/84 | HR 80 | Temp 98.1°F | Resp 18 | Ht 63.5 in | Wt 144.4 lb

## 2020-10-21 DIAGNOSIS — E785 Hyperlipidemia, unspecified: Secondary | ICD-10-CM

## 2020-10-21 DIAGNOSIS — R252 Cramp and spasm: Secondary | ICD-10-CM

## 2020-10-21 DIAGNOSIS — Z0000 Encounter for general adult medical examination without abnormal findings: Secondary | ICD-10-CM

## 2020-10-21 DIAGNOSIS — G454 Transient global amnesia: Secondary | ICD-10-CM | POA: Diagnosis not present

## 2020-10-21 DIAGNOSIS — M810 Age-related osteoporosis without current pathological fracture: Secondary | ICD-10-CM

## 2020-10-21 LAB — LIPID PANEL
Cholesterol: 131 mg/dL (ref 0–200)
HDL: 76.7 mg/dL (ref >39.00)
LDL Cholesterol: 44 mg/dL (ref 0–99)
NonHDL: 54.17
Total CHOL/HDL Ratio: 2
Triglycerides: 52 mg/dL (ref 0.0–149.0)
VLDL: 10.4 mg/dL (ref 0.0–40.0)

## 2020-10-21 LAB — CBC WITH DIFFERENTIAL/PLATELET
Basophils Absolute: 0.1 10*3/uL (ref 0.0–0.1)
Basophils Relative: 1 % (ref 0.0–3.0)
Eosinophils Absolute: 0.2 10*3/uL (ref 0.0–0.7)
Eosinophils Relative: 3.9 % (ref 0.0–5.0)
HCT: 40 % (ref 36.0–46.0)
Hemoglobin: 13.2 g/dL (ref 12.0–15.0)
Lymphocytes Relative: 35.9 % (ref 12.0–46.0)
Lymphs Abs: 2.2 10*3/uL (ref 0.7–4.0)
MCHC: 32.9 g/dL (ref 30.0–36.0)
MCV: 94.1 fl (ref 78.0–100.0)
Monocytes Absolute: 0.5 10*3/uL (ref 0.1–1.0)
Monocytes Relative: 8.4 % (ref 3.0–12.0)
Neutro Abs: 3.2 10*3/uL (ref 1.4–7.7)
Neutrophils Relative %: 50.8 % (ref 43.0–77.0)
Platelets: 277 10*3/uL (ref 150.0–400.0)
RBC: 4.25 Mil/uL (ref 3.87–5.11)
RDW: 13.1 % (ref 11.5–15.5)
WBC: 6.3 10*3/uL (ref 4.0–10.5)

## 2020-10-21 LAB — COMPREHENSIVE METABOLIC PANEL
ALT: 26 U/L (ref 0–35)
AST: 25 U/L (ref 0–37)
Albumin: 4.3 g/dL (ref 3.5–5.2)
Alkaline Phosphatase: 87 U/L (ref 39–117)
BUN: 14 mg/dL (ref 6–23)
CO2: 27 mEq/L (ref 19–32)
Calcium: 10 mg/dL (ref 8.4–10.5)
Chloride: 103 mEq/L (ref 96–112)
Creatinine, Ser: 0.82 mg/dL (ref 0.40–1.20)
GFR: 69.97 mL/min (ref >60.00)
Glucose, Bld: 90 mg/dL (ref 70–99)
Potassium: 3.8 mEq/L (ref 3.5–5.1)
Sodium: 139 mEq/L (ref 135–145)
Total Bilirubin: 0.6 mg/dL (ref 0.2–1.2)
Total Protein: 6.8 g/dL (ref 6.0–8.3)

## 2020-10-21 LAB — PHOSPHORUS: Phosphorus: 3.4 mg/dL (ref 2.3–4.6)

## 2020-10-21 LAB — MAGNESIUM: Magnesium: 2 mg/dL (ref 1.5–2.5)

## 2020-10-21 NOTE — Progress Notes (Signed)
Subjective:   By signing my name below, I, Kim Mclaughlin, attest that this documentation has been prepared under the direction and in the presence of Dr. Roma Schanz, DO. 10/21/2020     Patient ID: Kim Mclaughlin, female    DOB: [birth year], 75 y.o.   MRN: [id number]  Chief Complaint  Patient presents with   Annual Exam    Pt states fasting     HPI Patient is in today for a comprehensive physical exam.  She continues taking 80 mg Lipitor daily PO and reports no new issues while taking it.  She reports cramps in her right inner thigh. She occasionally gets them in her left inner thigh. She continues seeing her dermatologist. She continues seeing her orthopedic surgeon, Dr. Gladstone Lighter, to manage her knee pain. She denies having any fever, new skin moles, ear pain, congestion, sinus pain, sore throat, eye pain, chest pain, palpations, cough, SOB, wheezing, n/v/d, constipation, blood in stool, dysuria, frequency, hematuria, or headaches at this time. She is UTD on bone density scans at this time. She is due for a colonoscopy. She does not have the Covid-19 vaccines and is not willing to get it at this time. Her eldest sister had episodes of multiple seizures in January, 2022. She has no history of seizures and no family history of seizures. She does not know if her issues are hereditary at this time.   Past Medical History:  Diagnosis Date   Allergy    Arthritis 07/2014   Right hand, prescribed prednisone-no longer taking. also Rt knww   DVT (deep venous thrombosis) ([place]) 2015   very small; treated with compression and heat, no other problems since   GERD (gastroesophageal reflux disease)    patient denies states she does have hx of peptic ulcer many years   Hepatitis    in 10 th grade  (not sure type- not hep C) - treated  no other problems   Hyperlipidemia    diet controlled   MVP (mitral valve prolapse)    no problems, MD told patient she could not hear it   Osteopenia     [facility]   PONV (postoperative nausea and vomiting)    with ear surgery only - No other problems with other surgeries   Precancerous lesion 12/2014   Treated with cream only   Varicose veins     Past Surgical History:  Procedure Laterality Date   APPENDECTOMY     BUNIONECTOMY WITH HAMMERTOE RECONSTRUCTION Left 04/22/2016   Procedure: MODIFIED MCBRIDE BUNIONECTOMY,  LEFT SECOND HAMMERTOE CORRECTION;  Surgeon: Wylene Simmer, MD;  Location: [facility];  Service: Orthopedics;  Laterality: Left;   COLONOSCOPY     07/2012 Olevia Perches - [facility]/father - polyps   colonoscopy with polypectomy  07/2012    hyperplastic;Dr Brodie   DILATION AND CURETTAGE OF UTERUS     DILATION AND CURETTAGE OF UTERUS N/A 09/03/2014   Procedure: DILATATION AND CURETTAGE;  Surgeon: Harle Battiest, MD;  Location: [facility];  Service: Gynecology;  Laterality: N/A;   ENDOVENOUS ABLATION SAPHENOUS VEIN W/ LASER Left 01-31-2014   EVLA  left greater saphenous vein  by Curt Jews MD   ENDOVENOUS ABLATION SAPHENOUS VEIN W/ LASER Right 03-14-2014   endovenous laser ablation right greater saphenous vein  by Curt Jews MD   FOOT ARTHRODESIS Left 04/22/2016   Procedure: LEFT FIRST TARSAL METATARSAL ARTHRODESIS;  Surgeon: Wylene Simmer, MD;  Location: [facility];  Service: Orthopedics;  Laterality: Left;  G 3 P 3     HYSTEROSCOPY WITH D & C N/A 03/25/2016   Procedure: DILATATION AND CURETTAGE /HYSTEROSCOPY;  Surgeon: Jerelyn Charles, MD;  Location: [facility];  Service: Gynecology;  Laterality: N/A;  WITH ULTRASOUND GUIDANCE   left ear surgery     STAPEDECTOMY     TARSAL METATARSAL FUSION WITH WEIL OSTEOTOMY Left 04/22/2016   Procedure: 2-4 TARSAL METATARSAL FUSION WITH WEIL OSTEOTOMY;  Surgeon: Wylene Simmer, MD;  Location: [facility];  Service: Orthopedics;  Laterality: Left;   TUBAL LIGATION      Family History  Problem Relation Age of Onset   Pancreatic cancer Mother    Miscarriages /  [place] Mother    Thyroid disease Mother        partial thyroidectomy ; S/P RAI   Heart attack Father        in early 60s   Colon cancer Father    Seizures Sister    Colon cancer Paternal Grandmother    Stroke Paternal Grandmother        > 34   Heart attack Paternal Grandfather        in 72s   Ovarian cancer Maternal Aunt    Breast cancer Paternal Aunt    Aneurysm Paternal Uncle        brain   Cancer Other        among paternal sibs (colon, breast, bone)   Diabetes Neg Hx    Stomach cancer Neg Hx    Ulcerative colitis Neg Hx     Social History   Socioeconomic History   Marital status: Married    Spouse name: [name]   Number of children: Not on file   Years of education: Not on file   Highest education level: Not on file  Occupational History   Occupation: retired  Tobacco Use   Smoking status: Former    Pack years: 0.00    Types: Cigarettes    Quit date: 04/27/1963    Years since quitting: 57.5   Smokeless tobacco: Never   Tobacco comments:    Smoked < 6 mos;< 1 pack total;quit 1965  Vaping Use   Vaping Use: Never used  Substance and Sexual Activity   Alcohol use: No   Drug use: No   Sexual activity: Yes    Birth control/protection: Post-menopausal  Other Topics Concern   Not on file  Social History Narrative   Lives with husband   Right Handed   Drinks average 2-3 cups caffeine daily   Social Determinants of Health   Financial Resource Strain: Low Risk    Difficulty of Paying Living Expenses: Not hard at all  Food Insecurity: No Food Insecurity   Worried About Charity fundraiser in the Last Year: Never true   [place] in the Last Year: Never true  Transportation Needs: No Transportation Needs   Lack of Transportation (Medical): No   Lack of Transportation (Non-Medical): No  Physical Activity: Not on file  Stress: Not on file  Social Connections: Not on file  Intimate Partner Violence: Not on file    Outpatient Medications Prior to  Visit  Medication Sig Dispense Refill   Ascorbic Acid (VITAMIN C) 1000 MG tablet Take 1,000 mg by mouth daily.     aspirin EC 81 MG tablet Take 81 mg by mouth daily.     atorvastatin (LIPITOR) 80 MG tablet TAKE 1 TABLET BY MOUTH EVERY DAY 30 tablet 0   cetirizine (ZYRTEC)  10 MG tablet Take 10 mg by mouth daily as needed for allergies.      cholecalciferol (VITAMIN D) 1000 units tablet Take 1,000 Units by mouth daily.     ELDERBERRY PO Take 2 lozenges by mouth daily.      Glucosamine HCl-MSM (MSM GLUCOSAMINE PO) Take 2 tablets by mouth daily.      lidocaine (LIDODERM) 5 % Place 1 patch onto the skin daily. Remove & Discard patch within 12 hours or as directed by MD (Patient taking differently: Place 1 patch onto the skin daily.) 30 patch 0   Multiple Vitamins-Minerals (CENTRUM SILVER PO) Take 1 tablet by mouth daily.     Omega-3 Fatty Acids (FISH OIL) 1000 MG CAPS Take 1 capsule by mouth daily.      OVER THE COUNTER MEDICATION Take 1 tablet by mouth 3 (three) times daily. Bone strength     triamcinolone (NASACORT) 55 MCG/ACT AERO nasal inhaler Place 2 sprays into the nose daily as needed (allergies).      triamcinolone cream (KENALOG) 0.1 % Apply 1 application topically 2 (two) times daily as needed (eczema.).      zinc gluconate 50 MG tablet Take 50 mg by mouth daily.     No facility-administered medications prior to visit.    Allergies  Allergen Reactions   Macrobid  [Nitrofurantoin Macrocrystal] Other (See Comments)   Doxycycline Hives   Pravastatin Sodium     REACTION: shin pain   Amoxicillin-Pot Clavulanate     GI symptoms; abdominal pain & nausea NOTE: able to take Ampicillin   Augmentin [Amoxicillin-Pot Clavulanate] Nausea Only    Upsets stomach   Codeine Nausea Only   Methylphenidate Hcl Palpitations   Methylprednisolone Rash    Rash and redness on face   Sulfamethoxazole-Trimethoprim Nausea Only     nausea    Review of Systems  Constitutional:  Negative for fever.   HENT:  Negative for congestion, ear pain, sinus pain and sore throat.   Eyes:  Negative for pain.  Respiratory:  Negative for cough, shortness of breath and wheezing.   Cardiovascular:  Negative for chest pain and palpitations.  Gastrointestinal:  Negative for blood in stool, constipation, diarrhea, nausea and vomiting.  Genitourinary:  Negative for dysuria, frequency and hematuria.  Skin:  Negative for rash.  Neurological:  Negative for headaches.  Psychiatric/Behavioral:  Negative for depression. The patient is not nervous/anxious.       Objective:    Physical Exam Constitutional:      General: She is not in acute distress.    Appearance: Normal appearance. She is not ill-appearing.  HENT:     Head: Normocephalic and atraumatic.     Right Ear: Tympanic membrane, ear canal and external ear normal.     Left Ear: Tympanic membrane, ear canal and external ear normal.  Eyes:     Extraocular Movements: Extraocular movements intact.     Pupils: Pupils are equal, round, and reactive to light.  Cardiovascular:     Rate and Rhythm: Normal rate and regular rhythm.     Pulses: Normal pulses.     Heart sounds: Normal heart sounds. No murmur heard.   No gallop.  Pulmonary:     Effort: Pulmonary effort is normal. No respiratory distress.     Breath sounds: Normal breath sounds. No wheezing, rhonchi or rales.  Abdominal:     General: Bowel sounds are normal. There is no distension.     Palpations: Abdomen is soft. There is no mass.  Tenderness: There is no abdominal tenderness. There is no guarding or rebound.     Hernia: No hernia is present.  Skin:    General: Skin is warm and dry.  Neurological:     Mental Status: She is alert and oriented to person, place, and time.  Psychiatric:        Behavior: Behavior normal.    BP 124/84 (BP Location: Right Arm, Patient Position: Sitting, Cuff Size: Normal)   Pulse 80   Temp 98.1 F (36.7 C) (Oral)   Resp 18   Ht 5' 3.5" (1.613 m)    Wt 144 lb 6.4 oz (65.5 kg)   SpO2 97%   BMI 25.18 kg/m  Wt Readings from Last 3 Encounters:  10/22/20 144 lb 9.6 oz (65.6 kg)  10/21/20 144 lb 6.4 oz (65.5 kg)  07/02/20 140 lb 3.2 oz (63.6 kg)    Diabetic Foot Exam - Simple   No data filed    Lab Results  Component Value Date   WBC 6.3 10/21/2020   HGB 13.2 10/21/2020   HCT 40.0 10/21/2020   PLT 277.0 10/21/2020   GLUCOSE 90 10/21/2020   CHOL 131 10/21/2020   TRIG 52.0 10/21/2020   HDL 76.70 10/21/2020   LDLDIRECT 188.0 08/08/2012   LDLCALC 44 10/21/2020   ALT 26 10/21/2020   AST 25 10/21/2020   NA 139 10/21/2020   K 3.8 10/21/2020   CL 103 10/21/2020   CREATININE 0.82 10/21/2020   BUN 14 10/21/2020   CO2 27 10/21/2020   TSH 1.302 05/01/2020   INR 1.0 05/01/2020   HGBA1C 5.7 (H) 05/02/2020    Lab Results  Component Value Date   TSH 1.302 05/01/2020   Lab Results  Component Value Date   WBC 6.3 10/21/2020   HGB 13.2 10/21/2020   HCT 40.0 10/21/2020   MCV 94.1 10/21/2020   PLT 277.0 10/21/2020   Lab Results  Component Value Date   NA 139 10/21/2020   K 3.8 10/21/2020   CO2 27 10/21/2020   GLUCOSE 90 10/21/2020   BUN 14 10/21/2020   CREATININE 0.82 10/21/2020   BILITOT 0.6 10/21/2020   ALKPHOS 87 10/21/2020   AST 25 10/21/2020   ALT 26 10/21/2020   PROT 6.8 10/21/2020   ALBUMIN 4.3 10/21/2020   CALCIUM 10.0 10/21/2020   ANIONGAP 11 05/02/2020   GFR 69.97 10/21/2020   Lab Results  Component Value Date   CHOL 131 10/21/2020   Lab Results  Component Value Date   HDL 76.70 10/21/2020   Lab Results  Component Value Date   LDLCALC 44 10/21/2020   Lab Results  Component Value Date   TRIG 52.0 10/21/2020   Lab Results  Component Value Date   CHOLHDL 2 10/21/2020   Lab Results  Component Value Date   HGBA1C 5.7 (H) 05/02/2020   Dexa- Last completed 06/29/2019. Results normal. Repeat in 2 years.  Colonoscopy- Last completed 06/22/2020. Results normal. Repeat in 5 years.       Assessment & Plan:   Problem List Items Addressed This Visit       Unprioritized   Hyperlipidemia    Tolerating statin, encouraged heart healthy diet, avoid trans fats, minimize simple carbs and saturated fats. Increase exercise as tolerated       Relevant Orders   Lipid panel (Completed)   CBC with Differential/Platelet (Completed)   Comprehensive metabolic panel (Completed)   Leg cramps   Relevant Orders   Phosphorus (Completed)   Magnesium (Completed)  Osteoporosis    con't ca and vita d Add weight bearing exercise       Preventative health care - Primary    ghm utd Check labs  See avs       Relevant Orders   Lipid panel (Completed)   CBC with Differential/Platelet (Completed)   Comprehensive metabolic panel (Completed)   Transient global amnesia    Per neuro Improved          No orders of the defined types were placed in this encounter.   I, Dr. Roma Schanz, DO, personally preformed the services described in this documentation.  All medical record entries made by the scribe were at my direction and in my presence.  I have reviewed the chart and discharge instructions (if applicable) and agree that the record reflects my personal performance and is accurate and complete. 10/21/2020   I,Kim Mclaughlin,acting as a scribe for [employer], DO.,have documented all relevant documentation on the behalf of Ann Held, DO,as directed by  Ann Held, DO while in the presence of Ann Held, DO.   Ann Held, DO

## 2020-10-21 NOTE — Patient Instructions (Signed)
Preventive Care 75 Years and Older, Female Preventive care refers to lifestyle choices and visits with your health care provider that can promote health and wellness. This includes: A yearly physical exam. This is also called an annual wellness visit. Regular dental and eye exams. Immunizations. Screening for certain conditions. Healthy lifestyle choices, such as: Eating a healthy diet. Getting regular exercise. Not using drugs or products that contain nicotine and tobacco. Limiting alcohol use. What can I expect for my preventive care visit? Physical exam Your health care provider will check your: Height and weight. These may be used to calculate your BMI (body mass index). BMI is a measurement that tells if you are at a healthy weight. Heart rate and blood pressure. Body temperature. Skin for abnormal spots. Counseling Your health care provider may ask you questions about your: Past medical problems. Family's medical history. Alcohol, tobacco, and drug use. Emotional well-being. Home life and relationship well-being. Sexual activity. Diet, exercise, and sleep habits. History of falls. Memory and ability to understand (cognition). Work and work Statistician. Pregnancy and menstrual history. Access to firearms. What immunizations do I need?  Vaccines are usually given at various ages, according to a schedule. Your health care provider will recommend vaccines for you based on your age, medicalhistory, and lifestyle or other factors, such as travel or where you work. What tests do I need? Blood tests Lipid and cholesterol levels. These may be checked every 5 years, or more often depending on your overall health. Hepatitis C test. Hepatitis B test. Screening Lung cancer screening. You may have this screening every year starting at age 75 if you have a 30-pack-year history of smoking and currently smoke or have quit within the past 15 years. Colorectal cancer screening. All  adults should have this screening starting at age 75 and continuing until age 67. Your health care provider may recommend screening at age 75 if you are at increased risk. You will have tests every 1-10 years, depending on your results and the type of screening test. Diabetes screening. This is done by checking your blood sugar (glucose) after you have not eaten for a while (fasting). You may have this done every 1-3 years. Mammogram. This may be done every 1-2 years. Talk with your health care provider about how often you should have regular mammograms. Abdominal aortic aneurysm (AAA) screening. You may need this if you are a current or former smoker. BRCA-related cancer screening. This may be done if you have a family history of breast, ovarian, tubal, or peritoneal cancers. Other tests STD (sexually transmitted disease) testing, if you are at risk. Bone density scan. This is done to screen for osteoporosis. You may have this done starting at age 75. Talk with your health care provider about your test results, treatment options,and if necessary, the need for more tests. Follow these instructions at home: Eating and drinking  Eat a diet that includes fresh fruits and vegetables, whole grains, lean protein, and low-fat dairy products. Limit your intake of foods with high amounts of sugar, saturated fats, and salt. Take vitamin and mineral supplements as recommended by your health care provider. Do not drink alcohol if your health care provider tells you not to drink. If you drink alcohol: Limit how much you have to 0-1 drink a day. Be aware of how much alcohol is in your drink. In the [place] one drink equals one 12 oz bottle of beer (355 mL), one 5 oz glass of wine (148 mL), or one 1  oz glass of hard liquor (44 mL).  Lifestyle Take daily care of your teeth and gums. Brush your teeth every morning and night with fluoride toothpaste. Floss one time each day. Stay active. Exercise for at  least 30 minutes 5 or more days each week. Do not use any products that contain nicotine or tobacco, such as cigarettes, e-cigarettes, and chewing tobacco. If you need help quitting, ask your health care provider. Do not use drugs. If you are sexually active, practice safe sex. Use a condom or other form of protection in order to prevent STIs (sexually transmitted infections). Talk with your health care provider about taking a low-dose aspirin or statin. Find healthy ways to cope with stress, such as: Meditation, yoga, or listening to music. Journaling. Talking to a trusted person. Spending time with friends and family. Safety Always wear your seat belt while driving or riding in a vehicle. Do not drive: If you have been drinking alcohol. Do not ride with someone who has been drinking. When you are tired or distracted. While texting. Wear a helmet and other protective equipment during sports activities. If you have firearms in your house, make sure you follow all gun safety procedures. What's next? Visit your health care provider once a year for an annual wellness visit. Ask your health care provider how often you should have your eyes and teeth checked. Stay up to date on all vaccines. This information is not intended to replace advice given to you by your health care provider. Make sure you discuss any questions you have with your healthcare provider. Document Revised: 04/02/2020 Document Reviewed: 04/06/2018 Elsevier Patient Education  2022 [employer].

## 2020-10-21 NOTE — Assessment & Plan Note (Signed)
Tolerating statin, encouraged heart healthy diet, avoid trans fats, minimize simple carbs and saturated fats. Increase exercise as tolerated 

## 2020-10-22 ENCOUNTER — Ambulatory Visit (INDEPENDENT_AMBULATORY_CARE_PROVIDER_SITE_OTHER): Payer: Medicare PPO | Admitting: Neurology

## 2020-10-22 ENCOUNTER — Encounter: Payer: Self-pay | Admitting: Neurology

## 2020-10-22 VITALS — BP 126/66 | HR 80 | Ht 63.5 in | Wt 144.6 lb

## 2020-10-22 DIAGNOSIS — G454 Transient global amnesia: Secondary | ICD-10-CM | POA: Diagnosis not present

## 2020-10-22 DIAGNOSIS — R413 Other amnesia: Secondary | ICD-10-CM | POA: Diagnosis not present

## 2020-10-22 NOTE — Patient Instructions (Signed)
I had a long discussion the patient regarding her episode of transient global amnesia discussed risk for recurrence and answered questions.  I encouraged her to increase participation in cognitively challenging activities like solving crossword puzzles, playing bridge and sodoku.  No routine scheduled follow-up appointment with me is necessary but she may return back as needed only.

## 2020-10-22 NOTE — Progress Notes (Signed)
[name] [address] [place]. [place] [address] [phone]       OFFICE FOLLOW UP VISIT NOTE  [name] Date of Birth:  05-12-1945 Medical Record Number:  [id number]   Referring MD: [name]  Reason for Referral: Memory loss HPI: Initial visit 07/02/2020 :[name] is a pleasant 75 Caucasian lady seen today for initial office consultation visit for episode of memory loss.  History is obtained from the patient and her husband as well as review of electronic medical records and I personally reviewed pertinent imaging films in PACS.  She has past medical history for hyperlipidemia, gastroesophageal reflux disease and DVT.  She presented on 05/01/2020 with an episode of sudden onset of confusion and short-term memory difficulties.  She woke up that day from his usual state of health and drove to her appointment.  After that she apparently went to [place] to run some errands but around 2 PM she got a phone call from her sister who noted that the patient appeared to be confused she could not remember that her older sister was sick.  She also said that she was wandering around and could not find her car.  Patient's husband subsequently came there and noticed that the patient had poor short-term memory and kept on asking the same questions over again.  She did report a mild headache.  She was taken to hospital with she still does not remember the details of the work-up.  CT scan was unremarkable and CT angiogram showed 2 to 3 mm dilatation of supraclinoid ICAs bilaterally possible infundibulum was a small aneurysm.  MRI scan was negative for any acute abnormalities.  EEG was obtained which was normal without any seizure activity.  Patient's condition improved after she was hospitalized in she started remembering new information since hospitalization but still could not remember for 2 hours.  That afternoon.  Patient states she has done well since  then.  She said no problems making new memories after discharge but still has very patchy memory of that afternoon to our.  She has no prior history of seizures, significant head injury, loss of consciousness, strokes, TIA.  She denies being under significant stress for this.  She did see Dr. Marcello Moores neurosurgeon as an outpatient who recommended conservative follow-up for her 2 to 3 mm supraclinoid Immuzim versus infundibulum.  I do not have those records for my review today. Update 10/22/2020 : She returns for follow-up after last visit 3 months ago.  She is accompanied by her husband.  She states she is doing well and has not had any further episodes of memory loss or confusion.  She had EEG done on 07/31/2020 which was normal.  She remains on aspirin is tolerating well without bruising or bleeding and Lipitor which she is tolerating without muscle aches and pains.  Primary care physician recently added co-Q10 to reduce Mclaughlin effects.  Patient is fully independent in activities of daily living.  She has had no new health concerns.  She has no neurological complaints. ROS:   14 system review of systems is positive for memory loss, confusion, disorientation and all other systems negative  PMH:  Past Medical History:  Diagnosis Date   Allergy    Arthritis 07/2014   Right hand, prescribed prednisone-no longer taking. also Rt knww   DVT (deep venous thrombosis) ([place]) 2015   very small; treated with compression and heat, no other problems since   GERD (gastroesophageal reflux disease)  patient denies states she does have hx of peptic ulcer many years   Hepatitis    in 10 th grade  (not sure type- not hep C) - treated  no other problems   Hyperlipidemia    diet controlled   MVP (mitral valve prolapse)    no problems, MD told patient she could not hear it   Osteopenia    [facility]   PONV (postoperative nausea and vomiting)    with ear surgery only - No other problems with other surgeries   Precancerous  lesion 12/2014   Treated with cream only   Varicose veins     Social History:  Social History   Socioeconomic History   Marital status: Married    Spouse name: Kim Mclaughlin   Number of children: Not on file   Years of education: Not on file   Highest education level: Not on file  Occupational History   Occupation: retired  Tobacco Use   Smoking status: Former    Pack years: 0.00    Types: Cigarettes    Quit date: 04/27/1963    Years since quitting: 57.5   Smokeless tobacco: Never   Tobacco comments:    Smoked < 6 mos;< 1 pack total;quit 1965  Vaping Use   Vaping Use: Never used  Substance and Sexual Activity   Alcohol use: No   Drug use: No   Sexual activity: Yes    Birth control/protection: Post-menopausal  Other Topics Concern   Not on file  Social History Narrative   Lives with husband   Right Handed   Drinks average 2-3 cups caffeine daily   Social Determinants of Health   Financial Resource Strain: Low Risk    Difficulty of Paying Living Expenses: Not hard at all  Food Insecurity: No Food Insecurity   Worried About Charity fundraiser in the Last Year: Never true   [employer] in the Last Year: Never true  Transportation Needs: No Transportation Needs   Lack of Transportation (Medical): No   Lack of Transportation (Non-Medical): No  Physical Activity: Not on file  Stress: Not on file  Social Connections: Not on file  Intimate Partner Violence: Not on file    Medications:   Current Outpatient Medications on File Prior to Visit  Medication Sig Dispense Refill   Ascorbic Acid (VITAMIN C) 1000 MG tablet Take 1,000 mg by mouth daily.     aspirin EC 81 MG tablet Take 81 mg by mouth daily.     atorvastatin (LIPITOR) 80 MG tablet TAKE 1 TABLET BY MOUTH EVERY DAY 30 tablet 0   Bioflavonoid Products (QUERCETIN COMPLEX IMMUNE PO) Take by mouth.     cetirizine (ZYRTEC) 10 MG tablet Take 10 mg by mouth daily as needed for allergies.      cholecalciferol (VITAMIN D)  1000 units tablet Take 1,000 Units by mouth daily.     co-enzyme Q-10 30 MG capsule Take 30 mg by mouth 3 (three) times daily.     Glucosamine HCl-MSM (MSM GLUCOSAMINE PO) Take 2 tablets by mouth daily.      lidocaine (LIDODERM) 5 % Place 1 patch onto the skin daily. Remove & Discard patch within 12 hours or as directed by MD (Patient taking differently: Place 1 patch onto the skin daily.) 30 patch 0   Multiple Vitamins-Minerals (CENTRUM SILVER PO) Take 1 tablet by mouth daily.     Omega-3 Fatty Acids (FISH OIL) 1000 MG CAPS Take 1 capsule by mouth daily.  OVER THE COUNTER MEDICATION Take 1 tablet by mouth 3 (three) times daily. Bone strength     triamcinolone (NASACORT) 55 MCG/ACT AERO nasal inhaler Place 2 sprays into the nose daily as needed (allergies).      triamcinolone cream (KENALOG) 0.1 % Apply 1 application topically 2 (two) times daily as needed (eczema.).      zinc gluconate 50 MG tablet Take 50 mg by mouth daily.     ELDERBERRY PO Take 2 lozenges by mouth daily.      No current facility-administered medications on file prior to visit.    Allergies:   Allergies  Allergen Reactions   Macrobid  [Nitrofurantoin Macrocrystal] Other (See Comments)   Doxycycline Hives   Pravastatin Sodium     REACTION: shin pain   Amoxicillin-Pot Clavulanate     GI symptoms; abdominal pain & nausea NOTE: able to take Ampicillin   Augmentin [Amoxicillin-Pot Clavulanate] Nausea Only    Upsets stomach   Codeine Nausea Only   Methylphenidate Hcl Palpitations   Methylprednisolone Rash    Rash and redness on face   Sulfamethoxazole-Trimethoprim Nausea Only     nausea    Physical Exam General: Frail elderly Caucasian lady seated, in no evident distress Head: head normocephalic and atraumatic.   Neck: supple with no carotid or supraclavicular bruits Cardiovascular: regular rate and rhythm, no murmurs Musculoskeletal: no deformity Skin:  no rash/petichiae Vascular:  Normal pulses all  extremities  Neurologic Exam Mental Status: Awake and fully alert. Oriented to place and time. Recent and remote memory intact. Attention span, concentration and fund of knowledge appropriate. Mood and affect appropriate.  Recall 3/3.  Able to name 15 animals that can walk on 4 legs.  Clock drawing 4/4. Cranial Nerves: Fundoscopic exam not done. Pupils equal, briskly reactive to light. Extraocular movements full without nystagmus. Visual fields full to confrontation. Hearing intact. Facial sensation intact. Face, tongue, palate moves normally and symmetrically.  Motor: Normal bulk and tone. Normal strength in all tested extremity muscles. Sensory.: intact to touch , pinprick , position and vibratory sensation.  Coordination: Rapid alternating movements normal in all extremities. Finger-to-nose and heel-to-shin performed accurately bilaterally. Gait and Station: Arises from chair without difficulty. Stance is normal. Gait demonstrates normal stride length and balance . Able to heel, toe and tandem walk with mild difficulty.  Reflexes: 1+ and symmetric. Toes downgoing.       ASSESSMENT: 75 year old Caucasian lady with transient episode of memory loss and confusion likely due to transient global amnesia.  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     PLAN: I had a long discussion the patient regarding her episode of transient global amnesia discussed risk for recurrence and answered questions.  I encouraged her to increase participation in cognitively challenging activities like solving crossword puzzles, playing bridge and sodoku.  Continue conservative follow-up for her tiny 2 to 3 mm bilateral supraclinoid aneurysms versus infundibulum.  No routine scheduled follow-up appointment with me is necessary but she may return back as needed only.Greater than 50% time during this 25-minute  visit was spent on counseling and  coordination of care about her episode of memory loss and asymptomatic aneurysm versus infundibulum discussion Antony Contras, MD Note: This document was prepared with digital dictation and possible smart phrase technology. Any transcriptional errors that result from this process are unintentional.

## 2020-10-27 DIAGNOSIS — Z0000 Encounter for general adult medical examination without abnormal findings: Secondary | ICD-10-CM | POA: Insufficient documentation

## 2020-10-27 DIAGNOSIS — R252 Cramp and spasm: Secondary | ICD-10-CM | POA: Insufficient documentation

## 2020-10-27 NOTE — Assessment & Plan Note (Addendum)
con't ca and vita d Add weight bearing exercise

## 2020-10-27 NOTE — Assessment & Plan Note (Signed)
ghm utd Check labs  See avs  

## 2020-10-27 NOTE — Assessment & Plan Note (Signed)
Per neuro Improved

## 2020-10-28 ENCOUNTER — Other Ambulatory Visit: Payer: Self-pay | Admitting: Family Medicine

## 2020-11-14 ENCOUNTER — Ambulatory Visit (INDEPENDENT_AMBULATORY_CARE_PROVIDER_SITE_OTHER): Payer: Medicare PPO | Admitting: Gastroenterology

## 2020-11-14 ENCOUNTER — Other Ambulatory Visit (INDEPENDENT_AMBULATORY_CARE_PROVIDER_SITE_OTHER): Payer: Medicare PPO

## 2020-11-14 ENCOUNTER — Encounter: Payer: Self-pay | Admitting: Gastroenterology

## 2020-11-14 VITALS — BP 150/76 | HR 78 | Ht 63.5 in | Wt 145.0 lb

## 2020-11-14 DIAGNOSIS — R195 Other fecal abnormalities: Secondary | ICD-10-CM

## 2020-11-14 DIAGNOSIS — R194 Change in bowel habit: Secondary | ICD-10-CM | POA: Diagnosis not present

## 2020-11-14 LAB — CBC WITH DIFFERENTIAL/PLATELET
Basophils Absolute: 0.1 10*3/uL (ref 0.0–0.1)
Basophils Relative: 1.3 % (ref 0.0–3.0)
Eosinophils Absolute: 0.3 10*3/uL (ref 0.0–0.7)
Eosinophils Relative: 4.1 % (ref 0.0–5.0)
HCT: 42.5 % (ref 36.0–46.0)
Hemoglobin: 13.8 g/dL (ref 12.0–15.0)
Lymphocytes Relative: 25.2 % (ref 12.0–46.0)
Lymphs Abs: 1.6 10*3/uL (ref 0.7–4.0)
MCHC: 32.5 g/dL (ref 30.0–36.0)
MCV: 94.3 fl (ref 78.0–100.0)
Monocytes Absolute: 0.6 10*3/uL (ref 0.1–1.0)
Monocytes Relative: 8.8 % (ref 3.0–12.0)
Neutro Abs: 3.9 10*3/uL (ref 1.4–7.7)
Neutrophils Relative %: 60.6 % (ref 43.0–77.0)
Platelets: 283 10*3/uL (ref 150.0–400.0)
RBC: 4.51 Mil/uL (ref 3.87–5.11)
RDW: 13.1 % (ref 11.5–15.5)
WBC: 6.4 10*3/uL (ref 4.0–10.5)

## 2020-11-14 MED ORDER — PLENVU 140 G PO SOLR: 1.0000 | ORAL | 0 refills | Status: DC

## 2020-11-14 NOTE — Progress Notes (Signed)
Kim Mclaughlin [facility] Progress Note  Chief Complaint: Change in bowel habit  Subjective  History: Kim Mclaughlin came to see me with concerns about a change in bowel habits that is been going on for about 6 months to a year.  I last saw her for a colonoscopy in February 2020, which was a normal study. She has a bowel movement relatively early in the morning, and it is of normal character.  Then she has a few more BMs throughout the morning and early afternoon, where the stools get thinner or small and she feels as if she is not completely evacuated.  She may have a small pellet of stool that might even pass when she sits to urinate.  The symptoms have been concerning to her because a friend was diagnosed with rectal cancer with symptoms that were originally thought to be hemorrhoidal. She does not have urinary frequency or difficulty passing urine. No rectal bleeding. No abdominal pain, appetite is good and weight stable.  ROS: Cardiovascular:  no chest pain Respiratory: no dyspnea No urinary symptoms No pain or bleeding Remainder of systems negative except as above The patient's Past Medical, Family and Social History were reviewed and are on file in the EMR.  Objective:  Med list reviewed  Current Outpatient Medications:    Ascorbic Acid (VITAMIN C) 1000 MG tablet, Take 1,000 mg by mouth daily., Disp: , Rfl:    aspirin EC 81 MG tablet, Take 81 mg by mouth daily., Disp: , Rfl:    atorvastatin (LIPITOR) 80 MG tablet, TAKE 1 TABLET BY MOUTH EVERY DAY, Disp: 90 tablet, Rfl: 1   Bioflavonoid Products (QUERCETIN COMPLEX IMMUNE PO), Take 1 tablet by mouth in the morning, at noon, and at bedtime., Disp: , Rfl:    Boswellia-Glucosamine-Vit D (OSTEO BI-FLEX ONE PER DAY PO), Take 1 tablet by mouth 2 (two) times daily., Disp: , Rfl:    cetirizine (ZYRTEC) 10 MG tablet, Take 10 mg by mouth daily as needed for allergies. , Disp: , Rfl:    Cholecalciferol (VITAMIN D-3) 25 MCG (1000 UT) CAPS, Take 1  capsule by mouth 2 (two) times daily., Disp: , Rfl:    Glucosamine HCl-MSM (MSM GLUCOSAMINE PO), Take 2 tablets by mouth daily. , Disp: , Rfl:    lidocaine (LIDODERM) 5 %, Place 1 patch onto the skin daily. Remove & Discard patch within 12 hours or as directed by MD (Patient taking differently: Place 1 patch onto the skin daily.), Disp: 30 patch, Rfl: 0   Multiple Vitamins-Minerals (CENTRUM SILVER PO), Take 1 tablet by mouth daily., Disp: , Rfl:    Omega-3 Fatty Acids (FISH OIL) 1000 MG CAPS, Take 1 capsule by mouth daily. , Disp: , Rfl:    OVER THE COUNTER MEDICATION, Take 1 tablet by mouth 3 (three) times daily. Bone strength, Disp: , Rfl:    PEG-KCl-NaCl-NaSulf-Na Asc-C (PLENVU) 140 g SOLR, Take 1 kit by mouth as directed. Use coupon: BIN: [id number] PNC: CNRX Group: [id number] ID: [id number], Disp: 1 each, Rfl: 0   QUERCETIN PO, Take 1 tablet by mouth daily., Disp: , Rfl:    triamcinolone (NASACORT) 55 MCG/ACT AERO nasal inhaler, Place 2 sprays into the nose daily as needed (allergies). , Disp: , Rfl:    triamcinolone cream (KENALOG) 0.1 %, Apply 1 application topically 2 (two) times daily as needed (eczema.). , Disp: , Rfl:    Ubiquinol (QUNOL COQ10/UBIQUINOL/MEGA) 100 MG CAPS, Take 1 capsule by mouth 2 (two) times daily., Disp: , Rfl:  Zinc Gluconate 30 MG TABS, Take 30 mg by mouth daily., Disp: , Rfl:    Vital signs in last 24 hrs: Vitals:   11/14/20 0929  BP: (!) 150/76  Pulse: 78  SpO2: 98%   Wt Readings from Last 3 Encounters:  11/14/20 145 lb (65.8 kg)  10/22/20 144 lb 9.6 oz (65.6 kg)  10/21/20 144 lb 6.4 oz (65.5 kg)    Physical Exam Exam chaperoned by Dixon Boos, CMA Well-appearing HEENT: sclera anicteric, oral mucosa moist without lesions Neck: supple, no thyromegaly, JVD or lymphadenopathy Cardiac: RRR without murmurs, S1S2 heard, no peripheral edema Pulm: clear to auscultation bilaterally, normal RR and effort noted Abdomen: soft, no tenderness, with active bowel  sounds. No guarding or palpable hepatosplenomegaly. Skin; warm and dry, no jaundice or rash Heme pos - no mass.  Possible rectocele, with prominent rectal descent while bearing down  Labs:  CBC Latest Ref Rng & Units 11/14/2020 10/21/2020 05/01/2020  WBC 4.0 - 10.5 K/uL 6.4 6.3 7.5  Hemoglobin 12.0 - 15.0 g/dL 13.8 13.2 14.8  Hematocrit 36.0 - 46.0 % 42.5 40.0 44.5  Platelets 150.0 - 400.0 K/uL 283.0 277.0 361    ___________________________________________ Radiologic studies:   ____________________________________________ Other:   _____________________________________________ Assessment & Plan  Assessment: Encounter Diagnoses  Name Primary?   Change in bowel habits Yes   Heme positive stool    Change in bowel habits without clear trigger.  She does not seem to have urinary symptoms of pelvic floor dysfunction, and she has not noticed any cervical/uterine prolapse. However, the symptoms may be at least in part due to anatomic changes with descent of pelvic structures. Heme positive, she has multiple BMs per day and had just gone to the bathroom before the rectal exam, so hopefully that is just from anorectal irritation.  Nevertheless, neoplasia must be ruled out with that and the change in bowel habits.    Plan: Patient was sent for a CBC after the clinic visit, result normal as above.  Tablespoon of Benefiber and glass of water daily to bulk stool and hopefully give more complete evacuation with first or second BM.  Colonoscopy.  She was agreeable after discussion of procedure and risks  The benefits and risks of the planned procedure were described in detail with the patient or (when appropriate) their health care proxy.  Risks were outlined as including, but not limited to, bleeding, infection, perforation, adverse medication reaction leading to cardiac or pulmonary decompensation, pancreatitis (if ERCP).  The limitation of incomplete mucosal visualization was also discussed.   No guarantees or warranties were given.  If unrevealing, see gynecologist to evaluate for possible rectocele.  Kim Mclaughlin told me she has an appointment to see them in November of this year, but would request sooner if colonoscopy unrevealing.   Kim Mclaughlin

## 2020-11-14 NOTE — Patient Instructions (Signed)
You have been scheduled for a colonoscopy. Please follow written instructions given to you at your visit today.  Please pick up your prep supplies at the pharmacy within the next 1-3 days. If you use inhalers (even only as needed), please bring them with you on the day of your procedure.  Your provider has requested that you go to the basement level for lab work before leaving today. Press "B" on the elevator. The lab is located at the first door on the left as you exit the elevator.  Please purchase the following medications over the counter and take as directed: Benefiber 1 tablespoon dissolved in water/juice once daily  If you are age 88 or older, your body mass index should be between 23-30. Your Body mass index is 25.28 kg/m. If this is out of the aforementioned range listed, please consider follow up with your Primary Care Provider. __________________________________________________________  The [facility] providers would like to encourage you to use [facility] to communicate with providers for non-urgent requests or questions.  Due to long hold times on the telephone, sending your provider a message by [facility] may be a faster and more efficient way to get a response.  Please allow 48 business hours for a response.  Please remember that this is for non-urgent requests.   Due to recent changes in healthcare laws, you may see the results of your imaging and laboratory studies on MyChart before your provider has had a chance to review them.  We understand that in some cases there may be results that are confusing or concerning to you. Not all laboratory results come back in the same time frame and the provider may be waiting for multiple results in order to interpret others.  Please give [place] 48 hours in order for your provider to thoroughly review all the results before contacting the office for clarification of your results.

## 2020-11-24 ENCOUNTER — Other Ambulatory Visit: Payer: Self-pay

## 2020-11-24 ENCOUNTER — Encounter: Payer: Self-pay | Admitting: Gastroenterology

## 2020-11-24 ENCOUNTER — Ambulatory Visit (AMBULATORY_SURGERY_CENTER): Payer: Medicare PPO | Admitting: Gastroenterology

## 2020-11-24 VITALS — BP 122/59 | HR 75 | Temp 97.3°F | Resp 11 | Ht 63.0 in | Wt 145.0 lb

## 2020-11-24 DIAGNOSIS — R194 Change in bowel habit: Secondary | ICD-10-CM

## 2020-11-24 DIAGNOSIS — R195 Other fecal abnormalities: Secondary | ICD-10-CM | POA: Diagnosis not present

## 2020-11-24 MED ORDER — SODIUM CHLORIDE 0.9 % IV SOLN: 500.0000 mL | Freq: Once | INTRAVENOUS | Status: DC

## 2020-11-24 NOTE — Progress Notes (Signed)
Check-in-JB  V/S-CW

## 2020-11-24 NOTE — Patient Instructions (Signed)
Continue your normal medications- use Benefiber 1 tablespoon every evening  See gynecologist to evaluate for possible rectocele  No further colonoscopies recommended due to age, but please call Dr. Loletha Carrow' office if you have any issues   YOU HAD AN ENDOSCOPIC PROCEDURE TODAY AT [place] [facility]:   Refer to the procedure report that was given to you for any specific questions about what was found during the examination.  If the procedure report does not answer your questions, please call your gastroenterologist to clarify.  If you requested that your care partner not be given the details of your procedure findings, then the procedure report has been included in a sealed envelope for you to review at your convenience later.  YOU SHOULD EXPECT: Some feelings of bloating in the abdomen. Passage of more gas than usual.  Walking can help get rid of the air that was put into your GI tract during the procedure and reduce the bloating. If you had a lower endoscopy (such as a colonoscopy or flexible sigmoidoscopy) you may notice spotting of blood in your stool or on the toilet paper. If you underwent a bowel prep for your procedure, you may not have a normal bowel movement for a few days.  Please Note:  You might notice some irritation and congestion in your nose or some drainage.  This is from the oxygen used during your procedure.  There is no need for concern and it should clear up in a day or so.  SYMPTOMS TO REPORT IMMEDIATELY:  Following lower endoscopy (colonoscopy or flexible sigmoidoscopy):  Excessive amounts of blood in the stool  Significant tenderness or worsening of abdominal pains  Swelling of the abdomen that is new, acute  Fever of 100F or higher For urgent or emergent issues, a gastroenterologist can be reached at any hour by calling [phone]. Do not use MyChart messaging for urgent concerns.    DIET:  We do recommend a small meal at first, but then you may proceed to  your regular diet.  Drink plenty of fluids but you should avoid alcoholic beverages for 24 hours.  ACTIVITY:  You should plan to take it easy for the rest of today and you should NOT DRIVE or use heavy machinery until tomorrow (because of the sedation medicines used during the test).    FOLLOW UP: Our staff will call the number listed on your records 48-72 hours following your procedure to check on you and address any questions or concerns that you may have regarding the information given to you following your procedure. If we do not reach you, we will leave a message.  We will attempt to reach you two times.  During this call, we will ask if you have developed any symptoms of COVID 19. If you develop any symptoms (ie: fever, flu-like symptoms, shortness of breath, cough etc.) before then, please call [phone].  If you test positive for Covid 19 in the 2 weeks post procedure, please call and report this information to [place].     SIGNATURES/CONFIDENTIALITY: You and/or your care partner have signed paperwork which will be entered into your electronic medical record.  These signatures attest to the fact that that the information above on your After Visit Summary has been reviewed and is understood.  Full responsibility of the confidentiality of this discharge information lies with you and/or your care-partner.

## 2020-11-24 NOTE — Progress Notes (Signed)
pt tolerated well. VSS. awake and to recovery. Report given to RN.  

## 2020-11-24 NOTE — Op Note (Signed)
[facility] Patient Name: Kim Mclaughlin Procedure Date: 11/24/2020 8:30 AM MRN: [id number] Endoscopist: Mallie Mussel L. Loletha Carrow , MD Age: 75 Referring MD:  Date of Birth: Sep 22, 1945 Gender: Female Account #: [id number] Procedure:                Colonoscopy Indications:              Heme positive stool (normal Hgb), Change in bowel                            habits                           see recent office note for clinical details Medicines:                Monitored Anesthesia Care Procedure:                Pre-Anesthesia Assessment:                           - Prior to the procedure, a History and Physical                            was performed, and patient medications and                            allergies were reviewed. The patient's tolerance of                            previous anesthesia was also reviewed. The risks                            and benefits of the procedure and the sedation                            options and risks were discussed with the patient.                            All questions were answered, and informed consent                            was obtained. Prior Anticoagulants: The patient has                            taken no previous anticoagulant or antiplatelet                            agents. ASA Grade Assessment: III - A patient with                            severe systemic disease. After reviewing the risks                            and benefits, the patient was deemed in  satisfactory condition to undergo the procedure.                           After obtaining informed consent, the colonoscope                            was passed under direct vision. Throughout the                            procedure, the patient's blood pressure, pulse, and                            oxygen saturations were monitored continuously. The                            Olympus PCF-H190DL [id number]) Colonoscope was                             introduced through the anus and advanced to the the                            cecum, identified by appendiceal orifice and                            ileocecal valve. The colonoscopy was somewhat                            difficult due to a redundant colon. Successful                            completion of the procedure was aided by changing                            the patient to a prone position and using manual                            pressure. The patient tolerated the procedure well.                            The quality of the bowel preparation was excellent.                            The ileocecal valve, appendiceal orifice, and                            rectum were photographed. Scope In: 8:39:25 AM Scope Out: 8:55:40 AM Scope Withdrawal Time: 0 hours 8 minutes 46 seconds  Total Procedure Duration: 0 hours 16 minutes 15 seconds  Findings:                 The digital rectal exam findings include decreased                            sphincter tone.  A localized area of mildly friable mucosa was found                            in the distal rectum.                           The exam was otherwise without abnormality on                            direct and retroflexion views. Complications:            No immediate complications. Estimated Blood Loss:     Estimated blood loss: none. Impression:               - Decreased sphincter tone found on digital rectal                            exam.                           - Friability in the distal rectum. Mild finding -                            appearance most consistent with prep artifact or                            focal mucosal prolapse.                           - The examination was otherwise normal on direct                            and retroflexion views.                           - No specimens collected. Recommendation:           - Patient has a contact number  available for                            emergencies. The signs and symptoms of potential                            delayed complications were discussed with the                            patient. Return to normal activities tomorrow.                            Written discharge instructions were provided to the                            patient.                           - Resume previous diet.                           -  Continue present medications.                           - Use Benefiber one tablespoon PO each evening.                           - See gynecologist to evaluate for possible                            rectocele                           - No future routine screening colonoscopy                            recommended due to age, current guidelines and no                            polyps found on this exam. Chamara Dyck L. Loletha Carrow, MD 11/24/2020 9:03:47 AM This report has been signed electronically.

## 2020-11-24 NOTE — Progress Notes (Signed)
No changes to clinical history since recent office visit.

## 2020-11-26 ENCOUNTER — Telehealth: Payer: Self-pay

## 2020-11-26 NOTE — Telephone Encounter (Signed)
  Follow up Call-  Call back number 11/24/2020 06/22/2018  Post procedure Call Back phone  # [phone]  Permission to leave phone message Yes Yes  Some recent data might be hidden     Patient questions:  Do you have a fever, pain , or abdominal swelling? No. Pain Score  0 *  Have you tolerated food without any problems? Yes.    Have you been able to return to your normal activities? Yes.    Do you have any questions about your discharge instructions: Diet   No. Medications  No. Follow up visit  No.  Do you have questions or concerns about your Care? No.  Actions: * If pain score is 4 or above: No action needed, pain <4.  Have you developed a fever since your procedure? no  2.   Have you had an respiratory symptoms (SOB or cough) since your procedure? no  3.   Have you tested positive for COVID 19 since your procedure no  4.   Have you had any family members/close contacts diagnosed with the COVID 19 since your procedure?  no   If yes to any of these questions please route to Joylene John, RN and Joella Prince, RN

## 2020-12-11 DIAGNOSIS — K5900 Constipation, unspecified: Secondary | ICD-10-CM | POA: Diagnosis not present

## 2020-12-19 DIAGNOSIS — M25561 Pain in right knee: Secondary | ICD-10-CM | POA: Diagnosis not present

## 2021-02-04 DIAGNOSIS — D1801 Hemangioma of skin and subcutaneous tissue: Secondary | ICD-10-CM | POA: Diagnosis not present

## 2021-02-04 DIAGNOSIS — D225 Melanocytic nevi of trunk: Secondary | ICD-10-CM | POA: Diagnosis not present

## 2021-02-04 DIAGNOSIS — I8391 Asymptomatic varicose veins of right lower extremity: Secondary | ICD-10-CM | POA: Diagnosis not present

## 2021-02-04 DIAGNOSIS — L814 Other melanin hyperpigmentation: Secondary | ICD-10-CM | POA: Diagnosis not present

## 2021-02-04 DIAGNOSIS — L821 Other seborrheic keratosis: Secondary | ICD-10-CM | POA: Diagnosis not present

## 2021-02-25 ENCOUNTER — Other Ambulatory Visit: Payer: Self-pay

## 2021-02-25 ENCOUNTER — Ambulatory Visit (INDEPENDENT_AMBULATORY_CARE_PROVIDER_SITE_OTHER): Payer: Medicare PPO

## 2021-02-25 VITALS — BP 138/70 | HR 79 | Temp 98.3°F | Resp 16 | Ht 63.5 in | Wt 147.2 lb

## 2021-02-25 DIAGNOSIS — Z0000 Encounter for general adult medical examination without abnormal findings: Secondary | ICD-10-CM | POA: Diagnosis not present

## 2021-02-25 NOTE — Patient Instructions (Signed)
Kim Mclaughlin , Thank you for taking time to come for your Medicare Wellness Visit. I appreciate your ongoing commitment to your health goals. Please review the following plan we discussed and let me know if I can assist you in the future.   Screening recommendations/referrals: Colonoscopy: No longer required Mammogram: Scheduled for 03/02/2021 Bone Density: Completed 06/29/2019-Due 06/28/2021 Recommended yearly ophthalmology/optometry visit for glaucoma screening and checkup Recommended yearly dental visit for hygiene and checkup  Vaccinations: Influenza vaccine: Up to date Pneumococcal vaccine: Up to date Tdap vaccine: Up to date-Due-06/26/2028 Shingles vaccine: Completed vaccines   Covid-19:Declined  Advanced directives: Please bring a copy of Living Will and/or Healthcare Power of Attorney for your chart.   Conditions/risks identified: See problem list  Next appointment: Follow up in one year for your annual wellness visit    Preventive Care 65 Years and Older, Female Preventive care refers to lifestyle choices and visits with your health care provider that can promote health and wellness. What does preventive care include? A yearly physical exam. This is also called an annual well check. Dental exams once or twice a year. Routine eye exams. Ask your health care provider how often you should have your eyes checked. Personal lifestyle choices, including: Daily care of your teeth and gums. Regular physical activity. Eating a healthy diet. Avoiding tobacco and drug use. Limiting alcohol use. Practicing safe sex. Taking low-dose aspirin every day. Taking vitamin and mineral supplements as recommended by your health care provider. What happens during an annual well check? The services and screenings done by your health care provider during your annual well check will depend on your age, overall health, lifestyle risk factors, and family history of disease. Counseling  Your health  care provider may ask you questions about your: Alcohol use. Tobacco use. Drug use. Emotional well-being. Home and relationship well-being. Sexual activity. Eating habits. History of falls. Memory and ability to understand (cognition). Work and work Statistician. Reproductive health. Screening  You may have the following tests or measurements: Height, weight, and BMI. Blood pressure. Lipid and cholesterol levels. These may be checked every 5 years, or more frequently if you are over 41 years old. Skin check. Lung cancer screening. You may have this screening every year starting at age 54 if you have a 30-pack-year history of smoking and currently smoke or have quit within the past 15 years. Fecal occult blood test (FOBT) of the stool. You may have this test every year starting at age 66. Flexible sigmoidoscopy or colonoscopy. You may have a sigmoidoscopy every 5 years or a colonoscopy every 10 years starting at age 56. Hepatitis C blood test. Hepatitis B blood test. Sexually transmitted disease (STD) testing. Diabetes screening. This is done by checking your blood sugar (glucose) after you have not eaten for a while (fasting). You may have this done every 1-3 years. Bone density scan. This is done to screen for osteoporosis. You may have this done starting at age 64. Mammogram. This may be done every 1-2 years. Talk to your health care provider about how often you should have regular mammograms. Talk with your health care provider about your test results, treatment options, and if necessary, the need for more tests. Vaccines  Your health care provider may recommend certain vaccines, such as: Influenza vaccine. This is recommended every year. Tetanus, diphtheria, and acellular pertussis (Tdap, Td) vaccine. You may need a Td booster every 10 years. Zoster vaccine. You may need this after age 20. Pneumococcal 13-valent conjugate (PCV13) vaccine.  One dose is recommended after age  54. Pneumococcal polysaccharide (PPSV23) vaccine. One dose is recommended after age 80. Talk to your health care provider about which screenings and vaccines you need and how often you need them. This information is not intended to replace advice given to you by your health care provider. Make sure you discuss any questions you have with your health care provider. Document Released: 05/09/2015 Document Revised: 12/31/2015 Document Reviewed: 02/11/2015 Elsevier Interactive Patient Education  2017 [place] Prevention in the Home Falls can cause injuries. They can happen to people of all ages. There are many things you can do to make your home safe and to help prevent falls. What can I do on the outside of my home? Regularly fix the edges of walkways and driveways and fix any cracks. Remove anything that might make you trip as you walk through a door, such as a raised step or threshold. Trim any bushes or trees on the path to your home. Use bright outdoor lighting. Clear any walking paths of anything that might make someone trip, such as rocks or tools. Regularly check to see if handrails are loose or broken. Make sure that both sides of any steps have handrails. Any raised decks and porches should have guardrails on the edges. Have any leaves, snow, or ice cleared regularly. Use sand or salt on walking paths during winter. Clean up any spills in your garage right away. This includes oil or grease spills. What can I do in the bathroom? Use night lights. Install grab bars by the toilet and in the tub and shower. Do not use towel bars as grab bars. Use non-skid mats or decals in the tub or shower. If you need to sit down in the shower, use a plastic, non-slip stool. Keep the floor dry. Clean up any water that spills on the floor as soon as it happens. Remove soap buildup in the tub or shower regularly. Attach bath mats securely with double-sided non-slip rug tape. Do not have throw  rugs and other things on the floor that can make you trip. What can I do in the bedroom? Use night lights. Make sure that you have a light by your bed that is easy to reach. Do not use any sheets or blankets that are too big for your bed. They should not hang down onto the floor. Have a firm chair that has side arms. You can use this for support while you get dressed. Do not have throw rugs and other things on the floor that can make you trip. What can I do in the kitchen? Clean up any spills right away. Avoid walking on wet floors. Keep items that you use a lot in easy-to-reach places. If you need to reach something above you, use a strong step stool that has a grab bar. Keep electrical cords out of the way. Do not use floor polish or wax that makes floors slippery. If you must use wax, use non-skid floor wax. Do not have throw rugs and other things on the floor that can make you trip. What can I do with my stairs? Do not leave any items on the stairs. Make sure that there are handrails on both sides of the stairs and use them. Fix handrails that are broken or loose. Make sure that handrails are as long as the stairways. Check any carpeting to make sure that it is firmly attached to the stairs. Fix any carpet that is loose or  worn. Avoid having throw rugs at the top or bottom of the stairs. If you do have throw rugs, attach them to the floor with carpet tape. Make sure that you have a light switch at the top of the stairs and the bottom of the stairs. If you do not have them, ask someone to add them for you. What else can I do to help prevent falls? Wear shoes that: Do not have high heels. Have rubber bottoms. Are comfortable and fit you well. Are closed at the toe. Do not wear sandals. If you use a stepladder: Make sure that it is fully opened. Do not climb a closed stepladder. Make sure that both sides of the stepladder are locked into place. Ask someone to hold it for you, if  possible. Clearly mark and make sure that you can see: Any grab bars or handrails. First and last steps. Where the edge of each step is. Use tools that help you move around (mobility aids) if they are needed. These include: Canes. Walkers. Scooters. Crutches. Turn on the lights when you go into a dark area. Replace any light bulbs as soon as they burn out. Set up your furniture so you have a clear path. Avoid moving your furniture around. If any of your floors are uneven, fix them. If there are any pets around you, be aware of where they are. Review your medicines with your doctor. Some medicines can make you feel dizzy. This can increase your chance of falling. Ask your doctor what other things that you can do to help prevent falls. This information is not intended to replace advice given to you by your health care provider. Make sure you discuss any questions you have with your health care provider. Document Released: 02/06/2009 Document Revised: 09/18/2015 Document Reviewed: 05/17/2014 Elsevier Interactive Patient Education  2017 [employer].

## 2021-02-25 NOTE — Progress Notes (Signed)
Subjective:   Kim Mclaughlin is a 75 y.o. female who presents for Medicare Annual (Subsequent) preventive examination.  Review of Systems     Cardiac Risk Factors include: advanced age (>79men, >75 women);sedentary lifestyle;dyslipidemia     Objective:    Today's Vitals   02/25/21 0932  BP: 138/70  Pulse: 79  Resp: 16  Temp: 98.3 F (36.8 C)  SpO2: 96%  Weight: 147 lb 3.2 oz (66.8 kg)  Height: 5' 3.5" (1.613 m)   Body mass index is 25.67 kg/m.  Advanced Directives 02/25/2021 05/01/2020 12/28/2019 11/16/2018 11/07/2017 07/29/2016 04/22/2016  Does Patient Have a Medical Advance Directive? Yes Yes Yes No Yes Yes Yes  Type of Paramedic of [place];Living will [place];Living will Healthcare Power of [place];Living will [place];Living will [place];Living will  Does patient want to make changes to medical advance directive? - No - Patient declined No - Patient declined - - - No - Patient declined  Copy of [place] in Chart? No - copy requested No - copy requested Yes - validated most recent copy scanned in chart (See row information) - Yes No - copy requested Yes  Would patient like information on creating a medical advance directive? - - - Yes (ED - Information included in AVS) - - -    Current Medications (verified) Outpatient Encounter Medications as of 02/25/2021  Medication Sig   Ascorbic Acid (VITAMIN C) 1000 MG tablet Take 1,000 mg by mouth daily.   atorvastatin (LIPITOR) 80 MG tablet TAKE 1 TABLET BY MOUTH EVERY DAY   Bioflavonoid Products (QUERCETIN COMPLEX IMMUNE PO) Take 1 tablet by mouth in the morning, at noon, and at bedtime.   Boswellia-Glucosamine-Vit D (OSTEO BI-FLEX ONE PER DAY PO) Take 1 tablet by mouth 2 (two) times daily.   Cholecalciferol (VITAMIN D-3) 25 MCG (1000 UT) CAPS Take 1 capsule by mouth 2 (two) times daily.    Multiple Vitamins-Minerals (CENTRUM SILVER PO) Take 1 tablet by mouth daily.   Omega-3 Fatty Acids (FISH OIL) 1000 MG CAPS Take 1 capsule by mouth daily.    OVER THE COUNTER MEDICATION Take 1 tablet by mouth 3 (three) times daily. Bone strength   QUERCETIN PO Take 1 tablet by mouth daily.   triamcinolone (NASACORT) 55 MCG/ACT AERO nasal inhaler Place 2 sprays into the nose daily as needed (allergies).    triamcinolone cream (KENALOG) 0.1 % Apply 1 application topically 2 (two) times daily as needed (eczema.).    Ubiquinol 100 MG CAPS Take 1 capsule by mouth 2 (two) times daily.   Zinc Gluconate 30 MG TABS Take 30 mg by mouth daily.   aspirin EC 81 MG tablet Take 81 mg by mouth daily. (Patient not taking: Reported on 02/25/2021)   cetirizine (ZYRTEC) 10 MG tablet Take 10 mg by mouth daily as needed for allergies.  (Patient not taking: Reported on 02/25/2021)   Glucosamine HCl-MSM (MSM GLUCOSAMINE PO) Take 2 tablets by mouth daily.  (Patient not taking: Reported on 02/25/2021)   [DISCONTINUED] lidocaine (LIDODERM) 5 % Place 1 patch onto the skin daily. Remove & Discard patch within 12 hours or as directed by MD (Patient taking differently: Place 1 patch onto the skin daily.)   No facility-administered encounter medications on file as of 02/25/2021.    Allergies (verified) Macrobid  [nitrofurantoin macrocrystal], Doxycycline, Pravastatin sodium, Amoxicillin-pot clavulanate, Augmentin [amoxicillin-pot clavulanate], Codeine, Methylphenidate hcl, Methylprednisolone, and Sulfamethoxazole-trimethoprim  History: Past Medical History:  Diagnosis Date   Allergy    Arthritis 07/2014   Right hand, prescribed prednisone-no longer taking. also Rt knww   Brain aneurysm    DVT (deep venous thrombosis) ([place]) 2015   very small; treated with compression and heat, no other problems since   GERD (gastroesophageal reflux disease)    patient denies states she does have hx of peptic ulcer many years   Hepatitis     in 10 th grade  (not sure type- not hep C) - treated  no other problems   Hyperlipidemia    diet controlled   Memory loss    MVP (mitral valve prolapse)    no problems, MD told patient she could not hear it   Osteoarthritis    Osteopenia    [facility]   PONV (postoperative nausea and vomiting)    with ear surgery only - No other problems with other surgeries   Precancerous lesion 12/2014   Treated with cream only   TGA (transient global amnesia)    Varicose veins    Vitamin D deficiency    Past Surgical History:  Procedure Laterality Date   APPENDECTOMY     BUNIONECTOMY WITH HAMMERTOE RECONSTRUCTION Left 04/22/2016   Procedure: MODIFIED MCBRIDE BUNIONECTOMY,  LEFT SECOND HAMMERTOE CORRECTION;  Surgeon: Wylene Simmer, MD;  Location: [facility];  Service: Orthopedics;  Laterality: Left;   COLONOSCOPY     07/2012 Olevia Perches - [facility]/father - polyps   colonoscopy with polypectomy  07/2012    hyperplastic;Dr Brodie   DILATION AND CURETTAGE OF UTERUS     DILATION AND CURETTAGE OF UTERUS N/A 09/03/2014   Procedure: DILATATION AND CURETTAGE;  Surgeon: Harle Battiest, MD;  Location: [facility];  Service: Gynecology;  Laterality: N/A;   ENDOVENOUS ABLATION SAPHENOUS VEIN W/ LASER Left 01-31-2014   EVLA  left greater saphenous vein  by Curt Jews MD   ENDOVENOUS ABLATION SAPHENOUS VEIN W/ LASER Right 03-14-2014   endovenous laser ablation right greater saphenous vein  by Curt Jews MD   FOOT ARTHRODESIS Left 04/22/2016   Procedure: LEFT FIRST TARSAL METATARSAL ARTHRODESIS;  Surgeon: Wylene Simmer, MD;  Location: [facility];  Service: Orthopedics;  Laterality: Left;   G 3 P 3     HYSTEROSCOPY WITH D & C N/A 03/25/2016   Procedure: DILATATION AND CURETTAGE /HYSTEROSCOPY;  Surgeon: Jerelyn Charles, MD;  Location: [facility];  Service: Gynecology;  Laterality: N/A;  WITH ULTRASOUND GUIDANCE   left ear surgery     STAPEDECTOMY     TARSAL METATARSAL FUSION WITH WEIL  OSTEOTOMY Left 04/22/2016   Procedure: 2-4 TARSAL METATARSAL FUSION WITH WEIL OSTEOTOMY;  Surgeon: Wylene Simmer, MD;  Location: [facility];  Service: Orthopedics;  Laterality: Left;   TUBAL LIGATION     Family History  Problem Relation Age of Onset   Pancreatic cancer Mother    Miscarriages / [place] Mother    Thyroid disease Mother        partial thyroidectomy ; S/P RAI   Heart attack Father        in early 34s   Colon cancer Father    Seizures Sister    Ovarian cancer Maternal Aunt    Breast cancer Paternal Aunt    Aneurysm Paternal Uncle        brain   Colon cancer Paternal Grandmother    Stroke Paternal Grandmother        > 32   Heart  attack Paternal Grandfather        in 34s   Cancer Other        among paternal sibs (colon, breast, bone)   Diabetes Neg Hx    Stomach cancer Neg Hx    Ulcerative colitis Neg Hx    Esophageal cancer Neg Hx    Rectal cancer Neg Hx    Social History   Socioeconomic History   Marital status: Married    Spouse name: [name]   Number of children: Not on file   Years of education: Not on file   Highest education level: Not on file  Occupational History   Occupation: retired  Tobacco Use   Smoking status: Former    Types: Cigarettes    Quit date: 04/27/1963    Years since quitting: 57.8   Smokeless tobacco: Never   Tobacco comments:    Smoked < 6 mos;< 1 pack total;quit 1965  Vaping Use   Vaping Use: Never used  Substance and Sexual Activity   Alcohol use: No   Drug use: No   Sexual activity: Yes    Birth control/protection: Post-menopausal  Other Topics Concern   Not on file  Social History Narrative   Lives with husband   Right Handed   Drinks average 2-3 cups caffeine daily   Social Determinants of Health   Financial Resource Strain: Low Risk    Difficulty of Paying Living Expenses: Not hard at all  Food Insecurity: No Food Insecurity   Worried About Charity fundraiser in the Last Year: Never true   [place] in the Last Year: Never true  Transportation Needs: No Transportation Needs   Lack of Transportation (Medical): No   Lack of Transportation (Non-Medical): No  Physical Activity: Inactive   Days of Exercise per Week: 0 days   Minutes of Exercise per Session: 0 min  Stress: No Stress Concern Present   Feeling of Stress : Not at all  Social Connections: Moderately Integrated   Frequency of Communication with Friends and Family: More than three times a week   Frequency of Social Gatherings with Friends and Family: More than three times a week   Attends Religious Services: More than 4 times per year   Active Member of [employer] or Organizations: No   Attends Music therapist: Never   Marital Status: Married    Tobacco Counseling Counseling given: Not Answered Tobacco comments: Smoked < 6 mos;< 1 pack total;quit 1965   Clinical Intake:  Pre-visit preparation completed: Yes  Pain : No/denies pain     BMI - recorded: 25.67 Nutritional Status: BMI 25 -29 Overweight Nutritional Risks: None Diabetes: No  How often do you need to have someone help you when you read instructions, pamphlets, or other written materials from your doctor or pharmacy?: 1 - Never  Diabetic?No  Interpreter Needed?: No  Information entered by :: Caroleen Hamman LPN   Activities of Daily Living In your present state of health, do you have any difficulty performing the following activities: 02/25/2021 05/01/2020  Hearing? N Y  Comment - hoh in right ear  Vision? N N  Difficulty concentrating or making decisions? N Y  Comment - trouble with memory today  Walking or climbing stairs? N N  Dressing or bathing? N N  Doing errands, shopping? N N  Preparing Food and eating ? N -  Using the Toilet? N -  In the past six months, have you accidently leaked urine? N -  Do you have problems with loss of bowel control? N -  Managing your Medications? N -  Managing your Finances? N -   Housekeeping or managing your Housekeeping? N -  Some recent data might be hidden    Patient Care Team: Carollee Herter, Alferd Apa, DO as PCP - General (Family Medicine) Jerelyn Charles, MD as Consulting Physician (Obstetrics) Latanya Maudlin, MD as Consulting Physician (Orthopedic Surgery) Harriett Sine, MD as Consulting Physician (Dermatology) Loletha Carrow Kirke Corin, MD as Consulting Physician (Gastroenterology)  Indicate any recent Medical Services you may have received from other than [facility] providers in the past year (date may be approximate).     Assessment:   This is a routine wellness examination for [place].  Hearing/Vision screen Hearing Screening - Comments:: C/o hearing loss in right ear Vision Screening - Comments:: Wears glasses Last eye exam-summer 2022-Dr Mendel Ryder  Dietary issues and exercise activities discussed: Current Exercise Habits: The patient does not participate in regular exercise at present, Exercise limited by: None identified   Goals Addressed             This Visit's Progress    Patient Stated       Drink more water & increase activity       Depression Screen PHQ 2/9 Scores 02/25/2021 10/21/2020 12/28/2019 05/12/2018 11/07/2017 08/05/2017 07/29/2016  PHQ - 2 Score 0 0 0 0 0 4 0  PHQ- 9 Score - - - - - 8 -    Fall Risk Fall Risk  02/25/2021 10/21/2020 12/28/2019 10/18/2019 05/12/2018  Falls in the past year? 0 0 0 1 0  Number falls in past yr: 0 0 0 0 -  Comment - - - - -  Injury with Fall? 0 0 0 1 -  Comment - - - TAILBONE FRACTURE -  Follow up Falls prevention discussed Falls evaluation completed Education provided;Falls prevention discussed - Falls evaluation completed    FALL RISK PREVENTION PERTAINING TO THE HOME:  Any stairs in or around the home? Yes  If so, are there any without handrails? No  Home free of loose throw rugs in walkways, pet beds, electrical cords, etc? Yes  Adequate lighting in your home to reduce risk of falls? Yes   ASSISTIVE  DEVICES UTILIZED TO PREVENT FALLS:  Life alert? No  Use of a cane, walker or w/c? No  Grab bars in the bathroom? Yes  Shower chair or bench in shower? Yes  Elevated toilet seat or a handicapped toilet? No   TIMED UP AND GO:  Was the test performed? Yes .  Length of time to ambulate 10 feet: 11 sec.   Gait steady and fast without use of assistive device  Cognitive Function:Normal cognitive status assessed by direct observation by this Nurse Health Advisor. No abnormalities found.   MMSE - Mini Mental State Exam 07/29/2016 04/24/2015  Not completed: - (No Data)  Orientation to time 5 -  Orientation to Place 5 -  Registration 3 -  Attention/ Calculation 5 -  Recall 3 -  Language- name 2 objects 2 -  Language- repeat 1 -  Language- follow 3 step command 3 -  Language- read & follow direction 1 -  Write a sentence 1 -  Copy design 1 -  Total score 30 -        Immunizations Immunization History  Administered Date(s) Administered   Fluad Quad(high Dose 65+) 01/17/2019   Influenza Whole 05/16/2012   Influenza, High Dose Seasonal PF 02/21/2014, 01/19/2016, 01/28/2018,  02/09/2021   Influenza,inj,quad, With Preservative 02/01/2017   Influenza-Unspecified 01/29/2015, 02/01/2017, 12/25/2017, 01/25/2019   Pneumococcal Conjugate-13 01/08/2015   Pneumococcal Polysaccharide-23 06/11/2011   Td 01/25/2008   Tdap 06/27/2018   Zoster Recombinat (Shingrix) 08/02/2016, 10/01/2016   Zoster, Live 12/13/2012    TDAP status: Up to date  Flu Vaccine status: Up to date  Pneumococcal vaccine status: Up to date  Covid-19 vaccine status: Declined, Education has been provided regarding the importance of this vaccine but patient still declined. Advised may receive this vaccine at local pharmacy or Health Dept.or vaccine clinic. Aware to provide a copy of the vaccination record if obtained from local pharmacy or Health Dept. Verbalized acceptance and understanding.  Qualifies for Shingles  Vaccine? No   Zostavax completed Yes   Shingrix Completed?: Yes  Screening Tests Health Maintenance  Topic Date Due   COVID-19 Vaccine (1) 03/13/2021 (Originally 11/14/1945)   DEXA SCAN  06/28/2021   COLONOSCOPY (Pts 45-54yrs Insurance coverage will need to be confirmed)  11/24/2025   TETANUS/TDAP  06/26/2028   Pneumonia Vaccine 62+ Years old  Completed   INFLUENZA VACCINE  Completed   Hepatitis C Screening  Completed   Zoster Vaccines- Shingrix  Completed   HPV VACCINES  Aged Out    Health Maintenance  There are no preventive care reminders to display for this patient.   Colorectal cancer screening: No longer required.   Mammogram status: Scheduled for 03/02/2021  Bone Density status: Completed 06/29/2019. Results reflect: Bone density results: NORMAL. Repeat every 2 years.  Lung Cancer Screening: (Low Dose CT Chest recommended if Age 49-80 years, 30 pack-year currently smoking OR have quit w/in 15years.) does not qualify.     Additional Screening:  Hepatitis C Screening: Completed 06/30/2015  Vision Screening: Recommended annual ophthalmology exams for early detection of glaucoma and other disorders of the eye. Is the patient up to date with their annual eye exam?  Yes  Who is the provider or what is the name of the office in which the patient attends annual eye exams? Dr. Mendel Ryder   Dental Screening: Recommended annual dental exams for proper oral hygiene  Community Resource Referral / Chronic Care Management: CRR required this visit?  No   CCM required this visit?  No      Plan:     I have personally reviewed and noted the following in the patient's chart:   Medical and social history Use of alcohol, tobacco or illicit drugs  Current medications and supplements including opioid prescriptions.  Functional ability and status Nutritional status Physical activity Advanced directives List of other physicians Hospitalizations, surgeries, and ER visits in  previous 12 months Vitals Screenings to include cognitive, depression, and falls Referrals and appointments  In addition, I have reviewed and discussed with patient certain preventive protocols, quality metrics, and best practice recommendations. A written personalized care plan for preventive services as well as general preventive health recommendations were provided to patient.   Patient to access avs on mychart  Marta Antu, [place]   [id number]  Nurse Health Advisor  Nurse Notes: None

## 2021-03-02 DIAGNOSIS — Z1231 Encounter for screening mammogram for malignant neoplasm of breast: Secondary | ICD-10-CM | POA: Diagnosis not present

## 2021-04-08 ENCOUNTER — Encounter: Payer: Self-pay | Admitting: Family Medicine

## 2021-04-08 ENCOUNTER — Ambulatory Visit (INDEPENDENT_AMBULATORY_CARE_PROVIDER_SITE_OTHER): Payer: Medicare PPO | Admitting: Family Medicine

## 2021-04-08 VITALS — BP 138/78 | HR 74 | Temp 97.9°F | Ht 63.5 in | Wt 145.0 lb

## 2021-04-08 DIAGNOSIS — R058 Other specified cough: Secondary | ICD-10-CM | POA: Diagnosis not present

## 2021-04-08 MED ORDER — BENZONATATE 200 MG PO CAPS: 200.0000 mg | ORAL_CAPSULE | Freq: Two times a day (BID) | ORAL | 0 refills | Status: DC | PRN

## 2021-04-08 NOTE — Progress Notes (Signed)
Chief Complaint  Patient presents with   Cough    Kim Mclaughlin here for URI complaints.  Duration: 2 weeks  Associated symptoms: itchy watery eyes and cough; runny/stuffy nose, ST resolved  Denies: sinus congestion, sinus pain, rhinorrhea, ear pain, ear drainage, sore throat, wheezing, shortness of breath, myalgia, and fevers, N/V/D, loss of taste/smell Treatment to date: air humidifier Sick contacts: Yes; grandson Tested neg for covid.   Past Medical History:  Diagnosis Date   Allergy    Arthritis 07/2014   Right hand, prescribed prednisone-no longer taking. also Rt knww   Brain aneurysm    DVT (deep venous thrombosis) ([place]) 2015   very small; treated with compression and heat, no other problems since   GERD (gastroesophageal reflux disease)    patient denies states she does have hx of peptic ulcer many years   Hepatitis    in 10 th grade  (not sure type- not hep C) - treated  no other problems   Hyperlipidemia    diet controlled   Memory loss    MVP (mitral valve prolapse)    no problems, MD told patient she could not hear it   Osteoarthritis    Osteopenia    [facility]   PONV (postoperative nausea and vomiting)    with ear surgery only - No other problems with other surgeries   Precancerous lesion 12/2014   Treated with cream only   TGA (transient global amnesia)    Varicose veins    Vitamin D deficiency     Objective BP 138/78    Pulse 74    Temp 97.9 F (36.6 C) (Oral)    Ht 5' 3.5" (1.613 m)    Wt 145 lb (65.8 kg)    SpO2 95%    BMI 25.28 kg/m  General: Awake, alert, appears stated age HEENT: AT, [place], ears patent b/l and TM's neg, nares patent w/o discharge, pharynx pink and without exudates, MMM Neck: No masses or asymmetry Heart: RRR Lungs: CTAB, no accessory muscle use Psych: Age appropriate judgment and insight, normal mood and affect  Post-viral cough syndrome - Plan: benzonatate (TESSALON) 200 MG capsule  Should self-resolve. PRN tx as above. Will  send in ICS if not improving, she will reach out on MyChart.  F/u prn.  Pt voiced understanding and agreement to the plan.  [place], DO 04/08/21 3:35 PM

## 2021-04-08 NOTE — Patient Instructions (Addendum)
Stay hydrated.   Let me know if you would like to go on a daily inhaler.   This will resolve on its own.  Let us know if you need anything.

## 2021-04-09 ENCOUNTER — Encounter: Payer: Self-pay | Admitting: Family Medicine

## 2021-04-09 ENCOUNTER — Ambulatory Visit: Payer: Medicare PPO | Admitting: Podiatry

## 2021-04-16 ENCOUNTER — Encounter: Payer: Self-pay | Admitting: Family Medicine

## 2021-04-16 ENCOUNTER — Ambulatory Visit (INDEPENDENT_AMBULATORY_CARE_PROVIDER_SITE_OTHER): Payer: Medicare PPO | Admitting: Family Medicine

## 2021-04-16 VITALS — BP 128/78 | HR 68 | Temp 98.1°F | Resp 18 | Ht 63.5 in | Wt 143.0 lb

## 2021-04-16 DIAGNOSIS — E785 Hyperlipidemia, unspecified: Secondary | ICD-10-CM

## 2021-04-16 LAB — COMPREHENSIVE METABOLIC PANEL
ALT: 27 U/L (ref 0–35)
AST: 27 U/L (ref 0–37)
Albumin: 4 g/dL (ref 3.5–5.2)
Alkaline Phosphatase: 82 U/L (ref 39–117)
BUN: 13 mg/dL (ref 6–23)
CO2: 30 mEq/L (ref 19–32)
Calcium: 9.6 mg/dL (ref 8.4–10.5)
Chloride: 106 mEq/L (ref 96–112)
Creatinine, Ser: 0.79 mg/dL (ref 0.40–1.20)
GFR: 72.92 mL/min (ref >60.00)
Glucose, Bld: 89 mg/dL (ref 70–99)
Potassium: 4.5 mEq/L (ref 3.5–5.1)
Sodium: 141 mEq/L (ref 135–145)
Total Bilirubin: 0.7 mg/dL (ref 0.2–1.2)
Total Protein: 6.3 g/dL (ref 6.0–8.3)

## 2021-04-16 LAB — LIPID PANEL
Cholesterol: 128 mg/dL (ref 0–200)
HDL: 76.5 mg/dL (ref >39.00)
LDL Cholesterol: 42 mg/dL (ref 0–99)
NonHDL: 51.18
Total CHOL/HDL Ratio: 2
Triglycerides: 45 mg/dL (ref 0.0–149.0)
VLDL: 9 mg/dL (ref 0.0–40.0)

## 2021-04-16 MED ORDER — ATORVASTATIN CALCIUM 80 MG PO TABS: 80.0000 mg | ORAL_TABLET | Freq: Every day | ORAL | 1 refills | Status: DC

## 2021-04-16 NOTE — Progress Notes (Signed)
Subjective:   By signing my name below, I, Kim Mclaughlin, attest that this documentation has been prepared under the direction and in the presence of Dr. Roma Schanz, DO. 04/16/2021    Patient ID: Kim Mclaughlin, female    DOB: [birth year], 75 y.o.   MRN: [id number]  Chief Complaint  Patient presents with   Hyperlipidemia   Follow-up    Hyperlipidemia  Patient is in today for a office visit.  She continues having a nagging cough at this time. She has seen another provider last week to manage it and found she recovered from a virus. The cough is slowly improving. She finds the cold weather worsens her symptoms. She is taking 200 mg benzonatate PRN to manage her symptoms.  She is requesting a refill on 80 mg Lipitor daily PO.  She is UTD on flu vaccines. She is not interested in receiving a Covid-19 vaccine.    Past Medical History:  Diagnosis Date   Allergy    Arthritis 07/2014   Right hand, prescribed prednisone-no longer taking. also Rt knww   Brain aneurysm    DVT (deep venous thrombosis) ([place]) 2015   very small; treated with compression and heat, no other problems since   GERD (gastroesophageal reflux disease)    patient denies states she does have hx of peptic ulcer many years   Hepatitis    in 10 th grade  (not sure type- not hep C) - treated  no other problems   Hyperlipidemia    diet controlled   Memory loss    MVP (mitral valve prolapse)    no problems, MD told patient she could not hear it   Osteoarthritis    Osteopenia    [facility]   PONV (postoperative nausea and vomiting)    with ear surgery only - No other problems with other surgeries   Precancerous lesion 12/2014   Treated with cream only   TGA (transient global amnesia)    Varicose veins    Vitamin D deficiency     Past Surgical History:  Procedure Laterality Date   APPENDECTOMY     BUNIONECTOMY WITH HAMMERTOE RECONSTRUCTION Left 04/22/2016   Procedure: MODIFIED MCBRIDE BUNIONECTOMY,   LEFT SECOND HAMMERTOE CORRECTION;  Surgeon: Wylene Simmer, MD;  Location: [facility];  Service: Orthopedics;  Laterality: Left;   COLONOSCOPY     07/2012 Olevia Perches - [facility]/father - polyps   colonoscopy with polypectomy  07/2012    hyperplastic;Dr Brodie   DILATION AND CURETTAGE OF UTERUS     DILATION AND CURETTAGE OF UTERUS N/A 09/03/2014   Procedure: DILATATION AND CURETTAGE;  Surgeon: Harle Battiest, MD;  Location: [facility];  Service: Gynecology;  Laterality: N/A;   ENDOVENOUS ABLATION SAPHENOUS VEIN W/ LASER Left 01-31-2014   EVLA  left greater saphenous vein  by Curt Jews MD   ENDOVENOUS ABLATION SAPHENOUS VEIN W/ LASER Right 03-14-2014   endovenous laser ablation right greater saphenous vein  by Curt Jews MD   FOOT ARTHRODESIS Left 04/22/2016   Procedure: LEFT FIRST TARSAL METATARSAL ARTHRODESIS;  Surgeon: Wylene Simmer, MD;  Location: [facility];  Service: Orthopedics;  Laterality: Left;   G 3 P 3     HYSTEROSCOPY WITH D & C N/A 03/25/2016   Procedure: DILATATION AND CURETTAGE /HYSTEROSCOPY;  Surgeon: Jerelyn Charles, MD;  Location: [facility];  Service: Gynecology;  Laterality: N/A;  WITH ULTRASOUND GUIDANCE   left ear surgery     STAPEDECTOMY  TARSAL METATARSAL FUSION WITH WEIL OSTEOTOMY Left 04/22/2016   Procedure: 2-4 TARSAL METATARSAL FUSION WITH WEIL OSTEOTOMY;  Surgeon: Wylene Simmer, MD;  Location: [facility];  Service: Orthopedics;  Laterality: Left;   TUBAL LIGATION      Family History  Problem Relation Age of Onset   Pancreatic cancer Mother    Miscarriages / [place] Mother    Thyroid disease Mother        partial thyroidectomy ; S/P RAI   Heart attack Father        in early 76s   Colon cancer Father    Seizures Sister    Ovarian cancer Maternal Aunt    Breast cancer Paternal Aunt    Aneurysm Paternal Uncle        brain   Colon cancer Paternal Grandmother    Stroke Paternal Grandmother        > 10   Heart attack  Paternal Grandfather        in 33s   Cancer Other        among paternal sibs (colon, breast, bone)   Diabetes Neg Hx    Stomach cancer Neg Hx    Ulcerative colitis Neg Hx    Esophageal cancer Neg Hx    Rectal cancer Neg Hx     Social History   Socioeconomic History   Marital status: Married    Spouse name: Kim Mclaughlin   Number of children: Not on file   Years of education: Not on file   Highest education level: Not on file  Occupational History   Occupation: retired  Tobacco Use   Smoking status: Former    Types: Cigarettes    Quit date: 04/27/1963    Years since quitting: 58.0   Smokeless tobacco: Never   Tobacco comments:    Smoked < 6 mos;< 1 pack total;quit 1965  Vaping Use   Vaping Use: Never used  Substance and Sexual Activity   Alcohol use: No   Drug use: No   Sexual activity: Yes    Birth control/protection: Post-menopausal  Other Topics Concern   Not on file  Social History Narrative   Lives with husband   Right Handed   Drinks average 2-3 cups caffeine daily   Social Determinants of Health   Financial Resource Strain: Low Risk    Difficulty of Paying Living Expenses: Not hard at all  Food Insecurity: No Food Insecurity   Worried About Charity fundraiser in the Last Year: Never true   [place] in the Last Year: Never true  Transportation Needs: No Transportation Needs   Lack of Transportation (Medical): No   Lack of Transportation (Non-Medical): No  Physical Activity: Inactive   Days of Exercise per Week: 0 days   Minutes of Exercise per Session: 0 min  Stress: No Stress Concern Present   Feeling of Stress : Not at all  Social Connections: Moderately Integrated   Frequency of Communication with Friends and Family: More than three times a week   Frequency of Social Gatherings with Friends and Family: More than three times a week   Attends Religious Services: More than 4 times per year   Active Member of [employer] or Organizations: No   Attends English as a second language teacher Meetings: Never   Marital Status: Married  Human resources officer Violence: Not At Risk   Fear of Current or Ex-Partner: No   Emotionally Abused: No   Physically Abused: No   Sexually Abused: No  Outpatient Medications Prior to Visit  Medication Sig Dispense Refill   Ascorbic Acid (VITAMIN C) 1000 MG tablet Take 1,000 mg by mouth daily.     benzonatate (TESSALON) 200 MG capsule Take 1 capsule (200 mg total) by mouth 2 (two) times daily as needed for cough. 20 capsule 0   Bioflavonoid Products (QUERCETIN COMPLEX IMMUNE PO) Take 1 tablet by mouth in the morning, at noon, and at bedtime.     Boswellia-Glucosamine-Vit D (OSTEO BI-FLEX ONE PER DAY PO) Take 1 tablet by mouth 2 (two) times daily.     cetirizine (ZYRTEC) 10 MG tablet Take 10 mg by mouth daily as needed for allergies.     Cholecalciferol (VITAMIN D-3) 25 MCG (1000 UT) CAPS Take 1 capsule by mouth 2 (two) times daily.     Glucosamine HCl-MSM (MSM GLUCOSAMINE PO) Take 2 tablets by mouth daily.     Omega-3 Fatty Acids (FISH OIL) 1000 MG CAPS Take 1 capsule by mouth daily.      OVER THE COUNTER MEDICATION Take 1 tablet by mouth 3 (three) times daily. Bone strength     QUERCETIN PO Take 1 tablet by mouth daily.     triamcinolone (NASACORT) 55 MCG/ACT AERO nasal inhaler Place 2 sprays into the nose daily as needed (allergies).      triamcinolone cream (KENALOG) 0.1 % Apply 1 application topically 2 (two) times daily as needed (eczema.).      Ubiquinol 100 MG CAPS Take 1 capsule by mouth 2 (two) times daily.     Zinc Gluconate 30 MG TABS Take 30 mg by mouth daily.     atorvastatin (LIPITOR) 80 MG tablet TAKE 1 TABLET BY MOUTH EVERY DAY 90 tablet 1   No facility-administered medications prior to visit.    Allergies  Allergen Reactions   Macrobid  [Nitrofurantoin Macrocrystal] Other (See Comments)   Doxycycline Hives   Pravastatin Sodium     REACTION: shin pain   Amoxicillin-Pot Clavulanate     GI symptoms; abdominal  pain & nausea NOTE: able to take Ampicillin   Augmentin [Amoxicillin-Pot Clavulanate] Nausea Only    Upsets stomach   Codeine Nausea Only   Methylphenidate Hcl Palpitations   Methylprednisolone Rash    Rash and redness on face   Sulfamethoxazole-Trimethoprim Nausea Only     nausea    Review of Systems  Respiratory:  Positive for cough.       Objective:    Physical Exam Constitutional:      General: She is not in acute distress.    Appearance: Normal appearance. She is not ill-appearing.  HENT:     Head: Normocephalic and atraumatic.     Right Ear: External ear normal.     Left Ear: External ear normal.  Eyes:     Extraocular Movements: Extraocular movements intact.     Pupils: Pupils are equal, round, and reactive to light.  Cardiovascular:     Rate and Rhythm: Normal rate and regular rhythm.     Heart sounds: Normal heart sounds. No murmur heard.   No gallop.  Pulmonary:     Effort: Pulmonary effort is normal. No respiratory distress.     Breath sounds: Normal breath sounds. No wheezing or rales.  Skin:    General: Skin is warm and dry.  Neurological:     Mental Status: She is alert and oriented to person, place, and time.  Psychiatric:        Behavior: Behavior normal.  Judgment: Judgment normal.    BP 128/78 (BP Location: Left Arm, Patient Position: Sitting, Cuff Size: Normal)    Pulse 68    Temp 98.1 F (36.7 C) (Oral)    Resp 18    Ht 5' 3.5" (1.613 m)    Wt 143 lb (64.9 kg)    SpO2 97%    BMI 24.93 kg/m  Wt Readings from Last 3 Encounters:  04/16/21 143 lb (64.9 kg)  04/08/21 145 lb (65.8 kg)  02/25/21 147 lb 3.2 oz (66.8 kg)    Diabetic Foot Exam - Simple   No data filed    Lab Results  Component Value Date   WBC 6.4 11/14/2020   HGB 13.8 11/14/2020   HCT 42.5 11/14/2020   PLT 283.0 11/14/2020   GLUCOSE 90 10/21/2020   CHOL 131 10/21/2020   TRIG 52.0 10/21/2020   HDL 76.70 10/21/2020   LDLDIRECT 188.0 08/08/2012   LDLCALC 44 10/21/2020    ALT 26 10/21/2020   AST 25 10/21/2020   NA 139 10/21/2020   K 3.8 10/21/2020   CL 103 10/21/2020   CREATININE 0.82 10/21/2020   BUN 14 10/21/2020   CO2 27 10/21/2020   TSH 1.302 05/01/2020   INR 1.0 05/01/2020   HGBA1C 5.7 (H) 05/02/2020    Lab Results  Component Value Date   TSH 1.302 05/01/2020   Lab Results  Component Value Date   WBC 6.4 11/14/2020   HGB 13.8 11/14/2020   HCT 42.5 11/14/2020   MCV 94.3 11/14/2020   PLT 283.0 11/14/2020   Lab Results  Component Value Date   NA 139 10/21/2020   K 3.8 10/21/2020   CO2 27 10/21/2020   GLUCOSE 90 10/21/2020   BUN 14 10/21/2020   CREATININE 0.82 10/21/2020   BILITOT 0.6 10/21/2020   ALKPHOS 87 10/21/2020   AST 25 10/21/2020   ALT 26 10/21/2020   PROT 6.8 10/21/2020   ALBUMIN 4.3 10/21/2020   CALCIUM 10.0 10/21/2020   ANIONGAP 11 05/02/2020   GFR 69.97 10/21/2020   Lab Results  Component Value Date   CHOL 131 10/21/2020   Lab Results  Component Value Date   HDL 76.70 10/21/2020   Lab Results  Component Value Date   LDLCALC 44 10/21/2020   Lab Results  Component Value Date   TRIG 52.0 10/21/2020   Lab Results  Component Value Date   CHOLHDL 2 10/21/2020   Lab Results  Component Value Date   HGBA1C 5.7 (H) 05/02/2020       Assessment & Plan:   Problem List Items Addressed This Visit       Unprioritized   Hyperlipidemia - Primary    Tolerating statin, encouraged heart healthy diet, avoid trans fats, minimize simple carbs and saturated fats. Increase exercise as tolerated      Relevant Medications   atorvastatin (LIPITOR) 80 MG tablet   Other Relevant Orders   Lipid panel   Comprehensive metabolic panel     Meds ordered this encounter  Medications   atorvastatin (LIPITOR) 80 MG tablet    Sig: Take 1 tablet (80 mg total) by mouth daily.    Dispense:  90 tablet    Refill:  1    I, Dr. Roma Schanz, DO, personally preformed the services described in this documentation.   All medical record entries made by the scribe were at my direction and in my presence.  I have reviewed the chart and discharge instructions (if applicable) and agree that the  record reflects my personal performance and is accurate and complete. 04/16/2021   I,Kim Mclaughlin,acting as a scribe for Ann Held, DO.,have documented all relevant documentation on the behalf of Ann Held, DO,as directed by  Ann Held, DO while in the presence of Ann Held, DO.   Ann Held, DO

## 2021-04-16 NOTE — Patient Instructions (Signed)
Cholesterol Content in Foods ?Cholesterol is a waxy, fat-like substance that helps to carry fat in the blood. The body needs cholesterol in small amounts, but too much cholesterol can cause damage to the arteries and heart. ?What foods have cholesterol? ?Cholesterol is found in animal-based foods, such as meat, seafood, and dairy. Generally, low-fat dairy and lean meats have less cholesterol than full-fat dairy and fatty meats. The milligrams of cholesterol per serving (mg per serving) of common cholesterol-containing foods are listed below. ?Meats and other proteins ?Egg -- one large whole egg has 186 mg. ?Veal shank -- 4 oz (113 g) has 141 mg. ?Lean ground turkey (93% lean) -- 4 oz (113 g) has 118 mg. ?Fat-trimmed lamb loin -- 4 oz (113 g) has 106 mg. ?Lean ground beef (90% lean) -- 4 oz (113 g) has 100 mg. ?Lobster -- 3.5 oz (99 g) has 90 mg. ?Pork loin chops -- 4 oz (113 g) has 86 mg. ?Canned salmon -- 3.5 oz (99 g) has 83 mg. ?Fat-trimmed beef top loin -- 4 oz (113 g) has 78 mg. ?Frankfurter -- 1 frank (3.5 oz or 99 g) has 77 mg. ?Crab -- 3.5 oz (99 g) has 71 mg. ?Roasted chicken without skin, white meat -- 4 oz (113 g) has 66 mg. ?Light bologna -- 2 oz (57 g) has 45 mg. ?Deli-cut turkey -- 2 oz (57 g) has 31 mg. ?Canned tuna -- 3.5 oz (99 g) has 31 mg. ?Bacon -- 1 oz (28 g) has 29 mg. ?Oysters and mussels (raw) -- 3.5 oz (99 g) has 25 mg. ?Mackerel -- 1 oz (28 g) has 22 mg. ?Trout -- 1 oz (28 g) has 20 mg. ?Pork sausage -- 1 link (1 oz or 28 g) has 17 mg. ?Salmon -- 1 oz (28 g) has 16 mg. ?Tilapia -- 1 oz (28 g) has 14 mg. ?Dairy ?Soft-serve ice cream -- ? cup (4 oz or 86 g) has 103 mg. ?Whole-milk yogurt -- 1 cup (8 oz or 245 g) has 29 mg. ?Cheddar cheese -- 1 oz (28 g) has 28 mg. ?American cheese -- 1 oz (28 g) has 28 mg. ?Whole milk -- 1 cup (8 oz or 250 mL) has 23 mg. ?2% milk -- 1 cup (8 oz or 250 mL) has 18 mg. ?Cream cheese -- 1 tablespoon (Tbsp) (14.5 g) has 15 mg. ?Cottage cheese -- ? cup (4 oz or 113  g) has 14 mg. ?Low-fat (1%) milk -- 1 cup (8 oz or 250 mL) has 10 mg. ?Sour cream -- 1 Tbsp (12 g) has 8.5 mg. ?Low-fat yogurt -- 1 cup (8 oz or 245 g) has 8 mg. ?Nonfat Greek yogurt -- 1 cup (8 oz or 228 g) has 7 mg. ?Half-and-half cream -- 1 Tbsp (15 mL) has 5 mg. ?Fats and oils ?Cod liver oil -- 1 tablespoon (Tbsp) (13.6 g) has 82 mg. ?Butter -- 1 Tbsp (14 g) has 15 mg. ?Lard -- 1 Tbsp (12.8 g) has 14 mg. ?Bacon grease -- 1 Tbsp (12.9 g) has 14 mg. ?Mayonnaise -- 1 Tbsp (13.8 g) has 5-10 mg. ?Margarine -- 1 Tbsp (14 g) has 3-10 mg. ?The items listed above may not be a complete list of foods with cholesterol. Exact amounts of cholesterol in these foods may vary depending on specific ingredients and brands. Contact a dietitian for more information. ?What foods do not have cholesterol? ?Most plant-based foods do not have cholesterol unless you combine them with a food that has cholesterol.   Foods without cholesterol include: ?Grains and cereals. ?Vegetables. ?Fruits. ?Vegetable oils, such as olive, canola, and sunflower oil. ?Legumes, such as peas, beans, and lentils. ?Nuts and seeds. ?Egg whites. ?The items listed above may not be a complete list of foods that do not have cholesterol. Contact a dietitian for more information. ?Summary ?The body needs cholesterol in small amounts, but too much cholesterol can cause damage to the arteries and heart. ?Cholesterol is found in animal-based foods, such as meat, seafood, and dairy. Generally, low-fat dairy and lean meats have less cholesterol than full-fat dairy and fatty meats. ?This information is not intended to replace advice given to you by your health care provider. Make sure you discuss any questions you have with your health care provider. ?Document Revised: 08/22/2020 Document Reviewed: 08/22/2020 ?Elsevier Patient Education ? 2022 [employer] ? ?

## 2021-04-16 NOTE — Assessment & Plan Note (Signed)
Tolerating statin, encouraged heart healthy diet, avoid trans fats, minimize simple carbs and saturated fats. Increase exercise as tolerated 

## 2021-04-28 DIAGNOSIS — M1711 Unilateral primary osteoarthritis, right knee: Secondary | ICD-10-CM | POA: Diagnosis not present

## 2021-05-06 DIAGNOSIS — M2021 Hallux rigidus, right foot: Secondary | ICD-10-CM | POA: Diagnosis not present

## 2021-05-06 DIAGNOSIS — M79671 Pain in right foot: Secondary | ICD-10-CM | POA: Diagnosis not present

## 2021-05-06 DIAGNOSIS — L84 Corns and callosities: Secondary | ICD-10-CM | POA: Diagnosis not present

## 2021-06-06 ENCOUNTER — Emergency Department (HOSPITAL_COMMUNITY)
Admission: EM | Admit: 2021-06-06 | Discharge: 2021-06-06 | Disposition: A | Discharge status: Home / Self Care | Payer: Medicare PPO | Attending: Emergency Medicine | Admitting: Emergency Medicine

## 2021-06-06 ENCOUNTER — Emergency Department (HOSPITAL_BASED_OUTPATIENT_CLINIC_OR_DEPARTMENT_OTHER): Payer: Medicare PPO

## 2021-06-06 ENCOUNTER — Encounter (HOSPITAL_COMMUNITY): Payer: Self-pay

## 2021-06-06 DIAGNOSIS — M79661 Pain in right lower leg: Secondary | ICD-10-CM | POA: Diagnosis not present

## 2021-06-06 DIAGNOSIS — M79604 Pain in right leg: Secondary | ICD-10-CM

## 2021-06-06 DIAGNOSIS — Z86718 Personal history of other venous thrombosis and embolism: Secondary | ICD-10-CM

## 2021-06-06 DIAGNOSIS — M7989 Other specified soft tissue disorders: Secondary | ICD-10-CM | POA: Diagnosis present

## 2021-06-06 DIAGNOSIS — I83813 Varicose veins of bilateral lower extremities with pain: Secondary | ICD-10-CM | POA: Diagnosis not present

## 2021-06-06 NOTE — ED Provider Notes (Addendum)
[facility] DEPT Provider Note   CSN: [id number] Arrival date & time: 06/06/21  0909     History  Chief Complaint  Patient presents with   Leg Pain    Kim Mclaughlin is a 76 y.o. female presenting with a concern of a blood clot in her right lower extremity.  She reports that for the past 2 days she has been having discomfort and swelling and she wants to make sure that she does not have a blood clot.  Remote history of DVT. She is not on any blood thinners.  Denies any chest pain or shortness of breath.  Says pain is better when she walks.  She tried to drink mustard because she heard it would be good for leg cramps however this did not help her either.   Leg Pain     Home Medications Prior to Admission medications   Medication Sig Start Date End Date Taking? Authorizing Provider  Ascorbic Acid (VITAMIN C) 1000 MG tablet Take 1,000 mg by mouth daily.    [provider]  atorvastatin (LIPITOR) 80 MG tablet Take 1 tablet (80 mg total) by mouth daily. 04/16/21   Ann Held, DO  benzonatate (TESSALON) 200 MG capsule Take 1 capsule (200 mg total) by mouth 2 (two) times daily as needed for cough. 04/08/21   Shelda Pal, DO  Bioflavonoid Products (QUERCETIN COMPLEX IMMUNE PO) Take 1 tablet by mouth in the morning, at noon, and at bedtime.    [provider]  Boswellia-Glucosamine-Vit D (OSTEO BI-FLEX ONE PER DAY PO) Take 1 tablet by mouth 2 (two) times daily.    [provider]  cetirizine (ZYRTEC) 10 MG tablet Take 10 mg by mouth daily as needed for allergies.    [provider]  Cholecalciferol (VITAMIN D-3) 25 MCG (1000 UT) CAPS Take 1 capsule by mouth 2 (two) times daily.    [provider]  Glucosamine HCl-MSM (MSM GLUCOSAMINE PO) Take 2 tablets by mouth daily.    [provider]  Omega-3 Fatty Acids (FISH OIL) 1000 MG CAPS Take 1 capsule by mouth daily.     [provider]  OVER THE COUNTER MEDICATION Take 1 tablet by mouth 3 (three) times daily. Bone strength    [provider]  QUERCETIN PO Take 1 tablet by mouth daily.    [provider]  triamcinolone (NASACORT) 55 MCG/ACT AERO nasal inhaler Place 2 sprays into the nose daily as needed (allergies).     [provider]  triamcinolone cream (KENALOG) 0.1 % Apply 1 application topically 2 (two) times daily as needed (eczema.).     [provider]  Ubiquinol 100 MG CAPS Take 1 capsule by mouth 2 (two) times daily.    [provider]  Zinc Gluconate 30 MG TABS Take 30 mg by mouth daily.    [provider]      Allergies    Macrobid  [nitrofurantoin macrocrystal], Doxycycline, Pravastatin sodium, Amoxicillin-pot clavulanate, Augmentin [amoxicillin-pot clavulanate], Codeine, Methylphenidate hcl, Methylprednisolone, and Sulfamethoxazole-trimethoprim    Review of Systems   Review of Systems See HPI Physical Exam Updated Vital Signs BP (!) 163/77 (BP Location: Right Arm)    Pulse 84    Temp 98.2 F (36.8 C) (Oral)    Resp 16    SpO2 99%  Physical Exam Vitals and nursing note reviewed.  Constitutional:      General: She is not in acute distress.    Appearance:  Normal appearance. She is not ill-appearing.  HENT:     Head: Normocephalic and atraumatic.  Eyes:     General: No scleral icterus.    Conjunctiva/sclera: Conjunctivae normal.  Pulmonary:     Effort: Pulmonary effort is normal. No respiratory distress.  Musculoskeletal:     Comments: No appreciable swelling or pitting edema to either lower extremity.  Patient with tortuous varicose veins.  Strong pulses bilaterally.  Skin:    General: Skin is warm and dry.     Findings: No rash.  Neurological:     Mental Status: She is alert.     Gait: Gait normal.  Psychiatric:        Mood and Affect: Mood normal.    ED Results / Procedures / Treatments   Labs (all labs ordered are listed,  but only abnormal results are displayed) Labs Reviewed - No data to display  EKG None  Radiology VAS [place] LOWER EXTREMITY VENOUS (DVT) (7a-7p)  Result Date: 06/06/2021  Lower Venous DVT Study Patient Name:  Kim Mclaughlin  Date of Exam:   06/06/2021 Medical Rec #: [id number]           Accession #:    [id number] Date of Birth: [birth year]           Patient Gender: F Patient Age:   69 years Exam Location:  [facility] Procedure:      VAS [place] LOWER EXTREMITY VENOUS (DVT) Referring Phys: Mckynlee Luse --------------------------------------------------------------------------------  Indications: Pain in right calf, remote history of DVT, varicosities.  Comparison Study: 05-22-2014 Prior right lower extremity venous. Performing Technologist: Darlin Coco RDMS, RVT  Examination Guidelines: A complete evaluation includes B-mode imaging, spectral Doppler, color Doppler, and power Doppler as needed of all accessible portions of each vessel. Bilateral testing is considered an integral part of a complete examination. Limited examinations for reoccurring indications may be performed as noted. The reflux portion of the exam is performed with the patient in reverse Trendelenburg.  +---------+---------------+---------+-----------+----------+--------------+  RIGHT     Compressibility Phasicity Spontaneity Properties Thrombus Aging  +---------+---------------+---------+-----------+----------+--------------+  CFV       Full            Yes       Yes                                    +---------+---------------+---------+-----------+----------+--------------+  SFJ       Full                                                             +---------+---------------+---------+-----------+----------+--------------+  FV Prox   Full                                                             +---------+---------------+---------+-----------+----------+--------------+  FV Mid    Full                                                              +---------+---------------+---------+-----------+----------+--------------+  FV Distal Full                                                             +---------+---------------+---------+-----------+----------+--------------+  PFV       Full                                                             +---------+---------------+---------+-----------+----------+--------------+  POP       Full            Yes       No                                     +---------+---------------+---------+-----------+----------+--------------+  PTV       Full                                                             +---------+---------------+---------+-----------+----------+--------------+  PERO      Full                      No                                     +---------+---------------+---------+-----------+----------+--------------+  Gastroc   Full            Yes       No                                     +---------+---------------+---------+-----------+----------+--------------+   +----+---------------+---------+-----------+----------+--------------+  LEFT Compressibility Phasicity Spontaneity Properties Thrombus Aging  +----+---------------+---------+-----------+----------+--------------+  CFV  Full            Yes       Yes                                    +----+---------------+---------+-----------+----------+--------------+     Summary: RIGHT: - There is no evidence of deep vein thrombosis in the lower extremity.  - No cystic structure found in the popliteal fossa.  LEFT: - No evidence of common femoral vein obstruction.  *See table(s) above for measurements and observations.    Preliminary     Procedures Procedures   Medications Ordered in ED Medications - No data to display  ED Course/ Medical Decision Making/ A&P                           Medical Decision Making Amount and/or Complexity of Data Reviewed ECG/medicine tests: ordered.   76 year old female presenting with a concern of a  blood clot.  Remote history of  DVT, she is no longer on blood thinners and does not remember where her blood clot was or what blood thinner she was on previously.  Physical exam not concerning for DVT.  Due to patient's concern, ultrasound ordered.  This was individually reviewed by me and I personally spoke with the radiologist and this is a negative finding.  She does have large amounts of varicose veins on physical exam.  She was educated on what varicose veins are and there potential risk for blood clots.  She was instructed to use compression stockings, continue to ambulate, and to elevate her legs.  She will follow-up with her primary care provider with any further concerns.  She and her husband are agreeable to this plan.  Discharge at this time.   Final Clinical Impression(s) / ED Diagnoses Final diagnoses:  Right leg pain  Varicose veins of both lower extremities with pain    Rx / DC Orders Results and diagnoses were explained to the patient. Return precautions discussed in full. Patient had no additional questions and expressed complete understanding.   This chart was dictated using voice recognition software.  Despite best efforts to proofread,  errors can occur which can change the documentation meaning.     Darliss Ridgel 06/06/21 1227    Isla Pence, MD 06/06/21 1228    Rhae Hammock, PA-C 06/06/21 1241    Isla Pence, MD 06/06/21 1505

## 2021-06-06 NOTE — ED Provider Triage Note (Signed)
Emergency Medicine Provider Triage Evaluation Note  Kim Mclaughlin , a 76 y.o. female  was evaluated in triage.  Pt is concerned that she has a blood clot due to swelling and tenderness to her right lower extremity.  No history of blood clot, no chest pain or shortness of breath.  No recent surgery, travel, estrogen products.  Does not smoke.  Review of Systems  See above  Physical Exam  BP (!) 163/77 (BP Location: Right Arm)    Pulse 84    Temp 98.2 F (36.8 C) (Oral)    Resp 16    SpO2 99%  Gen:   Awake, no distress   Resp:  Normal effort  MSK:   Moves extremities without difficulty  Other:  Patient with many varicose veins, pain worse with dorsiflexion.  No appreciable inflammation.  Strong pulses.  Medical Decision Making  Medically screening exam initiated at 10:45 AM.  Appropriate orders placed.  Kim Mclaughlin was informed that the remainder of the evaluation will be completed by another provider, this initial triage assessment does not replace that evaluation, and the importance of remaining in the ED until their evaluation is complete.    DVT study ordered from triage   Rhae Hammock, PA-C 06/06/21 1046

## 2021-06-06 NOTE — Discharge Instructions (Addendum)
You should buy compression stockings from [place], [facility], [facility] or any other store.  I have attached information about varicose veins to these discharge papers for you to learn more.  Also, please follow-up with your primary care provider about your leg discomfort.

## 2021-06-06 NOTE — Progress Notes (Signed)
Lower extremity venous RT study completed.  Preliminary results relayed to [place], PA.  See CV Proc for preliminary results report.   Darlin Coco, RDMS, RVT

## 2021-06-06 NOTE — ED Triage Notes (Signed)
Pt arrived via POV, c/o right sided leg/calf cramping and pain, worsening at night. States she had a hx of a blood clot in the past.

## 2021-06-18 ENCOUNTER — Ambulatory Visit (INDEPENDENT_AMBULATORY_CARE_PROVIDER_SITE_OTHER): Payer: Medicare PPO | Admitting: Family Medicine

## 2021-06-18 VITALS — BP 112/70 | HR 77 | Temp 98.4°F | Resp 18 | Ht 63.5 in | Wt 142.8 lb

## 2021-06-18 DIAGNOSIS — E2839 Other primary ovarian failure: Secondary | ICD-10-CM | POA: Diagnosis not present

## 2021-06-18 DIAGNOSIS — I83813 Varicose veins of bilateral lower extremities with pain: Secondary | ICD-10-CM

## 2021-06-18 MED ORDER — NONFORMULARY OR COMPOUNDED ITEM: 0 refills | Status: DC

## 2021-06-18 NOTE — Patient Instructions (Signed)

## 2021-06-18 NOTE — Progress Notes (Signed)
Subjective:   By signing my name below, I, [name], attest that this documentation has been prepared under the direction and in the presence of Ann Held, DO  06/18/2021    Patient ID: Kim Mclaughlin, female    DOB: 1946-04-06, 76 y.o.   MRN: [id number]  Chief Complaint  Patient presents with   ED visit f/u    Right leg pain. Pt states pain has improved compression stockings    HPI Patient is in today for a ED follow up.  Patient was admitted to the ED on 06/06/2021 for right leg pain and varicose veins of both lower extremities.  She reports the week prior she was experiencing a leg cramp that started from her ankles and went up to her knee. The cramps happened every night and she found relief after walking around a few minutes. On Friday the leg cramp was constant and she went to the emergency room on Saturday. She was found to have varicose veins and told to start wearing compression socks. She is interested in starting a intermittent fasting diet.  She is interested in completing her bone density scan. Her next appointment is after April, 2023.    Past Medical History:  Diagnosis Date   Allergy    Arthritis 07/2014   Right hand, prescribed prednisone-no longer taking. also Rt knww   Brain aneurysm    DVT (deep venous thrombosis) ([place]) 2015   very small; treated with compression and heat, no other problems since   GERD (gastroesophageal reflux disease)    patient denies states she does have hx of peptic ulcer many years   Hepatitis    in 10 th grade  (not sure type- not hep C) - treated  no other problems   Hyperlipidemia    diet controlled   Memory loss    MVP (mitral valve prolapse)    no problems, MD told patient she could not hear it   Osteoarthritis    Osteopenia    [place]   PONV (postoperative nausea and vomiting)    with ear surgery only - No other problems with other surgeries   Precancerous lesion 12/2014   Treated with cream only   TGA  (transient global amnesia)    Varicose veins    Vitamin D deficiency     Past Surgical History:  Procedure Laterality Date   APPENDECTOMY     BUNIONECTOMY WITH HAMMERTOE RECONSTRUCTION Left 04/22/2016   Procedure: MODIFIED MCBRIDE BUNIONECTOMY,  LEFT SECOND HAMMERTOE CORRECTION;  Surgeon: Wylene Simmer, MD;  Location: [facility];  Service: Orthopedics;  Laterality: Left;   COLONOSCOPY     07/2012 Olevia Perches - [facility]/father - polyps   colonoscopy with polypectomy  07/2012    hyperplastic;Dr Brodie   DILATION AND CURETTAGE OF UTERUS     DILATION AND CURETTAGE OF UTERUS N/A 09/03/2014   Procedure: DILATATION AND CURETTAGE;  Surgeon: Harle Battiest, MD;  Location[facility];  Service: Gynecology;  Laterality: N/A;   ENDOVENOUS ABLATION SAPHENOUS VEIN W/ LASER Left 01-31-2014   EVLA  left greater saphenous vein  by Curt Jews MD   ENDOVENOUS ABLATION SAPHENOUS VEIN W/ LASER Right 03-14-2014   endovenous laser ablation right greater saphenous vein  by Curt Jews MD   FOOT ARTHRODESIS Left 04/22/2016   Procedure: LEFT FIRST TARSAL METATARSAL ARTHRODESIS;  Surgeon: Wylene Simmer, MD;  Location: [place];  Service: Orthopedics;  Laterality: Left;   G 3 P 3  HYSTEROSCOPY WITH D & C N/A 03/25/2016   Procedure: DILATATION AND CURETTAGE /HYSTEROSCOPY;  Surgeon: Jerelyn Charles, MD;  Location: [facility];  Service: Gynecology;  Laterality: N/A;  WITH ULTRASOUND GUIDANCE   left ear surgery     STAPEDECTOMY     TARSAL METATARSAL FUSION WITH WEIL OSTEOTOMY Left 04/22/2016   Procedure: 2-4 TARSAL METATARSAL FUSION WITH WEIL OSTEOTOMY;  Surgeon: Wylene Simmer, MD;  Location: [place];  Service: Orthopedics;  Laterality: Left;   TUBAL LIGATION      Family History  Problem Relation Age of Onset   Pancreatic cancer Mother    Miscarriages / [place] Mother    Thyroid disease Mother        partial thyroidectomy ; S/P RAI   Heart attack Father        in  early 30s   Colon cancer Father    Seizures Sister    Ovarian cancer Maternal Aunt    Breast cancer Paternal Aunt    Aneurysm Paternal Uncle        brain   Colon cancer Paternal Grandmother    Stroke Paternal Grandmother        > 66   Heart attack Paternal Grandfather        in 47s   Cancer Other        among paternal sibs (colon, breast, bone)   Diabetes Neg Hx    Stomach cancer Neg Hx    Ulcerative colitis Neg Hx    Esophageal cancer Neg Hx    Rectal cancer Neg Hx     Social History   Socioeconomic History   Marital status: Married    Spouse name: [name]   Number of children: Not on file   Years of education: Not on file   Highest education level: Not on file  Occupational History   Occupation: retired  Tobacco Use   Smoking status: Former    Types: Cigarettes    Quit date: 04/27/1963    Years since quitting: 58.1   Smokeless tobacco: Never   Tobacco comments:    Smoked < 6 mos;< 1 pack total;quit 1965  Vaping Use   Vaping Use: Never used  Substance and Sexual Activity   Alcohol use: No   Drug use: No   Sexual activity: Yes    Birth control/protection: Post-menopausal  Other Topics Concern   Not on file  Social History Narrative   Lives with husband   Right Handed   Drinks average 2-3 cups caffeine daily   Social Determinants of Health   Financial Resource Strain: Low Risk    Difficulty of Paying Living Expenses: Not hard at all  Food Insecurity: No Food Insecurity   Worried About Charity fundraiser in the Last Year: Never true   [place] in the Last Year: Never true  Transportation Needs: No Transportation Needs   Lack of Transportation (Medical): No   Lack of Transportation (Non-Medical): No  Physical Activity: Inactive   Days of Exercise per Week: 0 days   Minutes of Exercise per Session: 0 min  Stress: No Stress Concern Present   Feeling of Stress : Not at all  Social Connections: Moderately Integrated   Frequency of Communication with  Friends and Family: More than three times a week   Frequency of Social Gatherings with Friends and Family: More than three times a week   Attends Religious Services: More than 4 times per year   Active Member of [employer]  or Organizations: No   Attends Archivist Meetings: Never   Marital Status: Married  Human resources officer Violence: Not At Risk   Fear of Current or Ex-Partner: No   Emotionally Abused: No   Physically Abused: No   Sexually Abused: No    Outpatient Medications Prior to Visit  Medication Sig Dispense Refill   Ascorbic Acid (VITAMIN C) 1000 MG tablet Take 1,000 mg by mouth daily.     atorvastatin (LIPITOR) 80 MG tablet Take 1 tablet (80 mg total) by mouth daily. 90 tablet 1   Bioflavonoid Products (QUERCETIN COMPLEX IMMUNE PO) Take 1 tablet by mouth in the morning, at noon, and at bedtime.     Boswellia-Glucosamine-Vit D (OSTEO BI-FLEX ONE PER DAY PO) Take 1 tablet by mouth 2 (two) times daily.     cetirizine (ZYRTEC) 10 MG tablet Take 10 mg by mouth daily as needed for allergies.     Cholecalciferol (VITAMIN D-3) 25 MCG (1000 UT) CAPS Take 1 capsule by mouth 2 (two) times daily.     Glucosamine HCl-MSM (MSM GLUCOSAMINE PO) Take 2 tablets by mouth daily.     Omega-3 Fatty Acids (FISH OIL) 1000 MG CAPS Take 1 capsule by mouth daily.      OVER THE COUNTER MEDICATION Take 1 tablet by mouth 3 (three) times daily. Bone strength     QUERCETIN PO Take 1 tablet by mouth daily.     triamcinolone (NASACORT) 55 MCG/ACT AERO nasal inhaler Place 2 sprays into the nose daily as needed (allergies).      triamcinolone cream (KENALOG) 0.1 % Apply 1 application topically 2 (two) times daily as needed (eczema.).      Ubiquinol 100 MG CAPS Take 1 capsule by mouth 2 (two) times daily.     Zinc Gluconate 30 MG TABS Take 30 mg by mouth daily.     benzonatate (TESSALON) 200 MG capsule Take 1 capsule (200 mg total) by mouth 2 (two) times daily as needed for cough. 20 capsule 0   No  facility-administered medications prior to visit.    Allergies  Allergen Reactions   Macrobid  [Nitrofurantoin Macrocrystal] Other (See Comments)   Doxycycline Hives   Pravastatin Sodium     REACTION: shin pain   Amoxicillin-Pot Clavulanate     GI symptoms; abdominal pain & nausea NOTE: able to take Ampicillin   Augmentin [Amoxicillin-Pot Clavulanate] Nausea Only    Upsets stomach   Codeine Nausea Only   Methylphenidate Hcl Palpitations   Methylprednisolone Rash    Rash and redness on face   Sulfamethoxazole-Trimethoprim Nausea Only     nausea    Review of Systems  Musculoskeletal:        (+)right lower leg pain      Objective:    Physical Exam Constitutional:      General: She is not in acute distress.    Appearance: Normal appearance. She is not ill-appearing.  HENT:     Head: Normocephalic and atraumatic.     Right Ear: External ear normal.     Left Ear: External ear normal.  Eyes:     Extraocular Movements: Extraocular movements intact.     Pupils: Pupils are equal, round, and reactive to light.  Cardiovascular:     Rate and Rhythm: Normal rate and regular rhythm.     Heart sounds: Normal heart sounds. No murmur heard.   No gallop.  Pulmonary:     Effort: Pulmonary effort is normal. No respiratory distress.  Breath sounds: Normal breath sounds. No wheezing or rales.  Skin:    General: Skin is warm and dry.  Neurological:     Mental Status: She is alert and oriented to person, place, and time.  Psychiatric:        Behavior: Behavior normal.        Judgment: Judgment normal.    BP 112/70 (BP Location: Left Arm, Patient Position: Sitting, Cuff Size: Normal)    Pulse 77    Temp 98.4 F (36.9 C) (Oral)    Resp 18    Ht 5' 3.5" (1.613 m)    Wt 142 lb 12.8 oz (64.8 kg)    SpO2 98%    BMI 24.90 kg/m  Wt Readings from Last 3 Encounters:  06/18/21 142 lb 12.8 oz (64.8 kg)  04/16/21 143 lb (64.9 kg)  04/08/21 145 lb (65.8 kg)    Diabetic Foot Exam - Simple    No data filed    Lab Results  Component Value Date   WBC 6.4 11/14/2020   HGB 13.8 11/14/2020   HCT 42.5 11/14/2020   PLT 283.0 11/14/2020   GLUCOSE 89 04/16/2021   CHOL 128 04/16/2021   TRIG 45.0 04/16/2021   HDL 76.50 04/16/2021   LDLDIRECT 188.0 08/08/2012   LDLCALC 42 04/16/2021   ALT 27 04/16/2021   AST 27 04/16/2021   NA 141 04/16/2021   K 4.5 04/16/2021   CL 106 04/16/2021   CREATININE 0.79 04/16/2021   BUN 13 04/16/2021   CO2 30 04/16/2021   TSH 1.302 05/01/2020   INR 1.0 05/01/2020   HGBA1C 5.7 (H) 05/02/2020    Lab Results  Component Value Date   TSH 1.302 05/01/2020   Lab Results  Component Value Date   WBC 6.4 11/14/2020   HGB 13.8 11/14/2020   HCT 42.5 11/14/2020   MCV 94.3 11/14/2020   PLT 283.0 11/14/2020   Lab Results  Component Value Date   NA 141 04/16/2021   K 4.5 04/16/2021   CO2 30 04/16/2021   GLUCOSE 89 04/16/2021   BUN 13 04/16/2021   CREATININE 0.79 04/16/2021   BILITOT 0.7 04/16/2021   ALKPHOS 82 04/16/2021   AST 27 04/16/2021   ALT 27 04/16/2021   PROT 6.3 04/16/2021   ALBUMIN 4.0 04/16/2021   CALCIUM 9.6 04/16/2021   ANIONGAP 11 05/02/2020   GFR 72.92 04/16/2021   Lab Results  Component Value Date   CHOL 128 04/16/2021   Lab Results  Component Value Date   HDL 76.50 04/16/2021   Lab Results  Component Value Date   LDLCALC 42 04/16/2021   Lab Results  Component Value Date   TRIG 45.0 04/16/2021   Lab Results  Component Value Date   CHOLHDL 2 04/16/2021   Lab Results  Component Value Date   HGBA1C 5.7 (H) 05/02/2020       Assessment & Plan:   Problem List Items Addressed This Visit       Unprioritized   Varicose veins of both lower extremities with pain - Primary    Pt wants to hold off on vascular surgeon She will con't to wear compression and call if symptoms worsen       Relevant Medications   NONFORMULARY OR COMPOUNDED ITEM   Other Visit Diagnoses     Estrogen deficiency        Relevant Orders   DG Bone Density        Meds ordered this encounter  Medications  NONFORMULARY OR COMPOUNDED ITEM    Sig: compression socks 20-30 mm/ hg    Dispense:  1 each    Refill:  0    I, Ann Held, DO, personally preformed the services described in this documentation.  All medical record entries made by the scribe were at my direction and in my presence.  I have reviewed the chart and discharge instructions (if applicable) and agree that the record reflects my personal performance and is accurate and complete. 06/18/2021   I,[name],acting as a scribe for Ann Held, DO.,have documented all relevant documentation on the behalf of Ann Held, DO,as directed by  Ann Held, DO while in the presence of Ann Held, DO.   Ann Held, DO

## 2021-06-21 ENCOUNTER — Encounter: Payer: Self-pay | Admitting: Family Medicine

## 2021-06-21 DIAGNOSIS — I83813 Varicose veins of bilateral lower extremities with pain: Secondary | ICD-10-CM | POA: Insufficient documentation

## 2021-06-21 NOTE — Assessment & Plan Note (Signed)
Pt wants to hold off on vascular surgeon She will con't to wear compression and call if symptoms worsen

## 2021-07-01 ENCOUNTER — Ambulatory Visit (INDEPENDENT_AMBULATORY_CARE_PROVIDER_SITE_OTHER): Payer: Medicare PPO | Admitting: Podiatry

## 2021-07-01 ENCOUNTER — Ambulatory Visit (INDEPENDENT_AMBULATORY_CARE_PROVIDER_SITE_OTHER): Payer: Medicare PPO

## 2021-07-01 ENCOUNTER — Other Ambulatory Visit: Payer: Self-pay

## 2021-07-01 DIAGNOSIS — M2041 Other hammer toe(s) (acquired), right foot: Secondary | ICD-10-CM

## 2021-07-01 DIAGNOSIS — L309 Dermatitis, unspecified: Secondary | ICD-10-CM

## 2021-07-01 DIAGNOSIS — L84 Corns and callosities: Secondary | ICD-10-CM

## 2021-07-01 DIAGNOSIS — M21612 Bunion of left foot: Secondary | ICD-10-CM | POA: Diagnosis not present

## 2021-07-01 DIAGNOSIS — M21619 Bunion of unspecified foot: Secondary | ICD-10-CM | POA: Diagnosis not present

## 2021-07-01 DIAGNOSIS — M21611 Bunion of right foot: Secondary | ICD-10-CM

## 2021-07-01 DIAGNOSIS — M7751 Other enthesopathy of right foot: Secondary | ICD-10-CM

## 2021-07-01 NOTE — Progress Notes (Signed)
Subjective:  ? ?Patient ID: Kim Mclaughlin, female   DOB: 76 y.o.   MRN: [id number]  ? ?HPI ?Patient presents with chronic lesions between the second and third right foot that are painful history of bunion correction bilateral with lesion also on the plantar aspect of the left hip sore.  States that at 1 point in the future she knows she needs surgery but she is trying to do what she can do to avoid it and does have a trip coming up in several months.  Patient presents with caregiver does not smoke and likes to be active ? ? ?Review of Systems  ?All other systems reviewed and are negative. ? ? ?   ?Objective:  ?Physical Exam ?Vitals and nursing note reviewed.  ?Constitutional:   ?   Appearance: She is well-developed.  ?Pulmonary:  ?   Effort: Pulmonary effort is normal.  ?Musculoskeletal:     ?   General: Normal range of motion.  ?Skin: ?   General: Skin is warm.  ?Neurological:  ?   Mental Status: She is alert.  ?  ?Neurovascular status intact muscle strength found to be adequate range of motion was within normal limits.  Patient is noted to have moderate structural bunion deformity right compression between the hallux second and third digit with inflammation lateral side second digit proximal phalanx and lesions between the toes and subsecond metatarsal of the left foot.  Surgeries been done on both but it appears to be different types of procedure bilateral.  Good digital perfusion well oriented x3 ? ?   ?Assessment:  ?Inflammatory capsulitis digit to right lateral side keratotic lesion structural deformity right over left history of surgery bilateral  ? ?   ?Plan:  ?H&P all conditions reviewed discussed and for the right I did do sterile prep injected the inner phalangeal joint 2 mg dexamethasone Kenalog 5 mg Xylocaine I debrided lesion second and third toe right subsecond metatarsal left discussed possible surgical intervention in future but would like to avoid and applied cushioning to the second toe right  foot.  I did discuss possibility for hammertoe repair or possibility for redo of bunion deformity right ? ?X-rays indicate on the right distal osteotomy was done on the left a Lapidus fusion was done with digital fusions and shortening osteotomies bilateral ?   ? ? ?

## 2021-07-31 DIAGNOSIS — Z20822 Contact with and (suspected) exposure to covid-19: Secondary | ICD-10-CM | POA: Diagnosis not present

## 2021-08-01 ENCOUNTER — Encounter: Payer: Self-pay | Admitting: Family Medicine

## 2021-08-02 ENCOUNTER — Encounter: Payer: Self-pay | Admitting: Family Medicine

## 2021-08-07 DIAGNOSIS — M85851 Other specified disorders of bone density and structure, right thigh: Secondary | ICD-10-CM | POA: Diagnosis not present

## 2021-08-07 LAB — HM DEXA SCAN

## 2021-09-24 DIAGNOSIS — M1711 Unilateral primary osteoarthritis, right knee: Secondary | ICD-10-CM | POA: Diagnosis not present

## 2021-09-30 ENCOUNTER — Encounter (HOSPITAL_COMMUNITY): Payer: Self-pay

## 2021-09-30 ENCOUNTER — Observation Stay (HOSPITAL_COMMUNITY)
Admission: EM | Admit: 2021-09-30 | Discharge: 2021-10-01 | Disposition: A | Discharge status: Home / Self Care | Payer: Medicare PPO | Attending: Family Medicine | Admitting: Family Medicine

## 2021-09-30 ENCOUNTER — Emergency Department (HOSPITAL_COMMUNITY): Payer: Medicare PPO

## 2021-09-30 ENCOUNTER — Emergency Department (HOSPITAL_BASED_OUTPATIENT_CLINIC_OR_DEPARTMENT_OTHER)
Admit: 2021-09-30 | Discharge: 2021-09-30 | Disposition: A | Discharge status: Home / Self Care | Payer: Medicare PPO | Attending: Neurology | Admitting: Neurology

## 2021-09-30 ENCOUNTER — Other Ambulatory Visit: Payer: Self-pay

## 2021-09-30 DIAGNOSIS — R4182 Altered mental status, unspecified: Secondary | ICD-10-CM | POA: Diagnosis not present

## 2021-09-30 DIAGNOSIS — G454 Transient global amnesia: Secondary | ICD-10-CM

## 2021-09-30 DIAGNOSIS — Z86718 Personal history of other venous thrombosis and embolism: Secondary | ICD-10-CM | POA: Insufficient documentation

## 2021-09-30 DIAGNOSIS — Z79899 Other long term (current) drug therapy: Secondary | ICD-10-CM | POA: Diagnosis not present

## 2021-09-30 DIAGNOSIS — R413 Other amnesia: Secondary | ICD-10-CM | POA: Diagnosis not present

## 2021-09-30 DIAGNOSIS — Z87891 Personal history of nicotine dependence: Secondary | ICD-10-CM | POA: Diagnosis not present

## 2021-09-30 DIAGNOSIS — G4733 Obstructive sleep apnea (adult) (pediatric): Secondary | ICD-10-CM | POA: Diagnosis not present

## 2021-09-30 DIAGNOSIS — R410 Disorientation, unspecified: Secondary | ICD-10-CM | POA: Diagnosis present

## 2021-09-30 LAB — COMPREHENSIVE METABOLIC PANEL
ALT: 34 U/L (ref 0–44)
AST: 29 U/L (ref 15–41)
Albumin: 4.5 g/dL (ref 3.5–5.0)
Alkaline Phosphatase: 81 U/L (ref 38–126)
Anion gap: 13 (ref 5–15)
BUN: 16 mg/dL (ref 8–23)
CO2: 23 mmol/L (ref 22–32)
Calcium: 10.5 mg/dL — ABNORMAL HIGH (ref 8.9–10.3)
Chloride: 106 mmol/L (ref 98–111)
Creatinine, Ser: 0.89 mg/dL (ref 0.44–1.00)
GFR, Estimated: >60 mL/min (ref >60)
Glucose, Bld: 119 mg/dL — ABNORMAL HIGH (ref 70–99)
Potassium: 3.6 mmol/L (ref 3.5–5.1)
Sodium: 142 mmol/L (ref 135–145)
Total Bilirubin: 1.1 mg/dL (ref 0.3–1.2)
Total Protein: 8.2 g/dL — ABNORMAL HIGH (ref 6.5–8.1)

## 2021-09-30 LAB — CBC
HCT: 44.8 % (ref 36.0–46.0)
Hemoglobin: 14.9 g/dL (ref 12.0–15.0)
MCH: 31.4 pg (ref 26.0–34.0)
MCHC: 33.3 g/dL (ref 30.0–36.0)
MCV: 94.5 fL (ref 80.0–100.0)
Platelets: 339 10*3/uL (ref 150–400)
RBC: 4.74 MIL/uL (ref 3.87–5.11)
RDW: 13.1 % (ref 11.5–15.5)
WBC: 8.6 10*3/uL (ref 4.0–10.5)
nRBC: 0 % (ref 0.0–0.2)

## 2021-09-30 LAB — URINALYSIS, ROUTINE W REFLEX MICROSCOPIC
Bacteria, UA: NONE SEEN
Bilirubin Urine: NEGATIVE
Glucose, UA: NEGATIVE mg/dL
Ketones, ur: 5 mg/dL — AB
Leukocytes,Ua: NEGATIVE
Nitrite: NEGATIVE
Protein, ur: NEGATIVE mg/dL
Specific Gravity, Urine: 1.006 (ref 1.005–1.030)
pH: 6 (ref 5.0–8.0)

## 2021-09-30 LAB — CBG MONITORING, ED: Glucose-Capillary: 121 mg/dL — ABNORMAL HIGH (ref 70–99)

## 2021-09-30 NOTE — Consult Note (Signed)
Neurology Consultation Reason for Consult: Memory difficulty Referring Physician: Horton, K  CC: " I do not know why I am here"  History is obtained from: Patient, husband  HPI: Kim Mclaughlin is a 76 y.o. female who was in her normal state of health throughout the morning.  She had a hairdresser's appointment at 10 she has no memory of going to the hairdresser.  Her husband states that she has forgotten some of the things that she has done over the past couple of days as well.  Her husband received a call from the hairdresser saying that she was confused, and when he went and found her, she did not remember multiple events from this morning.  She has since been repeating the same questions over and over again, he estimates 15-20 times that she has asked him why she is in the hospital.  I introduced myself, and we had a conversation and then I stepped out of the room for a few minutes, when I came back and she had no recollection of having met me.    ROS: A 14 point ROS was performed and is negative except as noted in the HPI.   Past Medical History:  Diagnosis Date   Allergy    Arthritis 07/2014   Right hand, prescribed prednisone-no longer taking. also Rt knww   Brain aneurysm    DVT (deep venous thrombosis) ([place]) 2015   very small; treated with compression and heat, no other problems since   GERD (gastroesophageal reflux disease)    patient denies states she does have hx of peptic ulcer many years   Hepatitis    in 10 th grade  (not sure type- not hep C) - treated  no other problems   Hyperlipidemia    diet controlled   Memory loss    MVP (mitral valve prolapse)    no problems, MD told patient she could not hear it   Osteoarthritis    Osteopenia    [facility]   PONV (postoperative nausea and vomiting)    with ear surgery only - No other problems with other surgeries   Precancerous lesion 12/2014   Treated with cream only   TGA (transient global amnesia)    Varicose veins     Vitamin D deficiency      Family History  Problem Relation Age of Onset   Pancreatic cancer Mother    Miscarriages / [place] Mother    Thyroid disease Mother        partial thyroidectomy ; S/P RAI   Heart attack Father        in early 35s   Colon cancer Father    Seizures Sister    Ovarian cancer Maternal Aunt    Breast cancer Paternal Aunt    Aneurysm Paternal Uncle        brain   Colon cancer Paternal Grandmother    Stroke Paternal Grandmother        > 41   Heart attack Paternal Grandfather        in 38s   Cancer Other        among paternal sibs (colon, breast, bone)   Diabetes Neg Hx    Stomach cancer Neg Hx    Ulcerative colitis Neg Hx    Esophageal cancer Neg Hx    Rectal cancer Neg Hx      Social History:  reports that she quit smoking about 58 years ago. Her smoking use included cigarettes. She has never used  smokeless tobacco. She reports that she does not drink alcohol and does not use drugs.   Exam: Current vital signs: BP (!) 179/76 (BP Location: Left Arm)   Pulse 88   Temp 97.8 F (36.6 C) (Oral)   Resp 18   Ht 5' 4.5" (1.638 m)   SpO2 98%   BMI 24.13 kg/m  Vital signs in last 24 hours: Temp:  [97.8 F (36.6 C)] 97.8 F (36.6 C) (06/07 1155) Pulse Rate:  [88] 88 (06/07 1155) Resp:  [18] 18 (06/07 1155) BP: (179)/(76) 179/76 (06/07 1155) SpO2:  [98 %] 98 % (06/07 1155)   Physical Exam  Constitutional: Appears well-developed and well-nourished.  Psych: Affect appropriate to situation Eyes: No scleral injection HENT: No OP obstruction MSK: no joint deformities.  Cardiovascular: Normal rate and regular rhythm.  Respiratory: Effort normal, non-labored breathing GI: Soft.  No distension. There is no tenderness.  Skin: WDI  Neuro: Mental Status: Patient is awake, alert, she is able to spell world backwards without difficulty, able to give me the number of quarters in $2.75  patient is able to give a clear and coherent history, with  the exception of the events of this morning. No signs of aphasia or neglect Cranial Nerves: II: Visual Fields are full. Pupils are equal, round, and reactive to light.   III,IV, VI: EOMI without ptosis or diploplia.  V: Facial sensation is symmetric to temperature VII: Facial movement is symmetric.  VIII: hearing is intact to voice X: Uvula elevates symmetrically XI: Shoulder shrug is symmetric. XII: tongue is midline without atrophy or fasciculations.  Motor: Tone is normal. Bulk is normal. 5/5 strength was present in all four extremities.  Sensory: Sensation is symmetric to light touch and temperature in the arms and legs. Cerebellar: FNF intact bilaterally   I have reviewed labs in epic and the results pertinent to this consultation are: Creatinine 0.89  I have reviewed the images obtained: MRI and CTA from 04/2020- negative  Impression: 76 year old female with presentation that is classically consistent with transient global amnesia.  TGA is recurrent in a small percentage of patients, and I strongly suspect that that is what we are dealing with.  Given the recurrence, however, I do think a repeat EEG would be prudent and given that she is still symptomatic will need to be observed for a few hours at least in the emergency department I think going ahead and getting an EEG while she is symptomatic would be helpful.  Recommendations: 1) EEG 2) if EEG is negative and the patient is forming new memories,then she could be discharged 3) if EEG is abnormal, or if the patient does not begin forming new memories, then I would admit her for observation.  Roland Rack, MD [facility] [phone]  If 7pm- 7am, please page neurology on call as listed in [place].

## 2021-09-30 NOTE — ED Provider Triage Note (Signed)
Emergency Medicine Provider Triage Evaluation Note  Kim Mclaughlin , a 76 y.o. female  was evaluated in triage.  Pt complains of memory loss. She presents with her husband who provides majority of history. Husband states that this morning the patient woke up and was acting normally. He states that she went to the hairdresser at 10 am this morning and around 10:30 he got a call from the hairdresser stating that the patient was confused. She is currently unable to remember anything that happened recently. She is able to identify her husband and herself. She currently has no complaints and denies, fever, chills, vision changes, headache, chest pain, or shortness of breath.  Apparently, according to chart review, patient had a very similar episode in 1/22 and had CT head, CTA head and neck, CT perfusion, MRI, and EEG performed all of which were normal. She then followed up with neuro Dr. Leonie Man who diagnosed her with transient global amnesia.  Review of Systems  Positive:  Negative:   Physical Exam  BP (!) 179/76 (BP Location: Left Arm)   Pulse 88   Temp 97.8 F (36.6 C) (Oral)   Resp 18   Ht 5' 4.5" (1.638 m)   SpO2 98%   BMI 24.13 kg/m  Gen:   Awake, no distress   Resp:  Normal effort  MSK:   Moves extremities without difficulty  Other:  Alert to self only, no focal deficits  Medical Decision Making  Medically screening exam initiated at 12:32 PM.  Appropriate orders placed.  PHYLLICIA DUDEK was informed that the remainder of the evaluation will be completed by another provider, this initial triage assessment does not replace that evaluation, and the importance of remaining in the ED until their evaluation is complete.     Bud Face, PA-C 09/30/21 1239

## 2021-09-30 NOTE — ED Triage Notes (Signed)
Pts husband brought pt to ER due to memory loss. Pt went to hairdressers this morning early and went to her car to wait when she went back in the hairdressers she forgot why she was there. Pt A&O to self and general surroundings.

## 2021-09-30 NOTE — Procedures (Signed)
Patient Name: Kim Mclaughlin  MRN: [id number]  Epilepsy Attending: Lora Havens  Referring Physician/Provider: Lorelle Gibbs, DO Date: 09/30/2021 Duration: 22.39 mins  Patient history:  76 year old female with presentation that is classically consistent with transient global amnesia.  EEG to evaluate for seizure  Level of alertness: Awake, drowsy  AEDs during EEG study: None  Technical aspects: This EEG study was done with scalp electrodes positioned according to the 10-20 International system of electrode placement. Electrical activity was acquired at a sampling rate of '500Hz'$  and reviewed with a high frequency filter of '70Hz'$  and a low frequency filter of '1Hz'$ . EEG data were recorded continuously and digitally stored.   Description: The posterior dominant rhythm consists of 8 Hz activity of moderate voltage (25-35 uV) seen predominantly in posterior head regions, symmetric and reactive to eye opening and eye closing. Drowsiness was characterized by attenuation of the posterior background rhythm. Hyperventilation and photic stimulation were not performed.     IMPRESSION: This study is within normal limits. No seizures or epileptiform discharges were seen throughout the recording.  Margaruite Top [name]

## 2021-09-30 NOTE — H&P (Signed)
TRH H&P    Patient Demographics:    Kim Mclaughlin, is a 76 y.o. female  MRN: [id number]  DOB - [birth year]  Admit Date - 09/30/2021  Referring MD/NP/PA: Joaquin Music  Outpatient Primary MD for the patient is Carollee Herter, Alferd Apa, DO  Patient coming from: Home  Chief complaint-confusion   HPI:    Kim Mclaughlin  is a 76 y.o. female, with history of osteoarthritis, hyperlipidemia, documented previous history of transient global amnesia was brought to the hospital for confusion.  Patient was normal around 10 AM this morning.  Patient left to go to hairdresser appointment at which time hairdresser noticed that patient was confused.  They called patient's sister who confirmed that patient sounded change from baseline and has been went to pick patient from the hairdresser.  Patient cannot recall certain episodes in the morning.  She was having repetitive questioning about this morning. In the ED, CT head was unremarkable.  Neurology was consulted, neurology recommended to repeat EEG.  EEG was done which was unremarkable.  No further imaging recommended by neurology.  As patient was not back to baseline then tried hospitalist was called for admission for observation.  At this time patient is answering questions appropriately, she denies nausea vomiting diarrhea.  No chest pain or shortness of breath.  No abdominal pain. She had no focal numbness or weakness of extremities.  No blurred vision.  Did not pass out.    Review of systems:    In addition to the HPI above,    All other systems reviewed and are negative.    Past History of the following :    Past Medical History:  Diagnosis Date   Allergy    Arthritis 07/2014   Right hand, prescribed prednisone-no longer taking. also Rt knww   Brain aneurysm    DVT (deep venous thrombosis) ([place]) 2015   very small; treated with compression and heat, no other  problems since   GERD (gastroesophageal reflux disease)    patient denies states she does have hx of peptic ulcer many years   Hepatitis    in 10 th grade  (not sure type- not hep C) - treated  no other problems   Hyperlipidemia    diet controlled   Memory loss    MVP (mitral valve prolapse)    no problems, MD told patient she could not hear it   Osteoarthritis    Osteopenia    [facility]   PONV (postoperative nausea and vomiting)    with ear surgery only - No other problems with other surgeries   Precancerous lesion 12/2014   Treated with cream only   TGA (transient global amnesia)    Varicose veins    Vitamin D deficiency       Past Surgical History:  Procedure Laterality Date   APPENDECTOMY     BUNIONECTOMY WITH HAMMERTOE RECONSTRUCTION Left 04/22/2016   Procedure: MODIFIED MCBRIDE BUNIONECTOMY,  LEFT SECOND HAMMERTOE CORRECTION;  Surgeon: Wylene Simmer, MD;  Location: [facility];  Service: Orthopedics;  Laterality:  Left;   COLONOSCOPY     07/2012 Olevia Perches - [facility]/father - polyps   colonoscopy with polypectomy  07/2012    hyperplastic;Dr Brodie   DILATION AND CURETTAGE OF UTERUS     DILATION AND CURETTAGE OF UTERUS N/A 09/03/2014   Procedure: DILATATION AND CURETTAGE;  Surgeon: Harle Battiest, MD;  [facility];  Service: Gynecology;  Laterality: N/A;   ENDOVENOUS ABLATION SAPHENOUS VEIN W/ LASER Left 01-31-2014   EVLA  left greater saphenous vein  by Curt Jews MD   ENDOVENOUS ABLATION SAPHENOUS VEIN W/ LASER Right 03-14-2014   endovenous laser ablation right greater saphenous vein  by Curt Jews MD   FOOT ARTHRODESIS Left 04/22/2016   Procedure: LEFT FIRST TARSAL METATARSAL ARTHRODESIS;  Surgeon: Wylene Simmer, MD;  Location: [facility];  Service: Orthopedics;  Laterality: Left;   G 3 P 3     HYSTEROSCOPY WITH D & C N/A 03/25/2016   Procedure: DILATATION AND CURETTAGE /HYSTEROSCOPY;  Surgeon: Jerelyn Charles, MD;  Location: [facility];   Service: Gynecology;  Laterality: N/A;  WITH ULTRASOUND GUIDANCE   left ear surgery     STAPEDECTOMY     TARSAL METATARSAL FUSION WITH WEIL OSTEOTOMY Left 04/22/2016   Procedure: 2-4 TARSAL METATARSAL FUSION WITH WEIL OSTEOTOMY;  Surgeon: Wylene Simmer, MD;  Location: [facility];  Service: Orthopedics;  Laterality: Left;   TUBAL LIGATION        Social History:      Social History   Tobacco Use   Smoking status: Former    Types: Cigarettes    Quit date: 04/27/1963    Years since quitting: 58.4   Smokeless tobacco: Never   Tobacco comments:    Smoked < 6 mos;< 1 pack total;quit 1965  Substance Use Topics   Alcohol use: No       Family History :     Family History  Problem Relation Age of Onset   Pancreatic cancer Mother    Miscarriages / [place] Mother    Thyroid disease Mother        partial thyroidectomy ; S/P RAI   Heart attack Father        in early 80s   Colon cancer Father    Seizures Sister    Ovarian cancer Maternal Aunt    Breast cancer Paternal Aunt    Aneurysm Paternal Uncle        brain   Colon cancer Paternal Grandmother    Stroke Paternal Grandmother        > 80   Heart attack Paternal Grandfather        in 85s   Cancer Other        among paternal sibs (colon, breast, bone)   Diabetes Neg Hx    Stomach cancer Neg Hx    Ulcerative colitis Neg Hx    Esophageal cancer Neg Hx    Rectal cancer Neg Hx       Home Medications:   Prior to Admission medications   Medication Sig Start Date End Date Taking? Authorizing Provider  Ascorbic Acid (VITAMIN C) 1000 MG tablet Take 1,000 mg by mouth daily.   Yes [provider]  atorvastatin (LIPITOR) 80 MG tablet Take 1 tablet (80 mg total) by mouth daily. 04/16/21  Yes Ann Held, DO  Bioflavonoid Products (QUERCETIN COMPLEX IMMUNE PO) Take 1 tablet by mouth in the morning, at noon, and at bedtime.   Yes [provider]  Boswellia-Glucosamine-Vit  D (OSTEO BI-FLEX  ONE PER DAY PO) Take 1 tablet by mouth 2 (two) times daily.   Yes [provider]  cetirizine (ZYRTEC) 10 MG tablet Take 10 mg by mouth daily as needed for allergies.   Yes [provider]  Cholecalciferol (VITAMIN D-3) 25 MCG (1000 UT) CAPS Take 1 capsule by mouth 2 (two) times daily.   Yes [provider]  Glucosamine HCl-MSM (MSM GLUCOSAMINE PO) Take 2 tablets by mouth daily.   Yes [provider]  Multiple Vitamins-Minerals (MULTIVITAMIN ADULTS 50+) TABS Take 1 tablet by mouth daily.   Yes [provider]  Omega-3 Fatty Acids (FISH OIL) 1000 MG CAPS Take 2 capsules by mouth daily.   Yes [provider]  OVER THE COUNTER MEDICATION Take 1 tablet by mouth 3 (three) times daily. Bone strength   Yes [provider]  triamcinolone (NASACORT) 55 MCG/ACT AERO nasal inhaler Place 2 sprays into the nose daily as needed (allergies).    Yes [provider]  triamcinolone cream (KENALOG) 0.1 % Apply 1 application topically 2 (two) times daily as needed (eczema.).    Yes [provider]  Ubiquinol 100 MG CAPS Take 1 capsule by mouth 2 (two) times daily.   Yes [provider]  Zinc Gluconate 30 MG TABS Take 30 mg by mouth daily.   Yes [provider]  NONFORMULARY OR COMPOUNDED ITEM compression socks 20-30 mm/ hg 06/18/21   Ann Held, DO     Allergies:     Allergies  Allergen Reactions   Macrobid  [Nitrofurantoin Macrocrystal] Other (See Comments)   Doxycycline Hives   Pravastatin Sodium     REACTION: shin pain   Amoxicillin-Pot Clavulanate     GI symptoms; abdominal pain & nausea NOTE: able to take Ampicillin   Augmentin [Amoxicillin-Pot Clavulanate] Nausea Only    Upsets stomach   Codeine Nausea Only   Methylphenidate Hcl Palpitations   Methylprednisolone Rash    Rash and redness on face   Sulfamethoxazole-Trimethoprim Nausea Only     nausea     Physical Exam:   Vitals  Blood  pressure (!) 144/74, pulse 77, temperature 97.8 F (36.6 C), temperature source Oral, resp. rate 18, height 5' 4.5" (1.638 m), SpO2 99 %.  1.  General: Appears in no acute distress  2. Psychiatric: S1-S2, regular, no murmur auscultated  3. Neurologic: Cranial nerves II through XII grossly intact, no focal deficit noted, moving all extremities  4. HEENMT:  Atraumatic normocephalic, extraocular muscles are intact  5. Respiratory : Clear to auscultation bilaterally, no wheezing or crackles auscultated  6. Cardiovascular : S1-S2, regular, no murmur auscultated  7. Gastrointestinal:  Abdomen is soft, nontender, no organomegaly  8. Skin:  No rash noted     Data Review:    CBC Recent Labs  Lab 09/30/21 1221  WBC 8.6  HGB 14.9  HCT 44.8  PLT 339  MCV 94.5  MCH 31.4  MCHC 33.3  RDW 13.1   ------------------------------------------------------------------------------------------------------------------  Results for orders placed or performed during the hospital encounter of 09/30/21 (from the past 48 hour(s))  CBG monitoring, ED     Status: Abnormal   Collection Time: 09/30/21 12:20 PM  Result Value Ref Range   Glucose-Capillary 121 (H) 70 - 99 mg/dL    Comment: Glucose reference range applies only to samples taken after fasting for at least 8 hours.  Comprehensive metabolic panel     Status: Abnormal   Collection Time: 09/30/21 12:21  PM  Result Value Ref Range   Sodium 142 135 - 145 mmol/L   Potassium 3.6 3.5 - 5.1 mmol/L   Chloride 106 98 - 111 mmol/L   CO2 23 22 - 32 mmol/L   Glucose, Bld 119 (H) 70 - 99 mg/dL    Comment: Glucose reference range applies only to samples taken after fasting for at least 8 hours.   BUN 16 8 - 23 mg/dL   Creatinine, Ser 0.89 0.44 - 1.00 mg/dL   Calcium 10.5 (H) 8.9 - 10.3 mg/dL   Total Protein 8.2 (H) 6.5 - 8.1 g/dL   Albumin 4.5 3.5 - 5.0 g/dL   AST 29 15 - 41 U/L   ALT 34 0 - 44 U/L   Alkaline Phosphatase 81 38 - 126 U/L    Total Bilirubin 1.1 0.3 - 1.2 mg/dL   GFR, Estimated >60 >60 mL/min    Comment: (NOTE) Calculated using the CKD-EPI Creatinine Equation (2021)    Anion gap 13 5 - 15    Comment: Performed at [facility], [place] [address], [place], [place] [address]  CBC     Status: None   Collection Time: 09/30/21 12:21 PM  Result Value Ref Range   WBC 8.6 4.0 - 10.5 K/uL   RBC 4.74 3.87 - 5.11 MIL/uL   Hemoglobin 14.9 12.0 - 15.0 g/dL   HCT 44.8 36.0 - 46.0 %   MCV 94.5 80.0 - 100.0 fL   MCH 31.4 26.0 - 34.0 pg   MCHC 33.3 30.0 - 36.0 g/dL   RDW 13.1 11.5 - 15.5 %   Platelets 339 150 - 400 K/uL   nRBC 0.0 0.0 - 0.2 %    Comment: Performed at [facility], [place] [address] [place], [place] [address]    Chemistries  Recent Labs  Lab 09/30/21 1221  NA 142  K 3.6  CL 106  CO2 23  GLUCOSE 119*  BUN 16  CREATININE 0.89  CALCIUM 10.5*  AST 29  ALT 34  ALKPHOS 81  BILITOT 1.1   ------------------------------------------------------------------------------------------------------------------  ------------------------------------------------------------------------------------------------------------------ GFR: CrCl cannot be calculated (Unknown ideal weight.). Liver Function Tests: Recent Labs  Lab 09/30/21 1221  AST 29  ALT 34  ALKPHOS 81  BILITOT 1.1  PROT 8.2*  ALBUMIN 4.5   No results for input(s): LIPASE, AMYLASE in the last 168 hours. No results for input(s): AMMONIA in the last 168 hours. Coagulation Profile: No results for input(s): INR, PROTIME in the last 168 hours. Cardiac Enzymes: No results for input(s): CKTOTAL, CKMB, CKMBINDEX, TROPONINI in the last 168 hours. BNP (last 3 results) No results for input(s): PROBNP in the last 8760 hours. HbA1C: No results for input(s): HGBA1C in the last 72 hours. CBG: Recent Labs  Lab 09/30/21 1220  GLUCAP 121*   Lipid Profile: No results for input(s): CHOL, HDL, LDLCALC, TRIG, CHOLHDL,  LDLDIRECT in the last 72 hours. Thyroid Function Tests: No results for input(s): TSH, T4TOTAL, FREET4, T3FREE, THYROIDAB in the last 72 hours. Anemia Panel: No results for input(s): VITAMINB12, FOLATE, FERRITIN, TIBC, IRON, RETICCTPCT in the last 72 hours.  --------------------------------------------------------------------------------------------------------------- Urine analysis:    Component Value Date/Time   COLORURINE STRAW (A) 05/01/2020 2005   APPEARANCEUR CLEAR 05/01/2020 2005   LABSPEC 1.008 05/01/2020 2005   PHURINE 8.0 05/01/2020 2005   GLUCOSEU NEGATIVE 05/01/2020 2005   GLUCOSEU NEGATIVE 06/30/2015 0932   HGBUR NEGATIVE 05/01/2020 2005   BILIRUBINUR NEGATIVE 05/01/2020 2005   BILIRUBINUR neg 08/09/2016 0844  KETONESUR 5 (A) 05/01/2020 2005   PROTEINUR NEGATIVE 05/01/2020 2005   UROBILINOGEN 0.2 08/09/2016 0844   UROBILINOGEN 0.2 06/30/2015 0932   NITRITE NEGATIVE 05/01/2020 2005   LEUKOCYTESUR TRACE (A) 05/01/2020 2005      Imaging Results:    CT Head Wo Contrast  Result Date: 09/30/2021 CLINICAL DATA:  Mental status change, unknown cause EXAM: CT HEAD WITHOUT CONTRAST TECHNIQUE: Contiguous axial images were obtained from the base of the skull through the vertex without intravenous contrast. RADIATION DOSE REDUCTION: This exam was performed according to the departmental dose-optimization program which includes automated exposure control, adjustment of the mA and/or kV according to patient size and/or use of iterative reconstruction technique. COMPARISON:  CT head 05/01/2020. FINDINGS: Brain: No evidence of acute infarction, hemorrhage, hydrocephalus, extra-axial collection or mass lesion/mass effect. Vascular: No hyperdense vessel identified. Skull: No acute fracture. Sinuses/Orbits: Clear visualized sinuses. No acute orbital findings. Other: No mastoid effusions. IMPRESSION: No evidence of acute intracranial abnormality. Electronically Signed   By: Margaretha Sheffield  M.D.   On: 09/30/2021 15:16   EEG adult  Result Date: 09/30/2021 Lora Havens, MD     09/30/2021  3:49 PM Patient Name: Kim Mclaughlin MRN: [id number] Epilepsy Attending: Lora Havens Referring Physician/Provider: Lorelle Gibbs, DO Date: 09/30/2021 Duration: 22.39 mins Patient history:  76 year old female with presentation that is classically consistent with transient global amnesia.  EEG to evaluate for seizure Level of alertness: Awake, drowsy AEDs during EEG study: None Technical aspects: This EEG study was done with scalp electrodes positioned according to the 10-20 International system of electrode placement. Electrical activity was acquired at a sampling rate of '500Hz'$  and reviewed with a high frequency filter of '70Hz'$  and a low frequency filter of '1Hz'$ . EEG data were recorded continuously and digitally stored. Description: The posterior dominant rhythm consists of 8 Hz activity of moderate voltage (25-35 uV) seen predominantly in posterior head regions, symmetric and reactive to eye opening and eye closing. Drowsiness was characterized by attenuation of the posterior background rhythm. Hyperventilation and photic stimulation were not performed.   IMPRESSION: This study is within normal limits. No seizures or epileptiform discharges were seen throughout the recording. [place]       Assessment & Plan:    Principal Problem:   TGA (transient global amnesia)   Transient global amnesia-unclear etiology, patient had previous documented history of transient double amnesia and has been followed by neurology as outpatient.  EEG unremarkable, CT head unremarkable.  Neurology recommends admission for observation overnight.  Patient takes multiple supplements at home, advised her to talk to her primary care physician for cutting down some of the supplements as they can interact with each other and also most of them are not FDA approved and their side effect profile is not clearly documented.   Will continue with neurochecks every 2 hours for 12 hours.  Continue monitoring on telemetry.  We will follow UA.    DVT Prophylaxis-   Lovenox   AM Labs Ordered, also please review Full Orders  Family Communication: Admission, patients condition and plan of care including tests being ordered have been discussed with the patient and her husband at bedside who indicate understanding and agree with the plan and Code Status.  Code Status: Full code  Admission status: Observation  Time spent in minutes : 60 min   Ledarius Leeson S Aniceto Kyser M.D

## 2021-09-30 NOTE — ED Provider Notes (Signed)
[facility] DEPT Provider Note   CSN: [id number] Arrival date & time: 09/30/21  1146     History  Chief Complaint  Patient presents with   Altered Mental Status    Kim Mclaughlin is a 76 y.o. female.  HPI  76 year old female with documented previous episode of transient global amnesia presents emergency department with confusion.  Patient is accompanied by her husband.  He is also a poor historian.  However they say that her last known normal was around 10 AM this morning, approximately 2-1/2 hours ago.  She left to go to her hairdresser appointment at which time the hairdresser noted that the patient was confused.  They called the patient's sister who confirmed that the patient sounded change from baseline and the husband went to pick her up at the hairdresser.  The patient cannot recall certain episodes from this morning, she is having repetitive questioning about this morning.  Currently she is aware of where she is and oriented to self.  There is been no reported facial droop, vision change, speech change/aphasia, unilateral weakness/numbness.  Patient states she feels baseline but cannot recall certain things and is unsure why.  No recent fever, illness or new medications.  Home Medications Prior to Admission medications   Medication Sig Start Date End Date Taking? Authorizing Provider  Ascorbic Acid (VITAMIN C) 1000 MG tablet Take 1,000 mg by mouth daily.    [provider]  atorvastatin (LIPITOR) 80 MG tablet Take 1 tablet (80 mg total) by mouth daily. 04/16/21   Ann Held, DO  Bioflavonoid Products (QUERCETIN COMPLEX IMMUNE PO) Take 1 tablet by mouth in the morning, at noon, and at bedtime.    [provider]  Boswellia-Glucosamine-Vit D (OSTEO BI-FLEX ONE PER DAY PO) Take 1 tablet by mouth 2 (two) times daily.    [provider]  cetirizine (ZYRTEC) 10 MG tablet Take 10 mg by mouth daily as needed for  allergies.    [provider]  Cholecalciferol (VITAMIN D-3) 25 MCG (1000 UT) CAPS Take 1 capsule by mouth 2 (two) times daily.    [provider]  Glucosamine HCl-MSM (MSM GLUCOSAMINE PO) Take 2 tablets by mouth daily.    [provider]  NONFORMULARY OR COMPOUNDED ITEM compression socks 20-30 mm/ hg 06/18/21   Lowne Chase, Alferd Apa, DO  Omega-3 Fatty Acids (FISH OIL) 1000 MG CAPS Take 1 capsule by mouth daily.     [provider]  OVER THE COUNTER MEDICATION Take 1 tablet by mouth 3 (three) times daily. Bone strength    [provider]  QUERCETIN PO Take 1 tablet by mouth daily.    [provider]  triamcinolone (NASACORT) 55 MCG/ACT AERO nasal inhaler Place 2 sprays into the nose daily as needed (allergies).     [provider]  triamcinolone cream (KENALOG) 0.1 % Apply 1 application topically 2 (two) times daily as needed (eczema.).     [provider]  Ubiquinol 100 MG CAPS Take 1 capsule by mouth 2 (two) times daily.    [provider]  Zinc Gluconate 30 MG TABS Take 30 mg by mouth daily.    [provider]      Allergies    Macrobid  [nitrofurantoin macrocrystal], Doxycycline, Pravastatin sodium, Amoxicillin-pot clavulanate, Augmentin [amoxicillin-pot clavulanate], Codeine, Methylphenidate hcl, Methylprednisolone, and Sulfamethoxazole-trimethoprim    Review of Systems   Review of Systems  Unable to perform ROS: Mental status change   Physical  Exam Updated Vital Signs BP (!) 179/76 (BP Location: Left Arm)   Pulse 88   Temp 97.8 F (36.6 C) (Oral)   Resp 18   Ht 5' 4.5" (1.638 m)   SpO2 98%   BMI 24.13 kg/m  Physical Exam Vitals and nursing note reviewed.  Constitutional:      General: She is not in acute distress.    Appearance: Normal appearance.  HENT:     Head: Normocephalic.     Mouth/Throat:     Mouth: Mucous membranes are moist.  Eyes:     Pupils: Pupils are equal, round, and  reactive to light.  Cardiovascular:     Rate and Rhythm: Normal rate.  Pulmonary:     Effort: Pulmonary effort is normal. No respiratory distress.  Abdominal:     Palpations: Abdomen is soft.     Tenderness: There is no abdominal tenderness.  Skin:    General: Skin is warm.  Neurological:     General: No focal deficit present.     Mental Status: She is alert and oriented to person, place, and time.     Cranial Nerves: No cranial nerve deficit.     Motor: No weakness.  Psychiatric:        Mood and Affect: Mood normal.    ED Results / Procedures / Treatments   Labs (all labs ordered are listed, but only abnormal results are displayed) Labs Reviewed  CBG MONITORING, ED - Abnormal; Notable for the following components:      Result Value   Glucose-Capillary 121 (*)    All other components within normal limits  CBC  COMPREHENSIVE METABOLIC PANEL    EKG None  Radiology No results found.  Procedures Procedures    Medications Ordered in ED Medications - No data to display  ED Course/ Medical Decision Making/ A&P                           Medical Decision Making Amount and/or Complexity of Data Reviewed Labs: ordered. Radiology: ordered.   76 year old female presents emergency department with an acute episode of confusion.  She cannot recall events of this morning.  Currently she is alert and oriented x3 but otherwise has a difficult time answering questions about specific events this morning.  Patient had a similar episode in 2022.  Was admitted, had a full stroke work-up which included a CT head, CTA head and neck, CT perfusion, MRI and EEG all of which were normal.  She followed with Dr. Leonie Man as an outpatient neurologist, presumed episode of TGA.  Given the acute/ongoing nature of her symptoms we paged neurology.  Dr. Leonel Ramsay was able to come personally evaluate the patient while I was at bedside.  His neuro exam confirms that she is otherwise completely  neurologically intact and is displaying symptoms of an active TGA episode.  However plan to proceed with metabolic work-up, head CT and EEG while here in the department.  Anticipate observing the patient for multiple hours.  Fingerstick is normal.  Blood work is reassuring.  Head CT shows no acute changes.  EEG shows no epileptic activity.  On reevaluation patient continues to have amnesia, repetitive questioning, has not returned back to baseline.  Per plan made with neurology will plan on admission for observation.  Patients evaluation and results requires admission for further treatment and care.  Spoke with hospitalist, reviewed patient's ED course and they accept admission.  Patient agrees with admission  plan, offers no new complaints and is stable/unchanged at time of admit.        Final Clinical Impression(s) / ED Diagnoses Final diagnoses:  None    Rx / DC Orders ED Discharge Orders     None         Lorelle Gibbs, DO 09/30/21 1638

## 2021-09-30 NOTE — Progress Notes (Signed)
EEG complete - results pending 

## 2021-10-01 DIAGNOSIS — R413 Other amnesia: Secondary | ICD-10-CM | POA: Diagnosis not present

## 2021-10-01 LAB — CBC
HCT: 40.6 % (ref 36.0–46.0)
Hemoglobin: 13.5 g/dL (ref 12.0–15.0)
MCH: 31.5 pg (ref 26.0–34.0)
MCHC: 33.3 g/dL (ref 30.0–36.0)
MCV: 94.9 fL (ref 80.0–100.0)
Platelets: 296 10*3/uL (ref 150–400)
RBC: 4.28 MIL/uL (ref 3.87–5.11)
RDW: 13.2 % (ref 11.5–15.5)
WBC: 7.4 10*3/uL (ref 4.0–10.5)
nRBC: 0 % (ref 0.0–0.2)

## 2021-10-01 LAB — CREATININE, SERUM
Creatinine, Ser: 0.69 mg/dL (ref 0.44–1.00)
GFR, Estimated: >60 mL/min (ref >60)

## 2021-10-01 LAB — TSH: TSH: 1.117 u[IU]/mL (ref 0.350–4.500)

## 2021-10-01 MED ORDER — ACETAMINOPHEN 325 MG PO TABS
650.0000 mg | ORAL_TABLET | Freq: Four times a day (QID) | ORAL | Status: DC | PRN
  Filled 2021-10-01: qty 2

## 2021-10-01 MED ORDER — SODIUM CHLORIDE 0.9 % IV SOLN: INTRAVENOUS | Status: DC

## 2021-10-01 MED ORDER — ENOXAPARIN SODIUM 40 MG/0.4ML IJ SOSY: 40.0000 mg | PREFILLED_SYRINGE | INTRAMUSCULAR | Status: DC

## 2021-10-01 MED ORDER — ACETAMINOPHEN 650 MG RE SUPP: 650.0000 mg | Freq: Four times a day (QID) | RECTAL | Status: DC | PRN

## 2021-10-01 NOTE — ED Notes (Signed)
Documenting care events that occurred during [facility] downtime.

## 2021-10-01 NOTE — ED Notes (Signed)
Pt reports headache, req pain medication.

## 2021-10-01 NOTE — Discharge Summary (Addendum)
Physician Discharge Summary   Patient: Kim Mclaughlin MRN: [id number] DOB: April 11, 1946  Admit date:     09/30/2021  Discharge date: 10/01/21  Discharge Physician: Oswald Hillock   PCP: Ann Held, DO   Recommendations at discharge:   Use blood follow-up PCP in 1 week  Discharge Diagnoses: Principal Problem:   TGA (transient global amnesia)  Resolved Problems:   * No resolved hospital problems. *  Hospital Course:  76 y.o. female, with history of osteoarthritis, hyperlipidemia, documented previous history of transient global amnesia was brought to the hospital for confusion.  Patient was normal around 10 AM this morning.  Patient left to go to hairdresser appointment at which time hairdresser noticed that patient was confused.  They called patient's sister who confirmed that patient sounded change from baseline and has been went to pick patient from the hairdresser.  Patient cannot recall certain episodes in the morning.  She was having repetitive questioning about this morning. In the ED, CT head was unremarkable.  Neurology was consulted, neurology recommended to repeat EEG.  EEG was done which was unremarkable.  No further imaging recommended by neurology.  As patient was not back to baseline then tried hospitalist was called for admission for observation  Assessment and Plan:  Transient global amnesia-resolved.    Unclear etiology, patient had previous documented history of transient double amnesia and has been followed by neurology as outpatient.  EEG unremarkable, CT head unremarkable.  Neurology recommended admission for observation overnight and discharged home if amnesia resolves.  Patient takes multiple supplements at home, advised her to talk to her primary care physician for cutting down some of the supplements as they can interact with each other and also most of them are not FDA approved and their side effect profile is not clearly documented.  No new neurological  deficits.    UA is clear.  Follow-up neurology as outpatient   ?  [facility] -Patient had 1 episode of apnea monitored on telemetry -She does snore -Recommended to get sleep study as outpatient -Also had 3 beat NSVT which can happen from sleep apnea -Discussed with both patient and her husband at bedside      Consultants:  Procedures performed:  Disposition: Home Diet recommendation:  Discharge Diet Orders (From admission, onward)     Start     Ordered   10/01/21 0000  Diet - low sodium heart healthy        10/01/21 0817           Regular diet DISCHARGE MEDICATION: Allergies as of 10/01/2021       Reactions   Macrobid  [nitrofurantoin Macrocrystal] Other (See Comments)   Doxycycline Hives   Pravastatin Sodium    REACTION: shin pain   Amoxicillin-pot Clavulanate    GI symptoms; abdominal pain & nausea NOTE: able to take Ampicillin   Augmentin [amoxicillin-pot Clavulanate] Nausea Only   Upsets stomach   Codeine Nausea Only   Methylphenidate Hcl Palpitations   Methylprednisolone Rash   Rash and redness on face   Sulfamethoxazole-trimethoprim Nausea Only    nausea        Medication List     TAKE these medications    atorvastatin 80 MG tablet Commonly known as: LIPITOR Take 1 tablet (80 mg total) by mouth daily.   cetirizine 10 MG tablet Commonly known as: ZYRTEC Take 10 mg by mouth daily as needed for allergies.   Fish Oil 1000 MG Caps Take 2 capsules by mouth daily.  MSM GLUCOSAMINE PO Take 2 tablets by mouth daily.   Multivitamin Adults 50+ Tabs Take 1 tablet by mouth daily.   NONFORMULARY OR COMPOUNDED ITEM compression socks 20-30 mm/ hg   OSTEO BI-FLEX ONE PER DAY PO Take 1 tablet by mouth 2 (two) times daily.   OVER THE COUNTER MEDICATION Take 1 tablet by mouth 3 (three) times daily. Bone strength   QUERCETIN COMPLEX IMMUNE PO Take 1 tablet by mouth in the morning, at noon, and at bedtime.   triamcinolone 55 MCG/ACT Aero nasal  inhaler Commonly known as: NASACORT Place 2 sprays into the nose daily as needed (allergies).   triamcinolone cream 0.1 % Commonly known as: KENALOG Apply 1 application topically 2 (two) times daily as needed (eczema.).   Ubiquinol 100 MG Caps Take 1 capsule by mouth 2 (two) times daily.   vitamin C 1000 MG tablet Take 1,000 mg by mouth daily.   Vitamin D-3 25 MCG (1000 UT) Caps Take 1 capsule by mouth 2 (two) times daily.   Zinc Gluconate 30 MG Tabs Take 30 mg by mouth daily.        Discharge Exam: There were no vitals filed for this visit. General-appears in no acute distress Heart-S1-S2, regular, no murmur auscultated Lungs-clear to auscultation bilaterally, no wheezing or crackles auscultated Abdomen-soft, nontender, no organomegaly Extremities-no edema in the lower extremities Neuro-alert, oriented x3, no focal deficit noted  Condition at discharge: good  The results of significant diagnostics from this hospitalization (including imaging, microbiology, ancillary and laboratory) are listed below for reference.   Imaging Studies: CT Head Wo Contrast  Result Date: 09/30/2021 CLINICAL DATA:  Mental status change, unknown cause EXAM: CT HEAD WITHOUT CONTRAST TECHNIQUE: Contiguous axial images were obtained from the base of the skull through the vertex without intravenous contrast. RADIATION DOSE REDUCTION: This exam was performed according to the departmental dose-optimization program which includes automated exposure control, adjustment of the mA and/or kV according to patient size and/or use of iterative reconstruction technique. COMPARISON:  CT head 05/01/2020. FINDINGS: Brain: No evidence of acute infarction, hemorrhage, hydrocephalus, extra-axial collection or mass lesion/mass effect. Vascular: No hyperdense vessel identified. Skull: No acute fracture. Sinuses/Orbits: Clear visualized sinuses. No acute orbital findings. Other: No mastoid effusions. IMPRESSION: No evidence  of acute intracranial abnormality. Electronically Signed   By: Margaretha Sheffield M.D.   On: 09/30/2021 15:16   EEG adult  Result Date: 09/30/2021 Lora Havens, MD     09/30/2021  3:49 PM Patient Name: Kim Mclaughlin MRN: [id number] Epilepsy Attending: Lora Havens Referring Physician/Provider: Lorelle Gibbs, DO Date: 09/30/2021 Duration: 22.39 mins Patient history:  76 year old female with presentation that is classically consistent with transient global amnesia.  EEG to evaluate for seizure Level of alertness: Awake, drowsy AEDs during EEG study: None Technical aspects: This EEG study was done with scalp electrodes positioned according to the 10-20 International system of electrode placement. Electrical activity was acquired at a sampling rate of '500Hz'$  and reviewed with a high frequency filter of '70Hz'$  and a low frequency filter of '1Hz'$ . EEG data were recorded continuously and digitally stored. Description: The posterior dominant rhythm consists of 8 Hz activity of moderate voltage (25-35 uV) seen predominantly in posterior head regions, symmetric and reactive to eye opening and eye closing. Drowsiness was characterized by attenuation of the posterior background rhythm. Hyperventilation and photic stimulation were not performed.   IMPRESSION: This study is within normal limits. No seizures or epileptiform discharges were seen throughout the recording.  Lora Havens    Microbiology: Results for orders placed or performed in visit on 05/23/20  Novel Coronavirus, NAA ([facility])     Status: Abnormal   Collection Time: 05/23/20  9:29 AM   Specimen: Nasopharyngeal(NP) swabs in vial transport medium   Nasopharynge  Result Value Ref Range Status   SARS-CoV-2, NAA Detected (A) Not Detected Final    Comment: Patients who have a positive COVID-19 test result may now have treatment options. Treatment options are available for patients with mild to moderate symptoms and for hospitalized  patients. Visit our website at [email or web address] for resources and information. This nucleic acid amplification test was developed and its performance characteristics determined by [employer]. Nucleic acid amplification tests include RT-PCR and TMA. This test has not been FDA cleared or approved. This test has been authorized by FDA under an Emergency Use Authorization (EUA). This test is only authorized for the duration of time the declaration that circumstances exist justifying the authorization of the emergency use of in vitro diagnostic tests for detection of SARS-CoV-2 virus and/or diagnosis of COVID-19 infection under section 564(b)(1) of the Act, 21 U.S.C. 427CWC-3(J) (1), unless the authorization is terminated or revoked sooner. When diagnostic testing is negativ e, the possibility of a false negative result should be considered in the context of a patient's recent exposures and the presence of clinical signs and symptoms consistent with COVID-19. An individual without symptoms of COVID-19 and who is not shedding SARS-CoV-2 virus would expect to have a negative (not detected) result in this assay.   SARS-COV-2, NAA 2 DAY TAT     Status: None   Collection Time: 05/23/20  9:29 AM   Nasopharynge  Result Value Ref Range Status   SARS-CoV-2, NAA 2 DAY TAT Performed  Final    Labs: CBC: Recent Labs  Lab 09/30/21 1221 10/01/21 0350  WBC 8.6 7.4  HGB 14.9 13.5  HCT 44.8 40.6  MCV 94.5 94.9  PLT 339 628   Basic Metabolic Panel: Recent Labs  Lab 09/30/21 1221 10/01/21 0350  NA 142  --   K 3.6  --   CL 106  --   CO2 23  --   GLUCOSE 119*  --   BUN 16  --   CREATININE 0.89 0.69  CALCIUM 10.5*  --    Liver Function Tests: Recent Labs  Lab 09/30/21 1221  AST 29  ALT 34  ALKPHOS 81  BILITOT 1.1  PROT 8.2*  ALBUMIN 4.5   CBG: Recent Labs  Lab 09/30/21 1220  GLUCAP 121*    Discharge time spent: greater than 30  minutes.  Signed: Oswald Hillock, MD [facility] 10/01/2021

## 2021-10-02 ENCOUNTER — Ambulatory Visit (INDEPENDENT_AMBULATORY_CARE_PROVIDER_SITE_OTHER): Payer: Medicare PPO | Admitting: Family Medicine

## 2021-10-02 VITALS — BP 120/60 | HR 94 | Temp 98.1°F | Resp 18 | Ht 64.5 in | Wt 137.4 lb

## 2021-10-02 DIAGNOSIS — G4733 Obstructive sleep apnea (adult) (pediatric): Secondary | ICD-10-CM | POA: Diagnosis not present

## 2021-10-02 DIAGNOSIS — G454 Transient global amnesia: Secondary | ICD-10-CM | POA: Diagnosis not present

## 2021-10-02 NOTE — Patient Instructions (Signed)
Transient Global Amnesia Transient global amnesia causes a sudden and temporary (transient) loss of memory (amnesia). You may recall memories from your distant past and people you know well. However, you may not recall things that happened more recently in the past days, months, or even year. A transient global amnesia episode does not last longer than 24 hours. Transient global amnesia does not affect your other brain functions. Your memory usually returns to normal after an episode is over. One episode of transient global amnesia does not make you more likely to have a stroke, a relapse, or other complications. What are the causes? The cause of this condition is not known. Certain activities have been reported to trigger transient global amnesia. These activities include: Swimming in very cold or hot water. Sexual intercourse. Emotional distress, such as receiving bad news or having a lot of stress at once. Strenuous exercise or activity. What increases the risk? You are more likely to develop this condition if: You are 50-70 years old. You have a history of migraine headaches. What are the signs or symptoms? The main symptoms of this condition include: Being unable to remember recent events. Asking repetitive questions about a situation and surroundings and not recalling the answers to these questions. Other symptoms include: Restlessness and nervousness. Confusion. Headaches. Dizziness. Nausea. How is this diagnosed? This condition may be diagnosed based on: Your symptoms. A physical exam. A test to check your mental abilities (cognitive evaluation). Imaging studies to check brain function. These may include: Electroencephalogram (EEG). This test checks the brain's electrical activity. CT scan. MRI. How is this treated? There is no treatment for this condition. An episode typically goes away on its own after a few hours. You may receive medicines to treat other conditions, such  as a migraine. Follow these instructions at home: Take over-the-counter and prescription medicines only as told by your health care provider. Avoid taking medicines that can affect thinking, such as pain or sleeping medicines. Learn what activities may trigger an episode. Avoid these activities as told by your health care provider. Find ways to manage stress, such as meditation or yoga. Keep all follow-up visits as told by your health care provider. This is important. Contact a health care provider if you: Have a migraine that does not go away. Experience transient global amnesia repeatedly. Get help right away if you: Have a seizure. Summary Transient global amnesia causes a sudden and temporary (transient) loss of memory (amnesia). There is no treatment for this condition. An episode typically goes away on its own after a few hours. You may receive medicines to treat other conditions, such as a migraine. Transient global amnesia does not affect your other brain functions. Your memory usually returns to normal after an episode is over. This information is not intended to replace advice given to you by your health care provider. Make sure you discuss any questions you have with your health care provider. Document Revised: 06/23/2020 Document Reviewed: 06/23/2020 [employer] Patient Education  2023 [employer]  

## 2021-10-02 NOTE — Progress Notes (Signed)
Established Patient Office Visit  Subjective   Patient ID: Kim Mclaughlin, female    DOB: 07-Sep-1945  Age: 76 y.o. MRN: [id number]  Chief Complaint  Patient presents with   ED follow up    HPI Pt is here to f/u hoep TGA from emotional stress.   She was admitted 6/7-6/8 for amnesia ---- she became very confused at the hairdresser .  Her sister called her husband -- her husband picked her up and brought her to er.  Pt doe not remember this.   Ct / eeg in ed/ hosp viist -- was unremarkable.  Neuro saw pt in hosp and was advised to f/u neuro  Pt is back to baseline   no complaints  Patient Active Problem List   Diagnosis Date Noted   OSA (obstructive sleep apnea) 10/08/2021   TGA (transient global amnesia) 09/30/2021   Varicose veins of both lower extremities with pain 06/21/2021   Preventative health care 10/27/2020   Leg cramps 10/27/2020   Brain aneurysm 05/08/2020   Aneurysm ([place]) 05/02/2020   Transient global amnesia 05/01/2020   Depression, major, single episode, moderate ([place]) 08/05/2017   Loss of transverse plantar arch 10/10/2015   Arthritis of foot, degenerative 07/16/2015   Wellness examination 06/30/2015   Varicose veins of lower extremities with complications 27/06/5007   Varicose veins of lower extremities with other complications 38/18/2993   Chronic venous insufficiency 10/05/2013   Hyperplastic colonic polyp 08/15/2013   Unspecified adverse effect of unspecified drug, medicinal and biological substance 07/05/2011   ARTHRALGIA 10/13/2009   HEMIANOPSIA, HOMONYMOUS, LEFT 10/24/2008   VARICOSE VEINS, LOWER EXTREMITIES 10/24/2008   Osteoporosis 02/29/2008   Hyperlipidemia 07/25/2007   MITRAL VALVE PROLAPSE 05/09/2007   ABNORMAL HEART RHYTHMS 05/09/2007   ADD 02/28/2007   Past Medical History:  Diagnosis Date   Allergy    Arthritis 07/2014   Right hand, prescribed prednisone-no longer taking. also Rt knww   Brain aneurysm    DVT (deep venous thrombosis)  ([place]) 2015   very small; treated with compression and heat, no other problems since   GERD (gastroesophageal reflux disease)    patient denies states she does have hx of peptic ulcer many years   Hepatitis    in 10 th grade  (not sure type- not hep C) - treated  no other problems   Hyperlipidemia    diet controlled   Memory loss    MVP (mitral valve prolapse)    no problems, MD told patient she could not hear it   OSA (obstructive sleep apnea) 10/08/2021   Osteoarthritis    Osteopenia    [place]   PONV (postoperative nausea and vomiting)    with ear surgery only - No other problems with other surgeries   Precancerous lesion 12/2014   Treated with cream only   TGA (transient global amnesia)    Varicose veins    Vitamin D deficiency    Past Surgical History:  Procedure Laterality Date   APPENDECTOMY     BUNIONECTOMY WITH HAMMERTOE RECONSTRUCTION Left 04/22/2016   Procedure: MODIFIED MCBRIDE BUNIONECTOMY,  LEFT SECOND HAMMERTOE CORRECTION;  Surgeon: Wylene Simmer, MD;  Location: [place];  Service: Orthopedics;  Laterality: Left;   COLONOSCOPY     07/2012 [facility] - [facility]/father - polyps   colonoscopy with polypectomy  07/2012    hyperplastic;Dr Brodie   DILATION AND CURETTAGE OF UTERUS     DILATION AND CURETTAGE OF UTERUS N/A 09/03/2014   Procedure: DILATATION AND  CURETTAGE;  Surgeon: Harle Battiest, MD;  Location[facility];  Service: Gynecology;  Laterality: N/A;   ENDOVENOUS ABLATION SAPHENOUS VEIN W/ LASER Left 01-31-2014   EVLA  left greater saphenous vein  by Curt Jews MD   ENDOVENOUS ABLATION SAPHENOUS VEIN W/ LASER Right 03-14-2014   endovenous laser ablation right greater saphenous vein  by Curt Jews MD   FOOT ARTHRODESIS Left 04/22/2016   Procedure: LEFT FIRST TARSAL METATARSAL ARTHRODESIS;  Surgeon: Wylene Simmer, MD;  Location: [place];  Service: Orthopedics;  Laterality: Left;   G 3 P 3     HYSTEROSCOPY WITH D & C N/A  03/25/2016   Procedure: DILATATION AND CURETTAGE /HYSTEROSCOPY;  Surgeon: Jerelyn Charles, MD;  Location: [facility];  Service: Gynecology;  Laterality: N/A;  WITH ULTRASOUND GUIDANCE   left ear surgery     STAPEDECTOMY     TARSAL METATARSAL FUSION WITH WEIL OSTEOTOMY Left 04/22/2016   Procedure: 2-4 TARSAL METATARSAL FUSION WITH WEIL OSTEOTOMY;  Surgeon: Wylene Simmer, MD;  Location: [place];  Service: Orthopedics;  Laterality: Left;   TUBAL LIGATION     Social History   Tobacco Use   Smoking status: Former    Types: Cigarettes    Quit date: 04/27/1963    Years since quitting: 58.4   Smokeless tobacco: Never   Tobacco comments:    Smoked < 6 mos;< 1 pack total;quit 1965  Vaping Use   Vaping Use: Never used  Substance Use Topics   Alcohol use: No   Drug use: No   Social History   Socioeconomic History   Marital status: Married    Spouse name: [name]   Number of children: Not on file   Years of education: Not on file   Highest education level: Not on file  Occupational History   Occupation: retired  Tobacco Use   Smoking status: Former    Types: Cigarettes    Quit date: 04/27/1963    Years since quitting: 58.4   Smokeless tobacco: Never   Tobacco comments:    Smoked < 6 mos;< 1 pack total;quit 1965  Vaping Use   Vaping Use: Never used  Substance and Sexual Activity   Alcohol use: No   Drug use: No   Sexual activity: Yes    Birth control/protection: Post-menopausal  Other Topics Concern   Not on file  Social History Narrative   Lives with husband   Right Handed   Drinks average 2-3 cups caffeine daily   Social Determinants of Health   Financial Resource Strain: Low Risk  (02/25/2021)   Overall Financial Resource Strain (CARDIA)    Difficulty of Paying Living Expenses: Not hard at all  Food Insecurity: No Food Insecurity (02/25/2021)   Hunger Vital Sign    Worried About Running Out of Food in the Last Year: Never true    [place] in the Last Year:  Never true  Transportation Needs: No Transportation Needs (02/25/2021)   PRAPARE - Hydrologist (Medical): No    Lack of Transportation (Non-Medical): No  Physical Activity: Inactive (02/25/2021)   Exercise Vital Sign    Days of Exercise per Week: 0 days    Minutes of Exercise per Session: 0 min  Stress: No Stress Concern Present (02/25/2021)   [place]    Feeling of Stress : Not at all  Social Connections: Moderately Integrated (02/25/2021)   Social  Connection and Isolation Panel [NHANES]    Frequency of Communication with Friends and Family: More than three times a week    Frequency of Social Gatherings with Friends and Family: More than three times a week    Attends Religious Services: More than 4 times per year    Active Member of [employer] or Organizations: No    Attends Archivist Meetings: Never    Marital Status: Married  Human resources officer Violence: Not At Risk (02/25/2021)   Humiliation, Afraid, Rape, and Kick questionnaire    Fear of Current or Ex-Partner: No    Emotionally Abused: No    Physically Abused: No    Sexually Abused: No   Family Status  Relation Name Status   Mother  Deceased at age 69   Father  Deceased at age 76   Sister  Alive   Sister  Alive   Sister  Ecologist  (Not Specified)   Field seismologist  (Not Specified)   Psychiatrist  (Not Specified)   PGM  (Not Specified)   PGF  (Not Specified)   Other  (Not Specified)   Neg Hx  (Not Specified)   Family History  Problem Relation Age of Onset   Pancreatic cancer Mother    Miscarriages / [place] Mother    Thyroid disease Mother        partial thyroidectomy ; S/P RAI   Heart attack Father        in early 18s   Colon cancer Father    Seizures Sister    Ovarian cancer Maternal Aunt    Breast cancer Paternal Aunt    Aneurysm Paternal Uncle        brain   Colon cancer Paternal Grandmother    Stroke  Paternal Grandmother        > 54   Heart attack Paternal Grandfather        in 81s   Cancer Other        among paternal sibs (colon, breast, bone)   Diabetes Neg Hx    Stomach cancer Neg Hx    Ulcerative colitis Neg Hx    Esophageal cancer Neg Hx    Rectal cancer Neg Hx    Allergies  Allergen Reactions   Macrobid  [Nitrofurantoin Macrocrystal] Other (See Comments)   Doxycycline Hives   Pravastatin Sodium     REACTION: shin pain   Amoxicillin-Pot Clavulanate     GI symptoms; abdominal pain & nausea NOTE: able to take Ampicillin   Augmentin [Amoxicillin-Pot Clavulanate] Nausea Only    Upsets stomach   Codeine Nausea Only   Methylphenidate Hcl Palpitations   Methylprednisolone Rash    Rash and redness on face   Sulfamethoxazole-Trimethoprim Nausea Only     nausea      Review of Systems  Constitutional:  Negative for fever and malaise/fatigue.  HENT:  Negative for congestion.   Eyes:  Negative for blurred vision.  Respiratory:  Negative for shortness of breath.   Cardiovascular:  Negative for chest pain, palpitations and leg swelling.  Gastrointestinal:  Negative for abdominal pain, blood in stool and nausea.  Genitourinary:  Negative for dysuria and frequency.  Musculoskeletal:  Negative for falls.  Skin:  Negative for rash.  Neurological:  Negative for dizziness, loss of consciousness and headaches.  Endo/Heme/Allergies:  Negative for environmental allergies.  Psychiatric/Behavioral:  Negative for depression. The patient is not nervous/anxious.       Objective:     BP  120/60 (BP Location: Right Arm, Patient Position: Sitting, Cuff Size: Normal)   Pulse 94   Temp 98.1 F (36.7 C) (Oral)   Resp 18   Ht 5' 4.5" (1.638 m)   Wt 137 lb 6.4 oz (62.3 kg)   SpO2 98%   BMI 23.22 kg/m  BP Readings from Last 3 Encounters:  10/02/21 120/60  10/01/21 (!) 155/77  06/18/21 112/70   Wt Readings from Last 3 Encounters:  10/02/21 137 lb 6.4 oz (62.3 kg)  06/18/21 142 lb  12.8 oz (64.8 kg)  04/16/21 143 lb (64.9 kg)   SpO2 Readings from Last 3 Encounters:  10/02/21 98%  10/01/21 98%  06/18/21 98%      Physical Exam Vitals and nursing note reviewed.  Constitutional:      Appearance: She is well-developed.  HENT:     Head: Normocephalic and atraumatic.  Eyes:     Conjunctiva/sclera: Conjunctivae normal.  Neck:     Thyroid: No thyromegaly.     Vascular: No carotid bruit or JVD.  Cardiovascular:     Rate and Rhythm: Normal rate and regular rhythm.     Heart sounds: Normal heart sounds. No murmur heard. Pulmonary:     Effort: Pulmonary effort is normal. No respiratory distress.     Breath sounds: Normal breath sounds. No wheezing or rales.  Chest:     Chest wall: No tenderness.  Musculoskeletal:     Cervical back: Normal range of motion and neck supple.  Neurological:     General: No focal deficit present.     Mental Status: She is alert and oriented to person, place, and time. Mental status is at baseline.  Psychiatric:        Mood and Affect: Mood normal.        Behavior: Behavior normal.        Thought Content: Thought content normal.        Judgment: Judgment normal.      Results for orders placed or performed in visit on 10/02/21  CBC with Differential/Platelet  Result Value Ref Range   WBC 9.0 3.8 - 10.8 Thousand/uL   RBC 4.46 3.80 - 5.10 Million/uL   Hemoglobin 14.3 11.7 - 15.5 g/dL   HCT 41.9 35.0 - 45.0 %   MCV 93.9 80.0 - 100.0 fL   MCH 32.1 27.0 - 33.0 pg   MCHC 34.1 32.0 - 36.0 g/dL   RDW 11.9 11.0 - 15.0 %   Platelets 360 140 - 400 Thousand/uL   MPV 10.0 7.5 - 12.5 fL   Neutro Abs 6,831 1,500 - 7,800 cells/uL   Lymphs Abs 1,494 850 - 3,900 cells/uL   Absolute Monocytes 540 200 - 950 cells/uL   Eosinophils Absolute 72 15 - 500 cells/uL   Basophils Absolute 63 0 - 200 cells/uL   Neutrophils Relative % 75.9 %   Total Lymphocyte 16.6 %   Monocytes Relative 6.0 %   Eosinophils Relative 0.8 %   Basophils Relative 0.7  %  Comprehensive metabolic panel  Result Value Ref Range   Glucose, Bld 178 (H) 65 - 99 mg/dL   BUN 15 7 - 25 mg/dL   Creat 0.95 0.60 - 1.00 mg/dL   BUN/Creatinine Ratio NOT APPLICABLE 6 - 22 (calc)   Sodium 137 135 - 146 mmol/L   Potassium 4.1 3.5 - 5.3 mmol/L   Chloride 103 98 - 110 mmol/L   CO2 23 20 - 32 mmol/L   Calcium 10.4 8.6 - 10.4 mg/dL  Total Protein 7.0 6.1 - 8.1 g/dL   Albumin 4.5 3.6 - 5.1 g/dL   Globulin 2.5 1.9 - 3.7 g/dL (calc)   AG Ratio 1.8 1.0 - 2.5 (calc)   Total Bilirubin 0.7 0.2 - 1.2 mg/dL   Alkaline phosphatase (APISO) 84 37 - 153 U/L   AST 22 10 - 35 U/L   ALT 25 6 - 29 U/L  Lipid panel  Result Value Ref Range   Cholesterol 160 <200 mg/dL   HDL 105 > OR = 50 mg/dL   Triglycerides 50 <150 mg/dL   LDL Cholesterol (Calc) 43 mg/dL (calc)   Total CHOL/HDL Ratio 1.5 <5.0 (calc)   Non-HDL Cholesterol (Calc) 55 <130 mg/dL (calc)  TSH  Result Value Ref Range   TSH 1.74 0.40 - 4.50 mIU/L  Vitamin B12  Result Value Ref Range   Vitamin B-12 538 200 - 1,100 pg/mL  VITAMIN D 25 Hydroxy (Vit-D Deficiency, Fractures)  Result Value Ref Range   Vit D, 25-Hydroxy 81 30 - 100 ng/mL    Last CBC Lab Results  Component Value Date   WBC 9.0 10/02/2021   HGB 14.3 10/02/2021   HCT 41.9 10/02/2021   MCV 93.9 10/02/2021   MCH 32.1 10/02/2021   RDW 11.9 10/02/2021   PLT 360 60/01/9322   Last metabolic panel Lab Results  Component Value Date   GLUCOSE 178 (H) 10/02/2021   NA 137 10/02/2021   K 4.1 10/02/2021   CL 103 10/02/2021   CO2 23 10/02/2021   BUN 15 10/02/2021   CREATININE 0.95 10/02/2021   GFRNONAA >60 10/01/2021   CALCIUM 10.4 10/02/2021   PHOS 3.4 10/21/2020   PROT 7.0 10/02/2021   ALBUMIN 4.5 09/30/2021   BILITOT 0.7 10/02/2021   ALKPHOS 81 09/30/2021   AST 22 10/02/2021   ALT 25 10/02/2021   ANIONGAP 13 09/30/2021   Last lipids Lab Results  Component Value Date   CHOL 160 10/02/2021   HDL 105 10/02/2021   LDLCALC 43 10/02/2021    LDLDIRECT 188.0 08/08/2012   TRIG 50 10/02/2021   CHOLHDL 1.5 10/02/2021   Last hemoglobin A1c Lab Results  Component Value Date   HGBA1C 5.7 (H) 05/02/2020   Last thyroid functions Lab Results  Component Value Date   TSH 1.74 10/02/2021   Last vitamin D Lab Results  Component Value Date   VD25OH 81 10/02/2021   Last vitamin B12 and Folate Lab Results  Component Value Date   VITAMINB12 538 10/02/2021   FOLATE 79.4 05/01/2020      The ASCVD Risk score (Arnett DK, et al., 2019) failed to calculate for the following reasons:   The valid HDL cholesterol range is 20 to 100 mg/dL    Assessment & Plan:   Problem List Items Addressed This Visit       Unprioritized   TGA (transient global amnesia) - Primary   Relevant Orders   Ambulatory referral to Neurology   CBC with Differential/Platelet (Completed)   Comprehensive metabolic panel (Completed)   Lipid panel (Completed)   TSH (Completed)   Vitamin B12 (Completed)   VITAMIN D 25 Hydroxy (Vit-D Deficiency, Fractures) (Completed)   Transient global amnesia    Back to baseline Refer to neuro      OSA (obstructive sleep apnea)    Refer to neuro       Relevant Orders   Ambulatory referral to Neurology   CBC with Differential/Platelet (Completed)   Comprehensive metabolic panel (Completed)   Lipid panel (  Completed)   TSH (Completed)   Vitamin B12 (Completed)   VITAMIN D 25 Hydroxy (Vit-D Deficiency, Fractures) (Completed)    Return if symptoms worsen or fail to improve.    Ann Held, DO

## 2021-10-03 LAB — CBC WITH DIFFERENTIAL/PLATELET
Absolute Monocytes: 540 cells/uL (ref 200–950)
Basophils Absolute: 63 cells/uL (ref 0–200)
Basophils Relative: 0.7 %
Eosinophils Absolute: 72 cells/uL (ref 15–500)
Eosinophils Relative: 0.8 %
HCT: 41.9 % (ref 35.0–45.0)
Hemoglobin: 14.3 g/dL (ref 11.7–15.5)
Lymphs Abs: 1494 cells/uL (ref 850–3900)
MCH: 32.1 pg (ref 27.0–33.0)
MCHC: 34.1 g/dL (ref 32.0–36.0)
MCV: 93.9 fL (ref 80.0–100.0)
MPV: 10 fL (ref 7.5–12.5)
Monocytes Relative: 6 %
Neutro Abs: 6831 cells/uL (ref 1500–7800)
Neutrophils Relative %: 75.9 %
Platelets: 360 10*3/uL (ref 140–400)
RBC: 4.46 10*6/uL (ref 3.80–5.10)
RDW: 11.9 % (ref 11.0–15.0)
Total Lymphocyte: 16.6 %
WBC: 9 10*3/uL (ref 3.8–10.8)

## 2021-10-03 LAB — VITAMIN D 25 HYDROXY (VIT D DEFICIENCY, FRACTURES): Vit D, 25-Hydroxy: 81 ng/mL (ref 30–100)

## 2021-10-03 LAB — COMPREHENSIVE METABOLIC PANEL
AG Ratio: 1.8 (calc) (ref 1.0–2.5)
ALT: 25 U/L (ref 6–29)
AST: 22 U/L (ref 10–35)
Albumin: 4.5 g/dL (ref 3.6–5.1)
Alkaline phosphatase (APISO): 84 U/L (ref 37–153)
BUN: 15 mg/dL (ref 7–25)
CO2: 23 mmol/L (ref 20–32)
Calcium: 10.4 mg/dL (ref 8.6–10.4)
Chloride: 103 mmol/L (ref 98–110)
Creat: 0.95 mg/dL (ref 0.60–1.00)
Globulin: 2.5 g/dL (calc) (ref 1.9–3.7)
Glucose, Bld: 178 mg/dL — ABNORMAL HIGH (ref 65–99)
Potassium: 4.1 mmol/L (ref 3.5–5.3)
Sodium: 137 mmol/L (ref 135–146)
Total Bilirubin: 0.7 mg/dL (ref 0.2–1.2)
Total Protein: 7 g/dL (ref 6.1–8.1)

## 2021-10-03 LAB — LIPID PANEL
Cholesterol: 160 mg/dL (ref <200)
HDL: 105 mg/dL (ref >=50)
LDL Cholesterol (Calc): 43 mg/dL (calc)
Non-HDL Cholesterol (Calc): 55 mg/dL (calc) (ref <130)
Total CHOL/HDL Ratio: 1.5 (calc) (ref <5.0)
Triglycerides: 50 mg/dL (ref <150)

## 2021-10-03 LAB — TSH: TSH: 1.74 mIU/L (ref 0.40–4.50)

## 2021-10-03 LAB — VITAMIN B12: Vitamin B-12: 538 pg/mL (ref 200–1100)

## 2021-10-04 ENCOUNTER — Other Ambulatory Visit: Payer: Self-pay | Admitting: Family Medicine

## 2021-10-04 DIAGNOSIS — R739 Hyperglycemia, unspecified: Secondary | ICD-10-CM

## 2021-10-08 ENCOUNTER — Encounter: Payer: Self-pay | Admitting: Family Medicine

## 2021-10-08 DIAGNOSIS — G4733 Obstructive sleep apnea (adult) (pediatric): Secondary | ICD-10-CM

## 2021-10-08 HISTORY — DX: Obstructive sleep apnea (adult) (pediatric): G47.33

## 2021-10-08 NOTE — Assessment & Plan Note (Signed)
Back to baseline Refer to neuro

## 2021-10-08 NOTE — Assessment & Plan Note (Addendum)
Refer to neuro 

## 2021-10-22 ENCOUNTER — Encounter: Payer: Self-pay | Admitting: Family Medicine

## 2021-10-22 ENCOUNTER — Ambulatory Visit (INDEPENDENT_AMBULATORY_CARE_PROVIDER_SITE_OTHER): Payer: Medicare PPO | Admitting: Family Medicine

## 2021-10-22 VITALS — BP 114/80 | HR 74 | Temp 97.7°F | Resp 18 | Ht 64.5 in | Wt 136.8 lb

## 2021-10-22 DIAGNOSIS — M8000XA Age-related osteoporosis with current pathological fracture, unspecified site, initial encounter for fracture: Secondary | ICD-10-CM | POA: Diagnosis not present

## 2021-10-22 DIAGNOSIS — Z0000 Encounter for general adult medical examination without abnormal findings: Secondary | ICD-10-CM | POA: Diagnosis not present

## 2021-10-22 DIAGNOSIS — R739 Hyperglycemia, unspecified: Secondary | ICD-10-CM | POA: Diagnosis not present

## 2021-10-22 DIAGNOSIS — F419 Anxiety disorder, unspecified: Secondary | ICD-10-CM | POA: Diagnosis not present

## 2021-10-22 DIAGNOSIS — E785 Hyperlipidemia, unspecified: Secondary | ICD-10-CM | POA: Diagnosis not present

## 2021-10-22 LAB — CBC WITH DIFFERENTIAL/PLATELET
Basophils Absolute: 0 10*3/uL (ref 0.0–0.1)
Basophils Relative: 0.8 % (ref 0.0–3.0)
Eosinophils Absolute: 0.2 10*3/uL (ref 0.0–0.7)
Eosinophils Relative: 2.6 % (ref 0.0–5.0)
HCT: 42.2 % (ref 36.0–46.0)
Hemoglobin: 14.1 g/dL (ref 12.0–15.0)
Lymphocytes Relative: 29 % (ref 12.0–46.0)
Lymphs Abs: 1.7 10*3/uL (ref 0.7–4.0)
MCHC: 33.4 g/dL (ref 30.0–36.0)
MCV: 95.1 fl (ref 78.0–100.0)
Monocytes Absolute: 0.5 10*3/uL (ref 0.1–1.0)
Monocytes Relative: 7.9 % (ref 3.0–12.0)
Neutro Abs: 3.4 10*3/uL (ref 1.4–7.7)
Neutrophils Relative %: 59.7 % (ref 43.0–77.0)
Platelets: 270 10*3/uL (ref 150.0–400.0)
RBC: 4.44 Mil/uL (ref 3.87–5.11)
RDW: 13.9 % (ref 11.5–15.5)
WBC: 5.8 10*3/uL (ref 4.0–10.5)

## 2021-10-22 LAB — COMPREHENSIVE METABOLIC PANEL
ALT: 28 U/L (ref 0–35)
AST: 25 U/L (ref 0–37)
Albumin: 4.5 g/dL (ref 3.5–5.2)
Alkaline Phosphatase: 71 U/L (ref 39–117)
BUN: 14 mg/dL (ref 6–23)
CO2: 29 mEq/L (ref 19–32)
Calcium: 10.5 mg/dL (ref 8.4–10.5)
Chloride: 104 mEq/L (ref 96–112)
Creatinine, Ser: 0.88 mg/dL (ref 0.40–1.20)
GFR: 63.83 mL/min (ref >60.00)
Glucose, Bld: 96 mg/dL (ref 70–99)
Potassium: 4.4 mEq/L (ref 3.5–5.1)
Sodium: 141 mEq/L (ref 135–145)
Total Bilirubin: 0.9 mg/dL (ref 0.2–1.2)
Total Protein: 6.9 g/dL (ref 6.0–8.3)

## 2021-10-22 LAB — LIPID PANEL
Cholesterol: 175 mg/dL (ref 0–200)
HDL: 104.3 mg/dL (ref >39.00)
LDL Cholesterol: 61 mg/dL (ref 0–99)
NonHDL: 70.97
Total CHOL/HDL Ratio: 2
Triglycerides: 51 mg/dL (ref 0.0–149.0)
VLDL: 10.2 mg/dL (ref 0.0–40.0)

## 2021-10-22 MED ORDER — ALENDRONATE SODIUM 70 MG PO TABS: 70.0000 mg | ORAL_TABLET | ORAL | 11 refills | Status: DC

## 2021-10-22 MED ORDER — ESCITALOPRAM OXALATE 10 MG PO TABS: 10.0000 mg | ORAL_TABLET | Freq: Every day | ORAL | 1 refills | Status: DC

## 2021-10-22 MED ORDER — ATORVASTATIN CALCIUM 80 MG PO TABS: 80.0000 mg | ORAL_TABLET | Freq: Every day | ORAL | 1 refills | Status: DC

## 2021-10-22 MED ORDER — ALPRAZOLAM 0.25 MG PO TABS: 0.2500 mg | ORAL_TABLET | Freq: Two times a day (BID) | ORAL | 0 refills | Status: DC | PRN

## 2021-10-22 NOTE — Progress Notes (Signed)
Subjective:     Kim Mclaughlin is a 76 y.o. female and is here for a comprehensive physical exam. The patient reports problems - + anxiety.   She suspects her husband is cheating on her, he denies it.  They are in counseling but she is struggling with anxiety.   No other complaint s .  Social History   Socioeconomic History   Marital status: Married    Spouse name: [name]   Number of children: Not on file   Years of education: Not on file   Highest education level: Not on file  Occupational History   Occupation: retired  Tobacco Use   Smoking status: Former    Types: Cigarettes    Quit date: 04/27/1963    Years since quitting: 58.5   Smokeless tobacco: Never   Tobacco comments:    Smoked < 6 mos;< 1 pack total;quit 1965  Vaping Use   Vaping Use: Never used  Substance and Sexual Activity   Alcohol use: No   Drug use: No   Sexual activity: Yes    Birth control/protection: Post-menopausal  Other Topics Concern   Not on file  Social History Narrative   Lives with husband   Right Handed   Drinks average 2-3 cups caffeine daily   Social Determinants of Health   Financial Resource Strain: Low Risk  (02/25/2021)   Overall Financial Resource Strain (CARDIA)    Difficulty of Paying Living Expenses: Not hard at all  Food Insecurity: No Food Insecurity (02/25/2021)   Hunger Vital Sign    Worried About Running Out of Food in the Last Year: Never true    [place] in the Last Year: Never true  Transportation Needs: No Transportation Needs (02/25/2021)   PRAPARE - Hydrologist (Medical): No    Lack of Transportation (Non-Medical): No  Physical Activity: Inactive (02/25/2021)   Exercise Vital Sign    Days of Exercise per Week: 0 days    Minutes of Exercise per Session: 0 min  Stress: No Stress Concern Present (02/25/2021)   [place]    Feeling of Stress : Not at all  Social  Connections: Moderately Integrated (02/25/2021)   Social Connection and Isolation Panel [NHANES]    Frequency of Communication with Friends and Family: More than three times a week    Frequency of Social Gatherings with Friends and Family: More than three times a week    Attends Religious Services: More than 4 times per year    Active Member of [employer] or Organizations: No    Attends Archivist Meetings: Never    Marital Status: Married  Human resources officer Violence: Not At Risk (02/25/2021)   Humiliation, Afraid, Rape, and Kick questionnaire    Fear of Current or Ex-Partner: No    Emotionally Abused: No    Physically Abused: No    Sexually Abused: No   Health Maintenance  Topic Date Due   COVID-19 Vaccine (1) Never done   INFLUENZA VACCINE  11/24/2021   DEXA SCAN  08/08/2023   COLONOSCOPY (Pts 45-66yr Insurance coverage will need to be confirmed)  11/24/2025   TETANUS/TDAP  06/26/2028   Pneumonia Vaccine 76 Years old  Completed   Hepatitis C Screening  Completed   Zoster Vaccines- Shingrix  Completed   HPV VACCINES  Aged Out    The following portions of the patient's history were reviewed and updated as appropriate: She  has a past medical history of Allergy, Arthritis (07/2014), Brain aneurysm, DVT (deep venous thrombosis) ([place]) (2015), GERD (gastroesophageal reflux disease), Hepatitis, Hyperlipidemia, Memory loss, MVP (mitral valve prolapse), OSA (obstructive sleep apnea) (10/08/2021), Osteoarthritis, Osteopenia, PONV (postoperative nausea and vomiting), Precancerous lesion (12/2014), TGA (transient global amnesia), Varicose veins, and Vitamin D deficiency. She does not have any pertinent problems on file. She  has a past surgical history that includes Appendectomy; Tubal ligation; Stapedectomy; G 3 P 3; Dilation and curettage of uterus; colonoscopy with polypectomy (07/2012); Endovenous ablation saphenous vein w/ laser (Left, 01-31-2014); Endovenous ablation saphenous vein w/  laser (Right, 03-14-2014); Dilation and curettage of uterus (N/A, 09/03/2014); Hysteroscopy with D & C (N/A, 03/25/2016); Foot arthrodesis (Left, 04/22/2016); Bunionectomy with hammertoe reconstruction (Left, 04/22/2016); Tarsal metatarsal fusion with weil osteotomy (Left, 04/22/2016); Colonoscopy; and left ear surgery. Her family history includes Aneurysm in her paternal uncle; Breast cancer in her paternal aunt; Cancer in an other family member; Colon cancer in her father and paternal grandmother; Heart attack in her father and paternal grandfather; Miscarriages / [place] in her mother; Ovarian cancer in her maternal aunt; Pancreatic cancer in her mother; Seizures in her sister; Stroke in her paternal grandmother; Thyroid disease in her mother. She  reports that she quit smoking about 58 years ago. Her smoking use included cigarettes. She has never used smokeless tobacco. She reports that she does not drink alcohol and does not use drugs. She has a current medication list which includes the following prescription(s): alprazolam, boswellia-glucosamine-vit d, cetirizine, vitamin d-3, escitalopram, glucosamine hcl-msm, multivitamin adults 50+, NONFORMULARY OR COMPOUNDED ITEM, triamcinolone, triamcinolone cream, ubiquinol, and atorvastatin. Current Outpatient Medications on File Prior to Visit  Medication Sig Dispense Refill   Boswellia-Glucosamine-Vit D (OSTEO BI-FLEX ONE PER DAY PO) Take 1 tablet by mouth 2 (two) times daily.     cetirizine (ZYRTEC) 10 MG tablet Take 10 mg by mouth daily as needed for allergies.     Cholecalciferol (VITAMIN D-3) 25 MCG (1000 UT) CAPS Take 1 capsule by mouth 2 (two) times daily.     Glucosamine HCl-MSM (MSM GLUCOSAMINE PO) Take 2 tablets by mouth daily.     Multiple Vitamins-Minerals (MULTIVITAMIN ADULTS 50+) TABS Take 1 tablet by mouth daily.     NONFORMULARY OR COMPOUNDED ITEM compression socks 20-30 mm/ hg 1 each 0   triamcinolone (NASACORT) 55 MCG/ACT AERO nasal  inhaler Place 2 sprays into the nose daily as needed (allergies).      triamcinolone cream (KENALOG) 0.1 % Apply 1 application topically 2 (two) times daily as needed (eczema.).      Ubiquinol 100 MG CAPS Take 1 capsule by mouth 2 (two) times daily.     No current facility-administered medications on file prior to visit.   She is allergic to macrobid  [nitrofurantoin macrocrystal], doxycycline, pravastatin sodium, amoxicillin-pot clavulanate, augmentin [amoxicillin-pot clavulanate], codeine, methylphenidate hcl, methylprednisolone, and sulfamethoxazole-trimethoprim..  Review of Systems Review of Systems  Constitutional: Negative for activity change, appetite change and fatigue.  HENT: Negative for hearing loss, congestion, tinnitus and ear discharge.  dentist q31mEyes: Negative for visual disturbance (see optho q1y -- vision corrected to 20/20 with glasses).  Respiratory: Negative for cough, chest tightness and shortness of breath.   Cardiovascular: Negative for chest pain, palpitations and leg swelling.  Gastrointestinal: Negative for abdominal pain, diarrhea, constipation and abdominal distention.  Genitourinary: Negative for urgency, frequency, decreased urine volume and difficulty urinating.  Musculoskeletal: Negative for back pain, arthralgias and gait problem.  Skin: Negative for color  change, pallor and rash.  Neurological: Negative for dizziness, light-headedness, numbness and headaches.  Hematological: Negative for adenopathy. Does not bruise/bleed easily.  Psychiatric/Behavioral: Negative for suicidal ideas, confusion, sleep disturbance, self-injury, dysphoric mood, decreased concentration and agitation.      Objective:    BP 114/80 (BP Location: Left Arm, Patient Position: Sitting, Cuff Size: Normal)   Pulse 74   Temp 97.7 F (36.5 C) (Oral)   Resp 18   Ht 5' 4.5" (1.638 m)   Wt 136 lb 12.8 oz (62.1 kg)   SpO2 97%   BMI 23.12 kg/m  General appearance: alert,  cooperative, and no distress Head: Normocephalic, without obvious abnormality, atraumatic Eyes: conjunctivae/corneas clear. PERRL, EOM's intact. Fundi benign. Ears: normal TM's and external ear canals both ears Nose: Nares normal. Septum midline. Mucosa normal. No drainage or sinus tenderness. Throat: lips, mucosa, and tongue normal; teeth and gums normal Neck: no adenopathy, no carotid bruit, no JVD, supple, symmetrical, trachea midline, and thyroid not enlarged, symmetric, no tenderness/mass/nodules Back: negative Lungs: clear to auscultation bilaterally Heart: regular rate and rhythm, S1, S2 normal, no murmur, click, rub or gallop Abdomen: soft, non-tender; bowel sounds normal; no masses,  no organomegaly Extremities: extremities normal, atraumatic, no cyanosis or edema Pulses: 2+ and symmetric Skin: Skin color, texture, turgor normal. No rashes or lesions Lymph nodes: Cervical, supraclavicular, and axillary nodes normal. Neurologic: Alert and oriented X 3, normal strength and tone. Normal symmetric reflexes. Normal coordination and gait    Assessment:    Healthy female exam.      Plan:    Ghm utd Check labs  See After Visit Summary for Counseling Recommendations   1. Hyperlipidemia, unspecified hyperlipidemia type Tolerating statin, encouraged heart healthy diet, avoid trans fats, minimize simple carbs and saturated fats. Increase exercise as tolerated  - atorvastatin (LIPITOR) 80 MG tablet; Take 1 tablet (80 mg total) by mouth daily.  Dispense: 90 tablet; Refill: 1 - CBC with Differential/Platelet - Comprehensive metabolic panel - Lipid panel  2. Preventative health care Ghm utd Check labs   3. Anxiety F/u 1 month - escitalopram (LEXAPRO) 10 MG tablet; Take 1 tablet (10 mg total) by mouth daily.  Dispense: 30 tablet; Refill: 1 - ALPRAZolam (XANAX) 0.25 MG tablet; Take 1 tablet (0.25 mg total) by mouth 2 (two) times daily as needed for anxiety.  Dispense: 20 tablet;  Refill: 0  4. Age-related osteoporosis with current pathological fracture, initial encounter D/w pt bmd---- start fosamax Con't cal and vita d Weight bearing exercise  - alendronate (FOSAMAX) 70 MG tablet; Take 1 tablet (70 mg total) by mouth every 7 (seven) days. Take with a full glass of water on an empty stomach.  Dispense: 4 tablet; Refill: 11  5. Hyperglycemia Check labs  - Hemoglobin A1c

## 2021-10-22 NOTE — Telephone Encounter (Signed)
Please advise on the essential oils

## 2021-10-22 NOTE — Patient Instructions (Signed)
Preventive Care 65 Years and Older, Female Preventive care refers to lifestyle choices and visits with your health care provider that can promote health and wellness. Preventive care visits are also called wellness exams. What can I expect for my preventive care visit? Counseling Your health care provider may ask you questions about your: Medical history, including: Past medical problems. Family medical history. Pregnancy and menstrual history. History of falls. Current health, including: Memory and ability to understand (cognition). Emotional well-being. Home life and relationship well-being. Sexual activity and sexual health. Lifestyle, including: Alcohol, nicotine or tobacco, and drug use. Access to firearms. Diet, exercise, and sleep habits. Work and work environment. Sunscreen use. Safety issues such as seatbelt and bike helmet use. Physical exam Your health care provider will check your: Height and weight. These may be used to calculate your BMI (body mass index). BMI is a measurement that tells if you are at a healthy weight. Waist circumference. This measures the distance around your waistline. This measurement also tells if you are at a healthy weight and may help predict your risk of certain diseases, such as type 2 diabetes and high blood pressure. Heart rate and blood pressure. Body temperature. Skin for abnormal spots. What immunizations do I need?  Vaccines are usually given at various ages, according to a schedule. Your health care provider will recommend vaccines for you based on your age, medical history, and lifestyle or other factors, such as travel or where you work. What tests do I need? Screening Your health care provider may recommend screening tests for certain conditions. This may include: Lipid and cholesterol levels. Hepatitis C test. Hepatitis B test. HIV (human immunodeficiency virus) test. STI (sexually transmitted infection) testing, if you are at  risk. Lung cancer screening. Colorectal cancer screening. Diabetes screening. This is done by checking your blood sugar (glucose) after you have not eaten for a while (fasting). Mammogram. Talk with your health care provider about how often you should have regular mammograms. BRCA-related cancer screening. This may be done if you have a family history of breast, ovarian, tubal, or peritoneal cancers. Bone density scan. This is done to screen for osteoporosis. Talk with your health care provider about your test results, treatment options, and if necessary, the need for more tests. Follow these instructions at home: Eating and drinking  Eat a diet that includes fresh fruits and vegetables, whole grains, lean protein, and low-fat dairy products. Limit your intake of foods with high amounts of sugar, saturated fats, and salt. Take vitamin and mineral supplements as recommended by your health care provider. Do not drink alcohol if your health care provider tells you not to drink. If you drink alcohol: Limit how much you have to 0-1 drink a day. Know how much alcohol is in your drink. In the [place] one drink equals one 12 oz bottle of beer (355 mL), one 5 oz glass of wine (148 mL), or one 1 oz glass of hard liquor (44 mL). Lifestyle Brush your teeth every morning and night with fluoride toothpaste. Floss one time each day. Exercise for at least 30 minutes 5 or more days each week. Do not use any products that contain nicotine or tobacco. These products include cigarettes, chewing tobacco, and vaping devices, such as e-cigarettes. If you need help quitting, ask your health care provider. Do not use drugs. If you are sexually active, practice safe sex. Use a condom or other form of protection in order to prevent STIs. Take aspirin only as told by   your health care provider. Make sure that you understand how much to take and what form to take. Work with your health care provider to find out whether it  is safe and beneficial for you to take aspirin daily. Ask your health care provider if you need to take a cholesterol-lowering medicine (statin). Find healthy ways to manage stress, such as: Meditation, yoga, or listening to music. Journaling. Talking to a trusted person. Spending time with friends and family. Minimize exposure to UV radiation to reduce your risk of skin cancer. Safety Always wear your seat belt while driving or riding in a vehicle. Do not drive: If you have been drinking alcohol. Do not ride with someone who has been drinking. When you are tired or distracted. While texting. If you have been using any mind-altering substances or drugs. Wear a helmet and other protective equipment during sports activities. If you have firearms in your house, make sure you follow all gun safety procedures. What's next? Visit your health care provider once a year for an annual wellness visit. Ask your health care provider how often you should have your eyes and teeth checked. Stay up to date on all vaccines. This information is not intended to replace advice given to you by your health care provider. Make sure you discuss any questions you have with your health care provider. Document Revised: 10/08/2020 Document Reviewed: 10/08/2020 Elsevier Patient Education  2023 Elsevier Inc.  

## 2021-10-30 ENCOUNTER — Encounter: Payer: Self-pay | Admitting: Family Medicine

## 2021-11-06 ENCOUNTER — Encounter: Payer: Self-pay | Admitting: Family Medicine

## 2021-11-13 ENCOUNTER — Other Ambulatory Visit: Payer: Self-pay | Admitting: Family Medicine

## 2021-11-13 DIAGNOSIS — F419 Anxiety disorder, unspecified: Secondary | ICD-10-CM

## 2021-11-17 ENCOUNTER — Other Ambulatory Visit: Payer: Self-pay | Admitting: Family Medicine

## 2021-11-17 DIAGNOSIS — F419 Anxiety disorder, unspecified: Secondary | ICD-10-CM

## 2021-11-19 ENCOUNTER — Encounter: Payer: Self-pay | Admitting: Family Medicine

## 2021-11-20 ENCOUNTER — Encounter: Payer: Self-pay | Admitting: Family Medicine

## 2021-11-20 ENCOUNTER — Ambulatory Visit (INDEPENDENT_AMBULATORY_CARE_PROVIDER_SITE_OTHER): Payer: Medicare PPO | Admitting: Family Medicine

## 2021-11-20 VITALS — BP 150/70 | HR 63 | Temp 98.3°F | Resp 18 | Ht 64.5 in | Wt 139.6 lb

## 2021-11-20 DIAGNOSIS — F418 Other specified anxiety disorders: Secondary | ICD-10-CM | POA: Diagnosis not present

## 2021-11-20 DIAGNOSIS — F419 Anxiety disorder, unspecified: Secondary | ICD-10-CM | POA: Diagnosis not present

## 2021-11-20 DIAGNOSIS — S81811A Laceration without foreign body, right lower leg, initial encounter: Secondary | ICD-10-CM | POA: Diagnosis not present

## 2021-11-20 DIAGNOSIS — R0683 Snoring: Secondary | ICD-10-CM | POA: Insufficient documentation

## 2021-11-20 MED ORDER — ESCITALOPRAM OXALATE 5 MG PO TABS: 5.0000 mg | ORAL_TABLET | Freq: Every day | ORAL | 1 refills | Status: DC

## 2021-11-20 NOTE — Patient Instructions (Signed)
Cholesterol Content in Foods ?Cholesterol is a waxy, fat-like substance that helps to carry fat in the blood. The body needs cholesterol in small amounts, but too much cholesterol can cause damage to the arteries and heart. ?What foods have cholesterol? ? ?Cholesterol is found in animal-based foods, such as meat, seafood, and dairy. Generally, low-fat dairy and lean meats have less cholesterol than full-fat dairy and fatty meats. The milligrams of cholesterol per serving (mg per serving) of common cholesterol-containing foods are listed below. ?Meats and other proteins ?Egg -- one large whole egg has 186 mg. ?Veal shank -- 4 oz (113 g) has 141 mg. ?Lean ground turkey (93% lean) -- 4 oz (113 g) has 118 mg. ?Fat-trimmed lamb loin -- 4 oz (113 g) has 106 mg. ?Lean ground beef (90% lean) -- 4 oz (113 g) has 100 mg. ?Lobster -- 3.5 oz (99 g) has 90 mg. ?Pork loin chops -- 4 oz (113 g) has 86 mg. ?Canned salmon -- 3.5 oz (99 g) has 83 mg. ?Fat-trimmed beef top loin -- 4 oz (113 g) has 78 mg. ?Frankfurter -- 1 frank (3.5 oz or 99 g) has 77 mg. ?Crab -- 3.5 oz (99 g) has 71 mg. ?Roasted chicken without skin, white meat -- 4 oz (113 g) has 66 mg. ?Light bologna -- 2 oz (57 g) has 45 mg. ?Deli-cut turkey -- 2 oz (57 g) has 31 mg. ?Canned tuna -- 3.5 oz (99 g) has 31 mg. ?Bacon -- 1 oz (28 g) has 29 mg. ?Oysters and mussels (raw) -- 3.5 oz (99 g) has 25 mg. ?Mackerel -- 1 oz (28 g) has 22 mg. ?Trout -- 1 oz (28 g) has 20 mg. ?Pork sausage -- 1 link (1 oz or 28 g) has 17 mg. ?Salmon -- 1 oz (28 g) has 16 mg. ?Tilapia -- 1 oz (28 g) has 14 mg. ?Dairy ?Soft-serve ice cream -- ? cup (4 oz or 86 g) has 103 mg. ?Whole-milk yogurt -- 1 cup (8 oz or 245 g) has 29 mg. ?Cheddar cheese -- 1 oz (28 g) has 28 mg. ?American cheese -- 1 oz (28 g) has 28 mg. ?Whole milk -- 1 cup (8 oz or 250 mL) has 23 mg. ?2% milk -- 1 cup (8 oz or 250 mL) has 18 mg. ?Cream cheese -- 1 tablespoon (Tbsp) (14.5 g) has 15 mg. ?Cottage cheese -- ? cup (4 oz or  113 g) has 14 mg. ?Low-fat (1%) milk -- 1 cup (8 oz or 250 mL) has 10 mg. ?Sour cream -- 1 Tbsp (12 g) has 8.5 mg. ?Low-fat yogurt -- 1 cup (8 oz or 245 g) has 8 mg. ?Nonfat Greek yogurt -- 1 cup (8 oz or 228 g) has 7 mg. ?Half-and-half cream -- 1 Tbsp (15 mL) has 5 mg. ?Fats and oils ?Cod liver oil -- 1 tablespoon (Tbsp) (13.6 g) has 82 mg. ?Butter -- 1 Tbsp (14 g) has 15 mg. ?Lard -- 1 Tbsp (12.8 g) has 14 mg. ?Bacon grease -- 1 Tbsp (12.9 g) has 14 mg. ?Mayonnaise -- 1 Tbsp (13.8 g) has 5-10 mg. ?Margarine -- 1 Tbsp (14 g) has 3-10 mg. ?The items listed above may not be a complete list of foods with cholesterol. Exact amounts of cholesterol in these foods may vary depending on specific ingredients and brands. Contact a dietitian for more information. ?What foods do not have cholesterol? ?Most plant-based foods do not have cholesterol unless you combine them with a food that has   cholesterol. Foods without cholesterol include: ?Grains and cereals. ?Vegetables. ?Fruits. ?Vegetable oils, such as olive, canola, and sunflower oil. ?Legumes, such as peas, beans, and lentils. ?Nuts and seeds. ?Egg whites. ?The items listed above may not be a complete list of foods that do not have cholesterol. Contact a dietitian for more information. ?Summary ?The body needs cholesterol in small amounts, but too much cholesterol can cause damage to the arteries and heart. ?Cholesterol is found in animal-based foods, such as meat, seafood, and dairy. Generally, low-fat dairy and lean meats have less cholesterol than full-fat dairy and fatty meats. ?This information is not intended to replace advice given to you by your health care provider. Make sure you discuss any questions you have with your health care provider. ?Document Revised: 08/22/2020 Document Reviewed: 08/22/2020 ?Elsevier Patient Education ? 2023 Elsevier Inc. ? ?

## 2021-11-20 NOTE — Assessment & Plan Note (Signed)
Decrease lexapro 5 mg to see if side effect subsides

## 2021-11-20 NOTE — Progress Notes (Signed)
Subjective:   By signing my name below, I, Luiz Ochoa, attest that this documentation has been prepared under the direction and in the presence of Ann Held, DO 11/20/2021    Patient ID: Kim Mclaughlin, female    DOB: 1945-08-08, 76 y.o.   MRN: [id number]  Chief Complaint  Patient presents with   Anxiety   Follow-up   Leg Injury    Pt states walked into the handle of the recliner chair. Pt has a skin flap on the outer right calf    HPI Patient is in today for follow up visit.  She has an abrasion on her right lower extremity, which she treated it with Neosporin. She notes that at the time, the wound was bleeding a lot, but she is not experiencing pain in the area that this time. She has been covering the abrasion with a Band-Aid.   She remains compliant with her Lexapro, but has noticed that her left eyelid twitches randomly. She treated the twitch with eyedrops which have improved her symptoms. She has recently had trouble focusing as well after taking Lexapro, and has been less motivated to do certain activities. She denies having symptoms of depression. Reports having difficulty "finding words" when talking to to others.   She has not had a sleep study, but does snore when she sleeps at night.   Of note, she reports that she has been developing cataracts in her eyes.  Past Medical History:  Diagnosis Date   Allergy    Arthritis 07/2014   Right hand, prescribed prednisone-no longer taking. also Rt knww   Brain aneurysm    DVT (deep venous thrombosis) ([place]) 2015   very small; treated with compression and heat, no other problems since   GERD (gastroesophageal reflux disease)    patient denies states she does have hx of peptic ulcer many years   Hepatitis    in 10 th grade  (not sure type- not hep C) - treated  no other problems   Hyperlipidemia    diet controlled   Memory loss    MVP (mitral valve prolapse)    no problems, MD told patient she could not  hear it   OSA (obstructive sleep apnea) 10/08/2021   Osteoarthritis    Osteopenia    [facility]   PONV (postoperative nausea and vomiting)    with ear surgery only - No other problems with other surgeries   Precancerous lesion 12/2014   Treated with cream only   TGA (transient global amnesia)    Varicose veins    Vitamin D deficiency     Past Surgical History:  Procedure Laterality Date   APPENDECTOMY     BUNIONECTOMY WITH HAMMERTOE RECONSTRUCTION Left 04/22/2016   Procedure: MODIFIED MCBRIDE BUNIONECTOMY,  LEFT SECOND HAMMERTOE CORRECTION;  Surgeon: Wylene Simmer, MD;  Location: [facility];  Service: Orthopedics;  Laterality: Left;   COLONOSCOPY     07/2012 Olevia Perches - [facility]/father - polyps   colonoscopy with polypectomy  07/2012    hyperplastic;Dr Brodie   DILATION AND CURETTAGE OF UTERUS     DILATION AND CURETTAGE OF UTERUS N/A 09/03/2014   Procedure: DILATATION AND CURETTAGE;  Surgeon: Harle Battiest, MD;  Location: [facility];  Service: Gynecology;  Laterality: N/A;   ENDOVENOUS ABLATION SAPHENOUS VEIN W/ LASER Left 01-31-2014   EVLA  left greater saphenous vein  by Curt Jews MD   ENDOVENOUS ABLATION SAPHENOUS VEIN W/ LASER Right 03-14-2014   endovenous  laser ablation right greater saphenous vein  by Curt Jews MD   FOOT ARTHRODESIS Left 04/22/2016   Procedure: LEFT FIRST TARSAL METATARSAL ARTHRODESIS;  Surgeon: Wylene Simmer, MD;  Location: [facility];  Service: Orthopedics;  Laterality: Left;   G 3 P 3     HYSTEROSCOPY WITH D & C N/A 03/25/2016   Procedure: DILATATION AND CURETTAGE /HYSTEROSCOPY;  Surgeon: Jerelyn Charles, MD;  Location: [facility];  Service: Gynecology;  Laterality: N/A;  WITH ULTRASOUND GUIDANCE   left ear surgery     STAPEDECTOMY     TARSAL METATARSAL FUSION WITH WEIL OSTEOTOMY Left 04/22/2016   Procedure: 2-4 TARSAL METATARSAL FUSION WITH WEIL OSTEOTOMY;  Surgeon: Wylene Simmer, MD;  Location: [facility];  Service:  Orthopedics;  Laterality: Left;   TUBAL LIGATION      Family History  Problem Relation Age of Onset   Pancreatic cancer Mother    Miscarriages / [place] Mother    Thyroid disease Mother        partial thyroidectomy ; S/P RAI   Heart attack Father        in early 27s   Colon cancer Father    Seizures Sister    Ovarian cancer Maternal Aunt    Breast cancer Paternal Aunt    Aneurysm Paternal Uncle        brain   Colon cancer Paternal Grandmother    Stroke Paternal Grandmother        > 49   Heart attack Paternal Grandfather        in 6s   Cancer Other        among paternal sibs (colon, breast, bone)   Diabetes Neg Hx    Stomach cancer Neg Hx    Ulcerative colitis Neg Hx    Esophageal cancer Neg Hx    Rectal cancer Neg Hx     Social History   Socioeconomic History   Marital status: Married    Spouse name: [name]   Number of children: Not on file   Years of education: Not on file   Highest education level: Not on file  Occupational History   Occupation: retired  Tobacco Use   Smoking status: Former    Types: Cigarettes    Quit date: 04/27/1963    Years since quitting: 58.6   Smokeless tobacco: Never   Tobacco comments:    Smoked < 6 mos;< 1 pack total;quit 1965  Vaping Use   Vaping Use: Never used  Substance and Sexual Activity   Alcohol use: No   Drug use: No   Sexual activity: Yes    Birth control/protection: Post-menopausal  Other Topics Concern   Not on file  Social History Narrative   Lives with husband   Right Handed   Drinks average 2-3 cups caffeine daily   Social Determinants of Health   Financial Resource Strain: Low Risk  (02/25/2021)   Overall Financial Resource Strain (CARDIA)    Difficulty of Paying Living Expenses: Not hard at all  Food Insecurity: No Food Insecurity (02/25/2021)   Hunger Vital Sign    Worried About Running Out of Food in the Last Year: Never true    Ran Out of Food in the Last Year: Never true  Transportation Needs:  No Transportation Needs (02/25/2021)   PRAPARE - Hydrologist (Medical): No    Lack of Transportation (Non-Medical): No  Physical Activity: Inactive (02/25/2021)   Exercise Vital Sign  Days of Exercise per Week: 0 days    Minutes of Exercise per Session: 0 min  Stress: No Stress Concern Present (02/25/2021)   [place]    Feeling of Stress : Not at all  Social Connections: Moderately Integrated (02/25/2021)   Social Connection and Isolation Panel [NHANES]    Frequency of Communication with Friends and Family: More than three times a week    Frequency of Social Gatherings with Friends and Family: More than three times a week    Attends Religious Services: More than 4 times per year    Active Member of [employer] or Organizations: No    Attends Archivist Meetings: Never    Marital Status: Married  Human resources officer Violence: Not At Risk (02/25/2021)   Humiliation, Afraid, Rape, and Kick questionnaire    Fear of Current or Ex-Partner: No    Emotionally Abused: No    Physically Abused: No    Sexually Abused: No    Outpatient Medications Prior to Visit  Medication Sig Dispense Refill   ALPRAZolam (XANAX) 0.25 MG tablet Take 1 tablet (0.25 mg total) by mouth 2 (two) times daily as needed for anxiety. 20 tablet 0   atorvastatin (LIPITOR) 80 MG tablet Take 1 tablet (80 mg total) by mouth daily. 90 tablet 1   Boswellia-Glucosamine-Vit D (OSTEO BI-FLEX ONE PER DAY PO) Take 1 tablet by mouth 2 (two) times daily.     cetirizine (ZYRTEC) 10 MG tablet Take 10 mg by mouth daily as needed for allergies.     Cholecalciferol (VITAMIN D-3) 25 MCG (1000 UT) CAPS Take 1 capsule by mouth 2 (two) times daily.     Glucosamine HCl-MSM (MSM GLUCOSAMINE PO) Take 2 tablets by mouth daily.     Multiple Vitamins-Minerals (MULTIVITAMIN ADULTS 50+) TABS Take 1 tablet by mouth daily.     NONFORMULARY OR COMPOUNDED  ITEM compression socks 20-30 mm/ hg 1 each 0   triamcinolone (NASACORT) 55 MCG/ACT AERO nasal inhaler Place 2 sprays into the nose daily as needed (allergies).      triamcinolone cream (KENALOG) 0.1 % Apply 1 application topically 2 (two) times daily as needed (eczema.).      Ubiquinol 100 MG CAPS Take 1 capsule by mouth 2 (two) times daily.     escitalopram (LEXAPRO) 10 MG tablet TAKE 1 TABLET BY MOUTH EVERY DAY 90 tablet 1   alendronate (FOSAMAX) 70 MG tablet Take 1 tablet (70 mg total) by mouth every 7 (seven) days. Take with a full glass of water on an empty stomach. (Patient not taking: Reported on 11/20/2021) 4 tablet 11   No facility-administered medications prior to visit.    Allergies  Allergen Reactions   Macrobid  [Nitrofurantoin Macrocrystal] Other (See Comments)   Doxycycline Hives   Pravastatin Sodium     REACTION: shin pain   Amoxicillin-Pot Clavulanate     GI symptoms; abdominal pain & nausea NOTE: able to take Ampicillin   Augmentin [Amoxicillin-Pot Clavulanate] Nausea Only    Upsets stomach   Codeine Nausea Only   Methylphenidate Hcl Palpitations   Methylprednisolone Rash    Rash and redness on face   Sulfamethoxazole-Trimethoprim Nausea Only     nausea    Review of Systems  Eyes:        (+) Left eyelid twitch  Skin:        (+) Abrasion on her right lower extremity  Neurological:  Positive for speech change (while taking Lexapro).  Psychiatric/Behavioral:         (+) Snoring       Objective:    Physical Exam Constitutional:      Appearance: Normal appearance. She is not ill-appearing.  HENT:     Head: Normocephalic and atraumatic.     Right Ear: External ear normal.     Left Ear: External ear normal.  Eyes:     Extraocular Movements: Extraocular movements intact.     Pupils: Pupils are equal, round, and reactive to light.  Cardiovascular:     Rate and Rhythm: Normal rate and regular rhythm.     Pulses: Normal pulses.     Heart sounds: Normal  heart sounds. No murmur heard.    No gallop.  Pulmonary:     Effort: No respiratory distress.     Breath sounds: No wheezing or rales.  Skin:    General: Skin is warm and dry.     Findings: Erythema present.     Comments: Skin tear lat Right calf No signs of infection  Healing well  Neurological:     Mental Status: She is alert.  Psychiatric:        Judgment: Judgment normal.     BP (!) 150/70 (BP Location: Left Arm, Patient Position: Sitting, Cuff Size: Normal)   Pulse 63   Temp 98.3 F (36.8 C) (Oral)   Resp 18   Ht 5' 4.5" (1.638 m)   Wt 139 lb 9.6 oz (63.3 kg)   SpO2 97%   BMI 23.59 kg/m  Wt Readings from Last 3 Encounters:  11/20/21 139 lb 9.6 oz (63.3 kg)  10/22/21 136 lb 12.8 oz (62.1 kg)  10/02/21 137 lb 6.4 oz (62.3 kg)    Diabetic Foot Exam - Simple   No data filed    Lab Results  Component Value Date   WBC 5.8 10/22/2021   HGB 14.1 10/22/2021   HCT 42.2 10/22/2021   PLT 270.0 10/22/2021   GLUCOSE 96 10/22/2021   CHOL 175 10/22/2021   TRIG 51.0 10/22/2021   HDL 104.30 10/22/2021   LDLDIRECT 188.0 08/08/2012   LDLCALC 61 10/22/2021   ALT 28 10/22/2021   AST 25 10/22/2021   NA 141 10/22/2021   K 4.4 10/22/2021   CL 104 10/22/2021   CREATININE 0.88 10/22/2021   BUN 14 10/22/2021   CO2 29 10/22/2021   TSH 1.74 10/02/2021   INR 1.0 05/01/2020   HGBA1C 5.7 (H) 05/02/2020    Lab Results  Component Value Date   TSH 1.74 10/02/2021   Lab Results  Component Value Date   WBC 5.8 10/22/2021   HGB 14.1 10/22/2021   HCT 42.2 10/22/2021   MCV 95.1 10/22/2021   PLT 270.0 10/22/2021   Lab Results  Component Value Date   NA 141 10/22/2021   K 4.4 10/22/2021   CO2 29 10/22/2021   GLUCOSE 96 10/22/2021   BUN 14 10/22/2021   CREATININE 0.88 10/22/2021   BILITOT 0.9 10/22/2021   ALKPHOS 71 10/22/2021   AST 25 10/22/2021   ALT 28 10/22/2021   PROT 6.9 10/22/2021   ALBUMIN 4.5 10/22/2021   CALCIUM 10.5 10/22/2021   ANIONGAP 13 09/30/2021    GFR 63.83 10/22/2021   Lab Results  Component Value Date   CHOL 175 10/22/2021   Lab Results  Component Value Date   HDL 104.30 10/22/2021   Lab Results  Component Value Date   LDLCALC 61 10/22/2021   Lab Results  Component Value Date  TRIG 51.0 10/22/2021   Lab Results  Component Value Date   CHOLHDL 2 10/22/2021   Lab Results  Component Value Date   HGBA1C 5.7 (H) 05/02/2020       Assessment & Plan:   Problem List Items Addressed This Visit       Unprioritized   Snoring    Pt has app with neuro She will discuss it with them      Skin tear of lower leg without complication, right, initial encounter    Healing well  Tetanus is utd      Depression with anxiety - Primary    Decrease lexapro 5 mg to see if side effect subsides       Relevant Medications   escitalopram (LEXAPRO) 5 MG tablet   Other Visit Diagnoses     Anxiety       Relevant Medications   escitalopram (LEXAPRO) 5 MG tablet        Meds ordered this encounter  Medications   escitalopram (LEXAPRO) 5 MG tablet    Sig: Take 1 tablet (5 mg total) by mouth daily.    Dispense:  90 tablet    Refill:  1    I, Ann Held, DO, personally preformed the services described in this documentation.  All medical record entries made by the scribe were at my direction and in my presence.  I have reviewed the chart and discharge instructions (if applicable) and agree that the record reflects my personal performance and is accurate and complete. 11/20/2021   I,Tinashe Williams,acting as a scribe for Ann Held, DO.,have documented all relevant documentation on the behalf of Ann Held, DO,as directed by  Ann Held, DO while in the presence of Ann Held, DO.    Ann Held, DO

## 2021-11-20 NOTE — Assessment & Plan Note (Signed)
Healing well  Tetanus is utd

## 2021-11-20 NOTE — Assessment & Plan Note (Signed)
Pt has app with neuro She will discuss it with them

## 2021-11-27 ENCOUNTER — Encounter: Payer: Self-pay | Admitting: Family Medicine

## 2021-11-27 ENCOUNTER — Telehealth: Payer: Self-pay | Admitting: Family Medicine

## 2021-11-27 NOTE — Telephone Encounter (Signed)
Pt stated she was originally given '10mg'$  of lexapro but that made her groggy, but the '5mg'$  is still making her jittery. She would like to know if she can just take '10mg'$  a day just split up. Taking half at night and half the next morning. Please advise.

## 2021-12-02 NOTE — Telephone Encounter (Signed)
Responded to pt's Mychart message on 11/30/21

## 2021-12-14 ENCOUNTER — Ambulatory Visit (INDEPENDENT_AMBULATORY_CARE_PROVIDER_SITE_OTHER): Payer: Medicare PPO | Admitting: Family Medicine

## 2021-12-14 ENCOUNTER — Encounter: Payer: Self-pay | Admitting: Family Medicine

## 2021-12-14 VITALS — BP 128/68 | HR 66 | Temp 98.3°F | Resp 18 | Ht 64.5 in | Wt 137.6 lb

## 2021-12-14 DIAGNOSIS — M5442 Lumbago with sciatica, left side: Secondary | ICD-10-CM | POA: Diagnosis not present

## 2021-12-14 MED ORDER — TIZANIDINE HCL 4 MG PO TABS: 4.0000 mg | ORAL_TABLET | Freq: Four times a day (QID) | ORAL | 0 refills | Status: DC | PRN

## 2021-12-14 MED ORDER — MELOXICAM 7.5 MG PO TABS: 7.5000 mg | ORAL_TABLET | Freq: Every day | ORAL | 0 refills | Status: DC

## 2021-12-14 MED ORDER — PREDNISONE 10 MG PO TABS: ORAL_TABLET | ORAL | 0 refills | Status: DC

## 2021-12-14 NOTE — Patient Instructions (Signed)
Low Back Sprain or Strain Rehab Ask your health care provider which exercises are safe for you. Do exercises exactly as told by your health care provider and adjust them as directed. It is normal to feel mild stretching, pulling, tightness, or discomfort as you do these exercises. Stop right away if you feel sudden pain or your pain gets worse. Do not begin these exercises until told by your health care provider. Stretching and range-of-motion exercises These exercises warm up your muscles and joints and improve the movement and flexibility of your back. These exercises also help to relieve pain, numbness, and tingling. Lumbar rotation  Lie on your back on a firm bed or the floor with your knees bent. Straighten your arms out to your sides so each arm forms a 90-degree angle (right angle) with a side of your body. Slowly move (rotate) both of your knees to one side of your body until you feel a stretch in your lower back (lumbar). Try not to let your shoulders lift off the floor. Hold this position for __________ seconds. Tense your abdominal muscles and slowly move your knees back to the starting position. Repeat this exercise on the other side of your body. Repeat __________ times. Complete this exercise __________ times a day. Single knee to chest  Lie on your back on a firm bed or the floor with both legs straight. Bend one of your knees. Use your hands to move your knee up toward your chest until you feel a gentle stretch in your lower back and buttock. Hold your leg in this position by holding on to the front of your knee. Keep your other leg as straight as possible. Hold this position for __________ seconds. Slowly return to the starting position. Repeat with your other leg. Repeat __________ times. Complete this exercise __________ times a day. Prone extension on elbows  Lie on your abdomen on a firm bed or the floor (prone position). Prop yourself up on your elbows. Use your arms  to help lift your chest up until you feel a gentle stretch in your abdomen and your lower back. This will place some of your body weight on your elbows. If this is uncomfortable, try stacking pillows under your chest. Your hips should stay down, against the surface that you are lying on. Keep your hip and back muscles relaxed. Hold this position for __________ seconds. Slowly relax your upper body and return to the starting position. Repeat __________ times. Complete this exercise __________ times a day. Strengthening exercises These exercises build strength and endurance in your back. Endurance is the ability to use your muscles for a long time, even after they get tired. Pelvic tilt This exercise strengthens the muscles that lie deep in the abdomen. Lie on your back on a firm bed or the floor with your legs extended. Bend your knees so they are pointing toward the ceiling and your feet are flat on the floor. Tighten your lower abdominal muscles to press your lower back against the floor. This motion will tilt your pelvis so your tailbone points up toward the ceiling instead of pointing to your feet or the floor. To help with this exercise, you may place a small towel under your lower back and try to push your back into the towel. Hold this position for __________ seconds. Let your muscles relax completely before you repeat this exercise. Repeat __________ times. Complete this exercise __________ times a day. Alternating arm and leg raises  Get on your hands   and knees on a firm surface. If you are on a hard floor, you may want to use padding, such as an exercise mat, to cushion your knees. Line up your arms and legs. Your hands should be directly below your shoulders, and your knees should be directly below your hips. Lift your left leg behind you. At the same time, raise your right arm and straighten it in front of you. Do not lift your leg higher than your hip. Do not lift your arm higher  than your shoulder. Keep your abdominal and back muscles tight. Keep your hips facing the ground. Do not arch your back. Keep your balance carefully, and do not hold your breath. Hold this position for __________ seconds. Slowly return to the starting position. Repeat with your right leg and your left arm. Repeat __________ times. Complete this exercise __________ times a day. Abdominal set with straight leg raise  Lie on your back on a firm bed or the floor. Bend one of your knees and keep your other leg straight. Tense your abdominal muscles and lift your straight leg up, 4-6 inches (10-15 cm) off the ground. Keep your abdominal muscles tight and hold this position for __________ seconds. Do not hold your breath. Do not arch your back. Keep it flat against the ground. Keep your abdominal muscles tense as you slowly lower your leg back to the starting position. Repeat with your other leg. Repeat __________ times. Complete this exercise __________ times a day. Single leg lower with bent knees Lie on your back on a firm bed or the floor. Tense your abdominal muscles and lift your feet off the floor, one foot at a time, so your knees and hips are bent in 90-degree angles (right angles). Your knees should be over your hips and your lower legs should be parallel to the floor. Keeping your abdominal muscles tense and your knee bent, slowly lower one of your legs so your toe touches the ground. Lift your leg back up to return to the starting position. Do not hold your breath. Do not let your back arch. Keep your back flat against the ground. Repeat with your other leg. Repeat __________ times. Complete this exercise __________ times a day. Posture and body mechanics Good posture and healthy body mechanics can help to relieve stress in your body's tissues and joints. Body mechanics refers to the movements and positions of your body while you do your daily activities. Posture is part of body  mechanics. Good posture means: Your spine is in its natural S-curve position (neutral). Your shoulders are pulled back slightly. Your head is not tipped forward (neutral). Follow these guidelines to improve your posture and body mechanics in your everyday activities. Standing  When standing, keep your spine neutral and your feet about hip-width apart. Keep a slight bend in your knees. Your ears, shoulders, and hips should line up. When you do a task in which you stand in one place for a long time, place one foot up on a stable object that is 2-4 inches (5-10 cm) high, such as a footstool. This helps keep your spine neutral. Sitting  When sitting, keep your spine neutral and keep your feet flat on the floor. Use a footrest, if necessary, and keep your thighs parallel to the floor. Avoid rounding your shoulders, and avoid tilting your head forward. When working at a desk or a computer, keep your desk at a height where your hands are slightly lower than your elbows. Slide your   chair under your desk so you are close enough to maintain good posture. When working at a computer, place your monitor at a height where you are looking straight ahead and you do not have to tilt your head forward or downward to look at the screen. Resting When lying down and resting, avoid positions that are most painful for you. If you have pain with activities such as sitting, bending, stooping, or squatting, lie in a position in which your body does not bend very much. For example, avoid curling up on your side with your arms and knees near your chest (fetal position). If you have pain with activities such as standing for a long time or reaching with your arms, lie with your spine in a neutral position and bend your knees slightly. Try the following positions: Lying on your side with a pillow between your knees. Lying on your back with a pillow under your knees. Lifting  When lifting objects, keep your feet at least  shoulder-width apart and tighten your abdominal muscles. Bend your knees and hips and keep your spine neutral. It is important to lift using the strength of your legs, not your back. Do not lock your knees straight out. Always ask for help to lift heavy or awkward objects. This information is not intended to replace advice given to you by your health care provider. Make sure you discuss any questions you have with your health care provider. Document Revised: 06/30/2020 Document Reviewed: 06/30/2020 Elsevier Patient Education  [place].

## 2021-12-14 NOTE — Progress Notes (Signed)
Subjective:   By signing my name below, I, [name], attest that this documentation has been prepared under the direction and in the presence of Ann Held DO 12/14/2021   Patient ID: Kim Mclaughlin, female    DOB: 1945-07-27, 76 y.o.   MRN: [id number]  Chief Complaint  Patient presents with   Back Pain    Pt states pain started last week and states pain started the lower left back and goes into her left thigh. No falls or injuries. No redness or swelling    HPI Patient is in today for an office visit. She is accompanied by her partner   She complains of back and left thigh pain that appeared about a week ago. She noticed the pain when she was getting into the car. Pain radiates from her lower back down her left thigh. She denies of any tingling or numbness in her lower extremities. She has tried multiple OTC/natural remedies such as deep blue but symptoms are not resolved. She has also tried OTC pain medications such as Tylenol and reports that symptoms have not improved. She does use a heating pad on the area which helps relieve symptoms  Past Medical History:  Diagnosis Date   Allergy    Arthritis 07/2014   Right hand, prescribed prednisone-no longer taking. also Rt knww   Brain aneurysm    DVT (deep venous thrombosis) ([place]) 2015   very small; treated with compression and heat, no other problems since   GERD (gastroesophageal reflux disease)    patient denies states she does have hx of peptic ulcer many years   Hepatitis    in 10 th grade  (not sure type- not hep C) - treated  no other problems   Hyperlipidemia    diet controlled   Memory loss    MVP (mitral valve prolapse)    no problems, MD told patient she could not hear it   OSA (obstructive sleep apnea) 10/08/2021   Osteoarthritis    Osteopenia    [facility]   PONV (postoperative nausea and vomiting)    with ear surgery only - No other problems with other surgeries   Precancerous lesion 12/2014    Treated with cream only   TGA (transient global amnesia)    Varicose veins    Vitamin D deficiency     Past Surgical History:  Procedure Laterality Date   APPENDECTOMY     BUNIONECTOMY WITH HAMMERTOE RECONSTRUCTION Left 04/22/2016   Procedure: MODIFIED MCBRIDE BUNIONECTOMY,  LEFT SECOND HAMMERTOE CORRECTION;  Surgeon: Wylene Simmer, MD;  Location: [facility];  Service: Orthopedics;  Laterality: Left;   COLONOSCOPY     07/2012 Olevia Perches - [facility]/father - polyps   colonoscopy with polypectomy  07/2012    hyperplastic;Dr Brodie   DILATION AND CURETTAGE OF UTERUS     DILATION AND CURETTAGE OF UTERUS N/A 09/03/2014   Procedure: DILATATION AND CURETTAGE;  Surgeon: Harle Battiest, MD;  Location: [facility];  Service: Gynecology;  Laterality: N/A;   ENDOVENOUS ABLATION SAPHENOUS VEIN W/ LASER Left 01-31-2014   EVLA  left greater saphenous vein  by Curt Jews MD   ENDOVENOUS ABLATION SAPHENOUS VEIN W/ LASER Right 03-14-2014   endovenous laser ablation right greater saphenous vein  by Curt Jews MD   FOOT ARTHRODESIS Left 04/22/2016   Procedure: LEFT FIRST TARSAL METATARSAL ARTHRODESIS;  Surgeon: Wylene Simmer, MD;  Location: [place];  Service: Orthopedics;  Laterality: Left;   G  3 P 3     HYSTEROSCOPY WITH D & C N/A 03/25/2016   Procedure: DILATATION AND CURETTAGE /HYSTEROSCOPY;  Surgeon: Jerelyn Charles, MD;  Location: [facility];  Service: Gynecology;  Laterality: N/A;  WITH ULTRASOUND GUIDANCE   left ear surgery     STAPEDECTOMY     TARSAL METATARSAL FUSION WITH WEIL OSTEOTOMY Left 04/22/2016   Procedure: 2-4 TARSAL METATARSAL FUSION WITH WEIL OSTEOTOMY;  Surgeon: Wylene Simmer, MD;  Location: [place] [facility];  Service: Orthopedics;  Laterality: Left;   TUBAL LIGATION      Family History  Problem Relation Age of Onset   Pancreatic cancer Mother    Miscarriages / [place] Mother    Thyroid disease Mother        partial thyroidectomy ; S/P RAI    Heart attack Father        in early 59s   Colon cancer Father    Seizures Sister    Ovarian cancer Maternal Aunt    Breast cancer Paternal Aunt    Aneurysm Paternal Uncle        brain   Colon cancer Paternal Grandmother    Stroke Paternal Grandmother        > 19   Heart attack Paternal Grandfather        in 35s   Cancer Other        among paternal sibs (colon, breast, bone)   Diabetes Neg Hx    Stomach cancer Neg Hx    Ulcerative colitis Neg Hx    Esophageal cancer Neg Hx    Rectal cancer Neg Hx     Social History   Socioeconomic History   Marital status: Married    Spouse name: [name]   Number of children: Not on file   Years of education: Not on file   Highest education level: Not on file  Occupational History   Occupation: retired  Tobacco Use   Smoking status: Former    Types: Cigarettes    Quit date: 04/27/1963    Years since quitting: 58.6   Smokeless tobacco: Never   Tobacco comments:    Smoked < 6 mos;< 1 pack total;quit 1965  Vaping Use   Vaping Use: Never used  Substance and Sexual Activity   Alcohol use: No   Drug use: No   Sexual activity: Yes    Birth control/protection: Post-menopausal  Other Topics Concern   Not on file  Social History Narrative   Lives with husband   Right Handed   Drinks average 2-3 cups caffeine daily   Social Determinants of Health   Financial Resource Strain: Low Risk  (02/25/2021)   Overall Financial Resource Strain (CARDIA)    Difficulty of Paying Living Expenses: Not hard at all  Food Insecurity: No Food Insecurity (02/25/2021)   Hunger Vital Sign    Worried About Running Out of Food in the Last Year: Never true    Ran Out of Food in the Last Year: Never true  Transportation Needs: No Transportation Needs (02/25/2021)   PRAPARE - Hydrologist (Medical): No    Lack of Transportation (Non-Medical): No  Physical Activity: Inactive (02/25/2021)   Exercise Vital Sign    Days of Exercise per  Week: 0 days    Minutes of Exercise per Session: 0 min  Stress: No Stress Concern Present (02/25/2021)   [place]    Feeling of Stress : Not at all  Social Connections: Moderately Integrated (02/25/2021)   Social Connection and Isolation Panel [NHANES]    Frequency of Communication with Friends and Family: More than three times a week    Frequency of Social Gatherings with Friends and Family: More than three times a week    Attends Religious Services: More than 4 times per year    Active Member of [employer] or Organizations: No    Attends Archivist Meetings: Never    Marital Status: Married  Human resources officer Violence: Not At Risk (02/25/2021)   Humiliation, Afraid, Rape, and Kick questionnaire    Fear of Current or Ex-Partner: No    Emotionally Abused: No    Physically Abused: No    Sexually Abused: No    Outpatient Medications Prior to Visit  Medication Sig Dispense Refill   ALPRAZolam (XANAX) 0.25 MG tablet Take 1 tablet (0.25 mg total) by mouth 2 (two) times daily as needed for anxiety. 20 tablet 0   atorvastatin (LIPITOR) 80 MG tablet Take 1 tablet (80 mg total) by mouth daily. 90 tablet 1   Boswellia-Glucosamine-Vit D (OSTEO BI-FLEX ONE PER DAY PO) Take 1 tablet by mouth 2 (two) times daily.     cetirizine (ZYRTEC) 10 MG tablet Take 10 mg by mouth daily as needed for allergies.     Cholecalciferol (VITAMIN D-3) 25 MCG (1000 UT) CAPS Take 1 capsule by mouth 2 (two) times daily.     escitalopram (LEXAPRO) 5 MG tablet Take 1 tablet (5 mg total) by mouth daily. 90 tablet 1   Glucosamine HCl-MSM (MSM GLUCOSAMINE PO) Take 2 tablets by mouth daily.     Multiple Vitamins-Minerals (MULTIVITAMIN ADULTS 50+) TABS Take 1 tablet by mouth daily.     NONFORMULARY OR COMPOUNDED ITEM compression socks 20-30 mm/ hg 1 each 0   triamcinolone (NASACORT) 55 MCG/ACT AERO nasal inhaler Place 2 sprays into the nose daily as needed  (allergies).      triamcinolone cream (KENALOG) 0.1 % Apply 1 application topically 2 (two) times daily as needed (eczema.).      Ubiquinol 100 MG CAPS Take 1 capsule by mouth 2 (two) times daily.     No facility-administered medications prior to visit.    Allergies  Allergen Reactions   Macrobid  [Nitrofurantoin Macrocrystal] Other (See Comments)   Doxycycline Hives   Pravastatin Sodium     REACTION: shin pain   Amoxicillin-Pot Clavulanate     GI symptoms; abdominal pain & nausea NOTE: able to take Ampicillin   Augmentin [Amoxicillin-Pot Clavulanate] Nausea Only    Upsets stomach   Codeine Nausea Only   Methylphenidate Hcl Palpitations   Methylprednisolone Rash    Rash and redness on face   Sulfamethoxazole-Trimethoprim Nausea Only     nausea    Review of Systems  Constitutional:  Negative for fever and malaise/fatigue.  HENT:  Negative for congestion.   Eyes:  Negative for blurred vision.  Respiratory:  Negative for shortness of breath.   Cardiovascular:  Negative for chest pain, palpitations and leg swelling.  Gastrointestinal:  Negative for abdominal pain, blood in stool and nausea.  Genitourinary:  Negative for dysuria and frequency.  Musculoskeletal:  Positive for back pain (Radiates down left side of thigh). Negative for falls.  Skin:  Negative for rash.  Neurological:  Negative for dizziness, tingling, loss of consciousness and headaches.       (-) Numbness  Endo/Heme/Allergies:  Negative for environmental allergies.  Psychiatric/Behavioral:  Negative for depression. The patient is not nervous/anxious.  Objective:    Physical Exam Vitals and nursing note reviewed.  Constitutional:      General: She is not in acute distress.    Appearance: Normal appearance. She is not ill-appearing.  HENT:     Head: Normocephalic and atraumatic.     Right Ear: External ear normal.     Left Ear: External ear normal.  Eyes:     Extraocular Movements: Extraocular  movements intact.     Pupils: Pupils are equal, round, and reactive to light.  Cardiovascular:     Rate and Rhythm: Normal rate and regular rhythm.     Heart sounds: Normal heart sounds. No murmur heard.    No gallop.  Pulmonary:     Effort: Pulmonary effort is normal. No respiratory distress.     Breath sounds: Normal breath sounds. No wheezing or rales.  Musculoskeletal:     Comments: 5/5 strength in lower extremities     Skin:    General: Skin is warm and dry.  Neurological:     Mental Status: She is alert and oriented to person, place, and time.  Psychiatric:        Judgment: Judgment normal.     BP 128/68 (BP Location: Left Arm, Patient Position: Sitting, Cuff Size: Normal)   Pulse 66   Temp 98.3 F (36.8 C) (Oral)   Resp 18   Ht 5' 4.5" (1.638 m)   Wt 137 lb 9.6 oz (62.4 kg)   SpO2 96%   BMI 23.25 kg/m  Wt Readings from Last 3 Encounters:  12/14/21 137 lb 9.6 oz (62.4 kg)  11/20/21 139 lb 9.6 oz (63.3 kg)  10/22/21 136 lb 12.8 oz (62.1 kg)    Diabetic Foot Exam - Simple   No data filed    Lab Results  Component Value Date   WBC 5.8 10/22/2021   HGB 14.1 10/22/2021   HCT 42.2 10/22/2021   PLT 270.0 10/22/2021   GLUCOSE 96 10/22/2021   CHOL 175 10/22/2021   TRIG 51.0 10/22/2021   HDL 104.30 10/22/2021   LDLDIRECT 188.0 08/08/2012   LDLCALC 61 10/22/2021   ALT 28 10/22/2021   AST 25 10/22/2021   NA 141 10/22/2021   K 4.4 10/22/2021   CL 104 10/22/2021   CREATININE 0.88 10/22/2021   BUN 14 10/22/2021   CO2 29 10/22/2021   TSH 1.74 10/02/2021   INR 1.0 05/01/2020   HGBA1C 5.7 (H) 05/02/2020    Lab Results  Component Value Date   TSH 1.74 10/02/2021   Lab Results  Component Value Date   WBC 5.8 10/22/2021   HGB 14.1 10/22/2021   HCT 42.2 10/22/2021   MCV 95.1 10/22/2021   PLT 270.0 10/22/2021   Lab Results  Component Value Date   NA 141 10/22/2021   K 4.4 10/22/2021   CO2 29 10/22/2021   GLUCOSE 96 10/22/2021   BUN 14 10/22/2021    CREATININE 0.88 10/22/2021   BILITOT 0.9 10/22/2021   ALKPHOS 71 10/22/2021   AST 25 10/22/2021   ALT 28 10/22/2021   PROT 6.9 10/22/2021   ALBUMIN 4.5 10/22/2021   CALCIUM 10.5 10/22/2021   ANIONGAP 13 09/30/2021   GFR 63.83 10/22/2021   Lab Results  Component Value Date   CHOL 175 10/22/2021   Lab Results  Component Value Date   HDL 104.30 10/22/2021   Lab Results  Component Value Date   LDLCALC 61 10/22/2021   Lab Results  Component Value Date   TRIG 51.0  10/22/2021   Lab Results  Component Value Date   CHOLHDL 2 10/22/2021   Lab Results  Component Value Date   HGBA1C 5.7 (H) 05/02/2020       Assessment & Plan:   Problem List Items Addressed This Visit       Unprioritized   Acute left-sided low back pain with left-sided sciatica - Primary    Muscle relaxers  pred taper  Can use mobic after pred finished       Relevant Medications   tiZANidine (ZANAFLEX) 4 MG tablet   meloxicam (MOBIC) 7.5 MG tablet   predniSONE (DELTASONE) 10 MG tablet   Meds ordered this encounter  Medications   tiZANidine (ZANAFLEX) 4 MG tablet    Sig: Take 1 tablet (4 mg total) by mouth every 6 (six) hours as needed for muscle spasms.    Dispense:  30 tablet    Refill:  0   meloxicam (MOBIC) 7.5 MG tablet    Sig: Take 1 tablet (7.5 mg total) by mouth daily.    Dispense:  30 tablet    Refill:  0   predniSONE (DELTASONE) 10 MG tablet    Sig: TAKE 3 TABLETS PO QD FOR 3 DAYS THEN TAKE 2 TABLETS PO QD FOR 3 DAYS THEN TAKE 1 TABLET PO QD FOR 3 DAYS THEN TAKE 1/2 TAB PO QD FOR 3 DAYS    Dispense:  20 tablet    Refill:  0    I, Ann Held, DO, personally preformed the services described in this documentation.  All medical record entries made by the scribe were at my direction and in my presence.  I have reviewed the chart and discharge instructions (if applicable) and agree that the record reflects my personal performance and is accurate and complete.  12/14/2021   I,Amber Collins,acting as a scribe for Ann Held, DO.,have documented all relevant documentation on the behalf of Ann Held, DO,as directed by  Ann Held, DO while in the presence of Ann Held, DO.    Ann Held, DO

## 2021-12-14 NOTE — Assessment & Plan Note (Signed)
Muscle relaxers  pred taper  Can use mobic after pred finished

## 2021-12-21 ENCOUNTER — Ambulatory Visit: Payer: Self-pay | Admitting: *Deleted

## 2021-12-21 ENCOUNTER — Ambulatory Visit
Admission: EM | Admit: 2021-12-21 | Discharge: 2021-12-21 | Disposition: A | Discharge status: Home / Self Care | Payer: Medicare PPO | Attending: Urgent Care | Admitting: Urgent Care

## 2021-12-21 DIAGNOSIS — L259 Unspecified contact dermatitis, unspecified cause: Secondary | ICD-10-CM

## 2021-12-21 DIAGNOSIS — W57XXXA Bitten or stung by nonvenomous insect and other nonvenomous arthropods, initial encounter: Secondary | ICD-10-CM

## 2021-12-21 MED ORDER — HYDROCORTISONE 1 % EX CREA: TOPICAL_CREAM | CUTANEOUS | 0 refills | Status: DC

## 2021-12-21 NOTE — Telephone Encounter (Signed)
  Chief Complaint: Insect bite Symptoms: Noted bite left hand, little finger after cleaning closet. Noted bite, 2 puncture wounds, "Pretty sure it was a brown recluse. States did not see an insect "But I looked it up and that is where they hide." States "I know you are to seek treatment immediately."  Reports red ring around bite. Frequency: This afternoon Pertinent Negatives: Patient denies Swelling, pain,  itching. Disposition: '[]'$ ED /'[x]'$ Urgent Care (no appt availability in office) / '[]'$ Appointment(In office/virtual)/ '[]'$  [facility]/ '[]'$ Home Care/ '[]'$ Refused Recommended Disposition /'[]'$ [facility]/ '[]'$  Follow-up with PCP Additional Notes: Pt initially calling to inquire how crowded the EDs were. States she does not want to wait around sick people. Advised UC as pt adamant about seeking immediate treatment. No care advise provided.  Reason for Disposition  All other insect bites  Answer Assessment - Initial Assessment Questions 1. TYPE of INSECT: "What type of insect was it?"      "Pretty sure spider bite" 2. ONSET: "When did you get bitten?"      This AM 3. LOCATION: "Where is the insect bite located?"      Little finger of left hand 4. REDNESS: "Is the area red or pink?" If Yes, ask: "What size is area of redness?" (inches or cm). "When did the redness start?"     Red circle around the bite 5. PAIN: "Is there any pain?" If Yes, ask: "How bad is it?"  (Scale 1-10; or mild, moderate, severe)     No 6. ITCHING: "Does it itch?" If Yes, ask: "How bad is the itch?"    - MILD: doesn't interfere with normal activities   - MODERATE-SEVERE: interferes with work, school, sleep, or other activities      no 7. SWELLING: "How big is the swelling?" (inches, cm, or compare to coins)     no 8. OTHER SYMPTOMS: "Do you have any other symptoms?"  (e.g., difficulty breathing, hives)     No  Protocols used: Insect Bite-A-AH

## 2021-12-21 NOTE — ED Provider Notes (Signed)
[place]   MRN: [id number] DOB: Mar 21, 1946  Subjective:   Kim Mclaughlin is a 76 y.o. female presenting for possible insect bite to the left pinky finger.  Patient noticed that she had a spot over her pinky.  She is concerned that it was a bite from a brown recluse.  No tenderness, itching, bleeding, nausea, vomiting.  No current facility-administered medications for this encounter.  Current Outpatient Medications:    ALPRAZolam (XANAX) 0.25 MG tablet, Take 1 tablet (0.25 mg total) by mouth 2 (two) times daily as needed for anxiety., Disp: 20 tablet, Rfl: 0   atorvastatin (LIPITOR) 80 MG tablet, Take 1 tablet (80 mg total) by mouth daily., Disp: 90 tablet, Rfl: 1   Boswellia-Glucosamine-Vit D (OSTEO BI-FLEX ONE PER DAY PO), Take 1 tablet by mouth 2 (two) times daily., Disp: , Rfl:    cetirizine (ZYRTEC) 10 MG tablet, Take 10 mg by mouth daily as needed for allergies., Disp: , Rfl:    Cholecalciferol (VITAMIN D-3) 25 MCG (1000 UT) CAPS, Take 1 capsule by mouth 2 (two) times daily., Disp: , Rfl:    escitalopram (LEXAPRO) 5 MG tablet, Take 1 tablet (5 mg total) by mouth daily., Disp: 90 tablet, Rfl: 1   Glucosamine HCl-MSM (MSM GLUCOSAMINE PO), Take 2 tablets by mouth daily., Disp: , Rfl:    meloxicam (MOBIC) 7.5 MG tablet, Take 1 tablet (7.5 mg total) by mouth daily., Disp: 30 tablet, Rfl: 0   Multiple Vitamins-Minerals (MULTIVITAMIN ADULTS 50+) TABS, Take 1 tablet by mouth daily., Disp: , Rfl:    NONFORMULARY OR COMPOUNDED ITEM, compression socks 20-30 mm/ hg, Disp: 1 each, Rfl: 0   predniSONE (DELTASONE) 10 MG tablet, TAKE 3 TABLETS PO QD FOR 3 DAYS THEN TAKE 2 TABLETS PO QD FOR 3 DAYS THEN TAKE 1 TABLET PO QD FOR 3 DAYS THEN TAKE 1/2 TAB PO QD FOR 3 DAYS, Disp: 20 tablet, Rfl: 0   tiZANidine (ZANAFLEX) 4 MG tablet, Take 1 tablet (4 mg total) by mouth every 6 (six) hours as needed for muscle spasms., Disp: 30 tablet, Rfl: 0   triamcinolone (NASACORT) 55  MCG/ACT AERO nasal inhaler, Place 2 sprays into the nose daily as needed (allergies). , Disp: , Rfl:    triamcinolone cream (KENALOG) 0.1 %, Apply 1 application topically 2 (two) times daily as needed (eczema.). , Disp: , Rfl:    Ubiquinol 100 MG CAPS, Take 1 capsule by mouth 2 (two) times daily., Disp: , Rfl:    Allergies  Allergen Reactions   Macrobid  [Nitrofurantoin Macrocrystal] Other (See Comments)   Doxycycline Hives   Pravastatin Sodium     REACTION: shin pain   Amoxicillin-Pot Clavulanate     GI symptoms; abdominal pain & nausea NOTE: able to take Ampicillin   Augmentin [Amoxicillin-Pot Clavulanate] Nausea Only    Upsets stomach   Codeine Nausea Only   Methylphenidate Hcl Palpitations   Methylprednisolone Rash    Rash and redness on face   Sulfamethoxazole-Trimethoprim Nausea Only     nausea    Past Medical History:  Diagnosis Date   Allergy    Arthritis 07/2014   Right hand, prescribed prednisone-no longer taking. also Rt knww   Brain aneurysm    DVT (deep venous thrombosis) ([place]) 2015   very small; treated with compression and heat, no other problems since   GERD (gastroesophageal reflux disease)    patient denies states she does have hx of peptic ulcer many years  Hepatitis    in 10 th grade  (not sure type- not hep C) - treated  no other problems   Hyperlipidemia    diet controlled   Memory loss    MVP (mitral valve prolapse)    no problems, MD told patient she could not hear it   OSA (obstructive sleep apnea) 10/08/2021   Osteoarthritis    Osteopenia    [facility]   PONV (postoperative nausea and vomiting)    with ear surgery only - No other problems with other surgeries   Precancerous lesion 12/2014   Treated with cream only   TGA (transient global amnesia)    Varicose veins    Vitamin D deficiency      Past Surgical History:  Procedure Laterality Date   APPENDECTOMY     BUNIONECTOMY WITH HAMMERTOE RECONSTRUCTION Left 04/22/2016   Procedure: MODIFIED  MCBRIDE BUNIONECTOMY,  LEFT SECOND HAMMERTOE CORRECTION;  Surgeon: Wylene Simmer, MD;  Location: [facility];  Service: Orthopedics;  Laterality: Left;   COLONOSCOPY     07/2012 Olevia Perches - [facility]/father - polyps   colonoscopy with polypectomy  07/2012    hyperplastic;Dr Brodie   DILATION AND CURETTAGE OF UTERUS     DILATION AND CURETTAGE OF UTERUS N/A 09/03/2014   Procedure: DILATATION AND CURETTAGE;  Surgeon: Harle Battiest, MD;  [facility];  Service: Gynecology;  Laterality: N/A;   ENDOVENOUS ABLATION SAPHENOUS VEIN W/ LASER Left 01-31-2014   EVLA  left greater saphenous vein  by Curt Jews MD   ENDOVENOUS ABLATION SAPHENOUS VEIN W/ LASER Right 03-14-2014   endovenous laser ablation right greater saphenous vein  by Curt Jews MD   FOOT ARTHRODESIS Left 04/22/2016   Procedure: LEFT FIRST TARSAL METATARSAL ARTHRODESIS;  Surgeon: Wylene Simmer, MD;  Location: [place] [facility];  Service: Orthopedics;  Laterality: Left;   G 3 P 3     HYSTEROSCOPY WITH D & C N/A 03/25/2016   Procedure: DILATATION AND CURETTAGE /HYSTEROSCOPY;  Surgeon: Jerelyn Charles, MD;  Location: [facility];  Service: Gynecology;  Laterality: N/A;  WITH ULTRASOUND GUIDANCE   left ear surgery     STAPEDECTOMY     TARSAL METATARSAL FUSION WITH WEIL OSTEOTOMY Left 04/22/2016   Procedure: 2-4 TARSAL METATARSAL FUSION WITH WEIL OSTEOTOMY;  Surgeon: Wylene Simmer, MD;  Location: [place];  Service: Orthopedics;  Laterality: Left;   TUBAL LIGATION      Family History  Problem Relation Age of Onset   Pancreatic cancer Mother    Miscarriages / [place] Mother    Thyroid disease Mother        partial thyroidectomy ; S/P RAI   Heart attack Father        in early 28s   Colon cancer Father    Seizures Sister    Ovarian cancer Maternal Aunt    Breast cancer Paternal Aunt    Aneurysm Paternal Uncle        brain   Colon cancer Paternal Grandmother    Stroke Paternal Grandmother         > 30   Heart attack Paternal Grandfather        in 56s   Cancer Other        among paternal sibs (colon, breast, bone)   Diabetes Neg Hx    Stomach cancer Neg Hx    Ulcerative colitis Neg Hx    Esophageal cancer Neg Hx    Rectal cancer Neg Hx  Social History   Tobacco Use   Smoking status: Former    Types: Cigarettes    Quit date: 04/27/1963    Years since quitting: 58.6   Smokeless tobacco: Never   Tobacco comments:    Smoked < 6 mos;< 1 pack total;quit 1965  Vaping Use   Vaping Use: Never used  Substance Use Topics   Alcohol use: No   Drug use: No    ROS   Objective:   Vitals: BP (!) 152/76 (BP Location: Right Arm)   Pulse 75   Temp 98 F (36.7 C)   Resp 16   SpO2 96%   Physical Exam Constitutional:      General: She is not in acute distress.    Appearance: Normal appearance. She is well-developed. She is not ill-appearing, toxic-appearing or diaphoretic.  HENT:     Head: Normocephalic and atraumatic.     Nose: Nose normal.     Mouth/Throat:     Mouth: Mucous membranes are moist.  Eyes:     General: No scleral icterus.       Right eye: No discharge.        Left eye: No discharge.     Extraocular Movements: Extraocular movements intact.  Cardiovascular:     Rate and Rhythm: Normal rate.  Pulmonary:     Effort: Pulmonary effort is normal.  Skin:    General: Skin is warm and dry.     Findings: Bruising (slight ecchymosis over the proximal dorsal aspect of the left fifth finger; no tenderness, drainage, bleeding, swelling) present.  Neurological:     General: No focal deficit present.     Mental Status: She is alert and oriented to person, place, and time.  Psychiatric:        Mood and Affect: Mood normal.        Behavior: Behavior normal.        Assessment and Plan :   PDMP not reviewed this encounter.  1. Contact dermatitis, unspecified contact dermatitis type, unspecified trigger   2. Insect bite, unspecified site, initial encounter      Low suspicion for a severe reaction or infectious process from an insect bite.  Recommended managing conservatively with a steroid cream for contact dermatitis.  Reviewed ER precautions. Counseled patient on potential for adverse effects with medications prescribed/recommended today, ER and return-to-clinic precautions discussed, patient verbalized understanding.    Jaynee Eagles, [place] 12/22/21 [phone]

## 2021-12-21 NOTE — ED Triage Notes (Signed)
Pt. Was moving some bags and noticed an insect bite to her left pink. Pt. Thinks it was a spider bite. No complaints of drainage or pain

## 2021-12-24 DIAGNOSIS — L821 Other seborrheic keratosis: Secondary | ICD-10-CM | POA: Diagnosis not present

## 2021-12-24 DIAGNOSIS — L814 Other melanin hyperpigmentation: Secondary | ICD-10-CM | POA: Diagnosis not present

## 2021-12-24 DIAGNOSIS — D692 Other nonthrombocytopenic purpura: Secondary | ICD-10-CM | POA: Diagnosis not present

## 2021-12-24 DIAGNOSIS — B353 Tinea pedis: Secondary | ICD-10-CM | POA: Diagnosis not present

## 2021-12-30 ENCOUNTER — Ambulatory Visit (INDEPENDENT_AMBULATORY_CARE_PROVIDER_SITE_OTHER): Payer: Medicare PPO | Admitting: Neurology

## 2021-12-30 ENCOUNTER — Encounter: Payer: Self-pay | Admitting: Neurology

## 2021-12-30 VITALS — BP 165/83 | HR 68 | Ht 63.6 in | Wt 136.6 lb

## 2021-12-30 DIAGNOSIS — R413 Other amnesia: Secondary | ICD-10-CM | POA: Diagnosis not present

## 2021-12-30 DIAGNOSIS — G454 Transient global amnesia: Secondary | ICD-10-CM | POA: Diagnosis not present

## 2021-12-30 DIAGNOSIS — E785 Hyperlipidemia, unspecified: Secondary | ICD-10-CM | POA: Diagnosis not present

## 2021-12-30 MED ORDER — ASPIRIN 81 MG PO TBEC: 81.0000 mg | DELAYED_RELEASE_TABLET | Freq: Every day | ORAL | 12 refills | Status: AC

## 2021-12-30 MED ORDER — ATORVASTATIN CALCIUM 80 MG PO TABS: 40.0000 mg | ORAL_TABLET | Freq: Every day | ORAL | 1 refills | Status: DC

## 2021-12-30 NOTE — Progress Notes (Signed)
[name] [address] [place]. [place] [address] [phone]       OFFICE FOLLOW UP VISIT NOTE  Ms. AIREN DALES Date of Birth:  1946/04/07 Medical Record Number:  [id number]   Referring MD: [name]  Reason for Referral: Memory loss HPI: Initial visit 07/02/2020 :Ms. Addis is a pleasant 76 Caucasian lady seen today for initial office consultation visit for episode of memory loss.  History is obtained from the patient and her husband as well as review of electronic medical records and I personally reviewed pertinent imaging films in PACS.  She has past medical history for hyperlipidemia, gastroesophageal reflux disease and DVT.  She presented on 05/01/2020 with an episode of sudden onset of confusion and short-term memory difficulties.  She woke up that day from his usual state of health and drove to her appointment.  After that she apparently went [place] to run some errands but around 2 PM she got a phone call from her sister who noted that the patient appeared to be confused she could not remember that her older sister was sick.  She also said that she was wandering around and could not find her car.  Patient's husband subsequently came there and noticed that the patient had poor short-term memory and kept on asking the same questions over again.  She did report a mild headache.  She was taken to hospital with she still does not remember the details of the work-up.  CT scan was unremarkable and CT angiogram showed 2 to 3 mm dilatation of supraclinoid ICAs bilaterally possible infundibulum was a small aneurysm.  MRI scan was negative for any acute abnormalities.  EEG was obtained which was normal without any seizure activity.  Patient's condition improved after she was hospitalized in she started remembering new information since hospitalization but still could not remember for 2 hours.  That afternoon.  Patient states she has done well since  then.  She said no problems making new memories after discharge but still has very patchy memory of that afternoon to our.  She has no prior history of seizures, significant head injury, loss of consciousness, strokes, TIA.  She denies being under significant stress for this.  She did see Dr. Marcello Moores neurosurgeon as an outpatient who recommended conservative follow-up for her 2 to 3 mm supraclinoid Immuzim versus infundibulum.  I do not have those records for my review today. Update 10/22/2020 : She returns for follow-up after last visit 3 months ago.  She is accompanied by her husband.  She states she is doing well and has not had any further episodes of memory loss or confusion.  She had EEG done on 07/31/2020 which was normal.  She remains on aspirin is tolerating well without bruising or bleeding and Lipitor which she is tolerating without muscle aches and pains.  Primary care physician recently added co-Q10 to reduce side effects.  Patient is fully independent in activities of daily living.  She has had no new health concerns.  She has no neurological complaints. Update 12/30/2021 ; she returns for follow-up today after last visit with me in June 2022.  She is accompanied by her husband.  Another recurrent episode of transient global amnesia in 76.  09/30/2021 he had a hairdressers appointment where she was noted to be confused and disoriented and had no memory of how she got there.  She had spoken to her sister but did not remember that.  Her husband states that  she has forgotten some of the things that she has done over the past couple of days as well.  Her husband received a call from the hairdresser saying that she was confused, and when he went and found her, she did not remember multiple events from this morning.  She has since been repeating the same questions over and over again, he estimates 15-20 times that she has asked him why she is in the hospital.  Dr. Leonel Ramsay introduced herself to the patient and  spoke to her briefly stepped out of the room to answer the page return patient has no memory of having met him.  CT scan of the head was obtained which showed no acute abnormality.  EEG was normal without epileptiform activity.  Lab work obtained included comprehensive metabolic panel, CBC, urinalysis, vitamin B12 and TSH were all normal.  LDL cholesterol was optimal at 43 mg percent.  Patient was discharged and advised to follow-up with me.  She states she still cannot remember any events from that morning a couple of hours.  The last thing she remembers is going to hairdresser but cannot recall what happened the or how she went to the hospital.  She remembers later event of her hospitalization still has no memory for about 1 hour time this episode.  This episode is quite similar to the one she had an January 2022 but the current episode was longer than the previous one.  Patient denies any history of migraine headaches but does admit to having significant gas in her life due to marital discord.  Patient does of Lipitor was increased to 80 mg by primary care physician but the question is questioning if she needs such a high dose .she has no prior history of progressive memory or cognitive difficulties.  Independent in all activities of daily living.  She denies any headaches other neurological complaints.  There is no family history of Alzheimer's, memory loss. ROS:   14 system review of systems is positive for memory loss, confusion, disorientation and all other systems negative  PMH:  Past Medical History:  Diagnosis Date   Allergy    Arthritis 07/2014   Right hand, prescribed prednisone-no longer taking. also Rt knww   Brain aneurysm    DVT (deep venous thrombosis) ([place]) 2015   very small; treated with compression and heat, no other problems since   GERD (gastroesophageal reflux disease)    patient denies states she does have hx of peptic ulcer many years   Hepatitis    in 10 th grade  (not sure  type- not hep C) - treated  no other problems   Hyperlipidemia    diet controlled   Memory loss    MVP (mitral valve prolapse)    no problems, MD told patient she could not hear it   OSA (obstructive sleep apnea) 10/08/2021   Osteoarthritis    Osteopenia    [facility]   PONV (postoperative nausea and vomiting)    with ear surgery only - No other problems with other surgeries   Precancerous lesion 12/2014   Treated with cream only   TGA (transient global amnesia)    Varicose veins    Vitamin D deficiency     Social History:  Social History   Socioeconomic History   Marital status: Married    Spouse name: [name]   Number of children: Not on file   Years of education: Not on file   Highest education level: Not on file  Occupational  History   Occupation: retired  Tobacco Use   Smoking status: Former    Types: Cigarettes    Quit date: 04/27/1963    Years since quitting: 58.7   Smokeless tobacco: Never   Tobacco comments:    Smoked < 6 mos;< 1 pack total;quit 1965  Vaping Use   Vaping Use: Never used  Substance and Sexual Activity   Alcohol use: No   Drug use: No   Sexual activity: Yes    Birth control/protection: Post-menopausal  Other Topics Concern   Not on file  Social History Narrative   Lives with husband   Right Handed   Drinks average 2-3 cups caffeine daily   Social Determinants of Health   Financial Resource Strain: Low Risk  (02/25/2021)   Overall Financial Resource Strain (CARDIA)    Difficulty of Paying Living Expenses: Not hard at all  Food Insecurity: No Food Insecurity (02/25/2021)   Hunger Vital Sign    Worried About Running Out of Food in the Last Year: Never true    Ran Out of Food in the Last Year: Never true  Transportation Needs: No Transportation Needs (02/25/2021)   PRAPARE - Hydrologist (Medical): No    Lack of Transportation (Non-Medical): No  Physical Activity: Inactive (02/25/2021)   Exercise Vital Sign    Days  of Exercise per Week: 0 days    Minutes of Exercise per Session: 0 min  Stress: No Stress Concern Present (02/25/2021)   [place]    Feeling of Stress : Not at all  Social Connections: Moderately Integrated (02/25/2021)   Social Connection and Isolation Panel [NHANES]    Frequency of Communication with Friends and Family: More than three times a week    Frequency of Social Gatherings with Friends and Family: More than three times a week    Attends Religious Services: More than 4 times per year    Active Member of [employer] or Organizations: No    Attends Archivist Meetings: Never    Marital Status: Married  Human resources officer Violence: Not At Risk (02/25/2021)   Humiliation, Afraid, Rape, and Kick questionnaire    Fear of Current or Ex-Partner: No    Emotionally Abused: No    Physically Abused: No    Sexually Abused: No    Medications:   Current Outpatient Medications on File Prior to Visit  Medication Sig Dispense Refill   ALPRAZolam (XANAX) 0.25 MG tablet Take 1 tablet (0.25 mg total) by mouth 2 (two) times daily as needed for anxiety. 20 tablet 0   Boswellia-Glucosamine-Vit D (OSTEO BI-FLEX ONE PER DAY PO) Take 1 tablet by mouth 2 (two) times daily.     cetirizine (ZYRTEC) 10 MG tablet Take 10 mg by mouth daily as needed for allergies.     Cholecalciferol (VITAMIN D-3) 25 MCG (1000 UT) CAPS Take 1 capsule by mouth 2 (two) times daily.     escitalopram (LEXAPRO) 5 MG tablet Take 1 tablet (5 mg total) by mouth daily. 90 tablet 1   Glucosamine HCl-MSM (MSM GLUCOSAMINE PO) Take 2 tablets by mouth daily.     hydrocortisone cream 1 % Apply to affected area 2 times daily 15 g 0   MAGNESIUM PO Take 1 capsule by mouth daily.     Multiple Vitamins-Minerals (MULTIVITAMIN ADULTS 50+) TABS Take 1 tablet by mouth daily.     NONFORMULARY OR COMPOUNDED ITEM compression socks 20-30 mm/ hg 1  each 0   Omega-3 Fatty Acids (FISH  OIL PO) Take 1 capsule by mouth daily.     triamcinolone (NASACORT) 55 MCG/ACT AERO nasal inhaler Place 2 sprays into the nose daily as needed (allergies).      triamcinolone cream (KENALOG) 0.1 % Apply 1 application topically 2 (two) times daily as needed (eczema.).      Ubiquinol 100 MG CAPS Take 1 capsule by mouth 2 (two) times daily.     UNABLE TO FIND Take 1 tablet by mouth 3 (three) times daily. Med Name: bone strength- plant based calcium     No current facility-administered medications on file prior to visit.    Allergies:   Allergies  Allergen Reactions   Macrobid  [Nitrofurantoin Macrocrystal] Other (See Comments)   Doxycycline Hives   Pravastatin Sodium     REACTION: shin pain   Amoxicillin-Pot Clavulanate     GI symptoms; abdominal pain & nausea NOTE: able to take Ampicillin   Augmentin [Amoxicillin-Pot Clavulanate] Nausea Only    Upsets stomach   Codeine Nausea Only   Methylphenidate Hcl Palpitations   Methylprednisolone Rash    Rash and redness on face   Sulfamethoxazole-Trimethoprim Nausea Only     nausea    Physical Exam General: Frail elderly Caucasian lady seated, in no evident distress Head: head normocephalic and atraumatic.   Neck: supple with no carotid or supraclavicular bruits Cardiovascular: regular rate and rhythm, no murmurs Musculoskeletal: no deformity Skin:  no rash/petichiae Vascular:  Normal pulses all extremities  Neurologic Exam Mental Status: Awake and fully alert. Oriented to place and time. Recent and remote memory intact. Attention span, concentration and fund of knowledge appropriate. Mood and affect appropriate.  Recall 3/3.  Able to name 15 animals that can walk on 4 legs.  Clock drawing 4/4.emotionally labile. Anxious. Cranial Nerves: Fundoscopic exam not done. Pupils equal, briskly reactive to light. Extraocular movements full without nystagmus. Visual fields full to confrontation. Hearing intact. Facial sensation intact. Face,  tongue, palate moves normally and symmetrically.  Motor: Normal bulk and tone. Normal strength in all tested extremity muscles. Sensory.: intact to touch , pinprick , position and vibratory sensation.  Coordination: Rapid alternating movements normal in all extremities. Finger-to-nose and heel-to-shin performed accurately bilaterally. Gait and Station: Arises from chair without difficulty. Stance is normal. Gait demonstrates normal stride length and balance . Able to heel, toe and tandem walk with moderate difficulty.  Reflexes: 1+ and symmetric. Toes downgoing.       ASSESSMENT: 76 year old Caucasian lady with transient episode of memory loss and confusion likely due to transient global amnesia in January 2022 with now recurrent episodes again in June 2023.  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     PLAN: I had a long discussion with the patient and her husband regarding her recurrent episode of memory loss which likely represents recurrent transient global amnesia which is rare but has been described in 5 to 10% of cases different studies.  I reassured her that she is not at increased risk for developing progressive memory loss or dementia..  I recommend further evaluation by checking MRI scan of the brain, MR angiogram of the brain and EEG.  He was advised to take aspirin 81 mg daily for stroke prevention and maintain aggressive risk factor modification with blood pressure goal below 140/90 with LDL cholesterol goal below 70 mg percent.  I have recommended she reduce the dose of Lipitor  down to 40 mg daily as her LDL was quite optimally controlled on that dose.  She will return for follow-up in the future in 6 months or call earlier if needed.Greater than 50% time during this 45-minute  visit was spent on counseling and coordination of care about her episode of memory loss and asymptomatic aneurysm versus  infundibulum discussion and answering questions. Antony Contras, MD Note: This document was prepared with digital dictation and possible smart phrase technology. Any transcriptional errors that result from this process are unintentional.

## 2021-12-30 NOTE — Patient Instructions (Addendum)
I had a long discussion with the patient and her husband regarding her recurrent episode of memory loss which likely represents recurrent transient global amnesia which is rare but has been described.  I reassured her that she is not at increased risk for developing progressive memory loss or dementia..  I recommend further evaluation by checking MRI scan of the brain, MR angiogram of the brain and EEG.  He was advised to take aspirin 81 mg daily for stroke prevention and maintain aggressive risk factor modification with blood pressure goal below 140/90 with LDL cholesterol goal below 70 mg percent.  I have recommended she reduce the dose of Lipitor down to 40 mg daily as her LDL was quite optimally controlled on that dose.  She will return for follow-up in the future in 6 months or call earlier if needed.  Transient Global Amnesia Transient global amnesia causes a sudden and temporary (transient) loss of memory (amnesia). You may remember memories from your distant past and people you know well. However, you may not remember things that happened more recently in the past days, months, or even year. A transient global amnesia episode does not last longer than 24 hours. Transient global amnesia does not affect your other brain functions. Your memory usually returns to normal after an episode is over. One episode of transient global amnesia does not make you more likely to have a stroke, a relapse, or other complications. What are the causes? The cause of this condition is not known. Certain activities have been reported to trigger transient global amnesia. These activities include: Extreme temperatures or high altitude environments. Sexual intercourse. Short-term (acute) illness. Emotional distress, such as receiving bad news or having a lot of stress at once. Strenuous exercise or activity. What increases the risk? You are more likely to develop this condition if: You are 14-36 years old. You have a  history of migraine headaches. What are the signs or symptoms? The main symptoms of this condition include: Being unable to remember recent events. Asking repetitive questions about a situation and surroundings and not recalling the answers to these questions. Other symptoms include: Restlessness and nervousness. Confusion. Headaches. Blurry vision. Dizziness. Nausea or vomiting. How is this diagnosed? This condition may be diagnosed based on: Your symptoms. A physical exam. A test to check your mental abilities (cognitive evaluation). Blood or urine tests. Imaging studies to check brain function. These may include: Electroencephalogram (EEG). This test checks the brain's electrical activity. CT scan. MRI. How is this treated? There is no treatment for this condition. An episode typically goes away on its own after a few hours. You may receive medicines to treat other conditions, such as a migraine. Follow these instructions at home: Take over-the-counter and prescription medicines only as told by your health care provider. Avoid taking medicines that can affect thinking, such as pain or sleeping medicines. Learn what activities may trigger an episode. Avoid these activities as told by your health care provider. Find ways to manage stress, such as meditation or yoga. Keep all follow-up visits. Contact a health care provider if: You have a migraine that does not go away. You experience transient global amnesia repeatedly. Get help right away if: You have a seizure observed by someone else. These symptoms may be an emergency. Get help right away. Call 911. Do not wait to see if the symptoms will go away. Do not drive yourself to the hospital. Summary Transient global amnesia causes a sudden and temporary (transient) loss of memory (  amnesia). Transient global amnesia does not affect your other brain functions. Your memory usually returns to normal after an episode is over. There  is no treatment for this condition. An episode typically goes away on its own after a few hours. You may receive medicines to treat other conditions, such as a migraine. This information is not intended to replace advice given to you by your health care provider. Make sure you discuss any questions you have with your health care provider. Document Revised: 06/01/2021 Document Reviewed: 06/01/2021 Elsevier Patient Education  [employer].

## 2021-12-31 ENCOUNTER — Encounter: Payer: Self-pay | Admitting: Family Medicine

## 2022-01-01 ENCOUNTER — Telehealth: Payer: Self-pay | Admitting: Neurology

## 2022-01-01 ENCOUNTER — Other Ambulatory Visit: Payer: Self-pay | Admitting: Family Medicine

## 2022-01-01 DIAGNOSIS — E785 Hyperlipidemia, unspecified: Secondary | ICD-10-CM

## 2022-01-01 MED ORDER — ATORVASTATIN CALCIUM 40 MG PO TABS: 40.0000 mg | ORAL_TABLET | Freq: Every day | ORAL | 3 refills | Status: DC

## 2022-01-01 NOTE — Telephone Encounter (Signed)
Mcarthur Rossetti Josem Kaufmann: [phone] exp. 01/01/22-01/31/22 sent to GI

## 2022-01-04 ENCOUNTER — Ambulatory Visit (INDEPENDENT_AMBULATORY_CARE_PROVIDER_SITE_OTHER): Payer: Medicare PPO | Admitting: Neurology

## 2022-01-04 DIAGNOSIS — R413 Other amnesia: Secondary | ICD-10-CM

## 2022-01-04 DIAGNOSIS — R4182 Altered mental status, unspecified: Secondary | ICD-10-CM | POA: Diagnosis not present

## 2022-01-04 DIAGNOSIS — G454 Transient global amnesia: Secondary | ICD-10-CM

## 2022-01-07 DIAGNOSIS — H2513 Age-related nuclear cataract, bilateral: Secondary | ICD-10-CM | POA: Diagnosis not present

## 2022-01-07 DIAGNOSIS — H3562 Retinal hemorrhage, left eye: Secondary | ICD-10-CM | POA: Diagnosis not present

## 2022-01-15 ENCOUNTER — Ambulatory Visit
Admission: RE | Admit: 2022-01-15 | Discharge: 2022-01-15 | Disposition: A | Discharge status: Home / Self Care | Payer: Medicare PPO | Source: Ambulatory Visit | Attending: Neurology | Admitting: Neurology

## 2022-01-15 DIAGNOSIS — G454 Transient global amnesia: Secondary | ICD-10-CM | POA: Diagnosis not present

## 2022-01-15 DIAGNOSIS — R413 Other amnesia: Secondary | ICD-10-CM | POA: Diagnosis not present

## 2022-01-16 NOTE — Progress Notes (Signed)
Kindly inform the patient that her EEG or brainwave study was normal

## 2022-01-18 NOTE — Progress Notes (Signed)
Kindly inform the patient that MRI scan of the brain showed no abnormalities. MR angiogram study of the brain blood vessels showed minor narrowing of one of the blood vessels in the back on the left but nothing to worry about.  No major blockages or aneurysms.

## 2022-02-08 DIAGNOSIS — M1711 Unilateral primary osteoarthritis, right knee: Secondary | ICD-10-CM | POA: Diagnosis not present

## 2022-02-09 ENCOUNTER — Other Ambulatory Visit: Payer: Self-pay | Admitting: *Deleted

## 2022-02-09 DIAGNOSIS — I872 Venous insufficiency (chronic) (peripheral): Secondary | ICD-10-CM

## 2022-02-18 NOTE — Progress Notes (Signed)
Office Note     CC: Right lower extremity varicose veins Requesting Provider:  Carollee Herter, Alferd Apa, *  HPI: Kim Mclaughlin is a 76 y.o. (1946/04/16) female who presents at the request of Ann Held, DO for evaluation of right lower extremity varicose veins.  Patient is well-known to our office having undergone bilateral greater saphenous vein ablation in October and November 2015.  Since that time, she has been doing well.  She continues to wear compressions on a daily basis.  She presents today with a cluster of varicose veins appreciated at the medial aspect of the popliteal fossa.  She denies bleeding, ulceration.  The area is relatively asymptomatic.  She notes no increased heaviness in the right lower extremity as compared to the left.  She denies symptoms of claudication, ischemic rest pain, tissue loss.   Past Medical History:  Diagnosis Date   Allergy    Arthritis 07/2014   Right hand, prescribed prednisone-no longer taking. also Rt knww   Brain aneurysm    DVT (deep venous thrombosis) ([place]) 2015   very small; treated with compression and heat, no other problems since   GERD (gastroesophageal reflux disease)    patient denies states she does have hx of peptic ulcer many years   Hepatitis    in 10 th grade  (not sure type- not hep C) - treated  no other problems   Hyperlipidemia    diet controlled   Memory loss    MVP (mitral valve prolapse)    no problems, MD told patient she could not hear it   OSA (obstructive sleep apnea) 10/08/2021   Osteoarthritis    Osteopenia    [facility]   PONV (postoperative nausea and vomiting)    with ear surgery only - No other problems with other surgeries   Precancerous lesion 12/2014   Treated with cream only   TGA (transient global amnesia)    Varicose veins    Vitamin D deficiency     Past Surgical History:  Procedure Laterality Date   APPENDECTOMY     BUNIONECTOMY WITH HAMMERTOE RECONSTRUCTION Left 04/22/2016    Procedure: MODIFIED [facility] BUNIONECTOMY,  LEFT SECOND HAMMERTOE CORRECTION;  Surgeon: Wylene Simmer, MD;  Location: [place];  Service: Orthopedics;  Laterality: Left;   COLONOSCOPY     07/2012 Olevia Perches - [facility]/father - polyps   colonoscopy with polypectomy  07/2012    hyperplastic;Dr Brodie   DILATION AND CURETTAGE OF UTERUS     DILATION AND CURETTAGE OF UTERUS N/A 09/03/2014   Procedure: DILATATION AND CURETTAGE;  Surgeon: Harle Battiest, MD;  Location: [facility];  Service: Gynecology;  Laterality: N/A;   ENDOVENOUS ABLATION SAPHENOUS VEIN W/ LASER Left 01-31-2014   EVLA  left greater saphenous vein  by Curt Jews MD   ENDOVENOUS ABLATION SAPHENOUS VEIN W/ LASER Right 03-14-2014   endovenous laser ablation right greater saphenous vein  by Curt Jews MD   FOOT ARTHRODESIS Left 04/22/2016   Procedure: LEFT FIRST TARSAL METATARSAL ARTHRODESIS;  Surgeon: Wylene Simmer, MD;  Location: [place];  Service: Orthopedics;  Laterality: Left;   G 3 P 3     HYSTEROSCOPY WITH D & C N/A 03/25/2016   Procedure: DILATATION AND CURETTAGE /HYSTEROSCOPY;  Surgeon: Jerelyn Charles, MD;  Location: [facility];  Service: Gynecology;  Laterality: N/A;  WITH ULTRASOUND GUIDANCE   left ear surgery     STAPEDECTOMY     TARSAL METATARSAL FUSION WITH WEIL  OSTEOTOMY Left 04/22/2016   Procedure: 2-4 TARSAL METATARSAL FUSION WITH WEIL OSTEOTOMY;  Surgeon: Wylene Simmer, MD;  Location: [place];  Service: Orthopedics;  Laterality: Left;   TUBAL LIGATION      Social History   Socioeconomic History   Marital status: Married    Spouse name: [name]   Number of children: Not on file   Years of education: Not on file   Highest education level: Not on file  Occupational History   Occupation: retired  Tobacco Use   Smoking status: Former    Types: Cigarettes    Quit date: 04/27/1963    Years since quitting: 58.8   Smokeless tobacco: Never   Tobacco comments:    Smoked < 6  mos;< 1 pack total;quit 1965  Vaping Use   Vaping Use: Never used  Substance and Sexual Activity   Alcohol use: No   Drug use: No   Sexual activity: Yes    Birth control/protection: Post-menopausal  Other Topics Concern   Not on file  Social History Narrative   Lives with husband   Right Handed   Drinks average 2-3 cups caffeine daily   Social Determinants of Health   Financial Resource Strain: Low Risk  (02/25/2021)   Overall Financial Resource Strain (CARDIA)    Difficulty of Paying Living Expenses: Not hard at all  Food Insecurity: No Food Insecurity (02/25/2021)   Hunger Vital Sign    Worried About Running Out of Food in the Last Year: Never true    [place] in the Last Year: Never true  Transportation Needs: No Transportation Needs (02/25/2021)   PRAPARE - Hydrologist (Medical): No    Lack of Transportation (Non-Medical): No  Physical Activity: Inactive (02/25/2021)   Exercise Vital Sign    Days of Exercise per Week: 0 days    Minutes of Exercise per Session: 0 min  Stress: No Stress Concern Present (02/25/2021)   [place]    Feeling of Stress : Not at all  Social Connections: Moderately Integrated (02/25/2021)   Social Connection and Isolation Panel [NHANES]    Frequency of Communication with Friends and Family: More than three times a week    Frequency of Social Gatherings with Friends and Family: More than three times a week    Attends Religious Services: More than 4 times per year    Active Member of [employer] or Organizations: No    Attends Archivist Meetings: Never    Marital Status: Married  Human resources officer Violence: Not At Risk (02/25/2021)   Humiliation, Afraid, Rape, and Kick questionnaire    Fear of Current or Ex-Partner: No    Emotionally Abused: No    Physically Abused: No    Sexually Abused: No   Family History  Problem Relation Age of Onset    Pancreatic cancer Mother    Miscarriages / [place] Mother    Thyroid disease Mother        partial thyroidectomy ; S/P RAI   Heart attack Father        in early 33s   Colon cancer Father    Seizures Sister    Ovarian cancer Maternal Aunt    Breast cancer Paternal Aunt    Aneurysm Paternal Uncle        brain   Colon cancer Paternal Grandmother    Stroke Paternal Grandmother        >  34   Heart attack Paternal Grandfather        in 75s   Cancer Other        among paternal sibs (colon, breast, bone)   Diabetes Neg Hx    Stomach cancer Neg Hx    Ulcerative colitis Neg Hx    Esophageal cancer Neg Hx    Rectal cancer Neg Hx     Current Outpatient Medications  Medication Sig Dispense Refill   ALPRAZolam (XANAX) 0.25 MG tablet Take 1 tablet (0.25 mg total) by mouth 2 (two) times daily as needed for anxiety. 20 tablet 0   aspirin EC 81 MG tablet Take 1 tablet (81 mg total) by mouth daily. Swallow whole. 30 tablet 12   atorvastatin (LIPITOR) 40 MG tablet Take 1 tablet (40 mg total) by mouth daily. 90 tablet 3   Boswellia-Glucosamine-Vit D (OSTEO BI-FLEX ONE PER DAY PO) Take 1 tablet by mouth 2 (two) times daily.     cetirizine (ZYRTEC) 10 MG tablet Take 10 mg by mouth daily as needed for allergies.     Cholecalciferol (VITAMIN D-3) 25 MCG (1000 UT) CAPS Take 1 capsule by mouth 2 (two) times daily.     escitalopram (LEXAPRO) 5 MG tablet Take 1 tablet (5 mg total) by mouth daily. 90 tablet 1   Glucosamine HCl-MSM (MSM GLUCOSAMINE PO) Take 2 tablets by mouth daily.     hydrocortisone cream 1 % Apply to affected area 2 times daily 15 g 0   MAGNESIUM PO Take 1 capsule by mouth daily.     Multiple Vitamins-Minerals (MULTIVITAMIN ADULTS 50+) TABS Take 1 tablet by mouth daily.     NONFORMULARY OR COMPOUNDED ITEM compression socks 20-30 mm/ hg 1 each 0   Omega-3 Fatty Acids (FISH OIL PO) Take 1 capsule by mouth daily.     triamcinolone (NASACORT) 55 MCG/ACT AERO nasal inhaler Place 2 sprays  into the nose daily as needed (allergies).      triamcinolone cream (KENALOG) 0.1 % Apply 1 application topically 2 (two) times daily as needed (eczema.).      Ubiquinol 100 MG CAPS Take 1 capsule by mouth 2 (two) times daily.     UNABLE TO FIND Take 1 tablet by mouth 3 (three) times daily. Med Name: bone strength- plant based calcium     No current facility-administered medications for this visit.    Allergies  Allergen Reactions   Macrobid  [Nitrofurantoin Macrocrystal] Other (See Comments)   Doxycycline Hives   Pravastatin Sodium     REACTION: shin pain   Amoxicillin-Pot Clavulanate     GI symptoms; abdominal pain & nausea NOTE: able to take Ampicillin   Augmentin [Amoxicillin-Pot Clavulanate] Nausea Only    Upsets stomach   Codeine Nausea Only   Methylphenidate Hcl Palpitations   Methylprednisolone Rash    Rash and redness on face   Sulfamethoxazole-Trimethoprim Nausea Only     nausea     REVIEW OF SYSTEMS:  '[X]'$  denotes positive finding, '[ ]'$  denotes negative finding Cardiac  Comments:  Chest pain or chest pressure:    Shortness of breath upon exertion:    Short of breath when lying flat:    Irregular heart rhythm:        Vascular    Pain in calf, thigh, or hip brought on by ambulation:    Pain in feet at night that wakes you up from your sleep:     Blood clot in your veins:    Leg swelling:  Pulmonary    Oxygen at home:    Productive cough:     Wheezing:         Neurologic    Sudden weakness in arms or legs:     Sudden numbness in arms or legs:     Sudden onset of difficulty speaking or slurred speech:    Temporary loss of vision in one eye:     Problems with dizziness:         Gastrointestinal    Blood in stool:     Vomited blood:         Genitourinary    Burning when urinating:     Blood in urine:        Psychiatric    Major depression:         Hematologic    Bleeding problems:    Problems with blood clotting too easily:        Skin     Rashes or ulcers:        Constitutional    Fever or chills:      PHYSICAL EXAMINATION:  There were no vitals filed for this visit.  General:  WDWN in NAD; vital signs documented above Gait: Not observed HENT: WNL, normocephalic Pulmonary: normal non-labored breathing , without Rales, rhonchi,  wheezing Cardiac: regular HR Abdomen: soft, NT, no masses Skin: without rashes Vascular Exam/Pulses:  Right Left  Radial 2+ (normal) 2+ (normal)  Ulnar    Femoral    Popliteal    DP 2+ (normal) 2+ (normal)  PT 2+ (normal) 2+ (normal)   Extremities: without ischemic changes, without Gangrene , without cellulitis; without open wounds;  Musculoskeletal: no muscle wasting or atrophy  Neurologic: A&O X 3;  No focal weakness or paresthesias are detected Psychiatric:  The pt has Normal affect.   Non-Invasive Vascular Imaging:   Venous Reflux Times  +--------------+---------+------+-----------+------------+---------+  RIGHT         Reflux NoRefluxReflux TimeDiameter cmsComments                           Yes                                    +--------------+---------+------+-----------+------------+---------+  CFV           no                                               +--------------+---------+------+-----------+------------+---------+  FV mid                  yes   >1 second                        +--------------+---------+------+-----------+------------+---------+  Popliteal     no                                    PC         +--------------+---------+------+-----------+------------+---------+  GSV at SFJ    no                            0.60               +--------------+---------+------+-----------+------------+---------+  GSV prox thigh                                      NV         +--------------+---------+------+-----------+------------+---------+  GSV mid thigh no                            0.17                +--------------+---------+------+-----------+------------+---------+  GSV dist thighno                            0.18               +--------------+---------+------+-----------+------------+---------+  GSV at knee   no        yes    >500 ms      0.21               +--------------+---------+------+-----------+------------+---------+  GSV prox calf           yes    >500 ms      0.14               +--------------+---------+------+-----------+------------+---------+  GSV mid calf  no                            0.11               +--------------+---------+------+-----------+------------+---------+  GSV dist calf                                       too small  +--------------+---------+------+-----------+------------+---------+  SSV Pop Fossa                                       too small  +--------------+---------+------+-----------+------------+---------+  SSV prox calf                                       too small  +--------------+---------+------+-----------+------------+---------+  SSV mid calf  no                            0.15               +--------------+---------+------+-----------+------------+---------+  AASV O        no                            025                +--------------+---------+------+-----------+------------+---------+  AASV P        no                            0.18               +--------------+---------+------+-----------+------------+---------+  AASV M                  yes                                    +--------------+---------+------+-----------+------------+---------+  ASSESSMENT/PLAN:: 76 y.o. female presenting with concern regarding right lower extremity varicose veins clustered at the medial aspect of the popliteal fossa.  On physical exam, the varicose veins are soft, nonpainful.  No ulceration, no bleeding.  Duplex ultrasound was reviewed demonstrating what appears  to be some recanalization of the greater saphenous vein from prior ablation in 2016.  There is no venous reflux except at the level of the knee and proximal calf which is associated with the varicosities.  I had a long discussion with Ellianne and her husband regarding the above.  Being that she is asymptomatic, she would benefit from continued use of compression stockings.  I asked that she come back to my office should these become painful, or bleeding or ulceration occur, as we could discuss stab phlebectomy.  Corra was okay following up as needed.   Broadus John, MD [facility] [phone]

## 2022-02-19 ENCOUNTER — Encounter: Payer: Self-pay | Admitting: Vascular Surgery

## 2022-02-19 ENCOUNTER — Ambulatory Visit (INDEPENDENT_AMBULATORY_CARE_PROVIDER_SITE_OTHER): Payer: Medicare PPO | Admitting: Vascular Surgery

## 2022-02-19 ENCOUNTER — Ambulatory Visit (HOSPITAL_COMMUNITY)
Admission: RE | Admit: 2022-02-19 | Discharge: 2022-02-19 | Disposition: A | Discharge status: Home / Self Care | Payer: Medicare PPO | Source: Ambulatory Visit | Attending: Vascular Surgery | Admitting: Vascular Surgery

## 2022-02-19 VITALS — BP 144/71 | HR 60 | Temp 97.9°F | Resp 20 | Ht 63.0 in | Wt 137.0 lb

## 2022-02-19 DIAGNOSIS — I8391 Asymptomatic varicose veins of right lower extremity: Secondary | ICD-10-CM | POA: Diagnosis not present

## 2022-02-19 DIAGNOSIS — I872 Venous insufficiency (chronic) (peripheral): Secondary | ICD-10-CM | POA: Diagnosis not present

## 2022-02-22 DIAGNOSIS — D485 Neoplasm of uncertain behavior of skin: Secondary | ICD-10-CM | POA: Diagnosis not present

## 2022-02-22 DIAGNOSIS — L814 Other melanin hyperpigmentation: Secondary | ICD-10-CM | POA: Diagnosis not present

## 2022-02-24 ENCOUNTER — Encounter: Payer: Self-pay | Admitting: Family Medicine

## 2022-02-24 ENCOUNTER — Ambulatory Visit (INDEPENDENT_AMBULATORY_CARE_PROVIDER_SITE_OTHER): Payer: Medicare PPO | Admitting: Medical

## 2022-02-24 ENCOUNTER — Ambulatory Visit (HOSPITAL_BASED_OUTPATIENT_CLINIC_OR_DEPARTMENT_OTHER)
Admission: RE | Admit: 2022-02-24 | Discharge: 2022-02-24 | Disposition: A | Discharge status: Home / Self Care | Payer: Medicare PPO | Source: Ambulatory Visit | Attending: Medical | Admitting: Medical

## 2022-02-24 VITALS — BP 140/80 | HR 69 | Temp 98.2°F | Resp 18 | Ht 63.0 in | Wt 137.4 lb

## 2022-02-24 DIAGNOSIS — M5440 Lumbago with sciatica, unspecified side: Secondary | ICD-10-CM

## 2022-02-24 DIAGNOSIS — M25552 Pain in left hip: Secondary | ICD-10-CM | POA: Diagnosis not present

## 2022-02-24 DIAGNOSIS — M5136 Other intervertebral disc degeneration, lumbar region: Secondary | ICD-10-CM | POA: Diagnosis not present

## 2022-02-24 MED ORDER — PREDNISONE 10 MG PO TABS: ORAL_TABLET | ORAL | 0 refills | Status: DC

## 2022-02-24 NOTE — Addendum Note (Signed)
Addended by: Jeronimo Greaves on: 02/24/2022 02:57 PM   Modules accepted: Orders

## 2022-02-24 NOTE — Progress Notes (Signed)
Subjective:    Patient ID: Kim Mclaughlin, female    DOB: 10/28/1945, 76 y.o.   MRN: [id number]  HPI Pt in with report of rt side sciatic area pain in august. And some just recently had pain on Saturday night.   This event started on Saturday. Pain in low back, left sciatic area, left back of leg and occasional  left groin area. Pt tried meloxicam and did not help much.  Pt states pain level is about 7/10  No recent lumbar spine.     Back in august she states prednisone stopped pain quickly.      Review of Systems  Constitutional:  Negative for chills, fatigue and fever.  Respiratory:  Negative for cough, chest tightness, shortness of breath and wheezing.   Cardiovascular:  Negative for chest pain and palpitations.  Gastrointestinal:  Negative for abdominal pain, constipation, nausea and vomiting.  Genitourinary:  Negative for dysuria and flank pain.  Musculoskeletal:  Positive for back pain.    Past Medical History:  Diagnosis Date   Allergy    Arthritis 07/2014   Right hand, prescribed prednisone-no longer taking. also Rt knww   Brain aneurysm    DVT (deep venous thrombosis) ([place]) 2015   very small; treated with compression and heat, no other problems since   GERD (gastroesophageal reflux disease)    patient denies states she does have hx of peptic ulcer many years   Hepatitis    in 10 th grade  (not sure type- not hep C) - treated  no other problems   Hyperlipidemia    diet controlled   Memory loss    MVP (mitral valve prolapse)    no problems, MD told patient she could not hear it   OSA (obstructive sleep apnea) 10/08/2021   Osteoarthritis    Osteopenia    [facility]   PONV (postoperative nausea and vomiting)    with ear surgery only - No other problems with other surgeries   Precancerous lesion 12/2014   Treated with cream only   TGA (transient global amnesia)    Varicose veins    Vitamin D deficiency      Social History   Socioeconomic History    Marital status: Married    Spouse name: [name]   Number of children: Not on file   Years of education: Not on file   Highest education level: Not on file  Occupational History   Occupation: retired  Tobacco Use   Smoking status: Former    Types: Cigarettes    Quit date: 04/27/1963    Years since quitting: 58.8   Smokeless tobacco: Never   Tobacco comments:    Smoked < 6 mos;< 1 pack total;quit 1965  Vaping Use   Vaping Use: Never used  Substance and Sexual Activity   Alcohol use: No   Drug use: No   Sexual activity: Yes    Birth control/protection: Post-menopausal  Other Topics Concern   Not on file  Social History Narrative   Lives with husband   Right Handed   Drinks average 2-3 cups caffeine daily   Social Determinants of Health   Financial Resource Strain: Low Risk  (02/25/2021)   Overall Financial Resource Strain (CARDIA)    Difficulty of Paying Living Expenses: Not hard at all  Food Insecurity: No Food Insecurity (02/25/2021)   Hunger Vital Sign    Worried About Running Out of Food in the Last Year: Never true    Ran Out of Food  in the Last Year: Never true  Transportation Needs: No Transportation Needs (02/25/2021)   PRAPARE - Hydrologist (Medical): No    Lack of Transportation (Non-Medical): No  Physical Activity: Inactive (02/25/2021)   Exercise Vital Sign    Days of Exercise per Week: 0 days    Minutes of Exercise per Session: 0 min  Stress: No Stress Concern Present (02/25/2021)   [place]    Feeling of Stress : Not at all  Social Connections: Moderately Integrated (02/25/2021)   Social Connection and Isolation Panel [NHANES]    Frequency of Communication with Friends and Family: More than three times a week    Frequency of Social Gatherings with Friends and Family: More than three times a week    Attends Religious Services: More than 4 times per year    Active  Member of [employer] or Organizations: No    Attends Archivist Meetings: Never    Marital Status: Married  Human resources officer Violence: Not At Risk (02/25/2021)   Humiliation, Afraid, Rape, and Kick questionnaire    Fear of Current or Ex-Partner: No    Emotionally Abused: No    Physically Abused: No    Sexually Abused: No    Past Surgical History:  Procedure Laterality Date   APPENDECTOMY     BUNIONECTOMY WITH HAMMERTOE RECONSTRUCTION Left 04/22/2016   Procedure: [place],  LEFT SECOND HAMMERTOE CORRECTION;  Surgeon: Wylene Simmer, MD;  Location: [place];  Service: Orthopedics;  Laterality: Left;   COLONOSCOPY     07/2012 Olevia Perches - [facility]/father - polyps   colonoscopy with polypectomy  07/2012    hyperplastic;Dr Brodie   DILATION AND CURETTAGE OF UTERUS     DILATION AND CURETTAGE OF UTERUS N/A 09/03/2014   Procedure: DILATATION AND CURETTAGE;  Surgeon: Harle Battiest, MD;  Location: [place];  Service: Gynecology;  Laterality: N/A;   ENDOVENOUS ABLATION SAPHENOUS VEIN W/ LASER Left 01-31-2014   EVLA  left greater saphenous vein  by Curt Jews MD   ENDOVENOUS ABLATION SAPHENOUS VEIN W/ LASER Right 03-14-2014   endovenous laser ablation right greater saphenous vein  by Curt Jews MD   FOOT ARTHRODESIS Left 04/22/2016   Procedure: LEFT FIRST TARSAL METATARSAL ARTHRODESIS;  Surgeon: Wylene Simmer, MD;  Location: [place];  Service: Orthopedics;  Laterality: Left;   G 3 P 3     HYSTEROSCOPY WITH D & C N/A 03/25/2016   Procedure: DILATATION AND CURETTAGE /HYSTEROSCOPY;  Surgeon: Jerelyn Charles, MD;  Location: [facility];  Service: Gynecology;  Laterality: N/A;  WITH ULTRASOUND GUIDANCE   left ear surgery     STAPEDECTOMY     TARSAL METATARSAL FUSION WITH WEIL OSTEOTOMY Left 04/22/2016   Procedure: 2-4 TARSAL METATARSAL FUSION WITH WEIL OSTEOTOMY;  Surgeon: Wylene Simmer, MD;  Location: [place];  Service:  Orthopedics;  Laterality: Left;   TUBAL LIGATION      Family History  Problem Relation Age of Onset   Pancreatic cancer Mother    Miscarriages / [place] Mother    Thyroid disease Mother        partial thyroidectomy ; S/P RAI   Heart attack Father        in early 56s   Colon cancer Father    Seizures Sister    Ovarian cancer Maternal Aunt    Breast cancer Paternal Aunt    Aneurysm Paternal Uncle  brain   Colon cancer Paternal Grandmother    Stroke Paternal Grandmother        > 60   Heart attack Paternal Grandfather        in 26s   Cancer Other        among paternal sibs (colon, breast, bone)   Diabetes Neg Hx    Stomach cancer Neg Hx    Ulcerative colitis Neg Hx    Esophageal cancer Neg Hx    Rectal cancer Neg Hx     Allergies  Allergen Reactions   Macrobid  [Nitrofurantoin Macrocrystal] Other (See Comments)   Doxycycline Hives   Pravastatin Sodium     REACTION: shin pain   Amoxicillin-Pot Clavulanate     GI symptoms; abdominal pain & nausea NOTE: able to take Ampicillin   Augmentin [Amoxicillin-Pot Clavulanate] Nausea Only    Upsets stomach   Codeine Nausea Only   Methylphenidate Hcl Palpitations   Methylprednisolone Rash    Rash and redness on face   Sulfamethoxazole-Trimethoprim Nausea Only     nausea    Current Outpatient Medications on File Prior to Visit  Medication Sig Dispense Refill   aspirin EC 81 MG tablet Take 1 tablet (81 mg total) by mouth daily. Swallow whole. 30 tablet 12   atorvastatin (LIPITOR) 40 MG tablet Take 1 tablet (40 mg total) by mouth daily. 90 tablet 3   Boswellia-Glucosamine-Vit D (OSTEO BI-FLEX ONE PER DAY PO) Take 1 tablet by mouth 2 (two) times daily.     cetirizine (ZYRTEC) 10 MG tablet Take 10 mg by mouth daily as needed for allergies.     Cholecalciferol (VITAMIN D-3) 25 MCG (1000 UT) CAPS Take 1 capsule by mouth 2 (two) times daily.     escitalopram (LEXAPRO) 5 MG tablet Take 1 tablet (5 mg total) by mouth daily.  (Patient taking differently: Take 5 mg by mouth in the morning and at bedtime.) 90 tablet 1   Glucosamine HCl-MSM (MSM GLUCOSAMINE PO) Take 2 tablets by mouth daily.     hydrocortisone cream 1 % Apply to affected area 2 times daily 15 g 0   MAGNESIUM PO Take 1 capsule by mouth daily.     Multiple Vitamins-Minerals (MULTIVITAMIN ADULTS 50+) TABS Take 1 tablet by mouth daily.     NONFORMULARY OR COMPOUNDED ITEM compression socks 20-30 mm/ hg 1 each 0   Omega-3 Fatty Acids (FISH OIL PO) Take 1 capsule by mouth daily.     triamcinolone (NASACORT) 55 MCG/ACT AERO nasal inhaler Place 2 sprays into the nose daily as needed (allergies).      triamcinolone cream (KENALOG) 0.1 % Apply 1 application topically 2 (two) times daily as needed (eczema.).      Ubiquinol 100 MG CAPS Take 1 capsule by mouth 2 (two) times daily.     UNABLE TO FIND Take 1 tablet by mouth 3 (three) times daily. Med Name: bone strength- plant based calcium     No current facility-administered medications on file prior to visit.    BP (!) 140/80   Pulse 69   Temp 98.2 F (36.8 C)   Resp 18   Ht '5\' 3"'$  (1.6 m)   Wt 137 lb 6.4 oz (62.3 kg)   SpO2 98%   BMI 24.34 kg/m        Objective:   Physical Exam  General Appearance- Not in acute distress.    Chest and Lung Exam Auscultation: Breath sounds:-Normal. Clear even and unlabored. Adventitious sounds:- No Adventitious sounds.  Cardiovascular Auscultation:Rythm - Regular, rate and rythm. Heart Sounds -Normal heart sounds.  Abdomen Inspection:-Inspection Normal.  Palpation/Perucssion: Palpation and Percussion of the abdomen reveal- Non Tender, No Rebound tenderness, No rigidity(Guarding) and No Palpable abdominal masses.  Liver:-Normal.  Spleen:- Normal.   Back Mid land left side lumbar spine tenderness to palpation. Pain on straight leg lift. Pain on lateral movements and flexion/extension of the spine.  Lower ext neurologic  L5-S1 sensation intact  bilaterally. Normal patellar reflexes bilaterally. No foot drop bilaterally.       Assessment & Plan:   Patient Instructions  Back pain lumbar region with sciatica features.  Some left hip pain on exam as well.  We will get lumbar spine x-ray and hip x-ray today.  Prescribed 8-day taper dose of prednisone.  Rx advisement given.  Can use Tylenol as add-on as needed and advised using tinazadine 1 tablet nightly as needed as well.  Back stretching exercises as tolerated.  Start when you are pain level subsides to more reasonable level.  If back pain persisting despite the above measures then refer to sports medicine.  Follow-up in 7 to 10 days for any residual/persistent pain or sooner if needed.

## 2022-02-24 NOTE — Patient Instructions (Addendum)
Back pain lumbar region with sciatica features.  Some left hip pain on exam as well.  We will get lumbar spine x-ray and hip x-ray today.  Prescribed 8-day taper dose of prednisone.  Rx advisement given.  Can use Tylenol as add-on as needed and advised using tinazadine 1 tablet nightly as needed as well.  Back stretching exercises as tolerated.  Start when you are pain level subsides to more reasonable level.  If back pain persisting despite the above measures then refer to sports medicine.  Follow-up in 7 to 10 days for any residual/persistent pain or sooner if needed.   Back Exercises These exercises help to make your trunk and back strong. They also help to keep the lower back flexible. Doing these exercises can help to prevent or lessen pain in your lower back. If you have back pain, try to do these exercises 2-3 times each day or as told by your doctor. As you get better, do the exercises once each day. Repeat the exercises more often as told by your doctor. To stop back pain from coming back, do the exercises once each day, or as told by your doctor. Do exercises exactly as told by your doctor. Stop right away if you feel sudden pain or your pain gets worse. Exercises Single knee to chest Do these steps 3-5 times in a row for each leg: Lie on your back on a firm bed or the floor with your legs stretched out. Bring one knee to your chest. Grab your knee or thigh with both hands and hold it in place. Pull on your knee until you feel a gentle stretch in your lower back or butt. Keep doing the stretch for 10-30 seconds. Slowly let go of your leg and straighten it. Pelvic tilt Do these steps 5-10 times in a row: Lie on your back on a firm bed or the floor with your legs stretched out. Bend your knees so they point up to the ceiling. Your feet should be flat on the floor. Tighten your lower belly (abdomen) muscles to press your lower back against the floor. This will make your tailbone  point up to the ceiling instead of pointing down to your feet or the floor. Stay in this position for 5-10 seconds while you gently tighten your muscles and breathe evenly. Cat-cow Do these steps until your lower back bends more easily: Get on your hands and knees on a firm bed or the floor. Keep your hands under your shoulders, and keep your knees under your hips. You may put padding under your knees. Let your head hang down toward your chest. Tighten (contract) the muscles in your belly. Point your tailbone toward the floor so your lower back becomes rounded like the back of a cat. Stay in this position for 5 seconds. Slowly lift your head. Let the muscles of your belly relax. Point your tailbone up toward the ceiling so your back forms a sagging arch like the back of a cow. Stay in this position for 5 seconds.  Press-ups Do these steps 5-10 times in a row: Lie on your belly (face-down) on a firm bed or the floor. Place your hands near your head, about shoulder-width apart. While you keep your back relaxed and keep your hips on the floor, slowly straighten your arms to raise the top half of your body and lift your shoulders. Do not use your back muscles. You may change where you place your hands to make yourself more comfortable.  Stay in this position for 5 seconds. Keep your back relaxed. Slowly return to lying flat on the floor.  Bridges Do these steps 10 times in a row: Lie on your back on a firm bed or the floor. Bend your knees so they point up to the ceiling. Your feet should be flat on the floor. Your arms should be flat at your sides, next to your body. Tighten your butt muscles and lift your butt off the floor until your waist is almost as high as your knees. If you do not feel the muscles working in your butt and the back of your thighs, slide your feet 1-2 inches (2.5-5 cm) farther away from your butt. Stay in this position for 3-5 seconds. Slowly lower your butt to the floor,  and let your butt muscles relax. If this exercise is too easy, try doing it with your arms crossed over your chest. Belly crunches Do these steps 5-10 times in a row: Lie on your back on a firm bed or the floor with your legs stretched out. Bend your knees so they point up to the ceiling. Your feet should be flat on the floor. Cross your arms over your chest. Tip your chin a little bit toward your chest, but do not bend your neck. Tighten your belly muscles and slowly raise your chest just enough to lift your shoulder blades a tiny bit off the floor. Avoid raising your body higher than that because it can put too much stress on your lower back. Slowly lower your chest and your head to the floor. Back lifts Do these steps 5-10 times in a row: Lie on your belly (face-down) with your arms at your sides, and rest your forehead on the floor. Tighten the muscles in your legs and your butt. Slowly lift your chest off the floor while you keep your hips on the floor. Keep the back of your head in line with the curve in your back. Look at the floor while you do this. Stay in this position for 3-5 seconds. Slowly lower your chest and your face to the floor. Contact a doctor if: Your back pain gets a lot worse when you do an exercise. Your back pain does not get better within 2 hours after you exercise. If you have any of these problems, stop doing the exercises. Do not do them again unless your doctor says it is okay. Get help right away if: You have sudden, very bad back pain. If this happens, stop doing the exercises. Do not do them again unless your doctor says it is okay. This information is not intended to replace advice given to you by your health care provider. Make sure you discuss any questions you have with your health care provider. Document Revised: 06/25/2020 Document Reviewed: 06/25/2020 Elsevier Patient Education  [place].

## 2022-02-24 NOTE — Addendum Note (Signed)
Addended by: Anabel Halon on: 02/24/2022 02:40 PM   Modules accepted: Orders

## 2022-03-04 DIAGNOSIS — M5116 Intervertebral disc disorders with radiculopathy, lumbar region: Secondary | ICD-10-CM | POA: Diagnosis not present

## 2022-03-04 DIAGNOSIS — M9903 Segmental and somatic dysfunction of lumbar region: Secondary | ICD-10-CM | POA: Diagnosis not present

## 2022-03-04 DIAGNOSIS — M9905 Segmental and somatic dysfunction of pelvic region: Secondary | ICD-10-CM | POA: Diagnosis not present

## 2022-03-04 DIAGNOSIS — M25552 Pain in left hip: Secondary | ICD-10-CM | POA: Diagnosis not present

## 2022-03-08 DIAGNOSIS — M5116 Intervertebral disc disorders with radiculopathy, lumbar region: Secondary | ICD-10-CM | POA: Diagnosis not present

## 2022-03-08 DIAGNOSIS — M25552 Pain in left hip: Secondary | ICD-10-CM | POA: Diagnosis not present

## 2022-03-08 DIAGNOSIS — M9905 Segmental and somatic dysfunction of pelvic region: Secondary | ICD-10-CM | POA: Diagnosis not present

## 2022-03-08 DIAGNOSIS — M9903 Segmental and somatic dysfunction of lumbar region: Secondary | ICD-10-CM | POA: Diagnosis not present

## 2022-03-10 DIAGNOSIS — M9905 Segmental and somatic dysfunction of pelvic region: Secondary | ICD-10-CM | POA: Diagnosis not present

## 2022-03-10 DIAGNOSIS — M9903 Segmental and somatic dysfunction of lumbar region: Secondary | ICD-10-CM | POA: Diagnosis not present

## 2022-03-10 DIAGNOSIS — M5116 Intervertebral disc disorders with radiculopathy, lumbar region: Secondary | ICD-10-CM | POA: Diagnosis not present

## 2022-03-10 DIAGNOSIS — M25552 Pain in left hip: Secondary | ICD-10-CM | POA: Diagnosis not present

## 2022-03-11 DIAGNOSIS — M9905 Segmental and somatic dysfunction of pelvic region: Secondary | ICD-10-CM | POA: Diagnosis not present

## 2022-03-11 DIAGNOSIS — M9903 Segmental and somatic dysfunction of lumbar region: Secondary | ICD-10-CM | POA: Diagnosis not present

## 2022-03-11 DIAGNOSIS — Z01419 Encounter for gynecological examination (general) (routine) without abnormal findings: Secondary | ICD-10-CM | POA: Diagnosis not present

## 2022-03-11 DIAGNOSIS — M5116 Intervertebral disc disorders with radiculopathy, lumbar region: Secondary | ICD-10-CM | POA: Diagnosis not present

## 2022-03-11 DIAGNOSIS — Z1231 Encounter for screening mammogram for malignant neoplasm of breast: Secondary | ICD-10-CM | POA: Diagnosis not present

## 2022-03-11 DIAGNOSIS — M25552 Pain in left hip: Secondary | ICD-10-CM | POA: Diagnosis not present

## 2022-03-16 ENCOUNTER — Ambulatory Visit (INDEPENDENT_AMBULATORY_CARE_PROVIDER_SITE_OTHER): Payer: Medicare PPO | Admitting: Family Medicine

## 2022-03-16 VITALS — Wt 137.0 lb

## 2022-03-16 DIAGNOSIS — M9903 Segmental and somatic dysfunction of lumbar region: Secondary | ICD-10-CM | POA: Diagnosis not present

## 2022-03-16 DIAGNOSIS — M25552 Pain in left hip: Secondary | ICD-10-CM | POA: Diagnosis not present

## 2022-03-16 DIAGNOSIS — M9905 Segmental and somatic dysfunction of pelvic region: Secondary | ICD-10-CM | POA: Diagnosis not present

## 2022-03-16 DIAGNOSIS — M5116 Intervertebral disc disorders with radiculopathy, lumbar region: Secondary | ICD-10-CM | POA: Diagnosis not present

## 2022-03-16 DIAGNOSIS — Z0000 Encounter for general adult medical examination without abnormal findings: Secondary | ICD-10-CM

## 2022-03-16 NOTE — Patient Instructions (Signed)
CHECKLIST FOR A HEALTHY LIFE:  -Eat a healthy whole foods based diet, get regular physical activity, manage stress and engage in social connections - see below for specific suggestions and more information.  -Vaccines due:  -flu vaccine  Health Maintenance Due  Topic Date Due   INFLUENZA VACCINE  11/24/2021     -See a dentist at least yearly  -Get your eyes checked per your eye specialist's recommendations  -Other issues addressed today: -healthy whole food plant heavy diet (see below for details) -gradually start adding in some aerobic activity. Start slow with 5 minutes per day of an activity such as walking or cycling and gradually work up (add 5 minutes every 1-2 weeks) to goal of at least 150 minutes per week. If any chest discomfort, chest pain of difficulty breathing please stop and seek medical care. If you have any heart problems please check with your doctor before exercising. See below for additional instructions.   -Follow up: -yearly for annual wellness visit with primary care office    FOOD - THE FUEL FOR A HAPPY HEALTHY LIFE: -eat real food: lots of colorful vegetables (half the plate) -consume on a regular basis: whole grains (make sure first ingredient on label contains the word "whole"), fresh fruits, fish, nuts, seeds, healthy oils (such as olive oil, avocado oil, grapes seed oil) -may eat small amounts of dairy and lean meat on occasion, but avoid processed meats such as ham, bacon, lunch meat, etc. -drink water -try to avoid fast food and pre-packaged foods, processed meat -try to avoid foods that contain any ingredients with names you do not recognize  -try to avoid sugar/sweets (except for the natural sugar that  occurs in fresh fruit) -try to avoid sweet drinks -try to avoid white rice, white bread, pasta (unless whole grain), white or yellow potatoes  MOVE - the key to keeping your body moving and working best: -gradually increase intentional physical activity -move and stretch your body, legs, feet and arms when sitting for long periods -try to get at least 150 minutes per week, 20-30 minutes of sustained activity or two 10 minute episodes of sustained activity every day. If you need assistance with exercise you could consider the following community options:  -Silver sneakers https://tools.silversneakers.com  -Walk with a Doc: http://stephens-thompson.biz/  -try to include resistance (weight lifting/strength building) and balance exercises twice per week: or the following link for ideas: ChessContest.fr  UpdateClothing.com.cy  STRESS MANAGEMENT - so important for health and well being -try meditating, or just sitting quietly with deep breathing while intentionally relaxing all parts of your body for 5 minutes daily  SOCIAL CONNECTIONS: -options in [place] if you wish to engage in more social and exercise related activities:  -Silver sneakers https://tools.silversneakers.com  -Walk with a Doc: http://stephens-thompson.biz/  -Check out the Painter 50+ section on the [employer] (hiking clubs, book clubs, cards and games, chess, exercise classes, aquatic classes and much more) - see the website for details: https://www.[place].gov/departments/parks-recreation/active-adults50  -YouTube has lots of exercise videos for different ages and abilities as well  -[employer] (a variety of indoor and outdoor inperson activities for adults). [phone]. [address]  -Virtual Online Classes (a variety of topics): see seniorplanet.org or call  [phone]  -consider volunteering at a school, hospice center, church, senior center or elsewhere

## 2022-03-16 NOTE — Progress Notes (Signed)
PATIENT CHECK-IN and HEALTH RISK ASSESSMENT QUESTIONNAIRE:  -completed by phone/video for upcoming Medicare Preventive Visit  Pre-Visit Check-in: 1)Vitals (height, wt, BP, etc) - record in vitals section for visit on day of visit 2)Review and Update Medications, Allergies PMH, Surgeries, Social history in Epic 3)Hospitalizations in the last year with date/reason? No   4)Review and Update Care Team (patient's specialists) in Epic 5) Complete PHQ9 in Epic  6) Complete Fall Screening in Epic 7)Review all Health Maintenance Due and order under PCP if not done.  8)Medicare Wellness Questionnaire: Answer theses question about your habits: Do you drink alcohol? No  How many drinks do you have a day?n/a Have you ever smoked? yes Have you stopped smoking and date if applicable? 1965  How many packs a day do you smoke? N/a Do you use smokeless tobacco? no Do you use an illicit drugs?no Do you exercises?  No IF so, what type and how many days/minutes per week?n/a, physically active, but no formal exercise Are you sexually active? Yes Number of partners? 1  What did you eat for breakfast today (or yesterday)?toast and coffee Typical breakfast cereal  What did you eat for lunch today (or yesterday)?  Banana sandwich and yogurt  Typical lunch some type of sandwich What did you eat for diner today (or yesterday)?roast beef with potatoes and carrots  Typical dinner chicken and rice, or bean, broccoli casserole, usually 1 meat and a veg Typical snacks: cookies, popcorn  What beverages do you drink besides water:tea and coffee, mostly water  Answer theses question about you: Can you perform most household chores? yes Do you find it hard to follow a conversation in a noisy room? No - has hearing loss in 1 ear but feels does not interfere Do you find it hard to understand a speaker at church or in a meeting?no Do you often ask people to speak up or repeat themselves? no Do you experience ringing in  your ears? no Do you have difficulty understanding a soft or whispered voice? no Do you feel that you have a problem with memory? no Do you often misplace items?no Do you balance your checkbook and or bank acounts? yes Do you feel safe at home?yes Last dentist visit? Goes twice a year, last went in october Do you need assistance with any of the following: none  Driving?  Feeding yourself?  Getting from bed to chair?  Getting to the toilet?  Bathing or showering?  Dressing yourself?  Managing money?  Climbing a flight of stairs?  Preparing meals?  Do you have Advanced Directives in place (Living Will, Healthcare Power or Attorney)? yes   Last eye Exam and location? Had exam in summer with [facility]   Do you currently use prescribed or non-prescribed narcotic or opioid pain medications? no     ----------------------------------------------------------------------------------------------------------------------------------------------------------------------------------------------------------------------   MEDICARE ANNUAL PREVENTIVE VISIT WITH PROVIDER: (Welcome to Medicare, initial annual wellness or annual wellness exam)  Virtual Visit via Video Note  I connected with Vernie  on 03/16/2022 by a video enabled telemedicine application and verified that I am speaking with the correct person using two identifiers.  Location patient: home Location provider:work or home office Persons participating in the virtual visit: patient, provider  Concerns and/or follow up today: none  See HM section in Epic for other details of completed HM.    ROS: negative for report of fevers, unintentional weight loss, vision changes, vision loss, hearing loss or change, chest pain, sob, hemoptysis, melena, hematochezia, hematuria,  genital discharge or lesions, falls, bleeding or bruising, loc, thoughts of suicide or self harm, memory loss  Patient-completed extensive health risk  assessment - reviewed and discussed with the patient: See Health Risk Assessment completed with patient prior to the visit either above or in recent phone note. This was reviewed in detailed with the patient today and appropriate recommendations, orders and referrals were placed as needed per Summary below and patient instructions.   Review of Medical History: -PMH, [place], Family History and current specialty and care providers reviewed and updated and listed below   Patient Care Team: Carollee Herter, Alferd Apa, DO as PCP - General (Family Medicine) Jerelyn Charles, MD as Consulting Physician (Obstetrics) Latanya Maudlin, MD as Consulting Physician (Orthopedic Surgery) Harriett Sine, MD as Consulting Physician (Dermatology) Loletha Carrow Kirke Corin, MD as Consulting Physician (Gastroenterology)   Past Medical History:  Diagnosis Date   Allergy    Arthritis 07/2014   Right hand, prescribed prednisone-no longer taking. also Rt knww   Brain aneurysm    DVT (deep venous thrombosis) ([facility]) 2015   very small; treated with compression and heat, no other problems since   GERD (gastroesophageal reflux disease)    patient denies states she does have hx of peptic ulcer many years   Hepatitis    in 10 th grade  (not sure type- not hep C) - treated  no other problems   Hyperlipidemia    diet controlled   Memory loss    MVP (mitral valve prolapse)    no problems, MD told patient she could not hear it   OSA (obstructive sleep apnea) 10/08/2021   Osteoarthritis    Osteopenia    [facility]   PONV (postoperative nausea and vomiting)    with ear surgery only - No other problems with other surgeries   Precancerous lesion 12/2014   Treated with cream only   TGA (transient global amnesia)    Varicose veins    Vitamin D deficiency     Past Surgical History:  Procedure Laterality Date   APPENDECTOMY     BUNIONECTOMY WITH HAMMERTOE RECONSTRUCTION Left 04/22/2016   Procedure: MODIFIED MCBRIDE BUNIONECTOMY,   LEFT SECOND HAMMERTOE CORRECTION;  Surgeon: Wylene Simmer, MD;  Location: [facility];  Service: Orthopedics;  Laterality: Left;   COLONOSCOPY     07/2012 Olevia Perches - [facility]/father - polyps   colonoscopy with polypectomy  07/2012    hyperplastic;Dr Brodie   DILATION AND CURETTAGE OF UTERUS     DILATION AND CURETTAGE OF UTERUS N/A 09/03/2014   Procedure: DILATATION AND CURETTAGE;  Surgeon: Harle Battiest, MD;  Location: [facility];  Service: Gynecology;  Laterality: N/A;   ENDOVENOUS ABLATION SAPHENOUS VEIN W/ LASER Left 01-31-2014   EVLA  left greater saphenous vein  by Curt Jews MD   ENDOVENOUS ABLATION SAPHENOUS VEIN W/ LASER Right 03-14-2014   endovenous laser ablation right greater saphenous vein  by Curt Jews MD   FOOT ARTHRODESIS Left 04/22/2016   Procedure: LEFT FIRST TARSAL METATARSAL ARTHRODESIS;  Surgeon: Wylene Simmer, MD;  Location: [place];  Service: Orthopedics;  Laterality: Left;   G 3 P 3     HYSTEROSCOPY WITH D & C N/A 03/25/2016   Procedure: DILATATION AND CURETTAGE /HYSTEROSCOPY;  Surgeon: Jerelyn Charles, MD;  Location: [facility];  Service: Gynecology;  Laterality: N/A;  WITH ULTRASOUND GUIDANCE   left ear surgery     STAPEDECTOMY     TARSAL METATARSAL FUSION WITH WEIL OSTEOTOMY Left  04/22/2016   Procedure: 2-4 TARSAL METATARSAL FUSION WITH WEIL OSTEOTOMY;  Surgeon: Wylene Simmer, MD;  Location: [facility];  Service: Orthopedics;  Laterality: Left;   TUBAL LIGATION      Social History   Socioeconomic History   Marital status: Married    Spouse name: [name]   Number of children: Not on file   Years of education: Not on file   Highest education level: Not on file  Occupational History   Occupation: retired  Tobacco Use   Smoking status: Former    Types: Cigarettes    Quit date: 04/27/1963    Years since quitting: 58.9   Smokeless tobacco: Never   Tobacco comments:    Smoked < 6 mos;< 1 pack total;quit 1965  Vaping Use    Vaping Use: Never used  Substance and Sexual Activity   Alcohol use: No   Drug use: No   Sexual activity: Yes    Birth control/protection: Post-menopausal  Other Topics Concern   Not on file  Social History Narrative   Lives with husband   Right Handed   Drinks average 2-3 cups caffeine daily   Social Determinants of Health   Financial Resource Strain: Low Risk  (02/25/2021)   Overall Financial Resource Strain (CARDIA)    Difficulty of Paying Living Expenses: Not hard at all  Food Insecurity: No Food Insecurity (02/25/2021)   Hunger Vital Sign    Worried About Running Out of Food in the Last Year: Never true    [place] in the Last Year: Never true  Transportation Needs: No Transportation Needs (02/25/2021)   PRAPARE - Hydrologist (Medical): No    Lack of Transportation (Non-Medical): No  Physical Activity: Inactive (02/25/2021)   Exercise Vital Sign    Days of Exercise per Week: 0 days    Minutes of Exercise per Session: 0 min  Stress: No Stress Concern Present (02/25/2021)   [place]    Feeling of Stress : Not at all  Social Connections: Moderately Integrated (02/25/2021)   Social Connection and Isolation Panel [NHANES]    Frequency of Communication with Friends and Family: More than three times a week    Frequency of Social Gatherings with Friends and Family: More than three times a week    Attends Religious Services: More than 4 times per year    Active Member of [employer] or Organizations: No    Attends Archivist Meetings: Never    Marital Status: Married  Human resources officer Violence: Not At Risk (02/25/2021)   Humiliation, Afraid, Rape, and Kick questionnaire    Fear of Current or Ex-Partner: No    Emotionally Abused: No    Physically Abused: No    Sexually Abused: No    Family History  Problem Relation Age of Onset   Pancreatic cancer Mother     Miscarriages / [place] Mother    Thyroid disease Mother        partial thyroidectomy ; S/P RAI   Heart attack Father        in early 75s   Colon cancer Father    Seizures Sister    Ovarian cancer Maternal Aunt    Breast cancer Paternal Aunt    Aneurysm Paternal Uncle        brain   Colon cancer Paternal Grandmother    Stroke Paternal Grandmother        > 71  Heart attack Paternal Grandfather        in 4s   Cancer Other        among paternal sibs (colon, breast, bone)   Diabetes Neg Hx    Stomach cancer Neg Hx    Ulcerative colitis Neg Hx    Esophageal cancer Neg Hx    Rectal cancer Neg Hx     Current Outpatient Medications on File Prior to Visit  Medication Sig Dispense Refill   Ascorbic Acid (VITA-C PO) Take by mouth.     aspirin EC 81 MG tablet Take 1 tablet (81 mg total) by mouth daily. Swallow whole. 30 tablet 12   atorvastatin (LIPITOR) 40 MG tablet Take 1 tablet (40 mg total) by mouth daily. 90 tablet 3   Boswellia-Glucosamine-Vit D (OSTEO BI-FLEX ONE PER DAY PO) Take 1 tablet by mouth 2 (two) times daily.     cetirizine (ZYRTEC) 10 MG tablet Take 10 mg by mouth daily as needed for allergies.     Cholecalciferol (VITAMIN D-3) 25 MCG (1000 UT) CAPS Take 1 capsule by mouth 2 (two) times daily.     escitalopram (LEXAPRO) 5 MG tablet Take 1 tablet (5 mg total) by mouth daily. (Patient taking differently: Take 5 mg by mouth in the morning and at bedtime.) 90 tablet 1   Glucosamine HCl-MSM (MSM GLUCOSAMINE PO) Take 2 tablets by mouth daily.     hydrocortisone cream 1 % Apply to affected area 2 times daily 15 g 0   MAGNESIUM PO Take 1 capsule by mouth daily.     Multiple Vitamins-Minerals (MULTIVITAMIN ADULTS 50+) TABS Take 1 tablet by mouth daily.     NONFORMULARY OR COMPOUNDED ITEM compression socks 20-30 mm/ hg 1 each 0   Omega-3 Fatty Acids (FISH OIL PO) Take 1 capsule by mouth daily.     triamcinolone (NASACORT) 55 MCG/ACT AERO nasal inhaler Place 2 sprays into the  nose daily as needed (allergies).      triamcinolone cream (KENALOG) 0.1 % Apply 1 application topically 2 (two) times daily as needed (eczema.).      Ubiquinol 100 MG CAPS Take 1 capsule by mouth 2 (two) times daily.     UNABLE TO FIND Take 1 tablet by mouth 3 (three) times daily. Med Name: bone strength- plant based calcium     predniSONE (DELTASONE) 10 MG tablet 4 tab po day 1 and day 2. 3 tab po day 3 and day 4 2 tab po day 5 and day 6.  1 tab po day 7 and day 8 (Patient not taking: Reported on 03/16/2022) 20 tablet 0   No current facility-administered medications on file prior to visit.    Allergies  Allergen Reactions   Macrobid  [Nitrofurantoin Macrocrystal] Other (See Comments)   Doxycycline Hives   Pravastatin Sodium     REACTION: shin pain   Amoxicillin-Pot Clavulanate     GI symptoms; abdominal pain & nausea NOTE: able to take Ampicillin   Augmentin [Amoxicillin-Pot Clavulanate] Nausea Only    Upsets stomach   Codeine Nausea Only   Methylphenidate Hcl Palpitations   Methylprednisolone Rash    Rash and redness on face   Sulfamethoxazole-Trimethoprim Nausea Only     nausea       Physical Exam There were no vitals filed for this visit. Estimated body mass index is 24.27 kg/m as calculated from the following:   Height as of 02/24/22: '5\' 3"'$  (1.6 m).   Weight as of this encounter: 137 lb (62.1  kg).  EKG (optional): deferred due to virtual visit  GENERAL: alert, oriented, appears well and in no acute distress; visual acuity grossly intact, full vision exam deferred due to pandemic and/or virtual encounter  HEENT: atraumatic, conjunttiva clear, no obvious abnormalities on inspection of external nose and ears  NECK: normal movements of the head and neck  LUNGS: on inspection no signs of respiratory distress, breathing rate appears normal, no obvious gross SOB, gasping or wheezing  CV: no obvious cyanosis  MS: moves all visible extremities without noticeable  abnormality  PSYCH/NEURO: pleasant and cooperative, no obvious depression or anxiety, speech and thought processing grossly intact, Cognitive function grossly intact  [place] Office Visit from 10/22/2021 in [place] at [facility]  PHQ-9 Total Score 3           03/16/2022   12:36 PM 10/22/2021    9:44 AM 02/25/2021    9:49 AM 10/21/2020    1:13 PM 12/28/2019    9:41 AM  Depression screen PHQ 2/9  Decreased Interest 0 0 0 0 0  Down, Depressed, Hopeless 0 3 0 0 0  PHQ - 2 Score 0 3 0 0 0  Altered sleeping  0     Tired, decreased energy  0     Change in appetite  0     Feeling bad or failure about yourself   0     Trouble concentrating  0     Moving slowly or fidgety/restless  0     Suicidal thoughts  0     PHQ-9 Score  3     Difficult doing work/chores  Not difficult at all          10/01/2021   12:49 AM 10/22/2021    9:51 AM 12/21/2021    2:56 PM 03/15/2022    2:07 PM 03/16/2022   12:35 PM  Fall Risk  Falls in the past year?  '1  1 1  '$ Was there an injury with Fall?  0  0 0  Fall Risk Category Calculator  '1  1 1  '$ Fall Risk Category  Low  Low Low  Patient Fall Risk Level Low fall risk Low fall risk Low fall risk  Low fall risk  Patient at Risk for Falls Due to     No Fall Risks  Fall risk Follow up  Falls evaluation completed   Falls evaluation completed    SUMMARY AND PLAN:  Medicare annual wellness visit, subsequent     Reviewed all health maintenance measures due. The following health maintenance/preventive care measures were recommended per pt preference, need:   Vaccines: due for flue vaccine.   Education and counseling on the following was provided based on the above review of health and a plan/checklist for the patient, along with additional information discussed, was provided for the patient in the patient instructions :  -Advised and counseled at length on maintaining healthy weight and healthy lifestyle - including the  importance of a health diet, regular physical activity, social connections and stress management. -Advised and counseled on a whole foods based healthy diet and regular exercise: discussed a heart healthy whole foods based diet at length. A summary of a healthy diet was provided in the Patient Instructions.Offered referral to dietician/weight management clinic if applicable and follow up virtual visits to assist further and monitor progress. Recommended regular exercise and discussed options within the community.  -Advised yearly dental visits at minimum and regular eye exams  Follow  up: see patient instructions     Patient Instructions  CHECKLIST FOR A HEALTHY LIFE:  -Eat a healthy whole foods based diet, get regular physical activity, manage stress and engage in social connections - see below for specific suggestions and more information.  -Vaccines due:  -flu vaccine  Health Maintenance Due  Topic Date Due   INFLUENZA VACCINE  11/24/2021     -See a dentist at least yearly  -Get your eyes checked per your eye specialist's recommendations  -Other issues addressed today: -healthy whole food plant heavy diet (see below for details) -gradually start adding in some aerobic activity. Start slow with 5 minutes per day of an activity such as walking or cycling and gradually work up (add 5 minutes every 1-2 weeks) to goal of at least 150 minutes per week. If any chest discomfort, chest pain of difficulty breathing please stop and seek medical care. If you have any heart problems please check with your doctor before exercising. See below for additional instructions.   -Follow up: -yearly for annual wellness visit with primary care  office    FOOD - THE FUEL FOR A HAPPY HEALTHY LIFE: -eat real food: lots of colorful vegetables (half the plate) -consume on a regular basis: whole grains (make sure first ingredient on label contains the word "whole"), fresh fruits, fish, nuts, seeds, healthy oils (such as olive oil, avocado oil, grapes seed oil) -may eat small amounts of dairy and lean meat on occasion, but avoid processed meats such as ham, bacon, lunch meat, etc. -drink water -try to avoid fast food and pre-packaged foods, processed meat -try to avoid foods that contain any ingredients with names you do not recognize  -try to avoid sugar/sweets (except for the natural sugar that occurs in fresh fruit) -try to avoid sweet drinks -try to avoid white rice, white bread, pasta (unless whole grain), white or yellow potatoes  MOVE - the key to keeping your body moving and working best: -gradually increase intentional physical activity -move and stretch your body, legs, feet and arms when sitting for long periods -try to get at least 150 minutes per week, 20-30 minutes of sustained activity or two 10 minute episodes of sustained activity every day. If you need assistance with exercise you could consider the following community options:  -Silver sneakers https://tools.silversneakers.com  -Walk with a Doc: http://stephens-thompson.biz/  -try to include resistance (weight lifting/strength building) and balance exercises twice per week: or the following link for ideas: ChessContest.fr  UpdateClothing.com.cy  STRESS MANAGEMENT - so important for health and well being -try meditating, or just  sitting quietly with deep breathing while intentionally relaxing all parts of your body for 5 minutes daily  SOCIAL CONNECTIONS: -options in [place] if you wish to engage in more social and exercise related activities:  -Silver sneakers https://tools.silversneakers.com  -Walk with a Doc: http://stephens-thompson.biz/  -Check out the [place] 50+ section on the [place] of [employer] (hiking clubs, book clubs, cards and games, chess, exercise classes, aquatic classes and much more) - see the website for details: https://www.-[place].gov/departments/parks-recreation/active-adults50  -YouTube has lots of exercise videos for different ages and abilities as well  -[place] (a variety of indoor and outdoor inperson activities for adults). (458) 641-6200. [address]  -Virtual Online Classes (a variety of topics): see seniorplanet.org or call 575-573-7203  -consider volunteering at a school, hospice center, church, senior center or elsewhere         Lucretia Kern, DO

## 2022-03-22 DIAGNOSIS — M5116 Intervertebral disc disorders with radiculopathy, lumbar region: Secondary | ICD-10-CM | POA: Diagnosis not present

## 2022-03-22 DIAGNOSIS — M25552 Pain in left hip: Secondary | ICD-10-CM | POA: Diagnosis not present

## 2022-03-22 DIAGNOSIS — M9903 Segmental and somatic dysfunction of lumbar region: Secondary | ICD-10-CM | POA: Diagnosis not present

## 2022-03-22 DIAGNOSIS — M9905 Segmental and somatic dysfunction of pelvic region: Secondary | ICD-10-CM | POA: Diagnosis not present

## 2022-03-24 DIAGNOSIS — M9905 Segmental and somatic dysfunction of pelvic region: Secondary | ICD-10-CM | POA: Diagnosis not present

## 2022-03-24 DIAGNOSIS — M5116 Intervertebral disc disorders with radiculopathy, lumbar region: Secondary | ICD-10-CM | POA: Diagnosis not present

## 2022-03-24 DIAGNOSIS — M25552 Pain in left hip: Secondary | ICD-10-CM | POA: Diagnosis not present

## 2022-03-24 DIAGNOSIS — M9903 Segmental and somatic dysfunction of lumbar region: Secondary | ICD-10-CM | POA: Diagnosis not present

## 2022-03-25 DIAGNOSIS — M5116 Intervertebral disc disorders with radiculopathy, lumbar region: Secondary | ICD-10-CM | POA: Diagnosis not present

## 2022-03-25 DIAGNOSIS — M9905 Segmental and somatic dysfunction of pelvic region: Secondary | ICD-10-CM | POA: Diagnosis not present

## 2022-03-25 DIAGNOSIS — M25552 Pain in left hip: Secondary | ICD-10-CM | POA: Diagnosis not present

## 2022-03-25 DIAGNOSIS — M9903 Segmental and somatic dysfunction of lumbar region: Secondary | ICD-10-CM | POA: Diagnosis not present

## 2022-03-26 DIAGNOSIS — R319 Hematuria, unspecified: Secondary | ICD-10-CM | POA: Diagnosis not present

## 2022-03-29 DIAGNOSIS — M9905 Segmental and somatic dysfunction of pelvic region: Secondary | ICD-10-CM | POA: Diagnosis not present

## 2022-03-29 DIAGNOSIS — M25552 Pain in left hip: Secondary | ICD-10-CM | POA: Diagnosis not present

## 2022-03-29 DIAGNOSIS — M9903 Segmental and somatic dysfunction of lumbar region: Secondary | ICD-10-CM | POA: Diagnosis not present

## 2022-03-29 DIAGNOSIS — M5116 Intervertebral disc disorders with radiculopathy, lumbar region: Secondary | ICD-10-CM | POA: Diagnosis not present

## 2022-04-05 DIAGNOSIS — M9903 Segmental and somatic dysfunction of lumbar region: Secondary | ICD-10-CM | POA: Diagnosis not present

## 2022-04-05 DIAGNOSIS — M25552 Pain in left hip: Secondary | ICD-10-CM | POA: Diagnosis not present

## 2022-04-05 DIAGNOSIS — M5116 Intervertebral disc disorders with radiculopathy, lumbar region: Secondary | ICD-10-CM | POA: Diagnosis not present

## 2022-04-05 DIAGNOSIS — M9905 Segmental and somatic dysfunction of pelvic region: Secondary | ICD-10-CM | POA: Diagnosis not present

## 2022-04-06 ENCOUNTER — Encounter: Payer: Self-pay | Admitting: Family Medicine

## 2022-04-06 DIAGNOSIS — F418 Other specified anxiety disorders: Secondary | ICD-10-CM

## 2022-04-06 MED ORDER — ESCITALOPRAM OXALATE 5 MG PO TABS: 5.0000 mg | ORAL_TABLET | Freq: Every day | ORAL | 1 refills | Status: DC

## 2022-04-12 DIAGNOSIS — M5116 Intervertebral disc disorders with radiculopathy, lumbar region: Secondary | ICD-10-CM | POA: Diagnosis not present

## 2022-04-12 DIAGNOSIS — M9905 Segmental and somatic dysfunction of pelvic region: Secondary | ICD-10-CM | POA: Diagnosis not present

## 2022-04-12 DIAGNOSIS — M25552 Pain in left hip: Secondary | ICD-10-CM | POA: Diagnosis not present

## 2022-04-12 DIAGNOSIS — M9903 Segmental and somatic dysfunction of lumbar region: Secondary | ICD-10-CM | POA: Diagnosis not present

## 2022-04-27 ENCOUNTER — Encounter: Payer: Self-pay | Admitting: Family Medicine

## 2022-04-28 DIAGNOSIS — M9903 Segmental and somatic dysfunction of lumbar region: Secondary | ICD-10-CM | POA: Diagnosis not present

## 2022-04-28 DIAGNOSIS — M5116 Intervertebral disc disorders with radiculopathy, lumbar region: Secondary | ICD-10-CM | POA: Diagnosis not present

## 2022-04-28 DIAGNOSIS — M9905 Segmental and somatic dysfunction of pelvic region: Secondary | ICD-10-CM | POA: Diagnosis not present

## 2022-04-28 DIAGNOSIS — M25552 Pain in left hip: Secondary | ICD-10-CM | POA: Diagnosis not present

## 2022-04-29 ENCOUNTER — Emergency Department (HOSPITAL_COMMUNITY): Payer: Medicare PPO

## 2022-04-29 ENCOUNTER — Other Ambulatory Visit: Payer: Self-pay

## 2022-04-29 ENCOUNTER — Emergency Department (HOSPITAL_COMMUNITY)
Admission: EM | Admit: 2022-04-29 | Discharge: 2022-04-29 | Discharge status: Against Medical Advice | Payer: Medicare PPO | Attending: Student | Admitting: Student

## 2022-04-29 DIAGNOSIS — M25561 Pain in right knee: Secondary | ICD-10-CM | POA: Diagnosis not present

## 2022-04-29 DIAGNOSIS — R0789 Other chest pain: Secondary | ICD-10-CM | POA: Diagnosis not present

## 2022-04-29 DIAGNOSIS — Z5321 Procedure and treatment not carried out due to patient leaving prior to being seen by health care provider: Secondary | ICD-10-CM | POA: Insufficient documentation

## 2022-04-29 DIAGNOSIS — R079 Chest pain, unspecified: Secondary | ICD-10-CM | POA: Diagnosis not present

## 2022-04-29 LAB — BASIC METABOLIC PANEL
Anion gap: 11 (ref 5–15)
BUN: 19 mg/dL (ref 8–23)
CO2: 25 mmol/L (ref 22–32)
Calcium: 9.3 mg/dL (ref 8.9–10.3)
Chloride: 105 mmol/L (ref 98–111)
Creatinine, Ser: 0.73 mg/dL (ref 0.44–1.00)
GFR, Estimated: >60 mL/min (ref >60)
Glucose, Bld: 96 mg/dL (ref 70–99)
Potassium: 4.1 mmol/L (ref 3.5–5.1)
Sodium: 141 mmol/L (ref 135–145)

## 2022-04-29 LAB — CBC
HCT: 41.7 % (ref 36.0–46.0)
Hemoglobin: 13.7 g/dL (ref 12.0–15.0)
MCH: 31.4 pg (ref 26.0–34.0)
MCHC: 32.9 g/dL (ref 30.0–36.0)
MCV: 95.4 fL (ref 80.0–100.0)
Platelets: 310 10*3/uL (ref 150–400)
RBC: 4.37 MIL/uL (ref 3.87–5.11)
RDW: 12.9 % (ref 11.5–15.5)
WBC: 6.9 10*3/uL (ref 4.0–10.5)
nRBC: 0 % (ref 0.0–0.2)

## 2022-04-29 LAB — TROPONIN I (HIGH SENSITIVITY): Troponin I (High Sensitivity): 3 ng/L (ref <18)

## 2022-04-29 NOTE — ED Triage Notes (Signed)
Patient reports that she has had intermittent left chest pain since 0400 today. Patient denies any SOB or nausea.

## 2022-04-29 NOTE — ED Provider Triage Note (Signed)
Emergency Medicine Provider Triage Evaluation Note  LILIANAH BUFFIN , a 77 y.o. female  was evaluated in triage.  Patient presenting with chest pain.  She woke up at 4 this morning and was having some left-sided chest pain.  She ended up being able to go back to bed and then she went to the orthopedic urgent care today and had an acute episode of pain on the left side that radiated through to her back.  No history of ACS, hypertension.  She is not diaphoretic, dizzy or presyncopal.  Says that the pain has let up at this time but it still there dully  Blood pressure 200s/120s in triage.  Review of Systems  Positive:  Negative:   Physical Exam  There were no vitals taken for this visit. Gen:   Awake, no distress   Resp:  Normal effort  MSK:   Moves extremities without difficulty  Other:  RRR, lung sounds clear.  Not diaphoretic, well-appearing and ambulatory  Medical Decision Making  Medically screening exam initiated at 12:15 PM.  Appropriate orders placed.  ELKY FUNCHES was informed that the remainder of the evaluation will be completed by another provider, this initial triage assessment does not replace that evaluation, and the importance of remaining in the ED until their evaluation is complete.     Rhae Hammock, PA-C 04/29/22 1217

## 2022-04-30 ENCOUNTER — Encounter: Payer: Self-pay | Admitting: Family Medicine

## 2022-05-03 ENCOUNTER — Telehealth: Payer: Self-pay | Admitting: Family Medicine

## 2022-05-03 NOTE — Telephone Encounter (Signed)
Patient scheduled her follow up but mentioned that she was given prednisone from her orthopaedic doctor and she is developing a rash on her face. She said it is itchy but no swelling or pain. Patient has called a couple of numbers at the orthapedist but no response.

## 2022-05-05 DIAGNOSIS — M9903 Segmental and somatic dysfunction of lumbar region: Secondary | ICD-10-CM | POA: Diagnosis not present

## 2022-05-05 DIAGNOSIS — M5116 Intervertebral disc disorders with radiculopathy, lumbar region: Secondary | ICD-10-CM | POA: Diagnosis not present

## 2022-05-05 DIAGNOSIS — M9905 Segmental and somatic dysfunction of pelvic region: Secondary | ICD-10-CM | POA: Diagnosis not present

## 2022-05-05 DIAGNOSIS — M25552 Pain in left hip: Secondary | ICD-10-CM | POA: Diagnosis not present

## 2022-05-06 ENCOUNTER — Ambulatory Visit (INDEPENDENT_AMBULATORY_CARE_PROVIDER_SITE_OTHER): Payer: Medicare PPO | Admitting: Family Medicine

## 2022-05-06 ENCOUNTER — Encounter: Payer: Self-pay | Admitting: Family Medicine

## 2022-05-06 ENCOUNTER — Ambulatory Visit: Payer: Medicare PPO | Admitting: Family Medicine

## 2022-05-06 VITALS — BP 136/80 | HR 65 | Temp 97.6°F | Resp 18 | Ht 63.5 in | Wt 138.4 lb

## 2022-05-06 DIAGNOSIS — M1711 Unilateral primary osteoarthritis, right knee: Secondary | ICD-10-CM | POA: Diagnosis not present

## 2022-05-06 DIAGNOSIS — R079 Chest pain, unspecified: Secondary | ICD-10-CM | POA: Diagnosis not present

## 2022-05-06 DIAGNOSIS — E785 Hyperlipidemia, unspecified: Secondary | ICD-10-CM

## 2022-05-06 LAB — COMPREHENSIVE METABOLIC PANEL
ALT: 23 U/L (ref 0–35)
AST: 20 U/L (ref 0–37)
Albumin: 4.1 g/dL (ref 3.5–5.2)
Alkaline Phosphatase: 69 U/L (ref 39–117)
BUN: 14 mg/dL (ref 6–23)
CO2: 30 mEq/L (ref 19–32)
Calcium: 10 mg/dL (ref 8.4–10.5)
Chloride: 105 mEq/L (ref 96–112)
Creatinine, Ser: 0.9 mg/dL (ref 0.40–1.20)
GFR: 61.9 mL/min (ref >60.00)
Glucose, Bld: 91 mg/dL (ref 70–99)
Potassium: 4.2 mEq/L (ref 3.5–5.1)
Sodium: 143 mEq/L (ref 135–145)
Total Bilirubin: 0.7 mg/dL (ref 0.2–1.2)
Total Protein: 6.5 g/dL (ref 6.0–8.3)

## 2022-05-06 LAB — LIPID PANEL
Cholesterol: 166 mg/dL (ref 0–200)
HDL: 92.2 mg/dL (ref >39.00)
LDL Cholesterol: 60 mg/dL (ref 0–99)
NonHDL: 74.19
Total CHOL/HDL Ratio: 2
Triglycerides: 70 mg/dL (ref 0.0–149.0)
VLDL: 14 mg/dL (ref 0.0–40.0)

## 2022-05-06 LAB — CBC WITH DIFFERENTIAL/PLATELET
Basophils Absolute: 0.1 10*3/uL (ref 0.0–0.1)
Basophils Relative: 1.4 % (ref 0.0–3.0)
Eosinophils Absolute: 0.5 10*3/uL (ref 0.0–0.7)
Eosinophils Relative: 9.4 % — ABNORMAL HIGH (ref 0.0–5.0)
HCT: 41.9 % (ref 36.0–46.0)
Hemoglobin: 13.9 g/dL (ref 12.0–15.0)
Lymphocytes Relative: 37.4 % (ref 12.0–46.0)
Lymphs Abs: 2 10*3/uL (ref 0.7–4.0)
MCHC: 33.3 g/dL (ref 30.0–36.0)
MCV: 94 fl (ref 78.0–100.0)
Monocytes Absolute: 0.5 10*3/uL (ref 0.1–1.0)
Monocytes Relative: 9.4 % (ref 3.0–12.0)
Neutro Abs: 2.2 10*3/uL (ref 1.4–7.7)
Neutrophils Relative %: 42.4 % — ABNORMAL LOW (ref 43.0–77.0)
Platelets: 349 10*3/uL (ref 150.0–400.0)
RBC: 4.45 Mil/uL (ref 3.87–5.11)
RDW: 13.5 % (ref 11.5–15.5)
WBC: 5.2 10*3/uL (ref 4.0–10.5)

## 2022-05-06 NOTE — Assessment & Plan Note (Signed)
Encourage heart healthy diet such as MIND or DASH diet, increase exercise, avoid trans fats, simple carbohydrates and processed foods, consider a krill or fish or flaxseed oil cap daily.  °

## 2022-05-06 NOTE — Assessment & Plan Note (Signed)
Off and on for few days ----- none since er visit but pt is planning to have a knee replacement  Refer to cardiology

## 2022-05-06 NOTE — Progress Notes (Signed)
Subjective:   By signing my name below, I, [name], attest that this documentation has been prepared under the direction and in the presence of Ann Held, DO. 05/06/2022   Patient ID: Kim Mclaughlin, female    DOB: 05-10-1945, 77 y.o.   MRN: [id number]  Chief Complaint  Patient presents with   Hyperlipidemia   Follow-up    HPI Patient is in today for a follow up visit.   She has not had any chest pain since ER visit. She reports her last episode of chest pain lasted 15 minutes. Her pain started at her chest and radiated around to her back. Her EKG showed no new issues while in the ER. She is interested in seeing a cardiologist for further evaluation.  She has an appointment with her orthopedist specialist for an injection in her right knee to manage her pain. She denies any swelling in her ankles. She was given 5 mg prednisone and reports experiencing a bad reaction with acne breakout on her face. She has tried the 10 mg variation and reports no new issues while taking it. She does not remember eating anything new that day she experienced it.    Past Medical History:  Diagnosis Date   Allergy    Arthritis 07/2014   Right hand, prescribed prednisone-no longer taking. also Rt knww   Brain aneurysm    DVT (deep venous thrombosis) ([place]) 2015   very small; treated with compression and heat, no other problems since   GERD (gastroesophageal reflux disease)    patient denies states she does have hx of peptic ulcer many years   Hepatitis    in 10 th grade  (not sure type- not hep C) - treated  no other problems   Hyperlipidemia    diet controlled   Memory loss    MVP (mitral valve prolapse)    no problems, MD told patient she could not hear it   OSA (obstructive sleep apnea) 10/08/2021   Osteoarthritis    Osteopenia    [facility]   PONV (postoperative nausea and vomiting)    with ear surgery only - No other problems with other surgeries   Precancerous lesion 12/2014    Treated with cream only   TGA (transient global amnesia)    Varicose veins    Vitamin D deficiency     Past Surgical History:  Procedure Laterality Date   APPENDECTOMY     BUNIONECTOMY WITH HAMMERTOE RECONSTRUCTION Left 04/22/2016   Procedure: MODIFIED MCBRIDE BUNIONECTOMY,  LEFT SECOND HAMMERTOE CORRECTION;  Surgeon: Wylene Simmer, MD;  Location: [place];  Service: Orthopedics;  Laterality: Left;   COLONOSCOPY     07/2012 Olevia Perches - [facility]/father - polyps   colonoscopy with polypectomy  07/2012    hyperplastic;Dr Brodie   DILATION AND CURETTAGE OF UTERUS     DILATION AND CURETTAGE OF UTERUS N/A 09/03/2014   Procedure: DILATATION AND CURETTAGE;  Surgeon: Harle Battiest, MD;  Location: [facility];  Service: Gynecology;  Laterality: N/A;   ENDOVENOUS ABLATION SAPHENOUS VEIN W/ LASER Left 01-31-2014   EVLA  left greater saphenous vein  by Curt Jews MD   ENDOVENOUS ABLATION SAPHENOUS VEIN W/ LASER Right 03-14-2014   endovenous laser ablation right greater saphenous vein  by Curt Jews MD   FOOT ARTHRODESIS Left 04/22/2016   Procedure: LEFT FIRST TARSAL METATARSAL ARTHRODESIS;  Surgeon: Wylene Simmer, MD;  Location: [place];  Service: Orthopedics;  Laterality: Left;  G 3 P 3     HYSTEROSCOPY WITH D & C N/A 03/25/2016   Procedure: DILATATION AND CURETTAGE /HYSTEROSCOPY;  Surgeon: Jerelyn Charles, MD;  Location: [facility];  Service: Gynecology;  Laterality: N/A;  WITH ULTRASOUND GUIDANCE   left ear surgery     STAPEDECTOMY     TARSAL METATARSAL FUSION WITH WEIL OSTEOTOMY Left 04/22/2016   Procedure: 2-4 TARSAL METATARSAL FUSION WITH WEIL OSTEOTOMY;  Surgeon: Wylene Simmer, MD;  Location: [place];  Service: Orthopedics;  Laterality: Left;   TUBAL LIGATION      Family History  Problem Relation Age of Onset   Pancreatic cancer Mother    Miscarriages / [place] Mother    Thyroid disease Mother        partial thyroidectomy ; S/P RAI    Heart attack Father        in early 27s   Colon cancer Father    Seizures Sister    Ovarian cancer Maternal Aunt    Breast cancer Paternal Aunt    Aneurysm Paternal Uncle        brain   Colon cancer Paternal Grandmother    Stroke Paternal Grandmother        > 48   Heart attack Paternal Grandfather        in 21s   Cancer Other        among paternal sibs (colon, breast, bone)   Diabetes Neg Hx    Stomach cancer Neg Hx    Ulcerative colitis Neg Hx    Esophageal cancer Neg Hx    Rectal cancer Neg Hx     Social History   Socioeconomic History   Marital status: Married    Spouse name: [name]   Number of children: Not on file   Years of education: Not on file   Highest education level: Not on file  Occupational History   Occupation: retired  Tobacco Use   Smoking status: Former    Types: Cigarettes    Quit date: 04/27/1963    Years since quitting: 59.0   Smokeless tobacco: Never   Tobacco comments:    Smoked < 6 mos;< 1 pack total;quit 1965  Vaping Use   Vaping Use: Never used  Substance and Sexual Activity   Alcohol use: No   Drug use: No   Sexual activity: Yes    Birth control/protection: Post-menopausal  Other Topics Concern   Not on file  Social History Narrative   Lives with husband   Right Handed   Drinks average 2-3 cups caffeine daily   Social Determinants of Health   Financial Resource Strain: Low Risk  (02/25/2021)   Overall Financial Resource Strain (CARDIA)    Difficulty of Paying Living Expenses: Not hard at all  Food Insecurity: No Food Insecurity (02/25/2021)   Hunger Vital Sign    Worried About Running Out of Food in the Last Year: Never true    Ran Out of Food in the Last Year: Never true  Transportation Needs: No Transportation Needs (02/25/2021)   PRAPARE - Hydrologist (Medical): No    Lack of Transportation (Non-Medical): No  Physical Activity: Inactive (02/25/2021)   Exercise Vital Sign    Days of Exercise per  Week: 0 days    Minutes of Exercise per Session: 0 min  Stress: No Stress Concern Present (02/25/2021)   [place]    Feeling of Stress : Not at  all  Social Connections: Moderately Integrated (02/25/2021)   Social Connection and Isolation Panel [NHANES]    Frequency of Communication with Friends and Family: More than three times a week    Frequency of Social Gatherings with Friends and Family: More than three times a week    Attends Religious Services: More than 4 times per year    Active Member of [employer] or Organizations: No    Attends Archivist Meetings: Never    Marital Status: Married  Human resources officer Violence: Not At Risk (02/25/2021)   Humiliation, Afraid, Rape, and Kick questionnaire    Fear of Current or Ex-Partner: No    Emotionally Abused: No    Physically Abused: No    Sexually Abused: No    Outpatient Medications Prior to Visit  Medication Sig Dispense Refill   Ascorbic Acid (VITA-C PO) Take by mouth.     aspirin EC 81 MG tablet Take 1 tablet (81 mg total) by mouth daily. Swallow whole. 30 tablet 12   atorvastatin (LIPITOR) 40 MG tablet Take 1 tablet (40 mg total) by mouth daily. 90 tablet 3   Boswellia-Glucosamine-Vit D (OSTEO BI-FLEX ONE PER DAY PO) Take 1 tablet by mouth 2 (two) times daily.     cetirizine (ZYRTEC) 10 MG tablet Take 10 mg by mouth daily as needed for allergies.     Cholecalciferol (VITAMIN D-3) 25 MCG (1000 UT) CAPS Take 1 capsule by mouth 2 (two) times daily.     escitalopram (LEXAPRO) 5 MG tablet Take 1 tablet (5 mg total) by mouth daily. 90 tablet 1   Glucosamine HCl-MSM (MSM GLUCOSAMINE PO) Take 2 tablets by mouth daily.     hydrocortisone cream 1 % Apply to affected area 2 times daily 15 g 0   MAGNESIUM PO Take 1 capsule by mouth daily.     Multiple Vitamins-Minerals (MULTIVITAMIN ADULTS 50+) TABS Take 1 tablet by mouth daily.     NONFORMULARY OR COMPOUNDED ITEM  compression socks 20-30 mm/ hg 1 each 0   Omega-3 Fatty Acids (FISH OIL PO) Take 1 capsule by mouth daily.     triamcinolone (NASACORT) 55 MCG/ACT AERO nasal inhaler Place 2 sprays into the nose daily as needed (allergies).      triamcinolone cream (KENALOG) 0.1 % Apply 1 application topically 2 (two) times daily as needed (eczema.).      Ubiquinol 100 MG CAPS Take 1 capsule by mouth 2 (two) times daily.     UNABLE TO FIND Take 1 tablet by mouth 3 (three) times daily. Med Name: bone strength- plant based calcium     predniSONE (DELTASONE) 10 MG tablet 4 tab po day 1 and day 2. 3 tab po day 3 and day 4 2 tab po day 5 and day 6.  1 tab po day 7 and day 8 (Patient not taking: Reported on 05/06/2022) 20 tablet 0   No facility-administered medications prior to visit.    Allergies  Allergen Reactions   Macrobid  [Nitrofurantoin Macrocrystal] Other (See Comments)   Doxycycline Hives   Pravastatin Sodium     REACTION: shin pain   Amoxicillin-Pot Clavulanate     GI symptoms; abdominal pain & nausea NOTE: able to take Ampicillin   Augmentin [Amoxicillin-Pot Clavulanate] Nausea Only    Upsets stomach   Codeine Nausea Only   Methylphenidate Hcl Palpitations   Methylprednisolone Rash    Rash and redness on face   Sulfamethoxazole-Trimethoprim Nausea Only     nausea    Review  of Systems  Constitutional:  Negative for fever and malaise/fatigue.  HENT:  Negative for congestion.   Eyes:  Negative for blurred vision.  Respiratory:  Negative for shortness of breath.   Cardiovascular:  Negative for chest pain, palpitations and leg swelling.  Gastrointestinal:  Negative for abdominal pain, blood in stool and nausea.  Genitourinary:  Negative for dysuria and frequency.  Musculoskeletal:  Negative for falls.  Skin:  Negative for rash.  Neurological:  Negative for dizziness, loss of consciousness and headaches.  Endo/Heme/Allergies:  Negative for environmental allergies.  Psychiatric/Behavioral:   Negative for depression. The patient is not nervous/anxious.        Objective:    Physical Exam Vitals and nursing note reviewed.  Constitutional:      General: She is not in acute distress.    Appearance: Normal appearance. She is not ill-appearing.  HENT:     Head: Normocephalic and atraumatic.     Right Ear: External ear normal.     Left Ear: External ear normal.  Eyes:     Extraocular Movements: Extraocular movements intact.     Pupils: Pupils are equal, round, and reactive to light.  Neck:     Vascular: No carotid bruit.  Cardiovascular:     Rate and Rhythm: Normal rate and regular rhythm.     Heart sounds: Normal heart sounds. No murmur heard.    No gallop.  Pulmonary:     Effort: Pulmonary effort is normal. No respiratory distress.     Breath sounds: Normal breath sounds. No wheezing or rales.  Skin:    General: Skin is warm and dry.  Neurological:     Mental Status: She is alert and oriented to person, place, and time.  Psychiatric:        Judgment: Judgment normal.     BP 136/80 (BP Location: Left Arm, Patient Position: Sitting, Cuff Size: Normal)   Pulse 65   Temp 97.6 F (36.4 C) (Oral)   Resp 18   Ht 5' 3.5" (1.613 m)   Wt 138 lb 6.4 oz (62.8 kg)   SpO2 96%   BMI 24.13 kg/m  Wt Readings from Last 3 Encounters:  05/06/22 138 lb 6.4 oz (62.8 kg)  04/29/22 140 lb (63.5 kg)  03/16/22 137 lb (62.1 kg)       Assessment & Plan:  Chest pain, unspecified type Assessment & Plan: Off and on for few days ----- none since er visit but pt is planning to have a knee replacement  Refer to cardiology   Orders: -     Ambulatory referral to Cardiology -     ECHOCARDIOGRAM COMPLETE; Future  Hyperlipidemia, unspecified hyperlipidemia type Assessment & Plan: Encourage heart healthy diet such as MIND or DASH diet, increase exercise, avoid trans fats, simple carbohydrates and processed foods, consider a krill or fish or flaxseed oil cap daily.    Orders: -      CBC with Differential/Platelet -     Comprehensive metabolic panel -     Lipid panel    I, Ann Held, DO, personally preformed the services described in this documentation.  All medical record entries made by the scribe were at my direction and in my presence.  I have reviewed the chart and discharge instructions (if applicable) and agree that the record reflects my personal performance and is accurate and complete. 05/06/2022   I,[name],acting as a scribe for Ann Held, DO.,have documented all relevant documentation on the behalf  of Ann Held, DO,as directed by  Ann Held, DO while in the presence of Ann Held, DO.   Ann Held, DO

## 2022-05-06 NOTE — Patient Instructions (Signed)
Chest Wall Pain Chest wall pain is pain in or around the bones and muscles of your chest. Sometimes, an injury causes this pain. Excessive coughing or overuse of arm and chest muscles may also cause chest wall pain. Sometimes, the cause may not be known. This pain may take several weeks or longer to get better. Follow these instructions at home: Managing pain, stiffness, and swelling  If directed, put ice on the painful area: Put ice in a plastic bag. Place a towel between your skin and the bag. Leave the ice on for 20 minutes, 2-3 times per day. Activity Rest as told by your health care provider. Avoid activities that cause pain. These include any activities that use your chest muscles or your abdominal and side muscles to lift heavy items. Ask your health care provider what activities are safe for you. General instructions  Take over-the-counter and prescription medicines only as told by your health care provider. Do not use any products that contain nicotine or tobacco, such as cigarettes, e-cigarettes, and chewing tobacco. These can delay healing after injury. If you need help quitting, ask your health care provider. Keep all follow-up visits as told by your health care provider. This is important. Contact a health care provider if: You have a fever. Your chest pain becomes worse. You have new symptoms. Get help right away if: You have nausea or vomiting. You feel sweaty or light-headed. You have a cough with mucus from your lungs (sputum) or you cough up blood. You develop shortness of breath. These symptoms may represent a serious problem that is an emergency. Do not wait to see if the symptoms will go away. Get medical help right away. Call your local emergency services (911 in the [place] Do not drive yourself to the hospital. Summary Chest wall pain is pain in or around the bones and muscles of your chest. Depending on the cause, it may be treated with ice, rest, medicines, and  avoiding activities that cause pain. Contact a health care provider if you have a fever, worsening chest pain, or new symptoms. Get help right away if you feel light-headed or you develop shortness of breath. These symptoms may be an emergency. This information is not intended to replace advice given to you by your health care provider. Make sure you discuss any questions you have with your health care provider. Document Revised: 06/27/2020 Document Reviewed: 06/27/2020 Elsevier Patient Education  2023 Elsevier Inc.  

## 2022-05-06 NOTE — Telephone Encounter (Signed)
Pt here for visit today

## 2022-05-10 ENCOUNTER — Ambulatory Visit: Payer: Medicare PPO | Admitting: Family Medicine

## 2022-05-11 DIAGNOSIS — N950 Postmenopausal bleeding: Secondary | ICD-10-CM | POA: Diagnosis not present

## 2022-05-13 DIAGNOSIS — M1711 Unilateral primary osteoarthritis, right knee: Secondary | ICD-10-CM | POA: Diagnosis not present

## 2022-05-19 ENCOUNTER — Encounter: Payer: Self-pay | Admitting: Cardiovascular Disease

## 2022-05-19 ENCOUNTER — Ambulatory Visit: Payer: Medicare PPO | Attending: Cardiovascular Disease | Admitting: Cardiovascular Disease

## 2022-05-19 VITALS — BP 118/64 | HR 74 | Ht 63.0 in | Wt 143.0 lb

## 2022-05-19 DIAGNOSIS — E782 Mixed hyperlipidemia: Secondary | ICD-10-CM

## 2022-05-19 DIAGNOSIS — R0789 Other chest pain: Secondary | ICD-10-CM

## 2022-05-19 DIAGNOSIS — R072 Precordial pain: Secondary | ICD-10-CM

## 2022-05-19 MED ORDER — METOPROLOL TARTRATE 100 MG PO TABS: 100.0000 mg | ORAL_TABLET | Freq: Once | ORAL | 0 refills | Status: DC

## 2022-05-19 MED ORDER — METOPROLOL TARTRATE 50 MG PO TABS: 50.0000 mg | ORAL_TABLET | Freq: Once | ORAL | 0 refills | Status: DC

## 2022-05-19 NOTE — Patient Instructions (Addendum)
Medication Instructions:  Your physician recommends that you continue on your current medications as directed. Please refer to the Current Medication list given to you today.  *If you need a refill on your cardiac medications before your next appointment, please call your pharmacy*   Lab Work: Your physician recommends that you have labs drawn today: BMET  If you have labs (blood work) drawn today and your tests are completely normal, you will receive your results only by: [facility] (if you have MyChart) OR A paper copy in the mail If you have any lab test that is abnormal or we need to change your treatment, we will call you to review the results.   Testing/Procedures: See below   Follow-Up: At [facility], you and your health needs are our priority.  As part of our continuing mission to provide you with exceptional heart care, we have created designated Provider Care Teams.  These Care Teams include your primary Cardiologist (physician) and Advanced Practice Providers (APPs -  Physician Assistants and Nurse Practitioners) who all work together to provide you with the care you need, when you need it.  We recommend signing up for the patient portal called "MyChart".  Sign up information is provided on this After Visit Summary.  MyChart is used to connect with patients for Virtual Visits (Telemedicine).  Patients are able to view lab/test results, encounter notes, upcoming appointments, etc.  Non-urgent messages can be sent to your provider as well.   To learn more about what you can do with MyChart, go to [email or web address].    Your next appointment:   We will see you on an as needed basis.  Provider:   Quay Burow, MD    Other Instructions   Your cardiac CT will be scheduled at the below location:   [facility] [address] [place], [place] [address] [phone]   If scheduled at [facility], please arrive at the [facility] and  Children's Entrance (Entrance C2) of [facility] 30 minutes prior to test start time. You can use the FREE valet parking offered at entrance C (encouraged to control the heart rate for the test)  Proceed to the [facility] Radiology Department (first floor) to check-in and test prep.  All radiology patients and guests should use entrance C2 at [facility], accessed from [facility], even though the hospital's physical address listed is [address]     Please follow these instructions carefully (unless otherwise directed):   On the Night Before the Test: Be sure to Drink plenty of water. Do not consume any caffeinated/decaffeinated beverages or chocolate 12 hours prior to your test. Do not take any antihistamines 12 hours prior to your test.   On the Day of the Test: Drink plenty of water until 1 hour prior to the test. Do not eat any food 1 hour prior to test. You may take your regular medications prior to the test.  Take metoprolol (Lopressor) '100mg'$  two hours prior to test. HOLD Furosemide/Hydrochlorothiazide morning of the test. FEMALES- please wear underwire-free bra if available, avoid dresses & tight clothing       After the Test: Drink plenty of water. After receiving IV contrast, you may experience a mild flushed feeling. This is normal. On occasion, you may experience a mild rash up to 24 hours after the test. This is not dangerous. If this occurs, you can take Benadryl 25 mg and increase your fluid intake. If you  experience trouble breathing, this can be serious. If it is severe call 911 IMMEDIATELY. If it is mild, please call our office. If you take any of these medications: Glipizide/Metformin, Avandament, Glucavance, please do not take 48 hours after completing test unless otherwise instructed.  We will call to schedule your test 2-4 weeks out understanding that some insurance companies will need an authorization prior to the  service being performed.   For non-scheduling related questions, please contact the cardiac imaging nurse navigator should you have any questions/concerns: Marchia Bond, Cardiac Imaging Nurse Navigator Gordy Clement, Cardiac Imaging Nurse Navigator [facility] Direct Office Dial: [phone]   For scheduling needs, including cancellations and rescheduling, please call [place], [phone].

## 2022-05-19 NOTE — Assessment & Plan Note (Signed)
History of hyperlipidemia on statin therapy with lipid profile performed 05/06/2022 revealing total cholesterol 166, LDL 60 and HDL of 92.

## 2022-05-19 NOTE — Assessment & Plan Note (Signed)
Ms. Proctor was referred by Dr. Carollee Herter for evaluation of atypical chest pain.  Her cardiac risk factors notable for treated hyperlipidemia and family history.  She had 2 episodes of chest pain approximately 2 weeks ago.  She has had none prior to that nor since.  She is being scheduled for elective knee replacement.  She has a 2D echo already scheduled by her primary care provider.  I am going to get a coronary CTA to rule stratify and further evaluate.

## 2022-05-19 NOTE — Progress Notes (Signed)
05/19/2022 Kim Mclaughlin   [birth year]  [id number]  Primary Physician Ann Held, DO Primary Cardiologist: Lorretta Harp MD Lupe Car[place], [place]  HPI:  Kim Mclaughlin is a 77 y.o. thin-appearing married Caucasian female mother of 59, grandmother of 8 grandchildren referred by her PCP, Dr. Carollee Herter, for evaluation of atypical chest pain.  She is a retired second and [employer].  Her only cardiac risk factor is treated hyperlipidemia.  Her father did have an MI at age 38 which she survived.  She is never had a heart attack or stroke.  She apparently scheduled for elective knee replacement in the near future by Dr. Maureen Ralphs.  She has had 2 episodes of chest pain in the last 2 weeks 1 of which occurred in the early morning hours when she will lie back in bed and the other while she was in a doctor's office under a lot of stress.  The symptoms were brief localized to her chest with radiation to her back.  She has had no recurrent symptoms.  She was seen in the ER and evaluated on 04/29/22 with an unremarkable workup.   Current Meds  Medication Sig   Ascorbic Acid (VITA-C PO) Take by mouth.   aspirin EC 81 MG tablet Take 1 tablet (81 mg total) by mouth daily. Swallow whole.   atorvastatin (LIPITOR) 40 MG tablet Take 1 tablet (40 mg total) by mouth daily.   Boswellia-Glucosamine-Vit D (OSTEO BI-FLEX ONE PER DAY PO) Take 1 tablet by mouth 2 (two) times daily.   cetirizine (ZYRTEC) 10 MG tablet Take 10 mg by mouth daily as needed for allergies.   Cholecalciferol (VITAMIN D-3) 25 MCG (1000 UT) CAPS Take 1 capsule by mouth 2 (two) times daily.   Glucosamine HCl-MSM (MSM GLUCOSAMINE PO) Take 2 tablets by mouth daily.   hydrocortisone cream 1 % Apply to affected area 2 times daily   MAGNESIUM PO Take 1 capsule by mouth daily.   Multiple Vitamins-Minerals (MULTIVITAMIN ADULTS 50+) TABS Take 1 tablet by mouth daily.   NONFORMULARY OR COMPOUNDED ITEM compression socks  20-30 mm/ hg   Omega-3 Fatty Acids (FISH OIL PO) Take 1 capsule by mouth daily.   predniSONE (DELTASONE) 10 MG tablet 4 tab po day 1 and day 2. 3 tab po day 3 and day 4 2 tab po day 5 and day 6.  1 tab po day 7 and day 8   triamcinolone (NASACORT) 55 MCG/ACT AERO nasal inhaler Place 2 sprays into the nose daily as needed (allergies).    triamcinolone cream (KENALOG) 0.1 % Apply 1 application topically 2 (two) times daily as needed (eczema.).    Ubiquinol 100 MG CAPS Take 1 capsule by mouth 2 (two) times daily.   UNABLE TO FIND Take 1 tablet by mouth 3 (three) times daily. Med Name: bone strength- plant based calcium     Allergies  Allergen Reactions   Macrobid  [Nitrofurantoin Macrocrystal] Other (See Comments)   Doxycycline Hives   Pravastatin Sodium     REACTION: shin pain   Amoxicillin-Pot Clavulanate     GI symptoms; abdominal pain & nausea NOTE: able to take Ampicillin   Augmentin [Amoxicillin-Pot Clavulanate] Nausea Only    Upsets stomach   Codeine Nausea Only   Methylphenidate Hcl Palpitations   Methylprednisolone Rash    Rash and redness on face   Sulfamethoxazole-Trimethoprim Nausea Only     nausea    Social History  Socioeconomic History   Marital status: Married    Spouse name: Kim Mclaughlin   Number of children: Not on file   Years of education: Not on file   Highest education level: Not on file  Occupational History   Occupation: retired  Tobacco Use   Smoking status: Former    Types: Cigarettes    Quit date: 04/27/1963    Years since quitting: 59.1   Smokeless tobacco: Never   Tobacco comments:    Smoked < 6 mos;< 1 pack total;quit 1965  Vaping Use   Vaping Use: Never used  Substance and Sexual Activity   Alcohol use: No   Drug use: No   Sexual activity: Yes    Birth control/protection: Post-menopausal  Other Topics Concern   Not on file  Social History Narrative   Lives with husband   Right Handed   Drinks average 2-3 cups caffeine daily   Social  Determinants of Health   Financial Resource Strain: Low Risk  (02/25/2021)   Overall Financial Resource Strain (CARDIA)    Difficulty of Paying Living Expenses: Not hard at all  Food Insecurity: No Food Insecurity (02/25/2021)   Hunger Vital Sign    Worried About Running Out of Food in the Last Year: Never true    [place] in the Last Year: Never true  Transportation Needs: No Transportation Needs (02/25/2021)   PRAPARE - Hydrologist (Medical): No    Lack of Transportation (Non-Medical): No  Physical Activity: Inactive (02/25/2021)   Exercise Vital Sign    Days of Exercise per Week: 0 days    Minutes of Exercise per Session: 0 min  Stress: No Stress Concern Present (02/25/2021)   [place]    Feeling of Stress : Not at all  Social Connections: Moderately Integrated (02/25/2021)   Social Connection and Isolation Panel [NHANES]    Frequency of Communication with Friends and Family: More than three times a week    Frequency of Social Gatherings with Friends and Family: More than three times a week    Attends Religious Services: More than 4 times per year    Active Member of [employer] or Organizations: No    Attends Archivist Meetings: Never    Marital Status: Married  Human resources officer Violence: Not At Risk (02/25/2021)   Humiliation, Afraid, Rape, and Kick questionnaire    Fear of Current or Ex-Partner: No    Emotionally Abused: No    Physically Abused: No    Sexually Abused: No     Review of Systems: General: negative for chills, fever, night sweats or weight changes.  Cardiovascular: negative for chest pain, dyspnea on exertion, edema, orthopnea, palpitations, paroxysmal nocturnal dyspnea or shortness of breath Dermatological: negative for rash Respiratory: negative for cough or wheezing Urologic: negative for hematuria Abdominal: negative for nausea, vomiting, diarrhea,  bright red blood per rectum, melena, or hematemesis Neurologic: negative for visual changes, syncope, or dizziness All other systems reviewed and are otherwise negative except as noted above.    Blood pressure 118/64, pulse 74, height '5\' 3"'$  (1.6 m), weight 143 lb (64.9 kg).  General appearance: alert and no distress Neck: no adenopathy, no carotid bruit, no JVD, supple, symmetrical, trachea midline, and thyroid not enlarged, symmetric, no tenderness/mass/nodules Lungs: clear to auscultation bilaterally Heart: regular rate and rhythm, S1, S2 normal, no murmur, click, rub or gallop Extremities: extremities normal, atraumatic, no cyanosis or edema Pulses:  2+ and symmetric Skin: Skin color, texture, turgor normal. No rashes or lesions Neurologic: Grossly normal  EKG sinus rhythm at 74 without ST or T wave changes.  Personally reviewed this EKG.  ASSESSMENT AND PLAN:   Chest pain Ms. Amble was referred by Dr. Carollee Herter for evaluation of atypical chest pain.  Her cardiac risk factors notable for treated hyperlipidemia and family history.  She had 2 episodes of chest pain approximately 2 weeks ago.  She has had none prior to that nor since.  She is being scheduled for elective knee replacement.  She has a 2D echo already scheduled by her primary care provider.  I am going to get a coronary CTA to rule stratify and further evaluate.  Hyperlipidemia History of hyperlipidemia on statin therapy with lipid profile performed 05/06/2022 revealing total cholesterol 166, LDL 60 and HDL of 92.     Lorretta Harp MD [facility], [facility] 05/19/2022 11:37 AM

## 2022-05-20 ENCOUNTER — Encounter: Payer: Self-pay | Admitting: Family Medicine

## 2022-05-20 LAB — BASIC METABOLIC PANEL
BUN/Creatinine Ratio: 18 (ref 12–28)
BUN: 15 mg/dL (ref 8–27)
CO2: 25 mmol/L (ref 20–29)
Calcium: 10.1 mg/dL (ref 8.7–10.3)
Chloride: 107 mmol/L — ABNORMAL HIGH (ref 96–106)
Creatinine, Ser: 0.83 mg/dL (ref 0.57–1.00)
Glucose: 95 mg/dL (ref 70–99)
Potassium: 5 mmol/L (ref 3.5–5.2)
Sodium: 149 mmol/L — ABNORMAL HIGH (ref 134–144)
eGFR: 73 mL/min/{1.73_m2} (ref >59)

## 2022-05-21 DIAGNOSIS — M1711 Unilateral primary osteoarthritis, right knee: Secondary | ICD-10-CM | POA: Diagnosis not present

## 2022-05-24 ENCOUNTER — Telehealth (HOSPITAL_COMMUNITY): Payer: Self-pay | Admitting: *Deleted

## 2022-05-24 ENCOUNTER — Telehealth: Payer: Self-pay | Admitting: Cardiovascular Disease

## 2022-05-24 NOTE — Telephone Encounter (Signed)
Spoke with pt regarding questions she has about her coronary CTA, pt had a gel injection on Friday, 1/26 and wants to make sure this will have no impact on this procedure. Pt would also like to clarify when she is to take the lopressor. Pt's procedure is at 2:30p, she will plan to take lopressor at 12:30pm. Pt mentions a part of instructions that says for pt to hold lasix or medications with HCTZ. Per pt's medication list, she is not on any diuretics. Pt verbalizes understanding.

## 2022-05-24 NOTE — Telephone Encounter (Signed)
Reaching out to patient to offer assistance regarding upcoming cardiac imaging study; pt verbalizes understanding of appt date/time, parking situation and where to check in, pre-test NPO status and medications ordered, and verified current allergies; name and call back number provided for further questions should they arise  Gordy Clement RN Navigator Cardiac Imaging [facility] [phone] office [phone] cell  Patient to take '100mg'$  metoprolol tartrate two hours prior to her cardiac CT scan. She is aware to arrive at [facility].

## 2022-05-24 NOTE — Telephone Encounter (Signed)
Pt would like a callback regarding upcoming procedure. She has questions. Please advise

## 2022-05-25 ENCOUNTER — Ambulatory Visit (HOSPITAL_COMMUNITY)
Admission: RE | Admit: 2022-05-25 | Discharge: 2022-05-25 | Disposition: A | Discharge status: Home / Self Care | Payer: Medicare PPO | Source: Ambulatory Visit | Attending: Cardiovascular Disease | Admitting: Cardiovascular Disease

## 2022-05-25 DIAGNOSIS — R072 Precordial pain: Secondary | ICD-10-CM | POA: Diagnosis not present

## 2022-05-25 DIAGNOSIS — I700 Atherosclerosis of aorta: Secondary | ICD-10-CM

## 2022-05-25 MED ORDER — IOHEXOL 350 MG/ML SOLN
100.0000 mL | Freq: Once | INTRAVENOUS | Status: AC | PRN
  Administered 2022-05-25: 100 mL via INTRAVENOUS

## 2022-05-25 MED ORDER — NITROGLYCERIN 0.4 MG SL SUBL
0.8000 mg | SUBLINGUAL_TABLET | Freq: Once | SUBLINGUAL | Status: AC
  Administered 2022-05-25: 0.8 mg via SUBLINGUAL

## 2022-05-25 MED ORDER — NITROGLYCERIN 0.4 MG SL SUBL
SUBLINGUAL_TABLET | SUBLINGUAL | Status: AC
  Filled 2022-05-25: qty 2

## 2022-06-03 ENCOUNTER — Encounter: Payer: Self-pay | Admitting: Podiatry

## 2022-06-03 ENCOUNTER — Ambulatory Visit (INDEPENDENT_AMBULATORY_CARE_PROVIDER_SITE_OTHER): Payer: Medicare PPO | Admitting: Podiatry

## 2022-06-03 ENCOUNTER — Ambulatory Visit (HOSPITAL_COMMUNITY): Payer: Medicare PPO | Attending: Cardiovascular Disease

## 2022-06-03 DIAGNOSIS — L84 Corns and callosities: Secondary | ICD-10-CM

## 2022-06-03 DIAGNOSIS — R079 Chest pain, unspecified: Secondary | ICD-10-CM | POA: Insufficient documentation

## 2022-06-03 DIAGNOSIS — M7752 Other enthesopathy of left foot: Secondary | ICD-10-CM | POA: Diagnosis not present

## 2022-06-03 DIAGNOSIS — M2042 Other hammer toe(s) (acquired), left foot: Secondary | ICD-10-CM

## 2022-06-03 LAB — ECHOCARDIOGRAM COMPLETE
Area-P 1/2: 3.75 cm2
S' Lateral: 2.5 cm

## 2022-06-03 MED ORDER — TRIAMCINOLONE ACETONIDE 10 MG/ML IJ SUSP
10.0000 mg | Freq: Once | INTRAMUSCULAR | Status: AC
  Administered 2022-06-03: 10 mg

## 2022-06-04 DIAGNOSIS — M1711 Unilateral primary osteoarthritis, right knee: Secondary | ICD-10-CM | POA: Diagnosis not present

## 2022-06-04 NOTE — Progress Notes (Signed)
Subjective:   Patient ID: Kim Mclaughlin, female   DOB: 77 y.o.   MRN: [id number]   HPI Patient presents with quite a bit of inflammation of the second digit left foot with fluid buildup around it and also has a lesion x 3 left and has digital deformities of the second and third toes left foot which can become bothersome   ROS      Objective:  Physical Exam  Neurovascular status intact inflammation pain inner phalangeal joint digit to left with fluid buildup noted and keratotic lesion formation and digital deformities digits 2 3 left which become painful and at times make it hard to wear shoes     Assessment:  Inflammatory capsulitis second digit left foot inner phalangeal joint with lesion formation of the lesser digits with keratotic tissue formation and structural deformity left     Plan:  H&P reviewed condition and at this point sterile prep and injected the inner phalangeal joint digit to left 2 mg dexamethasone Kenalog 5 mg Xylocaine did courtesy debridement of lesion discussed hammertoes and at one point may require surgical intervention educating her on arthroplasty procedure

## 2022-06-30 ENCOUNTER — Ambulatory Visit (INDEPENDENT_AMBULATORY_CARE_PROVIDER_SITE_OTHER): Payer: Medicare PPO | Admitting: Neurology

## 2022-06-30 ENCOUNTER — Encounter: Payer: Self-pay | Admitting: Neurology

## 2022-06-30 VITALS — BP 135/81 | HR 76 | Ht 63.0 in | Wt 142.5 lb

## 2022-06-30 DIAGNOSIS — R413 Other amnesia: Secondary | ICD-10-CM

## 2022-06-30 DIAGNOSIS — G454 Transient global amnesia: Secondary | ICD-10-CM | POA: Diagnosis not present

## 2022-06-30 NOTE — Progress Notes (Signed)
[name] [address] [place]. [place] [address] [phone]       OFFICE FOLLOW UP VISIT NOTE  [name] Date of Birth:  1946/04/07 Medical Record Number:  [id number]   Referring MD: [name]  Reason for Referral: Memory loss HPI: Initial visit 07/02/2020 :[name] is a pleasant 77 Caucasian lady seen today for initial office consultation visit for episode of memory loss.  History is obtained from the patient and her husband as well as review of electronic medical records and I personally reviewed pertinent imaging films in PACS.  She has past medical history for hyperlipidemia, gastroesophageal reflux disease and DVT.  She presented on 05/01/2020 with an episode of sudden onset of confusion and short-term memory difficulties.  She woke up that day from his usual state of health and drove to her appointment.  After that she apparently went [place] to run some errands but around 2 PM she got a phone call from her sister who noted that the patient appeared to be confused she could not remember that her older sister was sick.  She also said that she was wandering around and could not find her car.  Patient's husband subsequently came there and noticed that the patient had poor short-term memory and kept on asking the same questions over again.  She did report a mild headache.  She was taken to hospital with she still does not remember the details of the work-up.  CT scan was unremarkable and CT angiogram showed 2 to 3 mm dilatation of supraclinoid ICAs bilaterally possible infundibulum was a small aneurysm.  MRI scan was negative for any acute abnormalities.  EEG was obtained which was normal without any seizure activity.  Patient's condition improved after she was hospitalized in she started remembering new information since hospitalization but still could not remember for 2 hours.  That afternoon.  Patient states she has done well since  then.  She said no problems making new memories after discharge but still has very patchy memory of that afternoon to our.  She has no prior history of seizures, significant head injury, loss of consciousness, strokes, TIA.  She denies being under significant stress for this.  She did see Dr. Marcello Moores neurosurgeon as an outpatient who recommended conservative follow-up for her 2 to 3 mm supraclinoid Immuzim versus infundibulum.  I do not have those records for my review today. Update 10/22/2020 : She returns for follow-up after last visit 3 months ago.  She is accompanied by her husband.  She states she is doing well and has not had any further episodes of memory loss or confusion.  She had EEG done on 07/31/2020 which was normal.  She remains on aspirin is tolerating well without bruising or bleeding and Lipitor which she is tolerating without muscle aches and pains.  Primary care physician recently added co-Q10 to reduce side effects.  Patient is fully independent in activities of daily living.  She has had no new health concerns.  She has no neurological complaints. Update 12/30/2021 ; she returns for follow-up today after last visit with me in June 2022.  She is accompanied by her husband.  Another recurrent episode of transient global amnesia in 23.  09/30/2021 he had a hairdressers appointment where she was noted to be confused and disoriented and had no memory of how she got there.  She had spoken to her sister but did not remember that.  Her husband states that  she has forgotten some of the things that she has done over the past couple of days as well.  Her husband received a call from the hairdresser saying that she was confused, and when he went and found her, she did not remember multiple events from this morning.  She has since been repeating the same questions over and over again, he estimates 15-20 times that she has asked him why she is in the hospital.  Dr. Leonel Ramsay introduced herself to the patient and  spoke to her briefly stepped out of the room to answer the page return patient has no memory of having met him.  CT scan of the head was obtained which showed no acute abnormality.  EEG was normal without epileptiform activity.  Lab work obtained included comprehensive metabolic panel, CBC, urinalysis, vitamin B12 and TSH were all normal.  LDL cholesterol was optimal at 43 mg percent.  Patient was discharged and advised to follow-up with me.  She states she still cannot remember any events from that morning a couple of hours.  The last thing she remembers is going to hairdresser but cannot recall what happened the or how she went to the hospital.  She remembers later event of her hospitalization still has no memory for about 1 hour time this episode.  This episode is quite similar to the one she had an January 2022 but the current episode was longer than the previous one.  Patient denies any history of migraine headaches but does admit to having significant gas in her life due to marital discord.  Patient does of Lipitor was increased to 80 mg by primary care physician but the question is questioning if she needs such a high dose .she has no prior history of progressive memory or cognitive difficulties.  Independent in all activities of daily living.  She denies any headaches other neurological complaints.  There is no family history of Alzheimer's, memory loss. Update 06/30/2022 : She returns for follow-up after last visit 6 months ago.  She is accompanied by her husband.  She continues to do well.  She has had no recurrent episodes of transient global amnesia, memory loss, stroke TIA or any other neurological complaints.  Remains fully independent in all activities of daily.  He had EEG done on 06/03/2022 which was normal.  MRI scan of the brain on 01/13/2022 was also unremarkable.  MR angiogram of the brain did not show definite aneurysm shows mild sclerotic changes in the left vertebral V4 segment.  And distal  posterior cerebral arteries he has no new complaints today.  He did quite fine on cognitive testing today. ROS:   14 system review of systems is positive for memory loss, confusion, disorientation and all other systems negative  PMH:  Past Medical History:  Diagnosis Date   Allergy    Arthritis 07/2014   Right hand, prescribed prednisone-no longer taking. also Rt knww   Brain aneurysm    DVT (deep venous thrombosis) ([place]) 2015   very small; treated with compression and heat, no other problems since   GERD (gastroesophageal reflux disease)    patient denies states she does have hx of peptic ulcer many years   Hepatitis    in 10 th grade  (not sure type- not hep C) - treated  no other problems   Hyperlipidemia    diet controlled   Memory loss    MVP (mitral valve prolapse)    no problems, MD told patient she could not hear  it   OSA (obstructive sleep apnea) 10/08/2021   Osteoarthritis    Osteopenia    [facility]   PONV (postoperative nausea and vomiting)    with ear surgery only - No other problems with other surgeries   Precancerous lesion 12/2014   Treated with cream only   TGA (transient global amnesia)    Varicose veins    Vitamin D deficiency     Social History:  Social History   Socioeconomic History   Marital status: Married    Spouse name: [name]   Number of children: Not on file   Years of education: Not on file   Highest education level: Not on file  Occupational History   Occupation: retired  Tobacco Use   Smoking status: Former    Types: Cigarettes    Quit date: 04/27/1963    Years since quitting: 59.2   Smokeless tobacco: Never   Tobacco comments:    Smoked < 6 mos;< 1 pack total;quit 1965  Vaping Use   Vaping Use: Never used  Substance and Sexual Activity   Alcohol use: No   Drug use: No   Sexual activity: Yes    Birth control/protection: Post-menopausal  Other Topics Concern   Not on file  Social History Narrative   Lives with husband   Right  Handed   Drinks average 2-3 cups caffeine daily   Social Determinants of Health   Financial Resource Strain: Low Risk  (02/25/2021)   Overall Financial Resource Strain (CARDIA)    Difficulty of Paying Living Expenses: Not hard at all  Food Insecurity: No Food Insecurity (02/25/2021)   Hunger Vital Sign    Worried About Running Out of Food in the Last Year: Never true    Ran Out of Food in the Last Year: Never true  Transportation Needs: No Transportation Needs (02/25/2021)   PRAPARE - Hydrologist (Medical): No    Lack of Transportation (Non-Medical): No  Physical Activity: Inactive (02/25/2021)   Exercise Vital Sign    Days of Exercise per Week: 0 days    Minutes of Exercise per Session: 0 min  Stress: No Stress Concern Present (02/25/2021)   [place]    Feeling of Stress : Not at all  Social Connections: Moderately Integrated (02/25/2021)   Social Connection and Isolation Panel [NHANES]    Frequency of Communication with Friends and Family: More than three times a week    Frequency of Social Gatherings with Friends and Family: More than three times a week    Attends Religious Services: More than 4 times per year    Active Member of [employer] or Organizations: No    Attends Archivist Meetings: Never    Marital Status: Married  Human resources officer Violence: Not At Risk (02/25/2021)   Humiliation, Afraid, Rape, and Kick questionnaire    Fear of Current or Ex-Partner: No    Emotionally Abused: No    Physically Abused: No    Sexually Abused: No    Medications:   Current Outpatient Medications on File Prior to Visit  Medication Sig Dispense Refill   Ascorbic Acid (VITA-C PO) Take by mouth.     aspirin EC 81 MG tablet Take 1 tablet (81 mg total) by mouth daily. Swallow whole. 30 tablet 12   atorvastatin (LIPITOR) 40 MG tablet Take 1 tablet (40 mg total) by mouth daily. 90 tablet 3    Boswellia-Glucosamine-Vit D (OSTEO BI-FLEX ONE  PER DAY PO) Take 1 tablet by mouth 2 (two) times daily.     cetirizine (ZYRTEC) 10 MG tablet Take 10 mg by mouth daily as needed for allergies.     Cholecalciferol (VITAMIN D-3) 25 MCG (1000 UT) CAPS Take 1 capsule by mouth 2 (two) times daily.     Glucosamine HCl-MSM (MSM GLUCOSAMINE PO) Take 2 tablets by mouth daily.     hydrocortisone cream 1 % Apply to affected area 2 times daily 15 g 0   MAGNESIUM PO Take 1 capsule by mouth daily.     Multiple Vitamins-Minerals (MULTIVITAMIN ADULTS 50+) TABS Take 1 tablet by mouth daily.     NONFORMULARY OR COMPOUNDED ITEM compression socks 20-30 mm/ hg 1 each 0   Omega-3 Fatty Acids (FISH OIL PO) Take 1 capsule by mouth daily.     predniSONE (DELTASONE) 10 MG tablet 4 tab po day 1 and day 2. 3 tab po day 3 and day 4 2 tab po day 5 and day 6.  1 tab po day 7 and day 8 20 tablet 0   triamcinolone (NASACORT) 55 MCG/ACT AERO nasal inhaler Place 2 sprays into the nose daily as needed (allergies).      triamcinolone cream (KENALOG) 0.1 % Apply 1 application topically 2 (two) times daily as needed (eczema.).      Ubiquinol 100 MG CAPS Take 1 capsule by mouth 2 (two) times daily.     UNABLE TO FIND Take 1 tablet by mouth 3 (three) times daily. Med Name: bone strength- plant based calcium     metoprolol tartrate (LOPRESSOR) 100 MG tablet Take 1 tablet (100 mg total) by mouth once for 1 dose. Take 2 hours prior to procedure. 1 tablet 0   No current facility-administered medications on file prior to visit.    Allergies:   Allergies  Allergen Reactions   Macrobid  [Nitrofurantoin Macrocrystal] Other (See Comments)   Doxycycline Hives   Pravastatin Sodium     REACTION: shin pain   Amoxicillin-Pot Clavulanate     GI symptoms; abdominal pain & nausea NOTE: able to take Ampicillin   Augmentin [Amoxicillin-Pot Clavulanate] Nausea Only    Upsets stomach   Codeine Nausea Only   Methylphenidate Hcl Palpitations    Methylprednisolone Rash    Rash and redness on face   Sulfamethoxazole-Trimethoprim Nausea Only     nausea    Physical Exam General: Frail elderly Caucasian lady seated, in no evident distress Head: head normocephalic and atraumatic.   Neck: supple with no carotid or supraclavicular bruits Cardiovascular: regular rate and rhythm, no murmurs Musculoskeletal: no deformity Skin:  no rash/petichiae Vascular:  Normal pulses all extremities  Neurologic Exam Mental Status: Awake and fully alert. Oriented to place and time. Recent and remote memory intact. Attention span, concentration and fund of knowledge appropriate. Mood and affect appropriate.  Recall 3/3.  Able to name 15 animals that can walk on 4 legs.  Clock drawing 4/4. Anxious. Cranial Nerves: Fundoscopic exam not done. Pupils equal, briskly reactive to light. Extraocular movements full without nystagmus. Visual fields full to confrontation. Hearing intact. Facial sensation intact. Face, tongue, palate moves normally and symmetrically.  Motor: Normal bulk and tone. Normal strength in all tested extremity muscles. Sensory.: intact to touch , pinprick , position and vibratory sensation.  Coordination: Rapid alternating movements normal in all extremities. Finger-to-nose and heel-to-shin performed accurately bilaterally. Gait and Station: Arises from chair without difficulty. Stance is normal. Gait demonstrates normal stride length and balance .  Able to heel, toe and tandem walk with moderate difficulty.  Reflexes: 1+ and symmetric. Toes downgoing.       ASSESSMENT: 77 year old Caucasian lady with transient episode of memory loss and confusion likely due to transient global amnesia in January 2022 with now recurrent episodes again in June 2023.  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     PLAN: I had a long discussion with the patient and her husband  regarding her recurrent episode of memory loss which likely represents recurrent transient global amnesia which is rare but has been described in 5 to 10% of cases in different studies.  I reassured her that she is not at increased risk for developing progressive memory loss or dementia..  I reviewed results of brain MRI scan, MR angiogram EEG follow-up with stroke team is unremarkable.  He was advised to take aspirin 81 mg daily for stroke prevention and maintain aggressive risk factor modification with blood pressure goal below 140/90 with LDL cholesterol goal below 70 mg percent.  I encouraged her to continue participation in cognitively challenging activities like solving crossword puzzles, playing bridge and sudoku.Marland Kitchen  She will return for follow-up in the future in 1 year or call earlier if needed.Greater than 50% time during this 35-minute  visit was spent on counseling and coordination of care about her episode of memory loss and asymptomatic aneurysm versus infundibulum discussion and answering questions. Antony Contras, MD Note: This document was prepared with digital dictation and possible smart phrase technology. Any transcriptional errors that result from this process are unintentional.

## 2022-06-30 NOTE — Patient Instructions (Signed)
I had a long discussion with the patient and her husband regarding her recurrent episode of memory loss which likely represents recurrent transient global amnesia which is rare but has been described in 5 to 10% of cases in different studies.  I reassured her that she is not at increased risk for developing progressive memory loss or dementia..  I reviewed results of brain MRI scan, MR angiogram EEG follow-up with stroke team is unremarkable.  He was advised to take aspirin 81 mg daily for stroke prevention and maintain aggressive risk factor modification with blood pressure goal below 140/90 with LDL cholesterol goal below 70 mg percent.  I encouraged her to continue participation in cognitively challenging activities like solving crossword puzzles, playing bridge and sudoku.Marland Kitchen  She will return for follow-up in the future in 1 year or call earlier if needed.

## 2022-07-01 ENCOUNTER — Encounter: Payer: Self-pay | Admitting: Family Medicine

## 2022-07-01 DIAGNOSIS — M1711 Unilateral primary osteoarthritis, right knee: Secondary | ICD-10-CM | POA: Diagnosis not present

## 2022-07-02 NOTE — Telephone Encounter (Signed)
Fyi. I did fax the echo to cardio.

## 2022-07-05 ENCOUNTER — Encounter: Payer: Self-pay | Admitting: Family Medicine

## 2022-07-05 ENCOUNTER — Telehealth: Payer: Self-pay

## 2022-07-05 NOTE — Telephone Encounter (Signed)
   Pre-operative Risk Assessment    Patient Name: Kim Mclaughlin  DOB: 02-Oct-1945 MRN: [id number]      Request for Surgical Clearance    Procedure:   Right total knee arthroplasty  Date of Surgery:  Clearance 09/15/22                                 Surgeon:  Dr Gaynelle Arabian Surgeon's Group or Practice Name:  [facility] Phone number:  [phone] Fax number:  [phone] [name]   Type of Clearance Requested:   - Medical  - Pharmacy:  Hold Aspirin     Type of Anesthesia:   Choice   Additional requests/questions:   n/a  Toma Deiters   07/05/2022, 3:37 PM

## 2022-07-06 NOTE — Telephone Encounter (Signed)
   Patient Name: Kim Mclaughlin  DOB: 06-19-1945 MRN: [id number]  Primary Cardiologist: None  Chart reviewed as part of pre-operative protocol coverage. Given past medical history and time since last visit, based on ACC/AHA guidelines, Kim Mclaughlin is at acceptable risk for the planned procedure without further cardiovascular testing.  [name] completed a cardiac CTA per Dr. Gwenlyn Found that was normal.  She also had a echocardiogram completed by her PCP that was also normal.  The patient was advised that if she develops new symptoms prior to surgery to contact our office to arrange for a follow-up visit, and she verbalized understanding.  Regarding ASA therapy, we recommend continuation of ASA throughout the perioperative period.  However, if the surgeon feels that cessation of ASA is required in the perioperative period, it may be stopped 5-7 days prior to surgery with a plan to resume it as soon as felt to be feasible from a surgical standpoint in the post-operative period.   I will route this recommendation to the requesting party via Epic fax function and remove from pre-op pool.  Please call with questions.  Mable Fill, Marissa Nestle, NP 07/06/2022, 8:35 AM

## 2022-07-08 ENCOUNTER — Ambulatory Visit (INDEPENDENT_AMBULATORY_CARE_PROVIDER_SITE_OTHER): Payer: Medicare PPO | Admitting: Family Medicine

## 2022-07-08 ENCOUNTER — Encounter: Payer: Self-pay | Admitting: Family Medicine

## 2022-07-08 VITALS — BP 134/78 | HR 76 | Temp 97.7°F | Resp 18 | Ht 63.0 in | Wt 142.4 lb

## 2022-07-08 DIAGNOSIS — M1711 Unilateral primary osteoarthritis, right knee: Secondary | ICD-10-CM

## 2022-07-08 DIAGNOSIS — E782 Mixed hyperlipidemia: Secondary | ICD-10-CM

## 2022-07-08 NOTE — Progress Notes (Signed)
Subjective:   By signing my name below, I, Marlana Latus, attest that this documentation has been prepared under the direction and in the presence of Ann Held, DO 07/08/22   Patient ID: Gardiner Coins, female    DOB: [birth year], 77 y.o.   MRN: [id number]  Chief Complaint  Patient presents with   Pre-op Exam    Right knee replacement, surgery is 09/15/22    HPI Patient is in today for surgical clearance.   She is scheduled for a right total knee arthroplasty with Dr. Wynelle Link on 09/15/2022. She reports having a complication with anesthesia around 1999. She has had other surgeries since then with no complications from the anesthesia.   She states PT will be teaching her home exercises before her surgery.   Past Medical History:  Diagnosis Date   Allergy    Arthritis 07/2014   Right hand, prescribed prednisone-no longer taking. also Rt knww   Brain aneurysm    DVT (deep venous thrombosis) ([place]) 2015   very small; treated with compression and heat, no other problems since   GERD (gastroesophageal reflux disease)    patient denies states she does have hx of peptic ulcer many years   Hepatitis    in 10 th grade  (not sure type- not hep C) - treated  no other problems   Hyperlipidemia    diet controlled   Memory loss    MVP (mitral valve prolapse)    no problems, MD told patient she could not hear it   OSA (obstructive sleep apnea) 10/08/2021   Osteoarthritis    Osteopenia    [facility]   PONV (postoperative nausea and vomiting)    with ear surgery only - No other problems with other surgeries   Precancerous lesion 12/2014   Treated with cream only   TGA (transient global amnesia)    Varicose veins    Vitamin D deficiency     Past Surgical History:  Procedure Laterality Date   APPENDECTOMY     BUNIONECTOMY WITH HAMMERTOE RECONSTRUCTION Left 04/22/2016   Procedure: MODIFIED MCBRIDE BUNIONECTOMY,  LEFT SECOND HAMMERTOE CORRECTION;  Surgeon: Wylene Simmer, MD;   Location: [facility];  Service: Orthopedics;  Laterality: Left;   COLONOSCOPY     07/2012 Olevia Perches - [facility]/father - polyps   colonoscopy with polypectomy  07/2012    hyperplastic;Dr Brodie   DILATION AND CURETTAGE OF UTERUS     DILATION AND CURETTAGE OF UTERUS N/A 09/03/2014   Procedure: DILATATION AND CURETTAGE;  Surgeon: Harle Battiest, MD;  Location: [facility];  Service: Gynecology;  Laterality: N/A;   ENDOVENOUS ABLATION SAPHENOUS VEIN W/ LASER Left 01-31-2014   EVLA  left greater saphenous vein  by Curt Jews MD   ENDOVENOUS ABLATION SAPHENOUS VEIN W/ LASER Right 03-14-2014   endovenous laser ablation right greater saphenous vein  by Curt Jews MD   FOOT ARTHRODESIS Left 04/22/2016   Procedure: LEFT FIRST TARSAL METATARSAL ARTHRODESIS;  Surgeon: Wylene Simmer, MD;  Location: [place];  Service: Orthopedics;  Laterality: Left;   G 3 P 3     HYSTEROSCOPY WITH D & C N/A 03/25/2016   Procedure: DILATATION AND CURETTAGE /HYSTEROSCOPY;  Surgeon: Jerelyn Charles, MD;  Location: [facility];  Service: Gynecology;  Laterality: N/A;  WITH ULTRASOUND GUIDANCE   left ear surgery     STAPEDECTOMY     TARSAL METATARSAL FUSION WITH WEIL OSTEOTOMY Left 04/22/2016   Procedure: 2-4 TARSAL METATARSAL FUSION  WITH WEIL OSTEOTOMY;  Surgeon: Wylene Simmer, MD;  Location: [place];  Service: Orthopedics;  Laterality: Left;   TUBAL LIGATION      Family History  Problem Relation Age of Onset   Pancreatic cancer Mother    Miscarriages / [place] Mother    Thyroid disease Mother        partial thyroidectomy ; S/P RAI   Heart attack Father        in early 58s   Colon cancer Father    Seizures Sister    Ovarian cancer Maternal Aunt    Breast cancer Paternal Aunt    Aneurysm Paternal Uncle        brain   Colon cancer Paternal Grandmother    Stroke Paternal Grandmother        > 27   Heart attack Paternal Grandfather        in 66s   Cancer Other         among paternal sibs (colon, breast, bone)   Diabetes Neg Hx    Stomach cancer Neg Hx    Ulcerative colitis Neg Hx    Esophageal cancer Neg Hx    Rectal cancer Neg Hx     Social History   Socioeconomic History   Marital status: Married    Spouse name: [name]   Number of children: Not on file   Years of education: Not on file   Highest education level: Not on file  Occupational History   Occupation: retired  Tobacco Use   Smoking status: Former    Types: Cigarettes    Quit date: 04/27/1963    Years since quitting: 59.2   Smokeless tobacco: Never   Tobacco comments:    Smoked < 6 mos;< 1 pack total;quit 1965  Vaping Use   Vaping Use: Never used  Substance and Sexual Activity   Alcohol use: No   Drug use: No   Sexual activity: Yes    Birth control/protection: Post-menopausal  Other Topics Concern   Not on file  Social History Narrative   Lives with husband   Right Handed   Drinks average 2-3 cups caffeine daily   Social Determinants of Health   Financial Resource Strain: Low Risk  (02/25/2021)   Overall Financial Resource Strain (CARDIA)    Difficulty of Paying Living Expenses: Not hard at all  Food Insecurity: No Food Insecurity (02/25/2021)   Hunger Vital Sign    Worried About Running Out of Food in the Last Year: Never true    Ran Out of Food in the Last Year: Never true  Transportation Needs: No Transportation Needs (02/25/2021)   PRAPARE - Hydrologist (Medical): No    Lack of Transportation (Non-Medical): No  Physical Activity: Inactive (02/25/2021)   Exercise Vital Sign    Days of Exercise per Week: 0 days    Minutes of Exercise per Session: 0 min  Stress: No Stress Concern Present (02/25/2021)   [place]    Feeling of Stress : Not at all  Social Connections: Moderately Integrated (02/25/2021)   Social Connection and Isolation Panel [NHANES]    Frequency of  Communication with Friends and Family: More than three times a week    Frequency of Social Gatherings with Friends and Family: More than three times a week    Attends Religious Services: More than 4 times per year    Active Member of Clubs or Organizations: No  Attends Archivist Meetings: Never    Marital Status: Married  Human resources officer Violence: Not At Risk (02/25/2021)   Humiliation, Afraid, Rape, and Kick questionnaire    Fear of Current or Ex-Partner: No    Emotionally Abused: No    Physically Abused: No    Sexually Abused: No    Outpatient Medications Prior to Visit  Medication Sig Dispense Refill   Ascorbic Acid (VITA-C PO) Take by mouth.     aspirin EC 81 MG tablet Take 1 tablet (81 mg total) by mouth daily. Swallow whole. 30 tablet 12   atorvastatin (LIPITOR) 40 MG tablet Take 1 tablet (40 mg total) by mouth daily. 90 tablet 3   Boswellia-Glucosamine-Vit D (OSTEO BI-FLEX ONE PER DAY PO) Take 1 tablet by mouth 2 (two) times daily.     cetirizine (ZYRTEC) 10 MG tablet Take 10 mg by mouth daily as needed for allergies.     Glucosamine HCl-MSM (MSM GLUCOSAMINE PO) Take 2 tablets by mouth daily.     hydrocortisone cream 1 % Apply to affected area 2 times daily 15 g 0   MAGNESIUM PO Take 1 capsule by mouth daily.     Multiple Vitamins-Minerals (MULTIVITAMIN ADULTS 50+) TABS Take 1 tablet by mouth daily.     Omega-3 Fatty Acids (FISH OIL PO) Take 1 capsule by mouth daily.     triamcinolone (NASACORT) 55 MCG/ACT AERO nasal inhaler Place 2 sprays into the nose daily as needed (allergies).      triamcinolone cream (KENALOG) 0.1 % Apply 1 application topically 2 (two) times daily as needed (eczema.).      UNABLE TO FIND Take 1 tablet by mouth 3 (three) times daily. Med Name: bone strength- plant based calcium     Cholecalciferol (VITAMIN D-3) 25 MCG (1000 UT) CAPS Take 1 capsule by mouth 2 (two) times daily.     metoprolol tartrate (LOPRESSOR) 100 MG tablet Take 1 tablet (100  mg total) by mouth once for 1 dose. Take 2 hours prior to procedure. 1 tablet 0   NONFORMULARY OR COMPOUNDED ITEM compression socks 20-30 mm/ hg 1 each 0   predniSONE (DELTASONE) 10 MG tablet 4 tab po day 1 and day 2. 3 tab po day 3 and day 4 2 tab po day 5 and day 6.  1 tab po day 7 and day 8 20 tablet 0   Ubiquinol 100 MG CAPS Take 1 capsule by mouth 2 (two) times daily.     No facility-administered medications prior to visit.    Allergies  Allergen Reactions   Macrobid  [Nitrofurantoin Macrocrystal] Other (See Comments)   Doxycycline Hives   Pravastatin Sodium     REACTION: shin pain   Amoxicillin-Pot Clavulanate     GI symptoms; abdominal pain & nausea NOTE: able to take Ampicillin   Augmentin [Amoxicillin-Pot Clavulanate] Nausea Only    Upsets stomach   Codeine Nausea Only   Methylphenidate Hcl Palpitations   Methylprednisolone Rash    Rash and redness on face   Sulfamethoxazole-Trimethoprim Nausea Only     nausea    Review of Systems  Constitutional:  Negative for fever and malaise/fatigue.  HENT:  Negative for congestion.   Eyes:  Negative for blurred vision.  Respiratory:  Negative for cough and shortness of breath.   Cardiovascular:  Negative for chest pain, palpitations and leg swelling.  Gastrointestinal:  Negative for abdominal pain, blood in stool, nausea and vomiting.  Genitourinary:  Negative for dysuria and frequency.  Musculoskeletal:  Negative for back pain, falls and joint pain (right knee).  Skin:  Negative for rash.  Neurological:  Negative for dizziness, loss of consciousness and headaches.  Endo/Heme/Allergies:  Negative for environmental allergies.  Psychiatric/Behavioral:  Negative for depression. The patient is not nervous/anxious.        Objective:    Physical Exam Vitals and nursing note reviewed.  Constitutional:      Appearance: She is well-developed.  HENT:     Head: Normocephalic and atraumatic.  Eyes:     Conjunctiva/sclera:  Conjunctivae normal.  Neck:     Thyroid: No thyromegaly.     Vascular: No carotid bruit or JVD.  Cardiovascular:     Rate and Rhythm: Normal rate and regular rhythm.     Heart sounds: Normal heart sounds. No murmur heard. Pulmonary:     Effort: Pulmonary effort is normal. No respiratory distress.     Breath sounds: Normal breath sounds. No wheezing or rales.  Chest:     Chest wall: No tenderness.  Musculoskeletal:        General: Tenderness present.     Cervical back: Normal range of motion and neck supple.     Right knee: Decreased range of motion. Tenderness present.     Left knee: Normal.  Neurological:     Mental Status: She is alert and oriented to person, place, and time.     BP 134/78 (BP Location: Left Arm, Patient Position: Sitting, Cuff Size: Normal)   Pulse 76   Temp 97.7 F (36.5 C) (Oral)   Resp 18   Ht '5\' 3"'$  (1.6 m)   Wt 142 lb 6.4 oz (64.6 kg)   SpO2 97%   BMI 25.23 kg/m  Wt Readings from Last 3 Encounters:  07/08/22 142 lb 6.4 oz (64.6 kg)  06/30/22 142 lb 8 oz (64.6 kg)  05/19/22 143 lb (64.9 kg)       Assessment & Plan:  Primary osteoarthritis of right knee Assessment & Plan: Pt cleared to have R knee replacement    Mixed hyperlipidemia Assessment & Plan: Cholesterol--- LDL goal < 70,  HDL >40,  TG < 150.  Diet and exercise will increase HDL and decrease LDL and TG.  Fish,  Fish Oil, Flaxseed oil will also help increase the HDL and decrease Triglycerides.    Lab Results  Component Value Date   CHOL 166 05/06/2022   HDL 92.20 05/06/2022   LDLCALC 60 05/06/2022   LDLDIRECT 188.0 08/08/2012   TRIG 70.0 05/06/2022   CHOLHDL 2 05/06/2022   .        I,Rachel Rivera,acting as a Education administrator for [employer], DO.,have documented all relevant documentation on the behalf of Ann Held, DO,as directed by  Ann Held, DO while in the presence of [place], DO, personally preformed  the services described in this documentation.  All medical record entries made by the scribe were at my direction and in my presence.  I have reviewed the chart and discharge instructions (if applicable) and agree that the record reflects my personal performance and is accurate and complete. 07/08/22   Ann Held, DO

## 2022-07-08 NOTE — Assessment & Plan Note (Signed)
Cholesterol--- LDL goal < 70,  HDL >40,  TG < 150.  Diet and exercise will increase HDL and decrease LDL and TG.  Fish,  Fish Oil, Flaxseed oil will also help increase the HDL and decrease Triglycerides.    Lab Results  Component Value Date   CHOL 166 05/06/2022   HDL 92.20 05/06/2022   LDLCALC 60 05/06/2022   LDLDIRECT 188.0 08/08/2012   TRIG 70.0 05/06/2022   CHOLHDL 2 05/06/2022   .

## 2022-07-08 NOTE — Assessment & Plan Note (Signed)
Pt cleared to have R knee replacement

## 2022-07-09 ENCOUNTER — Ambulatory Visit: Payer: Medicare PPO | Admitting: Family Medicine

## 2022-07-15 DIAGNOSIS — M25561 Pain in right knee: Secondary | ICD-10-CM | POA: Diagnosis not present

## 2022-07-21 ENCOUNTER — Ambulatory Visit (INDEPENDENT_AMBULATORY_CARE_PROVIDER_SITE_OTHER): Payer: Medicare PPO | Admitting: Podiatry

## 2022-07-21 ENCOUNTER — Encounter: Payer: Self-pay | Admitting: Podiatry

## 2022-07-21 DIAGNOSIS — M7752 Other enthesopathy of left foot: Secondary | ICD-10-CM | POA: Diagnosis not present

## 2022-07-21 DIAGNOSIS — M2042 Other hammer toe(s) (acquired), left foot: Secondary | ICD-10-CM

## 2022-07-21 MED ORDER — TRIAMCINOLONE ACETONIDE 10 MG/ML IJ SUSP
10.0000 mg | Freq: Once | INTRAMUSCULAR | Status: AC
  Administered 2022-07-21: 10 mg

## 2022-07-21 NOTE — Progress Notes (Signed)
Subjective:   Patient ID: Kim Mclaughlin, female   DOB: 77 y.o.   MRN: [id number]   HPI Patient presents with chronic lesion formation and digital deformities bilateral and an area on the second digit left it has been very tender and she is having knee replacement third week of May and wants some kind of treatment for this understanding she may need surgery   ROS      Objective:  Physical Exam  Neurovascular status intact inflammation of the inner phalangeal joint digit to left with keratotic lesion digital deformity digits 2 3 about both feet painful with lesions plantar also that can become tender especially around the third metatarsal     Assessment:  Continued stubborn inflammatory capsulitis interphalangeal joint digit to left hammertoe deformity bilateral and lesions stubborn plantar left      Plan:  Did 1 more very careful injection of the inner phalangeal joint left 2 mg dexamethasone debrided the lesion and then discussed hammertoe repair that we may need to do but will probably have to wait till after the knee surgery.  I went ahead today debrided plantar lesion discussed orthotics if symptoms were to come back quickly plantar

## 2022-08-09 ENCOUNTER — Encounter: Payer: Self-pay | Admitting: Family Medicine

## 2022-08-10 ENCOUNTER — Encounter: Payer: Self-pay | Admitting: Family Medicine

## 2022-08-10 ENCOUNTER — Ambulatory Visit (INDEPENDENT_AMBULATORY_CARE_PROVIDER_SITE_OTHER): Payer: Medicare PPO | Admitting: Family Medicine

## 2022-08-10 VITALS — BP 126/78 | HR 81 | Temp 97.8°F | Resp 16 | Ht 63.0 in | Wt 145.2 lb

## 2022-08-10 DIAGNOSIS — R194 Change in bowel habit: Secondary | ICD-10-CM | POA: Diagnosis not present

## 2022-08-10 NOTE — Progress Notes (Signed)
Subjective:   By signing my name below, I, [name], attest that this documentation has been prepared under the direction and in the presence of Donato Schultz, DO. 08/10/2022   Patient ID: Kim Mclaughlin, female    DOB: 03/28/1946, 77 y.o.   MRN: [id number]  Chief Complaint  Patient presents with   change in BM    Soft stools    HPI Patient is in today for a office visit.  She complains of on and off soft stools. She denies having blood in her stool or uncontrolled bowel movements. It takes her longer to clean herself after soft stool bowel movement. She is not taking benefiber regularly and is willing to start to help her symptoms.  She has upcomming knee replacement surgery Sep 15, 2022.    Past Medical History:  Diagnosis Date   Allergy    Arthritis 07/2014   Right hand, prescribed prednisone-no longer taking. also Rt knww   Brain aneurysm    DVT (deep venous thrombosis) 2015   very small; treated with compression and heat, no other problems since   GERD (gastroesophageal reflux disease)    patient denies states she does have hx of peptic ulcer many years   Hepatitis    in 10 th grade  (not sure type- not hep C) - treated  no other problems   Hyperlipidemia    diet controlled   Memory loss    MVP (mitral valve prolapse)    no problems, MD told patient she could not hear it   OSA (obstructive sleep apnea) 10/08/2021   Osteoarthritis    Osteopenia    [facility]   PONV (postoperative nausea and vomiting)    with ear surgery only - No other problems with other surgeries   Precancerous lesion 12/2014   Treated with cream only   TGA (transient global amnesia)    Varicose veins    Vitamin D deficiency     Past Surgical History:  Procedure Laterality Date   APPENDECTOMY     BUNIONECTOMY WITH HAMMERTOE RECONSTRUCTION Left 04/22/2016   Procedure: MODIFIED MCBRIDE BUNIONECTOMY,  LEFT SECOND HAMMERTOE CORRECTION;  Surgeon: Toni Arthurs, MD;  Location: [facility];  Service: Orthopedics;  Laterality: Left;   COLONOSCOPY     07/2012 Juanda Chance - [facility]/father - polyps   colonoscopy with polypectomy  07/2012    hyperplastic;Dr Brodie   DILATION AND CURETTAGE OF UTERUS     DILATION AND CURETTAGE OF UTERUS N/A 09/03/2014   Procedure: DILATATION AND CURETTAGE;  Surgeon: Donovan Kail, MD;  Location: [facility];  Service: Gynecology;  Laterality: N/A;   ENDOVENOUS ABLATION SAPHENOUS VEIN W/ LASER Left 01-31-2014   EVLA  left greater saphenous vein  by Gretta Began MD   ENDOVENOUS ABLATION SAPHENOUS VEIN W/ LASER Right 03-14-2014   endovenous laser ablation right greater saphenous vein  by Gretta Began MD   FOOT ARTHRODESIS Left 04/22/2016   Procedure: LEFT FIRST TARSAL METATARSAL ARTHRODESIS;  Surgeon: Toni Arthurs, MD;  Location: [facility];  Service: Orthopedics;  Laterality: Left;   G 3 P 3     HYSTEROSCOPY WITH D & C N/A 03/25/2016   Procedure: DILATATION AND CURETTAGE /HYSTEROSCOPY;  Surgeon: Marlow Baars, MD;  Location: [facility];  Service: Gynecology;  Laterality: N/A;  WITH ULTRASOUND GUIDANCE   left ear surgery     STAPEDECTOMY     TARSAL METATARSAL FUSION WITH WEIL OSTEOTOMY Left 04/22/2016   Procedure: 2-4  TARSAL METATARSAL FUSION WITH WEIL OSTEOTOMY;  Surgeon: Toni Arthurs, MD;  Location: [facility];  Service: Orthopedics;  Laterality: Left;   TUBAL LIGATION      Family History  Problem Relation Age of Onset   Pancreatic cancer Mother    Miscarriages / [place] Mother    Thyroid disease Mother        partial thyroidectomy ; S/P RAI   Heart attack Father        in early 49s   Colon cancer Father    Seizures Sister    Ovarian cancer Maternal Aunt    Breast cancer Paternal Aunt    Aneurysm Paternal Uncle        brain   Colon cancer Paternal Grandmother    Stroke Paternal Grandmother        > 65   Heart attack Paternal Grandfather        in 37s   Cancer Other        among paternal sibs  (colon, breast, bone)   Diabetes Neg Hx    Stomach cancer Neg Hx    Ulcerative colitis Neg Hx    Esophageal cancer Neg Hx    Rectal cancer Neg Hx     Social History   Socioeconomic History   Marital status: Married    Spouse name: Dorene Sorrow   Number of children: Not on file   Years of education: Not on file   Highest education level: Not on file  Occupational History   Occupation: retired  Tobacco Use   Smoking status: Former    Types: Cigarettes    Quit date: 04/27/1963    Years since quitting: 59.3   Smokeless tobacco: Never   Tobacco comments:    Smoked < 6 mos;< 1 pack total;quit 1965  Vaping Use   Vaping Use: Never used  Substance and Sexual Activity   Alcohol use: No   Drug use: No   Sexual activity: Yes    Birth control/protection: Post-menopausal  Other Topics Concern   Not on file  Social History Narrative   Lives with husband   Right Handed   Drinks average 2-3 cups caffeine daily   Social Determinants of Health   Financial Resource Strain: Low Risk  (02/25/2021)   Overall Financial Resource Strain (CARDIA)    Difficulty of Paying Living Expenses: Not hard at all  Food Insecurity: No Food Insecurity (02/25/2021)   Hunger Vital Sign    Worried About Running Out of Food in the Last Year: Never true    Ran Out of Food in the Last Year: Never true  Transportation Needs: No Transportation Needs (02/25/2021)   PRAPARE - Administrator, Civil Service (Medical): No    Lack of Transportation (Non-Medical): No  Physical Activity: Inactive (02/25/2021)   Exercise Vital Sign    Days of Exercise per Week: 0 days    Minutes of Exercise per Session: 0 min  Stress: No Stress Concern Present (02/25/2021)   [employer] of Occupational Health - Occupational Stress Questionnaire    Feeling of Stress : Not at all  Social Connections: Moderately Integrated (02/25/2021)   Social Connection and Isolation Panel [NHANES]    Frequency of Communication with Friends  and Family: More than three times a week    Frequency of Social Gatherings with Friends and Family: More than three times a week    Attends Religious Services: More than 4 times per year    Active Member of [employer]  or Organizations: No    Attends Banker Meetings: Never    Marital Status: Married  Catering manager Violence: Not At Risk (02/25/2021)   Humiliation, Afraid, Rape, and Kick questionnaire    Fear of Current or Ex-Partner: No    Emotionally Abused: No    Physically Abused: No    Sexually Abused: No    Outpatient Medications Prior to Visit  Medication Sig Dispense Refill   Ascorbic Acid (VITA-C PO) Take by mouth.     aspirin EC 81 MG tablet Take 1 tablet (81 mg total) by mouth daily. Swallow whole. 30 tablet 12   atorvastatin (LIPITOR) 40 MG tablet Take 1 tablet (40 mg total) by mouth daily. 90 tablet 3   Boswellia-Glucosamine-Vit D (OSTEO BI-FLEX ONE PER DAY PO) Take 1 tablet by mouth 2 (two) times daily.     cetirizine (ZYRTEC) 10 MG tablet Take 10 mg by mouth daily as needed for allergies.     Glucosamine HCl-MSM (MSM GLUCOSAMINE PO) Take 2 tablets by mouth daily.     hydrocortisone cream 1 % Apply to affected area 2 times daily 15 g 0   MAGNESIUM PO Take 1 capsule by mouth daily.     Multiple Vitamins-Minerals (MULTIVITAMIN ADULTS 50+) TABS Take 1 tablet by mouth daily.     Omega-3 Fatty Acids (FISH OIL PO) Take 1 capsule by mouth daily.     triamcinolone (NASACORT) 55 MCG/ACT AERO nasal inhaler Place 2 sprays into the nose daily as needed (allergies).      triamcinolone cream (KENALOG) 0.1 % Apply 1 application topically 2 (two) times daily as needed (eczema.).      UNABLE TO FIND Take 1 tablet by mouth 3 (three) times daily. Med Name: bone strength- plant based calcium     escitalopram (LEXAPRO) 10 MG tablet Take by mouth. (Patient not taking: Reported on 08/10/2022)     No facility-administered medications prior to visit.    Allergies  Allergen Reactions    Macrobid  [Nitrofurantoin Macrocrystal] Other (See Comments)   Doxycycline Hives   Pravastatin Sodium     REACTION: shin pain   Amoxicillin-Pot Clavulanate     GI symptoms; abdominal pain & nausea NOTE: able to take Ampicillin   Augmentin [Amoxicillin-Pot Clavulanate] Nausea Only    Upsets stomach   Codeine Nausea Only   Methylphenidate Hcl Palpitations   Methylprednisolone Rash    Rash and redness on face   Sulfamethoxazole-Trimethoprim Nausea Only     nausea    Review of Systems  Constitutional:  Negative for fever and malaise/fatigue.  HENT:  Negative for congestion.   Eyes:  Negative for blurred vision.  Respiratory:  Negative for shortness of breath.   Cardiovascular:  Negative for chest pain, palpitations and leg swelling.  Gastrointestinal:  Negative for abdominal pain, blood in stool and nausea.       (+)soft stools (-)uncontrolled bowel movement  Genitourinary:  Negative for dysuria and frequency.  Musculoskeletal:  Negative for falls.  Skin:  Negative for rash.  Neurological:  Negative for dizziness, loss of consciousness and headaches.  Endo/Heme/Allergies:  Negative for environmental allergies.  Psychiatric/Behavioral:  Negative for depression. The patient is not nervous/anxious.        Objective:    Physical Exam Vitals and nursing note reviewed.  Constitutional:      General: She is not in acute distress.    Appearance: Normal appearance. She is well-developed. She is not ill-appearing.  HENT:     Head: Normocephalic  and atraumatic.     Right Ear: External ear normal.     Left Ear: External ear normal.  Eyes:     Extraocular Movements: Extraocular movements intact.     Conjunctiva/sclera: Conjunctivae normal.     Pupils: Pupils are equal, round, and reactive to light.  Neck:     Thyroid: No thyromegaly.     Vascular: No carotid bruit or JVD.  Cardiovascular:     Rate and Rhythm: Normal rate and regular rhythm.     Heart sounds: Normal heart sounds.  No murmur heard.    No gallop.  Pulmonary:     Effort: Pulmonary effort is normal. No respiratory distress.     Breath sounds: Normal breath sounds. No wheezing or rales.  Chest:     Chest wall: No tenderness.  Musculoskeletal:     Cervical back: Normal range of motion and neck supple.  Skin:    General: Skin is warm and dry.  Neurological:     Mental Status: She is alert and oriented to person, place, and time.  Psychiatric:        Judgment: Judgment normal.     BP 126/78   Pulse 81   Temp 97.8 F (36.6 C) (Oral)   Resp 16   Ht 5\' 3"  (1.6 m)   Wt 145 lb 4 oz (65.9 kg)   SpO2 98%   BMI 25.73 kg/m  Wt Readings from Last 3 Encounters:  08/10/22 145 lb 4 oz (65.9 kg)  07/08/22 142 lb 6.4 oz (64.6 kg)  06/30/22 142 lb 8 oz (64.6 kg)       Assessment & Plan:  Altered bowel habits Assessment & Plan: Pt is not constipated , no diarrhea  She will take benefiber daily And will use wet wipes and consider getting a bidet for her toilet  F/U with GI if symptoms persists      I, Donato Schultz, DO, personally preformed the services described in this documentation.  All medical record entries made by the scribe were at my direction and in my presence.  I have reviewed the chart and discharge instructions (if applicable) and agree that the record reflects my personal performance and is accurate and complete. 08/10/2022   I,[name],acting as a scribe for Donato Schultz, DO.,have documented all relevant documentation on the behalf of Donato Schultz, DO,as directed by  Donato Schultz, DO while in the presence of Donato Schultz, DO.   Donato Schultz, DO

## 2022-08-10 NOTE — Assessment & Plan Note (Signed)
Pt is not constipated , no diarrhea  She will take benefiber daily And will use wet wipes and consider getting a bidet for her toilet  F/U with GI if symptoms persists

## 2022-08-16 ENCOUNTER — Other Ambulatory Visit: Payer: Self-pay | Admitting: Family Medicine

## 2022-08-17 MED ORDER — TRIAMCINOLONE ACETONIDE 0.1 % EX CREA: 1.0000 | TOPICAL_CREAM | Freq: Two times a day (BID) | CUTANEOUS | 1 refills | Status: DC | PRN

## 2022-08-18 ENCOUNTER — Encounter: Payer: Self-pay | Admitting: Family Medicine

## 2022-08-19 MED ORDER — TRIAMCINOLONE ACETONIDE 0.1 % EX CREA: 1.0000 | TOPICAL_CREAM | Freq: Two times a day (BID) | CUTANEOUS | 1 refills | Status: AC | PRN

## 2022-09-08 DIAGNOSIS — Z0189 Encounter for other specified special examinations: Secondary | ICD-10-CM | POA: Diagnosis not present

## 2022-09-15 DIAGNOSIS — G8918 Other acute postprocedural pain: Secondary | ICD-10-CM | POA: Diagnosis not present

## 2022-09-15 DIAGNOSIS — M1711 Unilateral primary osteoarthritis, right knee: Secondary | ICD-10-CM | POA: Diagnosis not present

## 2022-09-15 HISTORY — PX: REPLACEMENT TOTAL KNEE: SUR1224

## 2022-09-17 DIAGNOSIS — M25661 Stiffness of right knee, not elsewhere classified: Secondary | ICD-10-CM | POA: Diagnosis not present

## 2022-09-17 DIAGNOSIS — M25561 Pain in right knee: Secondary | ICD-10-CM | POA: Diagnosis not present

## 2022-09-21 DIAGNOSIS — M25561 Pain in right knee: Secondary | ICD-10-CM | POA: Diagnosis not present

## 2022-09-21 DIAGNOSIS — M25661 Stiffness of right knee, not elsewhere classified: Secondary | ICD-10-CM | POA: Diagnosis not present

## 2022-09-24 DIAGNOSIS — M25661 Stiffness of right knee, not elsewhere classified: Secondary | ICD-10-CM | POA: Diagnosis not present

## 2022-09-24 DIAGNOSIS — M25561 Pain in right knee: Secondary | ICD-10-CM | POA: Diagnosis not present

## 2022-09-27 DIAGNOSIS — M25561 Pain in right knee: Secondary | ICD-10-CM | POA: Diagnosis not present

## 2022-09-27 DIAGNOSIS — M25661 Stiffness of right knee, not elsewhere classified: Secondary | ICD-10-CM | POA: Diagnosis not present

## 2022-09-29 DIAGNOSIS — M25661 Stiffness of right knee, not elsewhere classified: Secondary | ICD-10-CM | POA: Diagnosis not present

## 2022-09-29 DIAGNOSIS — M25561 Pain in right knee: Secondary | ICD-10-CM | POA: Diagnosis not present

## 2022-10-01 DIAGNOSIS — M25561 Pain in right knee: Secondary | ICD-10-CM | POA: Diagnosis not present

## 2022-10-01 DIAGNOSIS — M25661 Stiffness of right knee, not elsewhere classified: Secondary | ICD-10-CM | POA: Diagnosis not present

## 2022-10-04 DIAGNOSIS — M25661 Stiffness of right knee, not elsewhere classified: Secondary | ICD-10-CM | POA: Diagnosis not present

## 2022-10-04 DIAGNOSIS — M25561 Pain in right knee: Secondary | ICD-10-CM | POA: Diagnosis not present

## 2022-10-06 DIAGNOSIS — M25661 Stiffness of right knee, not elsewhere classified: Secondary | ICD-10-CM | POA: Diagnosis not present

## 2022-10-06 DIAGNOSIS — M25561 Pain in right knee: Secondary | ICD-10-CM | POA: Diagnosis not present

## 2022-10-11 DIAGNOSIS — M25661 Stiffness of right knee, not elsewhere classified: Secondary | ICD-10-CM | POA: Diagnosis not present

## 2022-10-11 DIAGNOSIS — M25561 Pain in right knee: Secondary | ICD-10-CM | POA: Diagnosis not present

## 2022-10-13 DIAGNOSIS — M25661 Stiffness of right knee, not elsewhere classified: Secondary | ICD-10-CM | POA: Diagnosis not present

## 2022-10-13 DIAGNOSIS — M25561 Pain in right knee: Secondary | ICD-10-CM | POA: Diagnosis not present

## 2022-10-18 DIAGNOSIS — M25561 Pain in right knee: Secondary | ICD-10-CM | POA: Diagnosis not present

## 2022-10-18 DIAGNOSIS — M25661 Stiffness of right knee, not elsewhere classified: Secondary | ICD-10-CM | POA: Diagnosis not present

## 2022-10-20 DIAGNOSIS — M25561 Pain in right knee: Secondary | ICD-10-CM | POA: Diagnosis not present

## 2022-10-20 DIAGNOSIS — M25661 Stiffness of right knee, not elsewhere classified: Secondary | ICD-10-CM | POA: Diagnosis not present

## 2022-10-20 DIAGNOSIS — Z5189 Encounter for other specified aftercare: Secondary | ICD-10-CM | POA: Diagnosis not present

## 2022-11-23 DIAGNOSIS — Z5189 Encounter for other specified aftercare: Secondary | ICD-10-CM | POA: Diagnosis not present

## 2022-12-23 ENCOUNTER — Other Ambulatory Visit: Payer: Self-pay | Admitting: Family Medicine

## 2022-12-23 DIAGNOSIS — E785 Hyperlipidemia, unspecified: Secondary | ICD-10-CM

## 2022-12-29 DIAGNOSIS — D485 Neoplasm of uncertain behavior of skin: Secondary | ICD-10-CM | POA: Diagnosis not present

## 2022-12-29 DIAGNOSIS — I788 Other diseases of capillaries: Secondary | ICD-10-CM | POA: Diagnosis not present

## 2022-12-29 DIAGNOSIS — D1801 Hemangioma of skin and subcutaneous tissue: Secondary | ICD-10-CM | POA: Diagnosis not present

## 2022-12-29 DIAGNOSIS — D692 Other nonthrombocytopenic purpura: Secondary | ICD-10-CM | POA: Diagnosis not present

## 2022-12-29 DIAGNOSIS — L814 Other melanin hyperpigmentation: Secondary | ICD-10-CM | POA: Diagnosis not present

## 2022-12-29 DIAGNOSIS — L821 Other seborrheic keratosis: Secondary | ICD-10-CM | POA: Diagnosis not present

## 2022-12-29 DIAGNOSIS — L738 Other specified follicular disorders: Secondary | ICD-10-CM | POA: Diagnosis not present

## 2022-12-29 DIAGNOSIS — L280 Lichen simplex chronicus: Secondary | ICD-10-CM | POA: Diagnosis not present

## 2023-01-03 ENCOUNTER — Encounter: Payer: Medicare PPO | Admitting: Family Medicine

## 2023-01-06 ENCOUNTER — Encounter: Payer: Self-pay | Admitting: Family Medicine

## 2023-01-06 ENCOUNTER — Ambulatory Visit (INDEPENDENT_AMBULATORY_CARE_PROVIDER_SITE_OTHER): Payer: Medicare PPO | Admitting: Family Medicine

## 2023-01-06 VITALS — BP 122/70 | HR 73 | Temp 98.5°F | Resp 18 | Ht 63.0 in | Wt 143.0 lb

## 2023-01-06 DIAGNOSIS — F418 Other specified anxiety disorders: Secondary | ICD-10-CM | POA: Diagnosis not present

## 2023-01-06 DIAGNOSIS — G454 Transient global amnesia: Secondary | ICD-10-CM | POA: Diagnosis not present

## 2023-01-06 DIAGNOSIS — K635 Polyp of colon: Secondary | ICD-10-CM | POA: Diagnosis not present

## 2023-01-06 DIAGNOSIS — E782 Mixed hyperlipidemia: Secondary | ICD-10-CM | POA: Diagnosis not present

## 2023-01-06 DIAGNOSIS — R739 Hyperglycemia, unspecified: Secondary | ICD-10-CM

## 2023-01-06 DIAGNOSIS — M8000XA Age-related osteoporosis with current pathological fracture, unspecified site, initial encounter for fracture: Secondary | ICD-10-CM

## 2023-01-06 DIAGNOSIS — M1711 Unilateral primary osteoarthritis, right knee: Secondary | ICD-10-CM

## 2023-01-06 DIAGNOSIS — Z0000 Encounter for general adult medical examination without abnormal findings: Secondary | ICD-10-CM | POA: Diagnosis not present

## 2023-01-06 NOTE — Assessment & Plan Note (Signed)
resolved 

## 2023-01-06 NOTE — Assessment & Plan Note (Deleted)
Encourage heart healthy diet such as MIND or DASH diet, increase exercise, avoid trans fats, simple carbohydrates and processed foods, consider a krill or fish or flaxseed oil cap daily.  °

## 2023-01-06 NOTE — Assessment & Plan Note (Signed)
Encourage heart healthy diet such as MIND or DASH diet, increase exercise, avoid trans fats, simple carbohydrates and processed foods, consider a krill or fish or flaxseed oil cap daily.  °

## 2023-01-06 NOTE — Progress Notes (Signed)
Established Patient Office Visit  Subjective   Patient ID: Kim Mclaughlin, female    DOB: 20-Dec-1945  Age: 77 y.o. MRN: [id number]  Chief Complaint  Patient presents with   Annual Exam    Pt states fasting     HPI Discussed the use of AI scribe software for clinical note transcription with the patient, who gave verbal consent to proceed.  History of Present Illness   The patient, with a history of right knee osteoarthritis, presents for follow-up after a right knee replacement in May. The procedure was performed by Dr. Merlyn Albert at an outpatient surgery center and was well tolerated. The patient reports that she can cross her leg and pull it up, but she is unable to perform a quad stretch. She has been advised that this will improve with continued exercises.  The patient also reports a fall in December, during which she broke a tooth but did not sustain any other injuries. The tooth has since been repaired.  The patient's left knee is currently not symptomatic, and she is trying to keep it strong. She has previously received cortisone shots and gel injections for her right knee, but these treatments became ineffective over time.  The patient is currently taking a one-a-day vitamin for seniors and is considering trying a fruit and vegetable supplement.      Patient Active Problem List   Diagnosis Date Noted   Altered bowel habits 08/10/2022   Primary osteoarthritis of right knee 07/08/2022   Chest pain 05/06/2022   Acute left-sided low back pain with left-sided sciatica 12/14/2021   Snoring 11/20/2021   Skin tear of lower leg without complication, right, initial encounter 11/20/2021   Depression with anxiety 11/20/2021   OSA (obstructive sleep apnea) 10/08/2021   TGA (transient global amnesia) 09/30/2021   Varicose veins of both lower extremities with pain 06/21/2021   Preventative health care 10/27/2020   Leg cramps 10/27/2020   Brain aneurysm 05/08/2020   Aneurysm (HCC)  05/02/2020   Transient global amnesia 05/01/2020   Depression, major, single episode, moderate (HCC) 08/05/2017   Loss of transverse plantar arch 10/10/2015   Arthritis of foot, degenerative 07/16/2015   Wellness examination 06/30/2015   Varicose veins of lower extremities with complications 01/31/2014   Varicose veins of lower extremities with other complications 10/05/2013   Chronic venous insufficiency 10/05/2013   Hyperplastic colonic polyp 08/15/2013   Unspecified adverse effect of unspecified drug, medicinal and biological substance 07/05/2011   ARTHRALGIA 10/13/2009   HEMIANOPSIA, HOMONYMOUS, LEFT 10/24/2008   VARICOSE VEINS, LOWER EXTREMITIES 10/24/2008   Osteoporosis 02/29/2008   Hyperlipidemia 07/25/2007   MITRAL VALVE PROLAPSE 05/09/2007   ABNORMAL HEART RHYTHMS 05/09/2007   ADD 02/28/2007   Past Medical History:  Diagnosis Date   Allergy    Arthritis 07/2014   Right hand, prescribed prednisone-no longer taking. also Rt knww   Brain aneurysm    DVT (deep venous thrombosis) (HCC) 2015   very small; treated with compression and heat, no other problems since   GERD (gastroesophageal reflux disease)    patient denies states she does have hx of peptic ulcer many years   Hepatitis    in 10 th grade  (not sure type- not hep C) - treated  no other problems   Hyperlipidemia    diet controlled   Memory loss    MVP (mitral valve prolapse)    no problems, MD told patient she could not hear it   OSA (obstructive sleep apnea) 10/08/2021  Osteoarthritis    Osteopenia    [facility]   PONV (postoperative nausea and vomiting)    with ear surgery only - No other problems with other surgeries   Precancerous lesion 12/2014   Treated with cream only   TGA (transient global amnesia)    Varicose veins    Vitamin D deficiency    Past Surgical History:  Procedure Laterality Date   APPENDECTOMY     BUNIONECTOMY WITH HAMMERTOE RECONSTRUCTION Left 04/22/2016   Procedure: MODIFIED  MCBRIDE BUNIONECTOMY,  LEFT SECOND HAMMERTOE CORRECTION;  Surgeon: Toni Arthurs, MD;  Location: [facility];  Service: Orthopedics;  Laterality: Left;   COLONOSCOPY     07/2012 Juanda Chance - [facility]/father - polyps   colonoscopy with polypectomy  07/2012    hyperplastic;Dr Brodie   DILATION AND CURETTAGE OF UTERUS     DILATION AND CURETTAGE OF UTERUS N/A 09/03/2014   Procedure: DILATATION AND CURETTAGE;  Surgeon: Donovan Kail, MD;  Location: [facility];  Service: Gynecology;  Laterality: N/A;   ENDOVENOUS ABLATION SAPHENOUS VEIN W/ LASER Left 01/31/2014   EVLA  left greater saphenous vein  by Gretta Began MD   ENDOVENOUS ABLATION SAPHENOUS VEIN W/ LASER Right 03/14/2014   endovenous laser ablation right greater saphenous vein  by Gretta Began MD   FOOT ARTHRODESIS Left 04/22/2016   Procedure: LEFT FIRST TARSAL METATARSAL ARTHRODESIS;  Surgeon: Toni Arthurs, MD;  Location: [facility];  Service: Orthopedics;  Laterality: Left;   G 3 P 3     HYSTEROSCOPY WITH D & C N/A 03/25/2016   Procedure: DILATATION AND CURETTAGE /HYSTEROSCOPY;  Surgeon: Marlow Baars, MD;  Location: [facility];  Service: Gynecology;  Laterality: N/A;  WITH ULTRASOUND GUIDANCE   left ear surgery     REPLACEMENT TOTAL KNEE Right 09/15/2022   aluisio   STAPEDECTOMY     TARSAL METATARSAL FUSION WITH WEIL OSTEOTOMY Left 04/22/2016   Procedure: 2-4 TARSAL METATARSAL FUSION WITH WEIL OSTEOTOMY;  Surgeon: Toni Arthurs, MD;  Location: [facility];  Service: Orthopedics;  Laterality: Left;   TUBAL LIGATION     Social History   Tobacco Use   Smoking status: Former    Current packs/day: 0.00    Types: Cigarettes    Quit date: 04/27/1963    Years since quitting: 59.7   Smokeless tobacco: Never   Tobacco comments:    Smoked < 6 mos;< 1 pack total;quit 1965  Vaping Use   Vaping status: Never Used  Substance Use Topics   Alcohol use: No   Drug use: No   Social History   Socioeconomic  History   Marital status: Married    Spouse name: Dorene Sorrow   Number of children: Not on file   Years of education: Not on file   Highest education level: Not on file  Occupational History   Occupation: retired  Tobacco Use   Smoking status: Former    Current packs/day: 0.00    Types: Cigarettes    Quit date: 04/27/1963    Years since quitting: 59.7   Smokeless tobacco: Never   Tobacco comments:    Smoked < 6 mos;< 1 pack total;quit 1965  Vaping Use   Vaping status: Never Used  Substance and Sexual Activity   Alcohol use: No   Drug use: No   Sexual activity: Yes    Birth control/protection: Post-menopausal  Other Topics Concern   Not on file  Social History Narrative   Lives with husband  Right Handed   Drinks average 2-3 cups caffeine daily   Social Determinants of Health   Financial Resource Strain: Low Risk  (02/25/2021)   Overall Financial Resource Strain (CARDIA)    Difficulty of Paying Living Expenses: Not hard at all  Food Insecurity: No Food Insecurity (02/25/2021)   Hunger Vital Sign    Worried About Running Out of Food in the Last Year: Never true    Ran Out of Food in the Last Year: Never true  Transportation Needs: No Transportation Needs (02/25/2021)   PRAPARE - Administrator, Civil Service (Medical): No    Lack of Transportation (Non-Medical): No  Physical Activity: Inactive (02/25/2021)   Exercise Vital Sign    Days of Exercise per Week: 0 days    Minutes of Exercise per Session: 0 min  Stress: No Stress Concern Present (02/25/2021)   [employer] of Occupational Health - Occupational Stress Questionnaire    Feeling of Stress : Not at all  Social Connections: Moderately Integrated (02/25/2021)   Social Connection and Isolation Panel [NHANES]    Frequency of Communication with Friends and Family: More than three times a week    Frequency of Social Gatherings with Friends and Family: More than three times a week    Attends Religious Services:  More than 4 times per year    Active Member of [employer] or Organizations: No    Attends Banker Meetings: Never    Marital Status: Married  Catering manager Violence: Not At Risk (02/25/2021)   Humiliation, Afraid, Rape, and Kick questionnaire    Fear of Current or Ex-Partner: No    Emotionally Abused: No    Physically Abused: No    Sexually Abused: No   Family Status  Relation Name Status   Mother  Deceased at age 42   Father  Deceased at age 65   Sister  Alive   Sister  Alive   Sister  Chemical engineer  (Not Specified)   Oceanographer  (Not Specified)   Nutritional therapist  (Not Specified)   PGM  (Not Specified)   PGF  (Not Specified)   Other  (Not Specified)   Neg Hx  (Not Specified)  No partnership data on file   Family History  Problem Relation Age of Onset   Pancreatic cancer Mother    Miscarriages / [place] Mother    Thyroid disease Mother        partial thyroidectomy ; S/P RAI   Heart attack Father        in early 61s   Colon cancer Father    Seizures Sister    Ovarian cancer Maternal Aunt    Breast cancer Paternal Aunt    Aneurysm Paternal Uncle        brain   Colon cancer Paternal Grandmother    Stroke Paternal Grandmother        > 65   Heart attack Paternal Grandfather        in 36s   Cancer Other        among paternal sibs (colon, breast, bone)   Diabetes Neg Hx    Stomach cancer Neg Hx    Ulcerative colitis Neg Hx    Esophageal cancer Neg Hx    Rectal cancer Neg Hx    Allergies  Allergen Reactions   Macrobid  [Nitrofurantoin Macrocrystal] Other (See Comments)   Doxycycline Hives   Pravastatin Sodium     REACTION: shin pain  Amoxicillin-Pot Clavulanate     GI symptoms; abdominal pain & nausea NOTE: able to take Ampicillin   Augmentin [Amoxicillin-Pot Clavulanate] Nausea Only    Upsets stomach   Codeine Nausea Only   Methylphenidate Hcl Palpitations   Methylprednisolone Rash    Rash and redness on face   Sulfamethoxazole-Trimethoprim  Nausea Only     nausea      ROS    Objective:     BP 122/70 (BP Location: Left Arm, Patient Position: Sitting, Cuff Size: Normal)   Pulse 73   Temp 98.5 F (36.9 C) (Oral)   Resp 18   Ht 5\' 3"  (1.6 m)   Wt 143 lb (64.9 kg)   SpO2 96%   BMI 25.33 kg/m  BP Readings from Last 3 Encounters:  01/06/23 122/70  08/10/22 126/78  07/08/22 134/78   Wt Readings from Last 3 Encounters:  01/06/23 143 lb (64.9 kg)  08/10/22 145 lb 4 oz (65.9 kg)  07/08/22 142 lb 6.4 oz (64.6 kg)   SpO2 Readings from Last 3 Encounters:  01/06/23 96%  08/10/22 98%  07/08/22 97%      Physical Exam Vitals and nursing note reviewed.  Constitutional:      General: She is not in acute distress.    Appearance: Normal appearance. She is well-developed.  HENT:     Head: Normocephalic and atraumatic.     Right Ear: Tympanic membrane, ear canal and external ear normal. There is no impacted cerumen.     Left Ear: Tympanic membrane, ear canal and external ear normal. There is no impacted cerumen.     Nose: Nose normal.     Mouth/Throat:     Mouth: Mucous membranes are moist.     Pharynx: Oropharynx is clear. No oropharyngeal exudate or posterior oropharyngeal erythema.  Eyes:     General: No scleral icterus.       Right eye: No discharge.        Left eye: No discharge.     Conjunctiva/sclera: Conjunctivae normal.     Pupils: Pupils are equal, round, and reactive to light.  Neck:     Thyroid: No thyromegaly or thyroid tenderness.     Vascular: No JVD.  Cardiovascular:     Rate and Rhythm: Normal rate and regular rhythm.     Heart sounds: Normal heart sounds. No murmur heard. Pulmonary:     Effort: Pulmonary effort is normal. No respiratory distress.     Breath sounds: Normal breath sounds.  Abdominal:     General: Bowel sounds are normal. There is no distension.     Palpations: Abdomen is soft. There is no mass.     Tenderness: There is no abdominal tenderness. There is no guarding or rebound.   Genitourinary:    Vagina: Normal.  Musculoskeletal:        General: Normal range of motion.     Cervical back: Normal range of motion and neck supple.     Right lower leg: No edema.     Left lower leg: No edema.  Lymphadenopathy:     Cervical: No cervical adenopathy.  Skin:    General: Skin is warm and dry.     Findings: No erythema or rash.  Neurological:     General: No focal deficit present.     Mental Status: She is alert and oriented to person, place, and time.     Cranial Nerves: No cranial nerve deficit.     Deep Tendon Reflexes: Reflexes are normal  and symmetric.  Psychiatric:        Mood and Affect: Mood normal.        Behavior: Behavior normal.        Thought Content: Thought content normal.        Judgment: Judgment normal.      No results found for any visits on 01/06/23.  Last CBC Lab Results  Component Value Date   WBC 5.2 05/06/2022   HGB 13.9 05/06/2022   HCT 41.9 05/06/2022   MCV 94.0 05/06/2022   MCH 31.4 04/29/2022   RDW 13.5 05/06/2022   PLT 349.0 05/06/2022   Last metabolic panel Lab Results  Component Value Date   GLUCOSE 95 05/19/2022   NA 149 (H) 05/19/2022   K 5.0 05/19/2022   CL 107 (H) 05/19/2022   CO2 25 05/19/2022   BUN 15 05/19/2022   CREATININE 0.83 05/19/2022   EGFR 73 05/19/2022   CALCIUM 10.1 05/19/2022   PHOS 3.4 10/21/2020   PROT 6.5 05/06/2022   ALBUMIN 4.1 05/06/2022   BILITOT 0.7 05/06/2022   ALKPHOS 69 05/06/2022   AST 20 05/06/2022   ALT 23 05/06/2022   ANIONGAP 11 04/29/2022   Last lipids Lab Results  Component Value Date   CHOL 166 05/06/2022   HDL 92.20 05/06/2022   LDLCALC 60 05/06/2022   LDLDIRECT 188.0 08/08/2012   TRIG 70.0 05/06/2022   CHOLHDL 2 05/06/2022   Last hemoglobin A1c Lab Results  Component Value Date   HGBA1C 5.7 (H) 05/02/2020   Last thyroid functions Lab Results  Component Value Date   TSH 1.74 10/02/2021   Last vitamin D Lab Results  Component Value Date   VD25OH 81  10/02/2021   Last vitamin B12 and Folate Lab Results  Component Value Date   VITAMINB12 538 10/02/2021   FOLATE 79.4 05/01/2020      The 10-year ASCVD risk score (Arnett DK, et al., 2019) is: 18.9%    Assessment & Plan:   Problem List Items Addressed This Visit       Unprioritized   Osteoporosis   Relevant Orders   TSH   TGA (transient global amnesia)   Relevant Orders   CBC with Differential/Platelet   Comprehensive metabolic panel   TSH   Depression with anxiety   Relevant Orders   TSH   Primary osteoarthritis of right knee   Transient global amnesia    resolved      Preventative health care - Primary    Ghm utd Check labs  See AVS Health Maintenance  Topic Date Due   COVID-19 Vaccine (1 - 2023-24 season) 01/22/2023 (Originally 12/26/2022)   INFLUENZA VACCINE  07/25/2023 (Originally 11/25/2022)   Medicare Annual Wellness (AWV)  03/17/2023   DEXA SCAN  08/08/2023   Colonoscopy  11/24/2025   DTaP/Tdap/Td (3 - Td or Tdap) 06/26/2028   Pneumonia Vaccine 21+ Years old  Completed   Hepatitis C Screening  Completed   Zoster Vaccines- Shingrix  Completed   HPV VACCINES  Aged Out         Hyperplastic colonic polyp   Hyperlipidemia    Encourage heart healthy diet such as MIND or DASH diet, increase exercise, avoid trans fats, simple carbohydrates and processed foods, consider a krill or fish or flaxseed oil cap daily.        Relevant Orders   Comprehensive metabolic panel   Lipid panel   Other Visit Diagnoses     Hyperglycemia       Relevant  Orders   Comprehensive metabolic panel       No follow-ups on file.    Donato Schultz, DO

## 2023-01-06 NOTE — Assessment & Plan Note (Signed)
Ghm utd Check labs  See AVS Health Maintenance  Topic Date Due   COVID-19 Vaccine (1 - 2023-24 season) 01/22/2023 (Originally 12/26/2022)   INFLUENZA VACCINE  07/25/2023 (Originally 11/25/2022)   Medicare Annual Wellness (AWV)  03/17/2023   DEXA SCAN  08/08/2023   Colonoscopy  11/24/2025   DTaP/Tdap/Td (3 - Td or Tdap) 06/26/2028   Pneumonia Vaccine 15+ Years old  Completed   Hepatitis C Screening  Completed   Zoster Vaccines- Shingrix  Completed   HPV VACCINES  Aged Out

## 2023-01-07 LAB — LIPID PANEL
Cholesterol: 141 mg/dL (ref 0–200)
HDL: 77.2 mg/dL (ref >39.00)
LDL Cholesterol: 41 mg/dL (ref 0–99)
NonHDL: 63.85
Total CHOL/HDL Ratio: 2
Triglycerides: 116 mg/dL (ref 0.0–149.0)
VLDL: 23.2 mg/dL (ref 0.0–40.0)

## 2023-01-07 LAB — COMPREHENSIVE METABOLIC PANEL
ALT: 18 U/L (ref 0–35)
AST: 21 U/L (ref 0–37)
Albumin: 3.9 g/dL (ref 3.5–5.2)
Alkaline Phosphatase: 94 U/L (ref 39–117)
BUN: 16 mg/dL (ref 6–23)
CO2: 28 meq/L (ref 19–32)
Calcium: 10.2 mg/dL (ref 8.4–10.5)
Chloride: 104 meq/L (ref 96–112)
Creatinine, Ser: 0.88 mg/dL (ref 0.40–1.20)
GFR: 63.29 mL/min (ref >60.00)
Glucose, Bld: 86 mg/dL (ref 70–99)
Potassium: 4 meq/L (ref 3.5–5.1)
Sodium: 141 meq/L (ref 135–145)
Total Bilirubin: 0.6 mg/dL (ref 0.2–1.2)
Total Protein: 6.6 g/dL (ref 6.0–8.3)

## 2023-01-07 LAB — CBC WITH DIFFERENTIAL/PLATELET
Basophils Absolute: 0.1 10*3/uL (ref 0.0–0.1)
Basophils Relative: 1.2 % (ref 0.0–3.0)
Eosinophils Absolute: 0.4 10*3/uL (ref 0.0–0.7)
Eosinophils Relative: 5.8 % — ABNORMAL HIGH (ref 0.0–5.0)
HCT: 40.1 % (ref 36.0–46.0)
Hemoglobin: 13.1 g/dL (ref 12.0–15.0)
Lymphocytes Relative: 30.5 % (ref 12.0–46.0)
Lymphs Abs: 2.3 10*3/uL (ref 0.7–4.0)
MCHC: 32.7 g/dL (ref 30.0–36.0)
MCV: 92.7 fl (ref 78.0–100.0)
Monocytes Absolute: 0.6 10*3/uL (ref 0.1–1.0)
Monocytes Relative: 7.4 % (ref 3.0–12.0)
Neutro Abs: 4.2 10*3/uL (ref 1.4–7.7)
Neutrophils Relative %: 55.1 % (ref 43.0–77.0)
Platelets: 346 10*3/uL (ref 150.0–400.0)
RBC: 4.32 Mil/uL (ref 3.87–5.11)
RDW: 13.9 % (ref 11.5–15.5)
WBC: 7.6 10*3/uL (ref 4.0–10.5)

## 2023-01-07 LAB — TSH: TSH: 1.82 u[IU]/mL (ref 0.35–5.50)

## 2023-01-15 ENCOUNTER — Other Ambulatory Visit: Payer: Self-pay | Admitting: Family Medicine

## 2023-01-15 DIAGNOSIS — E785 Hyperlipidemia, unspecified: Secondary | ICD-10-CM

## 2023-03-14 DIAGNOSIS — Z1231 Encounter for screening mammogram for malignant neoplasm of breast: Secondary | ICD-10-CM | POA: Diagnosis not present

## 2023-03-22 ENCOUNTER — Ambulatory Visit (INDEPENDENT_AMBULATORY_CARE_PROVIDER_SITE_OTHER): Payer: Medicare PPO

## 2023-03-22 VITALS — Ht 63.0 in | Wt 143.0 lb

## 2023-03-22 DIAGNOSIS — Z0000 Encounter for general adult medical examination without abnormal findings: Secondary | ICD-10-CM

## 2023-03-22 NOTE — Patient Instructions (Addendum)
Ms. Assi , Thank you for taking time to come for your Medicare Wellness Visit. I appreciate your ongoing commitment to your health goals. Please review the following plan we discussed and let me know if I can assist you in the future.   Referrals/Orders/Follow-Ups/Clinician Recommendations:   This is a list of the screening recommended for you and due dates:  Health Maintenance  Topic Date Due   COVID-19 Vaccine (1 - 2023-24 season) Never done   DEXA scan (bone density measurement)  08/08/2023   Medicare Annual Wellness Visit  03/21/2024   Colon Cancer Screening  11/24/2025   DTaP/Tdap/Td vaccine (3 - Td or Tdap) 06/26/2028   Pneumonia Vaccine  Completed   Flu Shot  Completed   Hepatitis C Screening  Completed   Zoster (Shingles) Vaccine  Completed   HPV Vaccine  Aged Out    Advanced directives: (Copy Requested) Please bring a copy of your health care power of attorney and living will to the office to be added to your chart at your convenience.  Next Medicare Annual Wellness Visit scheduled for next year: Yes

## 2023-03-22 NOTE — Progress Notes (Signed)
Subjective:   Kim Mclaughlin is a 77 y.o. female who presents for Medicare Annual (Subsequent) preventive examination.  Visit Complete: Virtual I connected with  Kim Mclaughlin on 03/22/23 by a audio enabled telemedicine application and verified that I am speaking with the correct person using two identifiers.  Patient Location: Home  Provider Location: Home Office  I discussed the limitations of evaluation and management by telemedicine. The patient expressed understanding and agreed to proceed.  Vital Signs: Because this visit was a virtual/telehealth visit, some criteria may be missing or patient reported. Any vitals not documented were not able to be obtained and vitals that have been documented are patient reported.  Patient Medicare AWV questionnaire was completed by the patient on 03/21/23; I have confirmed that all information answered by patient is correct and no changes since this date.  Cardiac Risk Factors include: advanced age (>65men, >39 women)     Objective:    Today's Vitals   03/22/23 1354  Weight: 143 lb (64.9 kg)  Height: 5\' 3"  (1.6 m)   Body mass index is 25.33 kg/m.     03/22/2023    2:02 PM 09/30/2021    4:54 PM 09/30/2021   12:06 PM 02/25/2021    9:46 AM 05/01/2020    8:23 PM 12/28/2019    9:36 AM 11/16/2018    1:37 PM  Advanced Directives  Does Patient Have a Medical Advance Directive? Yes Yes No Yes Yes Yes No  Type of Estate agent of [place];Living will Healthcare Power of [place];Living will  Healthcare Power of [place];Living will Healthcare Power of [place];Living will Healthcare Power of Attorney   Does patient want to make changes to medical advance directive?  No - Patient declined   No - Patient declined No - Patient declined   Copy of Healthcare Power of Attorney in Chart? No - copy requested No - copy requested  No - copy requested No - copy requested Yes - validated most recent copy scanned in chart (See row  information)   Would patient like information on creating a medical advance directive?  No - Patient declined No - Patient declined    Yes (ED - Information included in AVS)    Current Medications (verified) Outpatient Encounter Medications as of 03/22/2023  Medication Sig   Ascorbic Acid (VITA-C PO) Take by mouth.   aspirin EC 81 MG tablet Take 1 tablet (81 mg total) by mouth daily. Swallow whole.   atorvastatin (LIPITOR) 40 MG tablet TAKE 1 TABLET BY MOUTH EVERY DAY   Boswellia-Glucosamine-Vit D (OSTEO BI-FLEX ONE PER DAY PO) Take 1 tablet by mouth 2 (two) times daily.   cetirizine (ZYRTEC) 10 MG tablet Take 10 mg by mouth daily as needed for allergies.   Glucosamine HCl-MSM (MSM GLUCOSAMINE PO) Take 2 tablets by mouth daily.   hydrocortisone cream 1 % Apply to affected area 2 times daily   MAGNESIUM PO Take 1 capsule by mouth daily.   Multiple Vitamins-Minerals (MULTIVITAMIN ADULTS 50+) TABS Take 1 tablet by mouth daily.   Omega-3 Fatty Acids (FISH OIL PO) Take 1 capsule by mouth daily.   triamcinolone (NASACORT) 55 MCG/ACT AERO nasal inhaler Place 2 sprays into the nose daily as needed (allergies).    triamcinolone cream (KENALOG) 0.1 % Apply 1 Application topically 2 (two) times daily as needed (eczema.).   UNABLE TO FIND Take 1 tablet by mouth 3 (three) times daily. Med Name: bone strength- plant based calcium   No facility-administered  encounter medications on file as of 03/22/2023.    Allergies (verified) Macrobid  [nitrofurantoin macrocrystal], Doxycycline, Pravastatin sodium, Amoxicillin-pot clavulanate, Augmentin [amoxicillin-pot clavulanate], Codeine, Methylphenidate hcl, Methylprednisolone, and Sulfamethoxazole-trimethoprim   History: Past Medical History:  Diagnosis Date   Allergy    Arthritis 07/2014   Right hand, prescribed prednisone-no longer taking. also Rt knww   Brain aneurysm    DVT (deep venous thrombosis) (HCC) 2015   very small; treated with compression and  heat, no other problems since   GERD (gastroesophageal reflux disease)    patient denies states she does have hx of peptic ulcer many years   Hepatitis    in 10 th grade  (not sure type- not hep C) - treated  no other problems   Hyperlipidemia    diet controlled   Memory loss    MVP (mitral valve prolapse)    no problems, MD told patient she could not hear it   OSA (obstructive sleep apnea) 10/08/2021   Osteoarthritis    Osteopenia    [facility]   PONV (postoperative nausea and vomiting)    with ear surgery only - No other problems with other surgeries   Precancerous lesion 12/2014   Treated with cream only   TGA (transient global amnesia)    Varicose veins    Vitamin D deficiency    Past Surgical History:  Procedure Laterality Date   APPENDECTOMY     BUNIONECTOMY WITH HAMMERTOE RECONSTRUCTION Left 04/22/2016   Procedure: MODIFIED MCBRIDE BUNIONECTOMY,  LEFT SECOND HAMMERTOE CORRECTION;  Surgeon: Toni Arthurs, MD;  Location: [facility];  Service: Orthopedics;  Laterality: Left;   COLONOSCOPY     07/2012 Juanda Chance - [facility]/father - polyps   colonoscopy with polypectomy  07/2012    hyperplastic;Dr Brodie   DILATION AND CURETTAGE OF UTERUS     DILATION AND CURETTAGE OF UTERUS N/A 09/03/2014   Procedure: DILATATION AND CURETTAGE;  Surgeon: Donovan Kail, MD;  Location: [facility];  Service: Gynecology;  Laterality: N/A;   ENDOVENOUS ABLATION SAPHENOUS VEIN W/ LASER Left 01/31/2014   EVLA  left greater saphenous vein  by Gretta Began MD   ENDOVENOUS ABLATION SAPHENOUS VEIN W/ LASER Right 03/14/2014   endovenous laser ablation right greater saphenous vein  by Gretta Began MD   FOOT ARTHRODESIS Left 04/22/2016   Procedure: LEFT FIRST TARSAL METATARSAL ARTHRODESIS;  Surgeon: Toni Arthurs, MD;  Location: [facility];  Service: Orthopedics;  Laterality: Left;   G 3 P 3     HYSTEROSCOPY WITH D & C N/A 03/25/2016   Procedure: DILATATION AND CURETTAGE  /HYSTEROSCOPY;  Surgeon: Marlow Baars, MD;  Location: [facility];  Service: Gynecology;  Laterality: N/A;  WITH ULTRASOUND GUIDANCE   left ear surgery     REPLACEMENT TOTAL KNEE Right 09/15/2022   aluisio   STAPEDECTOMY     TARSAL METATARSAL FUSION WITH WEIL OSTEOTOMY Left 04/22/2016   Procedure: 2-4 TARSAL METATARSAL FUSION WITH WEIL OSTEOTOMY;  Surgeon: Toni Arthurs, MD;  Location: [facility];  Service: Orthopedics;  Laterality: Left;   TUBAL LIGATION     Family History  Problem Relation Age of Onset   Pancreatic cancer Mother    Miscarriages / [place] Mother    Thyroid disease Mother        partial thyroidectomy ; S/P RAI   Heart attack Father        in early 32s   Colon cancer Father    Seizures Sister  Ovarian cancer Maternal Aunt    Breast cancer Paternal Aunt    Aneurysm Paternal Uncle        brain   Colon cancer Paternal Grandmother    Stroke Paternal Grandmother        > 65   Heart attack Paternal Grandfather        in 66s   Cancer Other        among paternal sibs (colon, breast, bone)   Diabetes Neg Hx    Stomach cancer Neg Hx    Ulcerative colitis Neg Hx    Esophageal cancer Neg Hx    Rectal cancer Neg Hx    Social History   Socioeconomic History   Marital status: Married    Spouse name: Kim Mclaughlin   Number of children: Not on file   Years of education: Not on file   Highest education level: Not on file  Occupational History   Occupation: retired  Tobacco Use   Smoking status: Former    Current packs/day: 0.00    Types: Cigarettes    Quit date: 04/27/1963    Years since quitting: 59.9   Smokeless tobacco: Never   Tobacco comments:    Smoked < 6 mos;< 1 pack total;quit 1965  Vaping Use   Vaping status: Never Used  Substance and Sexual Activity   Alcohol use: No   Drug use: No   Sexual activity: Yes    Birth control/protection: Post-menopausal  Other Topics Concern   Not on file  Social History Narrative   Lives with husband   Right  Handed   Drinks average 2-3 cups caffeine daily   Social Determinants of Health   Financial Resource Strain: Low Risk  (03/21/2023)   Overall Financial Resource Strain (CARDIA)    Difficulty of Paying Living Expenses: Not hard at all  Food Insecurity: No Food Insecurity (03/21/2023)   Hunger Vital Sign    Worried About Running Out of Food in the Last Year: Never true    Ran Out of Food in the Last Year: Never true  Transportation Needs: No Transportation Needs (03/21/2023)   PRAPARE - Administrator, Civil Service (Medical): No    Lack of Transportation (Non-Medical): No  Physical Activity: Insufficiently Active (03/21/2023)   Exercise Vital Sign    Days of Exercise per Week: 1 day    Minutes of Exercise per Session: 10 min  Stress: No Stress Concern Present (03/21/2023)   [employer] of Occupational Health - Occupational Stress Questionnaire    Feeling of Stress : Not at all  Social Connections: Unknown (03/21/2023)   Social Connection and Isolation Panel [NHANES]    Frequency of Communication with Friends and Family: Three times a week    Frequency of Social Gatherings with Friends and Family: Three times a week    Attends Religious Services: Not on file    Active Member of Clubs or Organizations: Yes    Attends Banker Meetings: More than 4 times per year    Marital Status: Married    Tobacco Counseling Counseling given: Not Answered Tobacco comments: Smoked < 6 mos;< 1 pack total;quit 1965   Clinical Intake:  Pre-visit preparation completed: Yes  Pain : No/denies pain     BMI - recorded: 25.33 Nutritional Status: BMI 25 -29 Overweight Nutritional Risks: None Diabetes: No  How often do you need to have someone help you when you read instructions, pamphlets, or other written materials from your doctor or pharmacy?: 1 -  Never  Interpreter Needed?: No  Information entered by :: Theresa Mulligan LPN   Activities of Daily Living     03/21/2023   11:31 AM  In your present state of health, do you have any difficulty performing the following activities:  Hearing? 1  Comment Hearing loss. Pending medical attention  Vision? 0  Difficulty concentrating or making decisions? 0  Walking or climbing stairs? 0  Dressing or bathing? 0  Doing errands, shopping? 0  Preparing Food and eating ? N  Using the Toilet? N  In the past six months, have you accidently leaked urine? N  Do you have problems with loss of bowel control? N  Managing your Medications? N  Managing your Finances? N  Housekeeping or managing your Housekeeping? N    Patient Care Team: Zola Button, Grayling Congress, DO as PCP - General (Family Medicine) Marlow Baars, MD as Consulting Physician (Obstetrics) Ranee Gosselin, MD as Consulting Physician (Orthopedic Surgery) Aris Lot, MD as Consulting Physician (Dermatology) Myrtie Neither Andreas Blower, MD as Consulting Physician (Gastroenterology)  Indicate any recent Medical Services you may have received from other than [facility] providers in the past year (date may be approximate).     Assessment:   This is a routine wellness examination for [place].  Hearing/Vision screen Hearing Screening - Comments:: Denies hearing difficulties   Vision Screening - Comments:: Wears rx glasses - up to date with routine eye exams with  [facility]   Goals Addressed               This Visit's Progress     Increase physical activity (pt-stated)        Drink more water.       Depression Screen    03/22/2023    2:10 PM 01/06/2023    1:50 PM 08/10/2022    2:35 PM 05/06/2022    8:55 AM 03/16/2022   12:36 PM 10/22/2021    9:44 AM 02/25/2021    9:49 AM  PHQ 2/9 Scores  PHQ - 2 Score 0 0 0 1 0 3 0  PHQ- 9 Score  0 0 3  3     Fall Risk    03/22/2023    2:00 PM 03/21/2023   11:31 AM 08/10/2022    2:35 PM 03/16/2022   12:35 PM 03/15/2022    2:07 PM  Fall Risk   Falls in the past year? 1 1 0 1 1  Number falls in past  yr: 0 0 0 0 0  Injury with Fall? 1 0 0 0 0  Comment Loss a tooth. Followed by medical attention      Risk for fall due to :    No Fall Risks   Follow up Falls prevention discussed  Falls evaluation completed Falls evaluation completed     MEDICARE RISK AT HOME: Medicare Risk at Home Any stairs in or around the home?: Yes If so, are there any without handrails?: Yes Home free of loose throw rugs in walkways, pet beds, electrical cords, etc?: No Adequate lighting in your home to reduce risk of falls?: Yes Life alert?: No Use of a cane, walker or w/c?: No Grab bars in the bathroom?: Yes Shower chair or bench in shower?: No Elevated toilet seat or a handicapped toilet?: Yes  TIMED UP AND GO:  Was the test performed?  No    Cognitive Function:    07/29/2016    2:37 PM 04/24/2015   10:16 AM  MMSE - Mini  Mental State Exam  Not completed:  --  Orientation to time 5   Orientation to Place 5   Registration 3   Attention/ Calculation 5   Recall 3   Language- name 2 objects 2   Language- repeat 1   Language- follow 3 step command 3   Language- read & follow direction 1   Write a sentence 1   Copy design 1   Total score 30         03/22/2023    2:02 PM  6CIT Screen  What Year? 0 points  What month? 0 points  What time? 0 points  Count back from 20 0 points  Months in reverse 0 points  Repeat phrase 0 points  Total Score 0 points    Immunizations Immunization History  Administered Date(s) Administered   Fluad Quad(high Dose 65+) 01/17/2019, 01/23/2021, 03/19/2022   Influenza Whole 05/16/2012   Influenza, High Dose Seasonal PF 02/21/2014, 01/19/2016, 01/28/2018, 02/09/2021   Influenza,inj,quad, With Preservative 02/01/2017   Influenza-Unspecified 01/29/2015, 02/01/2017, 12/25/2017, 01/25/2019   Pneumococcal Conjugate-13 01/08/2015   Pneumococcal Polysaccharide-23 06/11/2011, 04/27/2019   Td 01/25/2008   Tdap 06/27/2018   Zoster Recombinant(Shingrix) 08/02/2016,  10/01/2016   Zoster, Live 12/13/2012    TDAP status: Up to date  Flu Vaccine status: Up to date   Pneumococcal vaccine status: Up to date  Covid-19 vaccine status: Declined, Education has been provided regarding the importance of this vaccine but patient still declined. Advised may receive this vaccine at local pharmacy or Health Dept.or vaccine clinic. Aware to provide a copy of the vaccination record if obtained from local pharmacy or Health Dept. Verbalized acceptance and understanding.  Qualifies for Shingles Vaccine? Yes   Zostavax completed Yes   Shingrix Completed?: Yes  Screening Tests Health Maintenance  Topic Date Due   COVID-19 Vaccine (1 - 2023-24 season) Never done   DEXA SCAN  08/08/2023   Medicare Annual Wellness (AWV)  03/21/2024   Colonoscopy  11/24/2025   DTaP/Tdap/Td (3 - Td or Tdap) 06/26/2028   Pneumonia Vaccine 20+ Years old  Completed   INFLUENZA VACCINE  Completed   Hepatitis C Screening  Completed   Zoster Vaccines- Shingrix  Completed   HPV VACCINES  Aged Out    Health Maintenance  Health Maintenance Due  Topic Date Due   COVID-19 Vaccine (1 - 2023-24 season) Never done    Colorectal cancer screening: Type of screening: Colonoscopy. Completed 11/24/20. Repeat every 5 years    Bone Density status: Completed 08/07/21. Results reflect: Bone density results: OSTEOPOROSIS. Repeat every   years.     Additional Screening:  Hepatitis C Screening: does qualify; Completed 06/30/15  Vision Screening: Recommended annual ophthalmology exams for early detection of glaucoma and other disorders of the eye. Is the patient up to date with their annual eye exam?  Yes  Who is the provider or what is the name of the office in which the patient attends annual eye exams? [facility] If pt is not established with a provider, would they like to be referred to a provider to establish care? No .   Dental Screening: Recommended annual dental exams for proper oral  hygiene    Community Resource Referral / Chronic Care Management:  CRR required this visit?  No   CCM required this visit?  No     Plan:     I have personally reviewed and noted the following in the patient's chart:   Medical and social history Use  of alcohol, tobacco or illicit drugs  Current medications and supplements including opioid prescriptions. Patient is not currently taking opioid prescriptions. Functional ability and status Nutritional status Physical activity Advanced directives List of other physicians Hospitalizations, surgeries, and ER visits in previous 12 months Vitals Screenings to include cognitive, depression, and falls Referrals and appointments  In addition, I have reviewed and discussed with patient certain preventive protocols, quality metrics, and best practice recommendations. A written personalized care plan for preventive services as well as general preventive health recommendations were provided to patient.     Tillie Rung, LPN   [id number]/01/[id number]   After Visit Summary: (MyChart) Due to this being a telephonic visit, the after visit summary with patients personalized plan was offered to patient via MyChart   Nurse Notes: None

## 2023-06-08 ENCOUNTER — Encounter: Payer: Self-pay | Admitting: Gastroenterology

## 2023-06-30 ENCOUNTER — Encounter: Payer: Self-pay | Admitting: Neurology

## 2023-06-30 ENCOUNTER — Ambulatory Visit (INDEPENDENT_AMBULATORY_CARE_PROVIDER_SITE_OTHER): Payer: Medicare PPO | Admitting: Neurology

## 2023-06-30 VITALS — BP 124/70 | HR 78 | Ht 63.0 in | Wt 148.0 lb

## 2023-06-30 DIAGNOSIS — G3184 Mild cognitive impairment, so stated: Secondary | ICD-10-CM

## 2023-06-30 DIAGNOSIS — R413 Other amnesia: Secondary | ICD-10-CM

## 2023-06-30 NOTE — Patient Instructions (Signed)
 I had a long discussion with the patient and her husband regarding her prior episodes of recurrent transient global amnesia's and mild cognitive impairment which appears stable.  She was advised to take aspirin 81 mg daily for stroke prevention and maintain aggressive risk factor modification with blood pressure goal below 140/90 with LDL cholesterol goal below 70 mg percent.  I encouraged her to continue participation in cognitively challenging activities like solving crossword puzzles, playing bridge and sudoku..  We also discussed memory compensation strategies.  Check screening carotid ultrasound.  Return for follow-up in the future only as needed..  Memory Compensation Strategies  Use "WARM" strategy.  W= write it down  A= associate it  R= repeat it  M= make a mental note  2.   You can keep a Glass blower/designer.  Use a 3-ring notebook with sections for the following: calendar, important names and phone numbers,  medications, doctors' names/phone numbers, lists/reminders, and a section to journal what you did  each day.   3.    Use a calendar to write appointments down.  4.    Write yourself a schedule for the day.  This can be placed on the calendar or in a separate section of the Memory Notebook.  Keeping a  regular schedule can help memory.  5.    Use medication organizer with sections for each day or morning/evening pills.  You may need help loading it  6.    Keep a basket, or pegboard by the door.  Place items that you need to take out with you in the basket or on the pegboard.  You may also want to  include a message board for reminders.  7.    Use sticky notes.  Place sticky notes with reminders in a place where the task is performed.  For example: " turn off the  stove" placed by the stove, "lock the door" placed on the door at eye level, " take your medications" on  the bathroom mirror or by the place where you normally take your medications.  8.    Use alarms/timers.  Use while  cooking to remind yourself to check on food or as a reminder to take your medicine, or as a  reminder to make a call, or as a reminder to perform another task, etc.

## 2023-06-30 NOTE — Progress Notes (Signed)
 [name] [address] [place]. [place] [address] [phone]       OFFICE FOLLOW UP VISIT NOTE  Ms. PAMULA LUTHER Date of Birth:  06-Jan-1946 Medical Record Number:  [id number]   Referring MD: Loreen Freud  Reason for Referral: Memory loss HPI: Initial visit 07/02/2020 :Ms. Cristiano is a pleasant 48 Caucasian lady seen today for initial office consultation visit for episode of memory loss.  History is obtained from the patient and her husband as well as review of electronic medical records and I personally reviewed pertinent imaging films in PACS.  She has past medical history for hyperlipidemia, gastroesophageal reflux disease and DVT.  She presented on 05/01/2020 with an episode of sudden onset of confusion and short-term memory difficulties.  She woke up that day from his usual state of health and drove to her appointment.  After that she apparently went to [place] area to run some errands but around 2 PM she got a phone call from her sister who noted that the patient appeared to be confused she could not remember that her older sister was sick.  She also said that she was wandering around and could not find her car.  Patient's husband subsequently came there and noticed that the patient had poor short-term memory and kept on asking the same questions over again.  She did report a mild headache.  She was taken to hospital with she still does not remember the details of the work-up.  CT scan was unremarkable and CT angiogram showed 2 to 3 mm dilatation of supraclinoid ICAs bilaterally possible infundibulum was a small aneurysm.  MRI scan was negative for any acute abnormalities.  EEG was obtained which was normal without any seizure activity.  Patient's condition improved after she was hospitalized in she started remembering new information since hospitalization but still could not remember for 2 hours.  That afternoon.  Patient states she has done well since  then.  She said no problems making new memories after discharge but still has very patchy memory of that afternoon to our.  She has no prior history of seizures, significant head injury, loss of consciousness, strokes, TIA.  She denies being under significant stress for this.  She did see Dr. Maisie Fus neurosurgeon as an outpatient who recommended conservative follow-up for her 2 to 3 mm supraclinoid Immuzim versus infundibulum.  I do not have those records for my review today. Update 10/22/2020 : She returns for follow-up after last visit 3 months ago.  She is accompanied by her husband.  She states she is doing well and has not had any further episodes of memory loss or confusion.  She had EEG done on 07/31/2020 which was normal.  She remains on aspirin is tolerating well without bruising or bleeding and Lipitor which she is tolerating without muscle aches and pains.  Primary care physician recently added co-Q10 to reduce side effects.  Patient is fully independent in activities of daily living.  She has had no new health concerns.  She has no neurological complaints. Update 12/30/2021 ; she returns for follow-up today after last visit with me in June 2022.  She is accompanied by her husband.  Another recurrent episode of transient global amnesia in 23.  09/30/2021 he had a hairdressers appointment where she was noted to be confused and disoriented and had no memory of how she got there.  She had spoken to her sister but did not remember that.  Her husband states that  she has forgotten some of the things that she has done over the past couple of days as well.  Her husband received a call from the hairdresser saying that she was confused, and when he went and found her, she did not remember multiple events from this morning.  She has since been repeating the same questions over and over again, he estimates 15-20 times that she has asked him why she is in the hospital.  Dr. Amada Jupiter introduced herself to the patient and  spoke to her briefly stepped out of the room to answer the page return patient has no memory of having met him.  CT scan of the head was obtained which showed no acute abnormality.  EEG was normal without epileptiform activity.  Lab work obtained included comprehensive metabolic panel, CBC, urinalysis, vitamin B12 and TSH were all normal.  LDL cholesterol was optimal at 43 mg percent.  Patient was discharged and advised to follow-up with me.  She states she still cannot remember any events from that morning a couple of hours.  The last thing she remembers is going to hairdresser but cannot recall what happened the or how she went to the hospital.  She remembers later event of her hospitalization still has no memory for about 1 hour time this episode.  This episode is quite similar to the one she had an January 2022 but the current episode was longer than the previous one.  Patient denies any history of migraine headaches but does admit to having significant gas in her life due to marital discord.  Patient does of Lipitor was increased to 80 mg by primary care physician but the question is questioning if she needs such a high dose .she has no prior history of progressive memory or cognitive difficulties.  Independent in all activities of daily living.  She denies any headaches other neurological complaints.  There is no family history of Alzheimer's, memory loss. Update 06/30/2022 : She returns for follow-up after last visit 6 months ago.  She is accompanied by her husband.  She continues to do well.  She has had no recurrent episodes of transient global amnesia, memory loss, stroke TIA or any other neurological complaints.  Remains fully independent in all activities of daily.  She had EEG done on 06/03/2022 which was normal.  MRI scan of the brain on 01/13/2022 was also unremarkable.  MR angiogram of the brain did not show definite aneurysm shows mild sclerotic changes in the left vertebral V4 segment.  And distal  posterior cerebral arteries he has no new complaints today.  He did quite fine on cognitive testing today. Update 06/30/2023 : She returns for follow-up after last visit a year ago.  She is accompanied by her husband.  She continues to do well.  She has not had any further recurrent episodes of transient global amnesia, memory loss, stroke or TIA.  She continues to have mild memory and cognitive difficulties but these appear to be unchanged.  She remains fully independent in all activities of daily living.  She does participate in playing games with her husband as well as online activities to keep self busy.  She is learning to do crochet.  She had knee replacement in May which went very well.  She has no complaints today.  Last lipid profile on 01/06/2023 showed LDL-cholesterol to be optimal at 41 mg percent.  She is tolerating aspirin well without bruising or bleeding and Lipitor without muscle aches and pains. ROS:  14 system review of systems is positive for memory loss, confusion, disorientation and all other systems negative  PMH:  Past Medical History:  Diagnosis Date   Allergy    Arthritis 07/2014   Right hand, prescribed prednisone-no longer taking. also Rt knww   Brain aneurysm    DVT (deep venous thrombosis) (HCC) 2015   very small; treated with compression and heat, no other problems since   GERD (gastroesophageal reflux disease)    patient denies states she does have hx of peptic ulcer many years   Hepatitis    in 10 th grade  (not sure type- not hep C) - treated  no other problems   Hyperlipidemia    diet controlled   Memory loss    MVP (mitral valve prolapse)    no problems, MD told patient she could not hear it   OSA (obstructive sleep apnea) 10/08/2021   Osteoarthritis    Osteopenia    [facility]   PONV (postoperative nausea and vomiting)    with ear surgery only - No other problems with other surgeries   Precancerous lesion 12/2014   Treated with cream only   TGA (transient  global amnesia)    Varicose veins    Vitamin D deficiency     Social History:  Social History   Socioeconomic History   Marital status: Married    Spouse name: Dorene Sorrow   Number of children: Not on file   Years of education: Not on file   Highest education level: Not on file  Occupational History   Occupation: retired  Tobacco Use   Smoking status: Former    Current packs/day: 0.00    Types: Cigarettes    Quit date: 04/27/1963    Years since quitting: 60.2   Smokeless tobacco: Never   Tobacco comments:    Smoked < 6 mos;< 1 pack total;quit 1965  Vaping Use   Vaping status: Never Used  Substance and Sexual Activity   Alcohol use: No   Drug use: No   Sexual activity: Yes    Birth control/protection: Post-menopausal  Other Topics Concern   Not on file  Social History Narrative   Lives with husband   Right Handed   Drinks average 2-3 cups caffeine daily   Retired    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 Corporate investment banker Strain: Low Risk  (03/21/2023)   Overall Financial Resource Strain (CARDIA)    Difficulty of Paying Living Expenses: Not hard at all  Food Insecurity: No Food Insecurity (03/21/2023)   Hunger Vital Sign    Worried About Running Out of Food in the Last Year: Never true    Ran Out of Food in the Last Year: Never true  Transportation Needs: No Transportation Needs (03/21/2023)   PRAPARE - Administrator, Civil Service (Medical): No    Lack of Transportation (Non-Medical): No  Physical Activity: Insufficiently Active (03/21/2023)   Exercise Vital Sign    Days of Exercise per Week: 1 day    Minutes of Exercise per Session: 10 min  Stress: No Stress Concern Present (03/21/2023)   [employer] of Occupational Health - Occupational Stress Questionnaire    Feeling of Stress : Not at all  Social Connections: Unknown (03/21/2023)   Social Connection and Isolation Panel [NHANES]    Frequency of Communication with Friends and Family: Three times a week     Frequency of Social Gatherings with Friends and Family: Three times a week    Attends Religious  Services: Not on file    Active Member of Clubs or Organizations: Yes    Attends Club or Organization Meetings: More than 4 times per year    Marital Status: Married  Intimate Partner Violence: Not At Risk (03/22/2023)   Humiliation, Afraid, Rape, and Kick questionnaire    Fear of Current or Ex-Partner: No    Emotionally Abused: No    Physically Abused: No    Sexually Abused: No    Medications:   Current Outpatient Medications on File Prior to Visit  Medication Sig Dispense Refill   Ascorbic Acid (VITA-C PO) Take by mouth.     aspirin EC 81 MG tablet Take 1 tablet (81 mg total) by mouth daily. Swallow whole. 30 tablet 12   atorvastatin (LIPITOR) 40 MG tablet TAKE 1 TABLET BY MOUTH EVERY DAY 90 tablet 1   Boswellia-Glucosamine-Vit D (OSTEO BI-FLEX ONE PER DAY PO) Take 1 tablet by mouth 2 (two) times daily.     cetirizine (ZYRTEC) 10 MG tablet Take 10 mg by mouth daily as needed for allergies.     Cyanocobalamin (B-12 PO) Take by mouth.     Glucosamine HCl-MSM (MSM GLUCOSAMINE PO) Take 2 tablets by mouth daily.     hydrocortisone cream 1 % Apply to affected area 2 times daily 15 g 0   MAGNESIUM PO Take 1 capsule by mouth daily.     Multiple Vitamins-Minerals (MULTIVITAMIN ADULTS 50+) TABS Take 1 tablet by mouth daily.     Multiple Vitamins-Minerals (ZINC PO) Take by mouth.     Omega-3 Fatty Acids (FISH OIL PO) Take 1 capsule by mouth daily.     triamcinolone (NASACORT) 55 MCG/ACT AERO nasal inhaler Place 2 sprays into the nose daily as needed (allergies).      triamcinolone cream (KENALOG) 0.1 % Apply 1 Application topically 2 (two) times daily as needed (eczema.). 30 g 1   UNABLE TO FIND Take 1 tablet by mouth 3 (three) times daily. Med Name: bone strength- plant based calcium     No current facility-administered medications on file prior to visit.    Allergies:   Allergies   Allergen Reactions   Macrobid  [Nitrofurantoin Macrocrystal] Other (See Comments)   Doxycycline Hives   Pravastatin Sodium     REACTION: shin pain   Amoxicillin-Pot Clavulanate     GI symptoms; abdominal pain & nausea NOTE: able to take Ampicillin   Augmentin [Amoxicillin-Pot Clavulanate] Nausea Only    Upsets stomach   Codeine Nausea Only   Methylphenidate Hcl Palpitations   Methylprednisolone Rash    Rash and redness on face   Sulfamethoxazole-Trimethoprim Nausea Only     nausea    Physical Exam General: Frail elderly Caucasian lady seated, in no evident distress Head: head normocephalic and atraumatic.   Neck: supple with no carotid or supraclavicular bruits Cardiovascular: regular rate and rhythm, no murmurs Musculoskeletal: no deformity Skin:  no rash/petichiae Vascular:  Normal pulses all extremities  Neurologic Exam Mental Status: Awake and fully alert. Oriented to place and time. Recent and remote memory intact. Attention span, concentration and fund of knowledge appropriate. Mood and affect appropriate.  Recall 3/3.  Able to name 15 animals that can walk on 4 legs.  Clock drawing 4/4.  Able to name 17 animals which can walk on 4 legs. Cranial Nerves: Fundoscopic exam not done. Pupils equal, briskly reactive to light. Extraocular movements full without nystagmus. Visual fields full to confrontation. Hearing intact. Facial sensation intact. Face, tongue, palate moves normally  and symmetrically.  Motor: Normal bulk and tone. Normal strength in all tested extremity muscles. Sensory.: intact to touch , pinprick , position and vibratory sensation.  Coordination: Rapid alternating movements normal in all extremities. Finger-to-nose and heel-to-shin performed accurately bilaterally. Gait and Station: Arises from chair without difficulty. Stance is normal. Gait demonstrates normal stride length and balance . Able to heel, toe and tandem walk with moderate difficulty.  Reflexes: 1+  and symmetric. Toes downgoing.       ASSESSMENT: 78 year old Caucasian lady with transient episode of memory loss and confusion likely due to transient global amnesia in January 2022 with now recurrent episodes again in June 2023.  Incidental finding of 2 to 3 mm bilateral supraclinoid aneurysm versus infundibulum .  She also has mild cognitive impairment which appears stable     PLAN: I had a long discussion with the patient and her husband regarding her prior episodes of recurrent transient global amnesia's and mild cognitive impairment which appears stable.  She was advised to take aspirin 81 mg daily for stroke prevention and maintain aggressive risk factor modification with blood pressure goal below 140/90 with LDL cholesterol goal below 70 mg percent.  I encouraged her to continue participation in cognitively challenging activities like solving crossword puzzles, playing bridge and sudoku..  We also discussed memory compensation strategies.  Check screening carotid ultrasound.  Return for follow-up in the future only as needed..  Greater than 50% time during this 35-minute  visit was spent on counseling and coordination of care about her episode of memory loss and asymptomatic aneurysm versus infundibulum discussion and answering questions. Delia Heady, MD Note: This document was prepared with digital dictation and possible smart phrase technology. Any transcriptional errors that result from this process are unintentional.

## 2023-07-16 ENCOUNTER — Other Ambulatory Visit: Payer: Self-pay | Admitting: Family Medicine

## 2023-07-16 DIAGNOSIS — E785 Hyperlipidemia, unspecified: Secondary | ICD-10-CM

## 2023-07-19 ENCOUNTER — Ambulatory Visit (HOSPITAL_COMMUNITY)
Admission: RE | Admit: 2023-07-19 | Discharge: 2023-07-19 | Disposition: A | Discharge status: Home / Self Care | Source: Ambulatory Visit | Attending: Cardiology | Admitting: Cardiology

## 2023-07-19 DIAGNOSIS — G3184 Mild cognitive impairment, so stated: Secondary | ICD-10-CM | POA: Diagnosis not present

## 2023-07-19 DIAGNOSIS — G454 Transient global amnesia: Secondary | ICD-10-CM | POA: Diagnosis not present

## 2023-07-28 NOTE — Progress Notes (Signed)
Kindly inform the patient that carotid ultrasound study shows no significant narrowing of either carotid artery in the neck

## 2023-09-03 IMAGING — CT CT HEAD W/O CM
3 series · 14 of 46 positions shown, 16 images · non-contrast
Comparison: CT head 05/01/2020.

CLINICAL DATA: Mental status change, unknown cause



[Series 2: head wo · axial · 0.47mm/px · z∈[-145,-25]mm · 8 of 29 slices shown, 10 images]
[im 3/29  brain]
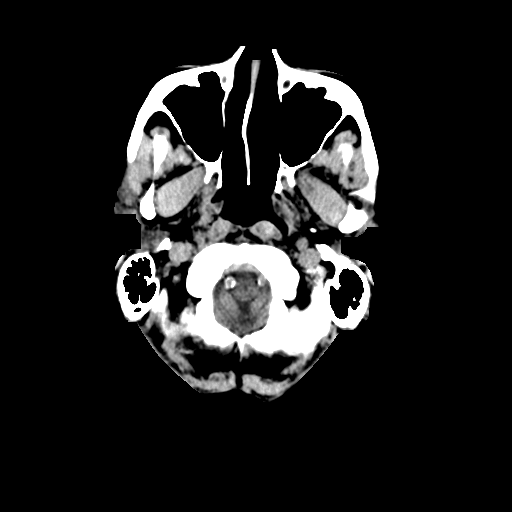
[im 3/29  bone]
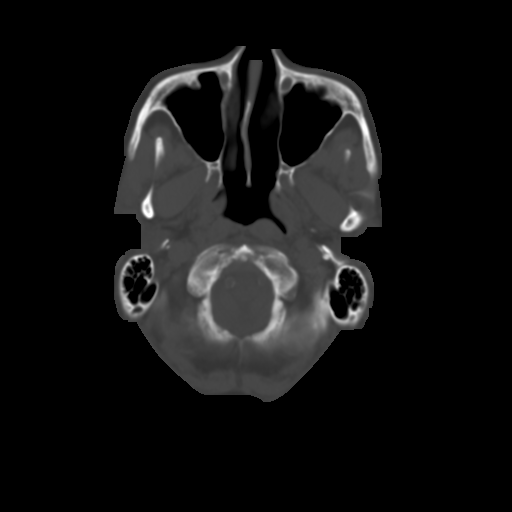
[im 7/29  brain]
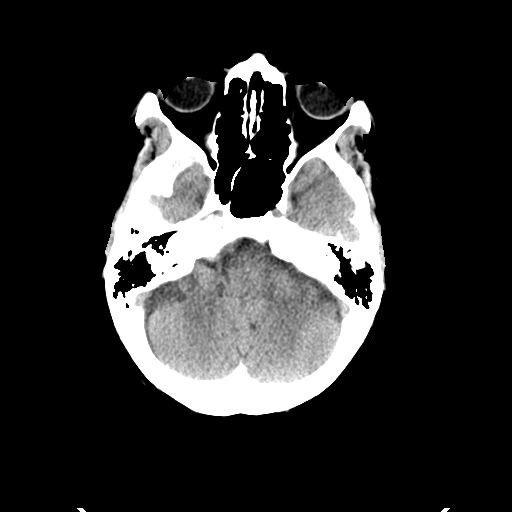
[im 10/29  brain]
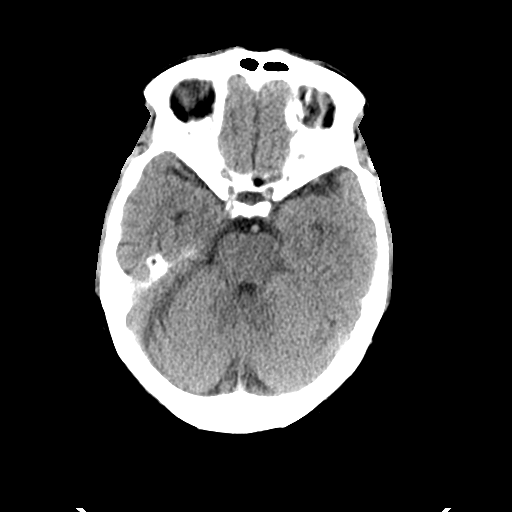
[im 13/29  brain]
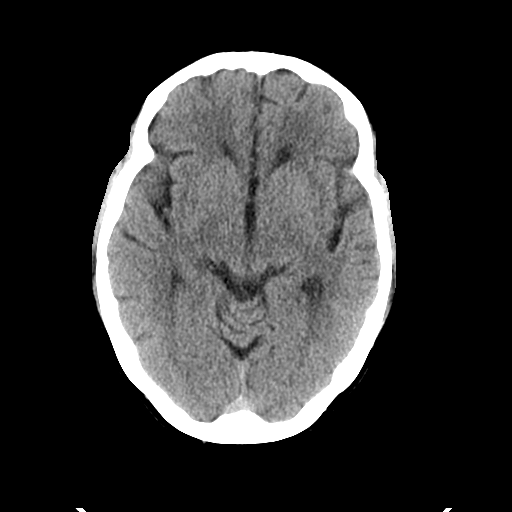
[im 17/29  brain]
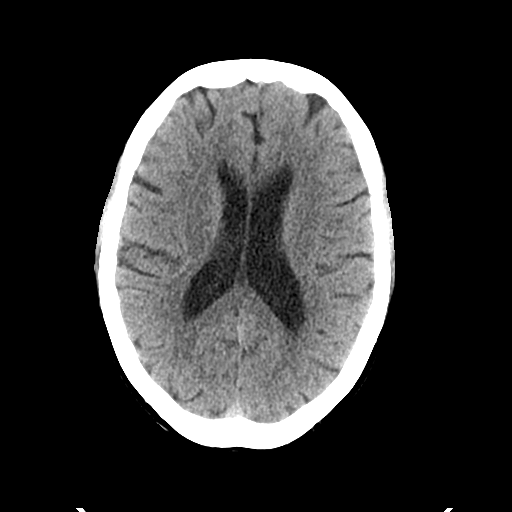
[im 17/29  bone]
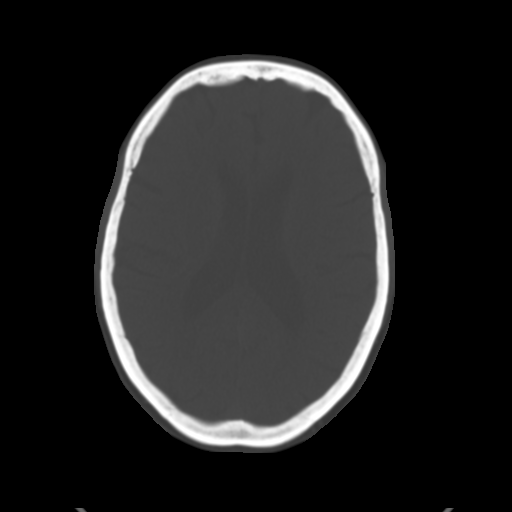
[im 20/29  brain]
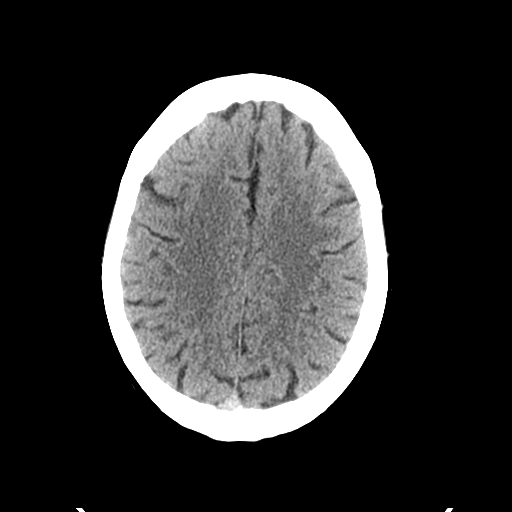
[im 23/29  brain]
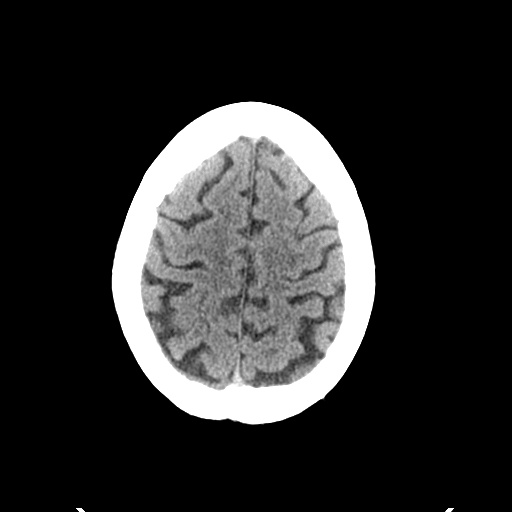
[im 27/29  brain]
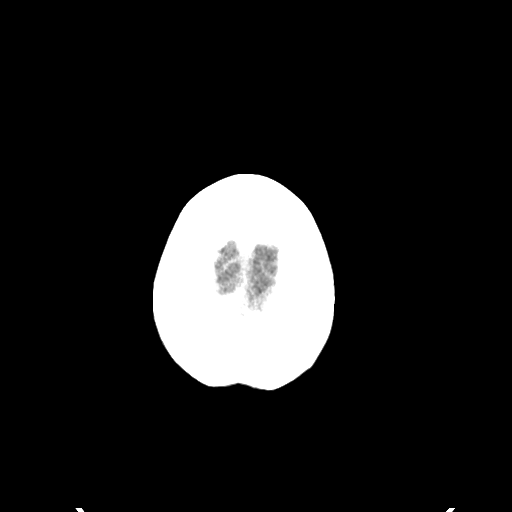

[Series 5: coronal soft tissue · coronal · 0.29mm/px · 3 of 72 slices shown]
[im 24/72  brain]
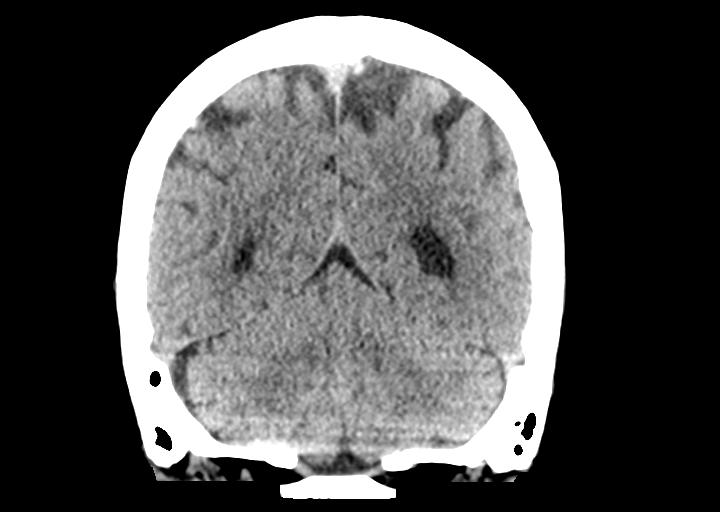
[im 32/72  brain]
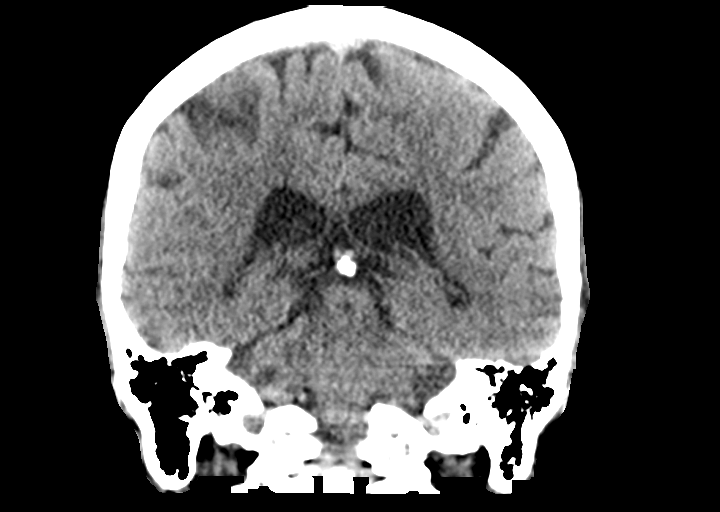
[im 40/72  brain]
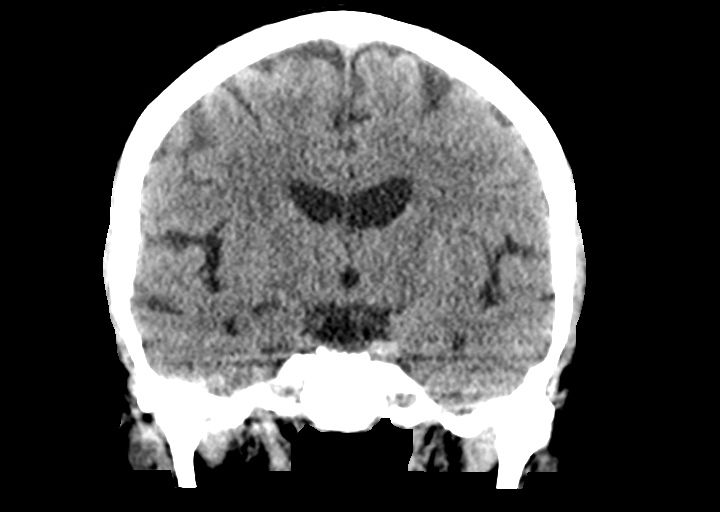

[Series 6: sagittal soft tissue · sagittal · 0.28mm/px · 3 of 59 slices shown]
[im 20/59  brain]
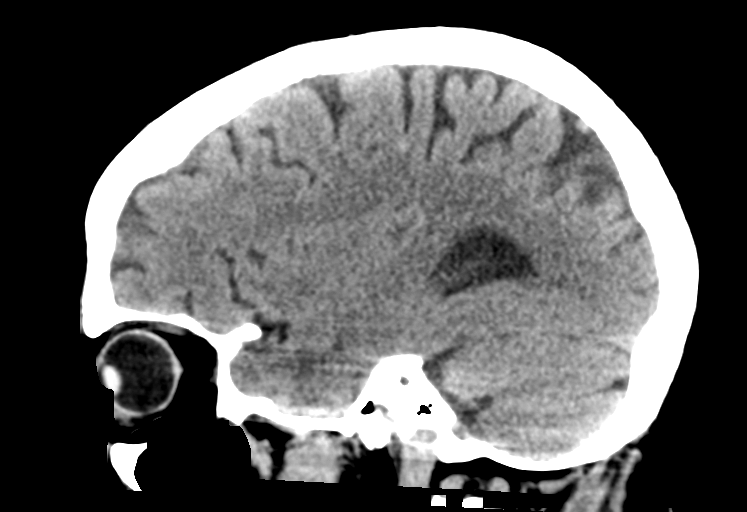
[im 30/59  brain]
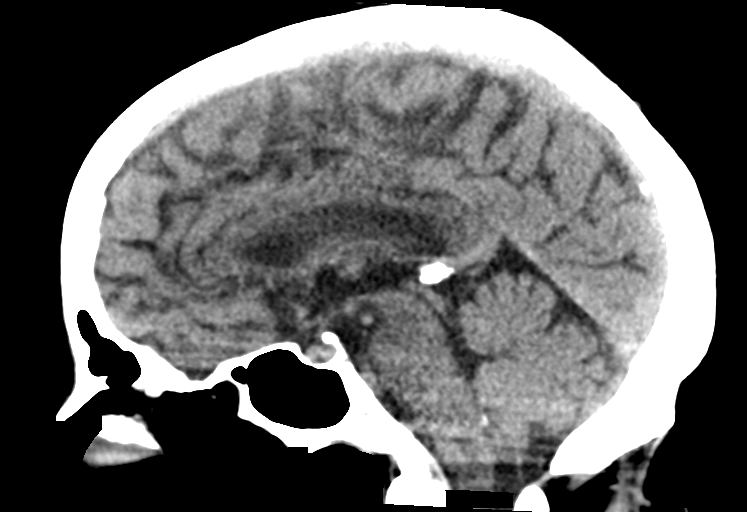
[im 39/59  brain]
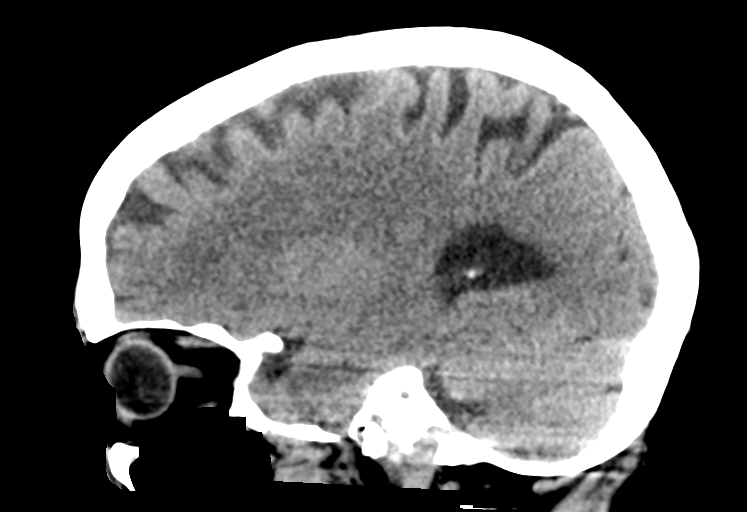

[14 of 46 positions shown; findings below may reference images not displayed]

FINDINGS: Brain: No evidence of acute infarction, hemorrhage, hydrocephalus,
extra-axial collection or mass lesion/mass effect.

Vascular: No hyperdense vessel identified.

Skull: No acute fracture.

Sinuses/Orbits: Clear visualized sinuses. No acute orbital findings.

Other: No mastoid effusions.
IMPRESSION: No evidence of acute intracranial abnormality.

## 2023-09-16 DIAGNOSIS — Z96651 Presence of right artificial knee joint: Secondary | ICD-10-CM | POA: Diagnosis not present

## 2023-09-27 ENCOUNTER — Encounter: Payer: Self-pay | Admitting: Family Medicine

## 2023-11-03 DIAGNOSIS — L820 Inflamed seborrheic keratosis: Secondary | ICD-10-CM | POA: Diagnosis not present

## 2023-11-03 DIAGNOSIS — L309 Dermatitis, unspecified: Secondary | ICD-10-CM | POA: Diagnosis not present

## 2023-12-14 ENCOUNTER — Encounter: Payer: Self-pay | Admitting: Family Medicine

## 2023-12-30 DIAGNOSIS — D225 Melanocytic nevi of trunk: Secondary | ICD-10-CM | POA: Diagnosis not present

## 2023-12-30 DIAGNOSIS — L918 Other hypertrophic disorders of the skin: Secondary | ICD-10-CM | POA: Diagnosis not present

## 2023-12-30 DIAGNOSIS — L821 Other seborrheic keratosis: Secondary | ICD-10-CM | POA: Diagnosis not present

## 2023-12-30 DIAGNOSIS — L814 Other melanin hyperpigmentation: Secondary | ICD-10-CM | POA: Diagnosis not present

## 2023-12-30 DIAGNOSIS — D692 Other nonthrombocytopenic purpura: Secondary | ICD-10-CM | POA: Diagnosis not present

## 2024-01-09 ENCOUNTER — Encounter: Admitting: Family Medicine

## 2024-01-10 ENCOUNTER — Encounter: Payer: Self-pay | Admitting: Family Medicine

## 2024-01-10 ENCOUNTER — Ambulatory Visit (INDEPENDENT_AMBULATORY_CARE_PROVIDER_SITE_OTHER): Admitting: Family Medicine

## 2024-01-10 VITALS — BP 136/80 | HR 74 | Temp 98.0°F | Resp 16 | Ht 63.0 in | Wt 148.4 lb

## 2024-01-10 DIAGNOSIS — F418 Other specified anxiety disorders: Secondary | ICD-10-CM

## 2024-01-10 DIAGNOSIS — F321 Major depressive disorder, single episode, moderate: Secondary | ICD-10-CM

## 2024-01-10 DIAGNOSIS — Z0000 Encounter for general adult medical examination without abnormal findings: Secondary | ICD-10-CM | POA: Diagnosis not present

## 2024-01-10 DIAGNOSIS — E538 Deficiency of other specified B group vitamins: Secondary | ICD-10-CM | POA: Diagnosis not present

## 2024-01-10 DIAGNOSIS — M8000XA Age-related osteoporosis with current pathological fracture, unspecified site, initial encounter for fracture: Secondary | ICD-10-CM | POA: Diagnosis not present

## 2024-01-10 DIAGNOSIS — E782 Mixed hyperlipidemia: Secondary | ICD-10-CM | POA: Diagnosis not present

## 2024-01-10 DIAGNOSIS — E785 Hyperlipidemia, unspecified: Secondary | ICD-10-CM | POA: Diagnosis not present

## 2024-01-10 DIAGNOSIS — R739 Hyperglycemia, unspecified: Secondary | ICD-10-CM | POA: Diagnosis not present

## 2024-01-10 LAB — CBC WITH DIFFERENTIAL/PLATELET
Basophils Absolute: 0.1 K/uL (ref 0.0–0.1)
Basophils Relative: 1.2 % (ref 0.0–3.0)
Eosinophils Absolute: 0.3 K/uL (ref 0.0–0.7)
Eosinophils Relative: 5.6 % — ABNORMAL HIGH (ref 0.0–5.0)
HCT: 41.9 % (ref 36.0–46.0)
Hemoglobin: 14.1 g/dL (ref 12.0–15.0)
Lymphocytes Relative: 30.3 % (ref 12.0–46.0)
Lymphs Abs: 1.7 K/uL (ref 0.7–4.0)
MCHC: 33.7 g/dL (ref 30.0–36.0)
MCV: 92.4 fl (ref 78.0–100.0)
Monocytes Absolute: 0.5 K/uL (ref 0.1–1.0)
Monocytes Relative: 8.8 % (ref 3.0–12.0)
Neutro Abs: 3.1 K/uL (ref 1.4–7.7)
Neutrophils Relative %: 54.1 % (ref 43.0–77.0)
Platelets: 301 K/uL (ref 150.0–400.0)
RBC: 4.53 Mil/uL (ref 3.87–5.11)
RDW: 13.7 % (ref 11.5–15.5)
WBC: 5.7 K/uL (ref 4.0–10.5)

## 2024-01-10 LAB — LIPID PANEL
Cholesterol: 156 mg/dL (ref 0–200)
HDL: 79.4 mg/dL (ref >39.00)
LDL Cholesterol: 65 mg/dL (ref 0–99)
NonHDL: 77.05
Total CHOL/HDL Ratio: 2
Triglycerides: 60 mg/dL (ref 0.0–149.0)
VLDL: 12 mg/dL (ref 0.0–40.0)

## 2024-01-10 LAB — COMPREHENSIVE METABOLIC PANEL WITH GFR
ALT: 19 U/L (ref 0–35)
AST: 21 U/L (ref 0–37)
Albumin: 4.3 g/dL (ref 3.5–5.2)
Alkaline Phosphatase: 85 U/L (ref 39–117)
BUN: 16 mg/dL (ref 6–23)
CO2: 27 meq/L (ref 19–32)
Calcium: 10.2 mg/dL (ref 8.4–10.5)
Chloride: 109 meq/L (ref 96–112)
Creatinine, Ser: 0.79 mg/dL (ref 0.40–1.20)
GFR: 71.53 mL/min (ref >60.00)
Glucose, Bld: 92 mg/dL (ref 70–99)
Potassium: 4.4 meq/L (ref 3.5–5.1)
Sodium: 137 meq/L (ref 135–145)
Total Bilirubin: 1 mg/dL (ref 0.2–1.2)
Total Protein: 6.9 g/dL (ref 6.0–8.3)

## 2024-01-10 LAB — VITAMIN B12: Vitamin B-12: 553 pg/mL (ref 211–911)

## 2024-01-10 LAB — TSH: TSH: 1.26 u[IU]/mL (ref 0.35–5.50)

## 2024-01-10 NOTE — Assessment & Plan Note (Signed)
 Resolved

## 2024-01-10 NOTE — Assessment & Plan Note (Signed)
 Tolerating statin, encouraged heart healthy diet, avoid trans fats, minimize simple carbs and saturated fats. Increase exercise as tolerated

## 2024-01-10 NOTE — Progress Notes (Signed)
 Subjective:    Patient ID: Kim Mclaughlin, female    DOB: 1945/05/14, 78 y.o.   MRN: [id number]  Chief Complaint  Patient presents with   Annual Exam    Pt states fasting     HPI Patient is in today for cpe Discussed the use of AI scribe software for clinical note transcription with the patient, who gave verbal consent to proceed.  History of Present Illness Kim Mclaughlin is a 78 year old female who presents for an annual physical exam.  She is up to date with her flu and tetanus vaccinations but has not received a COVID vaccination. Her mammogram is scheduled for November or December, and she has not had a recent bone density scan, which was due in April. Her last colonoscopy was in 2027, and she questions the necessity of future colonoscopies given her age.  She expresses concern about her family history of cancer, noting that most of her father's 13 siblings died from various types of cancer, including one aunt with ovarian cancer. She inquires about genetic testing for cancer risk, mentioning that her OB GYN does not offer it, but she has seen it available elsewhere. She denies abdominal bloating and reports no symptoms when asked about signs of ovarian cancer.  Her social history includes having three great-grandchildren, one of whom lives in [place] due to her father's [employer]. She maintains communication with them via Signal and FaceTime. She also mentions that her sister had a cyst on her pancreas discovered during a different procedure and requires annual check-ups.   Past Medical History:  Diagnosis Date   Allergy    Arthritis 07/2014   Right hand, prescribed prednisone -no longer taking. also Rt knww   Brain aneurysm    DVT (deep venous thrombosis) (HCC) 2015   very small; treated with compression and heat, no other problems since   GERD (gastroesophageal reflux disease)    patient denies states she does have hx of peptic ulcer many years   Hepatitis     in 10 th grade  (not sure type- not hep C) - treated  no other problems   Hyperlipidemia    diet controlled   Memory loss    MVP (mitral valve prolapse)    no problems, MD told patient she could not hear it   OSA (obstructive sleep apnea) 10/08/2021   Osteoarthritis    Osteopenia    [facility]   PONV (postoperative nausea and vomiting)    with ear surgery only - No other problems with other surgeries   Precancerous lesion 12/2014   Treated with cream only   TGA (transient global amnesia)    Varicose veins    Vitamin D  deficiency     Past Surgical History:  Procedure Laterality Date   APPENDECTOMY     BUNIONECTOMY WITH HAMMERTOE RECONSTRUCTION Left 04/22/2016   Procedure: MODIFIED MCBRIDE BUNIONECTOMY,  LEFT SECOND HAMMERTOE CORRECTION;  Surgeon: Norleen Armor, MD;  Location: [facility];  Service: Orthopedics;  Laterality: Left;   COLONOSCOPY     07/2012 [facility] - [facility]/father - polyps   colonoscopy with polypectomy  07/2012    hyperplastic;Dr Brodie   DILATION AND CURETTAGE OF UTERUS     DILATION AND CURETTAGE OF UTERUS N/A 09/03/2014   Procedure: DILATATION AND CURETTAGE;  Surgeon: Dasie Gull, MD;  Location: [facility];  Service: Gynecology;  Laterality: N/A;   ENDOVENOUS ABLATION SAPHENOUS VEIN W/ LASER Left 01/31/2014   EVLA  left greater  saphenous vein  by Krystal Doing MD   ENDOVENOUS ABLATION SAPHENOUS VEIN W/ LASER Right 03/14/2014   endovenous laser ablation right greater saphenous vein  by Krystal Doing MD   FOOT ARTHRODESIS Left 04/22/2016   Procedure: LEFT FIRST TARSAL METATARSAL ARTHRODESIS;  Surgeon: Norleen Armor, MD;  Location: [facility];  Service: Orthopedics;  Laterality: Left;   G 3 P 3     HYSTEROSCOPY WITH D & C N/A 03/25/2016   Procedure: DILATATION AND CURETTAGE /HYSTEROSCOPY;  Surgeon: Jolene Gaskins, MD;  Location: [facility];  Service: Gynecology;  Laterality: N/A;  WITH ULTRASOUND GUIDANCE   left ear surgery     REPLACEMENT  TOTAL KNEE Right 09/15/2022   aluisio   right knee replacment      may 2024   STAPEDECTOMY     TARSAL METATARSAL FUSION WITH WEIL OSTEOTOMY Left 04/22/2016   Procedure: 2-4 TARSAL METATARSAL FUSION WITH WEIL OSTEOTOMY;  Surgeon: Norleen Armor, MD;  Location: [facility];  Service: Orthopedics;  Laterality: Left;   TUBAL LIGATION      Family History  Problem Relation Age of Onset   Pancreatic cancer Mother    Miscarriages / [place] Mother    Thyroid  disease Mother        partial thyroidectomy ; S/P RAI   Heart attack Father        in early 40s   Colon cancer Father    Seizures Sister    Ovarian cancer Maternal Aunt    Breast cancer Paternal Aunt    Aneurysm Paternal Uncle        brain   Colon cancer Paternal Grandmother    Stroke Paternal Grandmother        > 65   Heart attack Paternal Grandfather        in 26s   Cancer Other        among paternal sibs (colon, breast, bone)   Diabetes Neg Hx    Stomach cancer Neg Hx    Ulcerative colitis Neg Hx    Esophageal cancer Neg Hx    Rectal cancer Neg Hx     Social History   Socioeconomic History   Marital status: Married    Spouse name: Christopher   Number of children: Not on file   Years of education: Not on file   Highest education level: Not on file  Occupational History   Occupation: retired  Tobacco Use   Smoking status: Former    Current packs/day: 0.00    Types: Cigarettes    Quit date: 04/27/1963    Years since quitting: 60.7   Smokeless tobacco: Never   Tobacco comments:    Smoked < 6 mos;< 1 pack total;quit 1965  Vaping Use   Vaping status: Never Used  Substance and Sexual Activity   Alcohol use: No   Drug use: No   Sexual activity: Yes    Birth control/protection: Post-menopausal  Other Topics Concern   Not on file  Social History Narrative   Lives with husband   Right Handed   Drinks average 2-3 cups caffeine daily   Retired    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 Corporate investment banker Strain:  Low Risk  (03/21/2023)   Overall Financial Resource Strain (CARDIA)    Difficulty of Paying Living Expenses: Not hard at all  Food Insecurity: No Food Insecurity (03/21/2023)   Hunger Vital Sign    Worried About Running Out of Food in the Last Year: Never true  Ran Out of Food in the Last Year: Never true  Transportation Needs: No Transportation Needs (03/21/2023)   PRAPARE - Administrator, Civil Service (Medical): No    Lack of Transportation (Non-Medical): No  Physical Activity: Insufficiently Active (03/21/2023)   Exercise Vital Sign    Days of Exercise per Week: 1 day    Minutes of Exercise per Session: 10 min  Stress: No Stress Concern Present (03/21/2023)   [employer] of Occupational Health - Occupational Stress Questionnaire    Feeling of Stress : Not at all  Social Connections: Unknown (03/21/2023)   Social Connection and Isolation Panel    Frequency of Communication with Friends and Family: Three times a week    Frequency of Social Gatherings with Friends and Family: Three times a week    Attends Religious Services: Not on file    Active Member of Clubs or Organizations: Yes    Attends Club or Organization Meetings: More than 4 times per year    Marital Status: Married  Intimate Partner Violence: Not At Risk (03/22/2023)   Humiliation, Afraid, Rape, and Kick questionnaire    Fear of Current or Ex-Partner: No    Emotionally Abused: No    Physically Abused: No    Sexually Abused: No    Outpatient Medications Prior to Visit  Medication Sig Dispense Refill   Ascorbic Acid (VITA-C PO) Take by mouth.     aspirin  EC 81 MG tablet Take 1 tablet (81 mg total) by mouth daily. Swallow whole. 30 tablet 12   atorvastatin  (LIPITOR) 40 MG tablet TAKE 1 TABLET BY MOUTH EVERY DAY 90 tablet 1   Boswellia-Glucosamine-Vit D (OSTEO BI-FLEX ONE PER DAY PO) Take 1 tablet by mouth 2 (two) times daily.     cetirizine (ZYRTEC) 10 MG tablet Take 10 mg by mouth daily as  needed for allergies.     Cyanocobalamin  (B-12 PO) Take by mouth.     Glucosamine HCl-MSM (MSM GLUCOSAMINE PO) Take 2 tablets by mouth daily.     hydrocortisone  cream 1 % Apply to affected area 2 times daily 15 g 0   MAGNESIUM PO Take 1 capsule by mouth daily.     Multiple Vitamins-Minerals (MULTIVITAMIN ADULTS 50+) TABS Take 1 tablet by mouth daily.     Multiple Vitamins-Minerals (ZINC PO) Take by mouth.     Omega-3 Fatty Acids (FISH OIL PO) Take 1 capsule by mouth daily.     triamcinolone  (NASACORT ) 55 MCG/ACT AERO nasal inhaler Place 2 sprays into the nose daily as needed (allergies).      triamcinolone  cream (KENALOG ) 0.1 % Apply 1 Application topically 2 (two) times daily as needed (eczema.). 30 g 1   UNABLE TO FIND Take 1 tablet by mouth 3 (three) times daily. Med Name: bone strength- plant based calcium      No facility-administered medications prior to visit.    Allergies  Allergen Reactions   Macrobid   [Nitrofurantoin  Macrocrystal] Other (See Comments)   Doxycycline Hives   Pravastatin Sodium     REACTION: shin pain   Amoxicillin-Pot Clavulanate     GI symptoms; abdominal pain & nausea NOTE: able to take Ampicillin   Augmentin [Amoxicillin-Pot Clavulanate] Nausea Only    Upsets stomach   Codeine Nausea Only   Methylphenidate Hcl Palpitations   Methylprednisolone  Rash    Rash and redness on face   Sulfamethoxazole-Trimethoprim Nausea Only     nausea    Review of Systems  Constitutional:  Negative for chills, fever  and malaise/fatigue.  HENT:  Negative for congestion and hearing loss.   Eyes:  Negative for blurred vision and discharge.  Respiratory:  Negative for cough, sputum production and shortness of breath.   Cardiovascular:  Negative for chest pain, palpitations and leg swelling.  Gastrointestinal:  Negative for abdominal pain, blood in stool, constipation, diarrhea, heartburn, nausea and vomiting.  Genitourinary:  Negative for dysuria, frequency, hematuria and  urgency.  Musculoskeletal:  Negative for back pain, falls and myalgias.  Skin:  Negative for rash.  Neurological:  Negative for dizziness, sensory change, loss of consciousness, weakness and headaches.  Endo/Heme/Allergies:  Negative for environmental allergies. Does not bruise/bleed easily.  Psychiatric/Behavioral:  Negative for depression and suicidal ideas. The patient is not nervous/anxious and does not have insomnia.        Objective:    Physical Exam Vitals and nursing note reviewed.  Constitutional:      General: She is not in acute distress.    Appearance: Normal appearance. She is well-developed.  HENT:     Head: Normocephalic and atraumatic.  Eyes:     General: No scleral icterus.       Right eye: No discharge.        Left eye: No discharge.  Cardiovascular:     Rate and Rhythm: Normal rate and regular rhythm.     Heart sounds: No murmur heard. Pulmonary:     Effort: Pulmonary effort is normal. No respiratory distress.     Breath sounds: Normal breath sounds.  Musculoskeletal:        General: Normal range of motion.     Cervical back: Normal range of motion and neck supple.     Right lower leg: No edema.     Left lower leg: No edema.  Skin:    General: Skin is warm and dry.  Neurological:     Mental Status: She is alert and oriented to person, place, and time.  Psychiatric:        Mood and Affect: Mood normal.        Behavior: Behavior normal.        Thought Content: Thought content normal.        Judgment: Judgment normal.     BP 136/80 (BP Location: Left Arm, Patient Position: Sitting, Cuff Size: Normal)   Pulse 74   Temp 98 F (36.7 C) (Oral)   Resp 16   Ht 5' 3 (1.6 m)   Wt 148 lb 6.4 oz (67.3 kg)   SpO2 97%   BMI 26.29 kg/m  Wt Readings from Last 3 Encounters:  01/10/24 148 lb 6.4 oz (67.3 kg)  06/30/23 148 lb (67.1 kg)  03/22/23 143 lb (64.9 kg)    Diabetic Foot Exam - Simple   No data filed    Lab Results  Component Value Date   WBC  7.6 01/06/2023   HGB 13.1 01/06/2023   HCT 40.1 01/06/2023   PLT 346.0 01/06/2023   GLUCOSE 86 01/06/2023   CHOL 141 01/06/2023   TRIG 116.0 01/06/2023   HDL 77.20 01/06/2023   LDLDIRECT 188.0 08/08/2012   LDLCALC 41 01/06/2023   ALT 18 01/06/2023   AST 21 01/06/2023   NA 141 01/06/2023   K 4.0 01/06/2023   CL 104 01/06/2023   CREATININE 0.88 01/06/2023   BUN 16 01/06/2023   CO2 28 01/06/2023   TSH 1.82 01/06/2023   INR 1.0 05/01/2020   HGBA1C 5.7 (H) 05/02/2020    Lab Results  Component Value  Date   TSH 1.82 01/06/2023   Lab Results  Component Value Date   WBC 7.6 01/06/2023   HGB 13.1 01/06/2023   HCT 40.1 01/06/2023   MCV 92.7 01/06/2023   PLT 346.0 01/06/2023   Lab Results  Component Value Date   NA 141 01/06/2023   K 4.0 01/06/2023   CO2 28 01/06/2023   GLUCOSE 86 01/06/2023   BUN 16 01/06/2023   CREATININE 0.88 01/06/2023   BILITOT 0.6 01/06/2023   ALKPHOS 94 01/06/2023   AST 21 01/06/2023   ALT 18 01/06/2023   PROT 6.6 01/06/2023   ALBUMIN 3.9 01/06/2023   CALCIUM  10.2 01/06/2023   ANIONGAP 11 04/29/2022   EGFR 73 05/19/2022   GFR 63.29 01/06/2023   Lab Results  Component Value Date   CHOL 141 01/06/2023   Lab Results  Component Value Date   HDL 77.20 01/06/2023   Lab Results  Component Value Date   LDLCALC 41 01/06/2023   Lab Results  Component Value Date   TRIG 116.0 01/06/2023   Lab Results  Component Value Date   CHOLHDL 2 01/06/2023   Lab Results  Component Value Date   HGBA1C 5.7 (H) 05/02/2020       Assessment & Plan:  Preventative health care Assessment & Plan: Ghm utd Check labs  See AVS Health Maintenance  Topic Date Due   DEXA SCAN  08/08/2023   COVID-19 Vaccine (1 - 2024-25 season) 01/26/2024 (Originally 12/26/2023)   Medicare Annual Wellness (AWV)  03/21/2024   Colonoscopy  11/24/2025   DTaP/Tdap/Td (3 - Td or Tdap) 06/26/2028   Pneumococcal Vaccine: 50+ Years  Completed   Influenza Vaccine  Completed    Hepatitis C Screening  Completed   Zoster Vaccines- Shingrix  Completed   HPV VACCINES  Aged Out   Meningococcal B Vaccine  Aged Out   Mammogram  Discontinued      Hyperlipidemia, unspecified hyperlipidemia type Assessment & Plan: Tolerating statin, encouraged heart healthy diet, avoid trans fats, minimize simple carbs and saturated fats. Increase exercise as tolerated    Mixed hyperlipidemia Assessment & Plan: Tolerating statin, encouraged heart healthy diet, avoid trans fats, minimize simple carbs and saturated fats. Increase exercise as tolerated   Orders: -     CBC with Differential/Platelet -     Comprehensive metabolic panel with GFR -     Lipid panel  Depression with anxiety Assessment & Plan: Resolved   Orders: -     TSH  Hyperglycemia -     Comprehensive metabolic panel with GFR -     Lipid panel  Age-related osteoporosis with current pathological fracture, initial encounter -     DG Bone Density; Future  B12 deficiency -     Vitamin B12  Depression, major, single episode, moderate (HCC)  6 Assessment and Plan Assessment & Plan Routine Adult Wellness Visit   No acute concerns. Discussed family history, recent events, and general health maintenance. Continue routine health maintenance and screenings. Schedule mammogram for November or December and an overdue bone density scan. Discussed RSV vaccination for those 65 and over, available at the pharmacy. Consider genetic testing for cancer risk due to family history, available through a grant. Ensure vaccinations are up to date, including tetanus, pneumonia, and influenza.   Sanoe Hazan R Lowne Chase, DO

## 2024-01-10 NOTE — Assessment & Plan Note (Signed)
 Ghm utd Check labs  See AVS Health Maintenance  Topic Date Due   DEXA SCAN  08/08/2023   COVID-19 Vaccine (1 - 2024-25 season) 01/26/2024 (Originally 12/26/2023)   Medicare Annual Wellness (AWV)  03/21/2024   Colonoscopy  11/24/2025   DTaP/Tdap/Td (3 - Td or Tdap) 06/26/2028   Pneumococcal Vaccine: 50+ Years  Completed   Influenza Vaccine  Completed   Hepatitis C Screening  Completed   Zoster Vaccines- Shingrix  Completed   HPV VACCINES  Aged Out   Meningococcal B Vaccine  Aged Out   Mammogram  Discontinued

## 2024-01-12 ENCOUNTER — Other Ambulatory Visit: Payer: Self-pay | Admitting: Family Medicine

## 2024-01-12 ENCOUNTER — Emergency Department (HOSPITAL_BASED_OUTPATIENT_CLINIC_OR_DEPARTMENT_OTHER)

## 2024-01-12 ENCOUNTER — Encounter (HOSPITAL_BASED_OUTPATIENT_CLINIC_OR_DEPARTMENT_OTHER): Payer: Self-pay

## 2024-01-12 ENCOUNTER — Other Ambulatory Visit: Payer: Self-pay

## 2024-01-12 ENCOUNTER — Emergency Department (HOSPITAL_BASED_OUTPATIENT_CLINIC_OR_DEPARTMENT_OTHER)
Admission: EM | Admit: 2024-01-12 | Discharge: 2024-01-12 | Disposition: A | Discharge status: Home / Self Care | Attending: Emergency Medicine | Admitting: Emergency Medicine

## 2024-01-12 DIAGNOSIS — S92354A Nondisplaced fracture of fifth metatarsal bone, right foot, initial encounter for closed fracture: Secondary | ICD-10-CM | POA: Diagnosis not present

## 2024-01-12 DIAGNOSIS — W19XXXA Unspecified fall, initial encounter: Secondary | ICD-10-CM

## 2024-01-12 DIAGNOSIS — W010XXA Fall on same level from slipping, tripping and stumbling without subsequent striking against object, initial encounter: Secondary | ICD-10-CM | POA: Insufficient documentation

## 2024-01-12 DIAGNOSIS — R0781 Pleurodynia: Secondary | ICD-10-CM | POA: Insufficient documentation

## 2024-01-12 DIAGNOSIS — J9811 Atelectasis: Secondary | ICD-10-CM | POA: Diagnosis not present

## 2024-01-12 DIAGNOSIS — S92351A Displaced fracture of fifth metatarsal bone, right foot, initial encounter for closed fracture: Secondary | ICD-10-CM | POA: Insufficient documentation

## 2024-01-12 DIAGNOSIS — S99921A Unspecified injury of right foot, initial encounter: Secondary | ICD-10-CM | POA: Diagnosis present

## 2024-01-12 DIAGNOSIS — E785 Hyperlipidemia, unspecified: Secondary | ICD-10-CM

## 2024-01-12 DIAGNOSIS — Z7982 Long term (current) use of aspirin: Secondary | ICD-10-CM | POA: Insufficient documentation

## 2024-01-12 DIAGNOSIS — S99191A Other physeal fracture of right metatarsal, initial encounter for closed fracture: Secondary | ICD-10-CM

## 2024-01-12 MED ORDER — IBUPROFEN 800 MG PO TABS
800.0000 mg | ORAL_TABLET | Freq: Once | ORAL | Status: AC
  Administered 2024-01-12: 800 mg via ORAL
  Filled 2024-01-12: qty 1

## 2024-01-12 MED ORDER — KETOROLAC TROMETHAMINE 15 MG/ML IJ SOLN
15.0000 mg | Freq: Once | INTRAMUSCULAR | Status: DC
  Filled 2024-01-12: qty 1

## 2024-01-12 MED ORDER — LIDOCAINE 5 % EX PTCH
2.0000 | MEDICATED_PATCH | CUTANEOUS | Status: DC
  Administered 2024-01-12: 2 via TRANSDERMAL
  Filled 2024-01-12: qty 2

## 2024-01-12 NOTE — ED Provider Notes (Signed)
 [facility] Provider Note   CSN: [id number] Arrival date & time: 01/12/24  1118     Patient presents with: Kim Mclaughlin is a 78 y.o. female presents today after a mechanical fall where she twisted her right foot/ankle and fell onto her left side.  Patient reports right ankle pain and left anterior chest and lateral rib pain.  Patient reports the pain is worse with deep inhalation.  Patient denies head injury, neck pain, numbness, weakness, loss of bowel or bladder, abrasions, shortness of breath or any other injuries.  Patient takes daily baby aspirin  but no blood thinners.    Fall       Prior to Admission medications   Medication Sig Start Date End Date Taking? Authorizing Provider  Ascorbic Acid (VITA-C PO) Take by mouth.    [provider]  aspirin  EC 81 MG tablet Take 1 tablet (81 mg total) by mouth daily. Swallow whole. 12/30/21   Rosemarie Eather RAMAN, MD  atorvastatin  (LIPITOR) 40 MG tablet Take 1 tablet (40 mg total) by mouth daily. 01/12/24   Lowne Chase, Yvonne R, DO  Boswellia-Glucosamine-Vit D (OSTEO BI-FLEX ONE PER DAY PO) Take 1 tablet by mouth 2 (two) times daily.    [provider]  cetirizine (ZYRTEC) 10 MG tablet Take 10 mg by mouth daily as needed for allergies.    [provider]  Cyanocobalamin  (B-12 PO) Take by mouth.    [provider]  Glucosamine HCl-MSM (MSM GLUCOSAMINE PO) Take 2 tablets by mouth daily.    [provider]  hydrocortisone  cream 1 % Apply to affected area 2 times daily 12/21/21   Christopher Savannah, PA-C  MAGNESIUM PO Take 1 capsule by mouth daily.    [provider]  Multiple Vitamins-Minerals (MULTIVITAMIN ADULTS 50+) TABS Take 1 tablet by mouth daily.    [provider]  Multiple Vitamins-Minerals (ZINC PO) Take by mouth.    [provider]  Omega-3 Fatty Acids (FISH OIL PO) Take 1 capsule by mouth daily.    [provider]  triamcinolone  (NASACORT ) 55 MCG/ACT AERO nasal inhaler Place 2 sprays into the nose daily as needed (allergies).     [provider]  triamcinolone  cream (KENALOG ) 0.1 % Apply 1 Application topically 2 (two) times daily as needed (eczema.). 08/19/22   Antonio Meth, Yvonne R, DO  UNABLE TO FIND Take 1 tablet by mouth 3 (three) times daily. Med Name: bone strength- plant based calcium     [provider]    Allergies: Macrobid   [nitrofurantoin  macrocrystal], Doxycycline, Pravastatin sodium, Amoxicillin-pot clavulanate, Augmentin [amoxicillin-pot clavulanate], Codeine, Methylphenidate hcl, Methylprednisolone , and Sulfamethoxazole-trimethoprim    Review of Systems  Musculoskeletal:  Positive for arthralgias.    Updated Vital Signs BP (!) 166/72 (BP Location: Right Arm)   Pulse 79   Temp 98.2 F (36.8 C)   Resp 16   SpO2 95%   Physical Exam Vitals and nursing note reviewed.  Constitutional:      General: She is not in acute distress.    Appearance: She is well-developed. She is not ill-appearing.  HENT:     Head: Normocephalic and atraumatic.     Right Ear: External ear normal.     Left Ear: External ear normal.  Eyes:     Conjunctiva/sclera: Conjunctivae normal.  Cardiovascular:     Rate and Rhythm: Normal rate and regular rhythm.     Pulses: Normal pulses.  Heart sounds: Normal heart sounds. No murmur heard. Pulmonary:     Effort: Pulmonary effort is normal. No respiratory distress.     Breath sounds: Normal breath sounds.  Abdominal:     Palpations: Abdomen is soft.     Tenderness: There is no abdominal tenderness.  Musculoskeletal:        General: Tenderness present. No swelling.     Cervical back: Neck supple.     Comments: Tenderness to palpation of the right lateral malleolus without obvious deformity, ecchymosis, or swelling noted on exam.  Minimal tenderness to palpation of right proximal fifth metatarsal.  Patient denies tenderness to  palpation of the right navicular or medial malleolus.  Patient also has tenderness to palpation of the left anterior and lateral ribs without ecchymosis, deformity or paradoxical movement.  +2 dorsalis pedis pulses bilaterally.  Skin:    General: Skin is warm and dry.     Capillary Refill: Capillary refill takes less than 2 seconds.  Neurological:     Mental Status: She is alert.  Psychiatric:        Mood and Affect: Mood normal.     (all labs ordered are listed, but only abnormal results are displayed) Labs Reviewed - No data to display  EKG: None  Radiology: DG Ankle Complete Right Result Date: 01/12/2024 CLINICAL DATA:  Fall, pain in foot and ankle. EXAM: RIGHT ANKLE - COMPLETE 3+ VIEW COMPARISON:  None Available. FINDINGS: Lateral soft tissue swelling. Fracture through the proximal right 5th metatarsal again noted as seen on foot series compatible with Jones fracture. No fracture noted at the ankle. No subluxation or dislocation. IMPRESSION: Jones fracture in the proximal right 5th metatarsal. Lateral soft tissue swelling. Electronically Signed   By: Franky Crease M.D.   On: 01/12/2024 13:47   DG Foot Complete Right Result Date: 01/12/2024 CLINICAL DATA:  Fall, right foot and ankle pain. EXAM: RIGHT FOOT COMPLETE - 3+ VIEW COMPARISON:  None available FINDINGS: Nondisplaced fracture noted through the proximal right 5th metatarsal. This appears to cross the proximal shaft. This likely reflects a Jones fracture rather than avulsion fracture. Mild overlying soft tissue swelling. No subluxation or dislocation. Postoperative changes in the 1st through 3rd metatarsal heads. IMPRESSION: Nondisplaced proximal right 5th metatarsal fracture, likely Jones type fracture rather than avulsion fracture. Electronically Signed   By: Franky Crease M.D.   On: 01/12/2024 13:46   DG Ribs Unilateral W/Chest Left Result Date: 01/12/2024 CLINICAL DATA:  Fall, left lower anterior rib pain. EXAM: LEFT RIBS AND CHEST  - 3+ VIEW COMPARISON:  04/29/2022 FINDINGS: No visible rib fracture. Minimal left base atelectasis. No pneumothorax. Heart and mediastinal contours within normal limits. IMPRESSION: Minimal left base atelectasis. No visible rib fracture or pneumothorax. Electronically Signed   By: Franky Crease M.D.   On: 01/12/2024 13:42     Procedures   Medications Ordered in the ED  lidocaine  (LIDODERM ) 5 % 2 patch (2 patches Transdermal Patch Applied 01/12/24 1524)  ibuprofen  (ADVIL ) tablet 800 mg (has no administration in time range)                                    Medical Decision Making Amount and/or Complexity of Data Reviewed Radiology: ordered.   This patient presents to the ED for concern of a mechanical fall differential diagnosis includes musculoskeletal pain, fracture, dislocation  Imaging Studies ordered:  I ordered imaging studies including right  ankle and foot x-rays I independently visualized and interpreted imaging which showed nondisplaced proximal right fifth metatarsal fracture, likely Jones type fracture rather than avulsion fracture I agree with the radiologist interpretation Left rib x-rays which showed no visible fractures or pneumothorax.   Medicines ordered and prescription drug management: I ordered medication including ibuprofen  I have reviewed the patients home medicines and have made adjustments as needed   Problem List / ED Course:  Patient placed in cam boot and walker order to DME Considered for admission or further workup however patient's vital signs, physical exam, and imaging are reassuring.  Patient symptoms likely due to right Jones fracture.  Patient placed in cam boot and walker ordered outpatient.  Patient to follow-up with Dr. Fidel with orthopedics for further evaluation and workup.  Patient also given incentive spirometer to ensure adequate respirations secondary to rib pain.  Patient given return precautions.  I feel patient safe for discharge  at this time.       Final diagnoses:  Fall, initial encounter  Closed fracture of base of fifth metatarsal bone of right foot at metaphyseal-diaphyseal junction, initial encounter    ED Discharge Orders          Ordered    Walker standard  Status:  Canceled        01/12/24 1538    Walker standard  Status:  Canceled        01/12/24 1539    Walker standard        01/12/24 1540               Francis Ileana SAILOR, PA-C 01/12/24 1606    Dreama Longs, MD 01/12/24 2325

## 2024-01-12 NOTE — ED Triage Notes (Signed)
 States twisted R foot and fell on L side. R foot/ankle pain, L rib pain. Pain when taking deep breath  Denies head injury, takes daily aspirin 

## 2024-01-12 NOTE — Discharge Instructions (Addendum)
 Today you were seen for a right proximal fifth metatarsal fracture.  Please use walk in your boot and using your walker.  Please also use your incentive spirometer as directed.  Please follow-up with Dr. Fidel with orthopedics for further evaluation workup.  You may alternate Tylenol  and Motrin  as needed for pain.  Thank you for letting us  treat you today. After reviewing your imaging, I feel you are safe to go home. Please follow up with your PCP in the next several days and provide them with your records from this visit. Return to the Emergency Room if pain becomes severe or symptoms worsen.

## 2024-01-12 NOTE — Addendum Note (Signed)
 Addended by: ANTONIO CYNDEE ROCKERS R on: 01/12/2024 10:06 PM   Modules accepted: Level of Service

## 2024-01-13 ENCOUNTER — Ambulatory Visit: Payer: Self-pay | Admitting: Family Medicine

## 2024-01-13 DIAGNOSIS — S92351A Displaced fracture of fifth metatarsal bone, right foot, initial encounter for closed fracture: Secondary | ICD-10-CM | POA: Diagnosis not present

## 2024-02-01 DIAGNOSIS — S92351A Displaced fracture of fifth metatarsal bone, right foot, initial encounter for closed fracture: Secondary | ICD-10-CM | POA: Diagnosis not present

## 2024-03-05 ENCOUNTER — Encounter: Payer: Self-pay | Admitting: Family Medicine

## 2024-03-05 ENCOUNTER — Telehealth: Payer: Self-pay

## 2024-03-05 DIAGNOSIS — S92351D Displaced fracture of fifth metatarsal bone, right foot, subsequent encounter for fracture with routine healing: Secondary | ICD-10-CM | POA: Diagnosis not present

## 2024-03-05 NOTE — Telephone Encounter (Signed)
 Order alreaady in chart. Re-faxed to [facility].

## 2024-03-05 NOTE — Telephone Encounter (Signed)
 Copied from CRM [id number]. Topic: Clinical - Request for Lab/Test Order >> Mar 05, 2024 10:51 AM Nessti S wrote: Reason for CRM: pt called to have bone density order sent to [facility] via fax number [phone]. Pt call back number [phone]

## 2024-03-09 ENCOUNTER — Ambulatory Visit (HOSPITAL_BASED_OUTPATIENT_CLINIC_OR_DEPARTMENT_OTHER)
Admission: RE | Admit: 2024-03-09 | Discharge: 2024-03-09 | Disposition: A | Discharge status: Home / Self Care | Source: Ambulatory Visit | Attending: Family Medicine | Admitting: Family Medicine

## 2024-03-09 ENCOUNTER — Ambulatory Visit: Payer: Self-pay | Admitting: Family Medicine

## 2024-03-09 ENCOUNTER — Ambulatory Visit: Admitting: Family Medicine

## 2024-03-09 ENCOUNTER — Encounter: Payer: Self-pay | Admitting: Family Medicine

## 2024-03-09 VITALS — BP 124/80 | HR 70 | Ht 63.0 in | Wt 149.0 lb

## 2024-03-09 DIAGNOSIS — M19042 Primary osteoarthritis, left hand: Secondary | ICD-10-CM | POA: Diagnosis not present

## 2024-03-09 DIAGNOSIS — M25542 Pain in joints of left hand: Secondary | ICD-10-CM | POA: Diagnosis not present

## 2024-03-09 DIAGNOSIS — M79645 Pain in left finger(s): Secondary | ICD-10-CM

## 2024-03-09 NOTE — Progress Notes (Signed)
 Acute Office Visit  Subjective:  Patient ID: Kim Mclaughlin, female    DOB: 22-May-1945  Age: 78 y.o. MRN: [id number]  CC:  Chief Complaint  Patient presents with   Finger Injury     HPI Kim Mclaughlin is here for finger pain.   Discussed the use of AI scribe software for clinical note transcription with the patient, who gave verbal consent to proceed.  History of Present Illness Kim Mclaughlin is a 78 year old female with history of osteoarthritis who presents with pain and redness in the left 5th finger distal interphalangeal joint.  She has been experiencing tenderness and soreness in the left distal interphalangeal (DIP) joint for approximately one month. The pain is rated as 5 out of 10, worsens with movement, and is relieved by rest. Initially, the joint was red, but it is now the same color as her skin. No fever, chills, or redness extending up the arm. Possibly some swelling in the joint, but not significant.   She has a history of osteoarthritis and has received injections in other joints previously. She uses Advil  and Comfrey cream for relief, which she finds effective, and has Voltaren  gel available but has not yet tried. She denies any recent injury or mechanical force to the finger.  She has a known allergy to penicillin and doxycycline. She takes 81 mg of aspirin  daily but is not on any other blood thinners. Her uric acid levels were normal in 2011, and she has not had a recent check for gout.  She has not had an x-ray of the affected finger before, and there is no history of trauma to the area. She notes that the pain increases as the day progresses and is more uncomfortable with use.        Past Medical History:  Diagnosis Date   Allergy    Arthritis 07/2014   Right hand, prescribed prednisone -no longer taking. also Rt knww   Brain aneurysm    DVT (deep venous thrombosis) (HCC) 2015   very small; treated with compression and heat, no other  problems since   GERD (gastroesophageal reflux disease)    patient denies states she does have hx of peptic ulcer many years   Hepatitis    in 10 th grade  (not sure type- not hep C) - treated  no other problems   Hyperlipidemia    diet controlled   Memory loss    MVP (mitral valve prolapse)    no problems, MD told patient she could not hear it   OSA (obstructive sleep apnea) 10/08/2021   Osteoarthritis    Osteopenia    [facility]   PONV (postoperative nausea and vomiting)    with ear surgery only - No other problems with other surgeries   Precancerous lesion 12/2014   Treated with cream only   TGA (transient global amnesia)    Varicose veins    Vitamin D  deficiency     Past Surgical History:  Procedure Laterality Date   APPENDECTOMY     BUNIONECTOMY WITH HAMMERTOE RECONSTRUCTION Left 04/22/2016   Procedure: MODIFIED MCBRIDE BUNIONECTOMY,  LEFT SECOND HAMMERTOE CORRECTION;  Surgeon: Norleen Armor, MD;  Location:  SUR[facility]ERY C[facility];  Service: Orthopedics;  Laterality: Left;   COLONOSCOPY     07/2012 [facility] - [facility]/father - polyps   colonoscopy with polypectomy  07/2012    hyperplastic;Dr [facility]   DILATION AND CURETTAGE OF UTERUS     DILATION AND CURETTAGE OF UTERUS N/A 09/03/2014  Procedure: DILATATION AND CURETTAGE;  Surgeon: Dasie Gull, MD;  Location: [facility];  Service: Gynecology;  Laterality: N/A;   ENDOVENOUS ABLATION SAPHENOUS VEIN W/ LASER Left 01/31/2014   EVLA  left greater saphenous vein  by Krystal Doing MD   ENDOVENOUS ABLATION SAPHENOUS VEIN W/ LASER Right 03/14/2014   endovenous laser ablation right greater saphenous vein  by Krystal Doing MD   FOOT ARTHRODESIS Left 04/22/2016   Procedure: LEFT FIRST TARSAL METATARSAL ARTHRODESIS;  Surgeon: Norleen Armor, MD;  Location: [facility];  Service: Orthopedics;  Laterality: Left;   G 3 P 3     HYSTEROSCOPY WITH D & C N/A 03/25/2016   Procedure: DILATATION AND CURETTAGE /HYSTEROSCOPY;  Surgeon:  Jolene Gaskins, MD;  Location: [facility];  Service: Gynecology;  Laterality: N/A;  WITH ULTRASOUND GUIDANCE   left ear surgery     REPLACEMENT TOTAL KNEE Right 09/15/2022   aluisio   right knee replacment      may 2024   STAPEDECTOMY     TARSAL METATARSAL FUSION WITH WEIL OSTEOTOMY Left 04/22/2016   Procedure: 2-4 TARSAL METATARSAL FUSION WITH WEIL OSTEOTOMY;  Surgeon: Norleen Armor, MD;  Location: [facility];  Service: Orthopedics;  Laterality: Left;   TUBAL LIGATION      Family History  Problem Relation Age of Onset   Pancreatic cancer Mother    Miscarriages / Stillbirths Mother    Thyroid  disease Mother        partial thyroidectomy ; S/P RAI   Heart attack Father        in early 48s   Colon cancer Father    Seizures Sister    Ovarian cancer Maternal Aunt    Breast cancer Paternal Aunt    Aneurysm Paternal Uncle        brain   Colon cancer Paternal Grandmother    Stroke Paternal Grandmother        > 65   Heart attack Paternal Grandfather        in 38s   Cancer Other        among paternal sibs (colon, breast, bone)   Diabetes Neg Hx    Stomach cancer Neg Hx    Ulcerative colitis Neg Hx    Esophageal cancer Neg Hx    Rectal cancer Neg Hx     Social History   Socioeconomic History   Marital status: Married    Spouse name: Christopher   Number of children: Not on file   Years of education: Not on file   Highest education level: Bachelor's degree (e.g., BA, AB, BS)  Occupational History   Occupation: retired  Tobacco Use   Smoking status: Former    Current packs/day: 0.00    Types: Cigarettes    Quit date: 04/27/1963    Years since quitting: 60.9   Smokeless tobacco: Never   Tobacco comments:    Smoked < 6 mos;< 1 pack total;quit 1965  Vaping Use   Vaping status: Never Used  Substance and Sexual Activity   Alcohol use: No   Drug use: No   Sexual activity: Yes    Birth control/protection: Post-menopausal  Other Topics Concern   Not on file  Social  History Narrative   Lives with husband   Right Handed   Drinks average 2-3 cups caffeine daily   Retired    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 [employer] Strain: Low Risk  (03/08/2024)   Overall Financial Resource Strain (CARDIA)  Difficulty of Paying Living Expenses: Not hard at all  Food Insecurity: No Food Insecurity (03/08/2024)   Hunger Vital Sign    Worried About Running Out of Food in the Last Year: Never true    Ran Out of Food in the Last Year: Never true  Transportation Needs: No Transportation Needs (03/08/2024)   PRAPARE - Administrator, Civil Service (Medical): No    Lack of Transportation (Non-Medical): No  Physical Activity: Insufficiently Active (03/08/2024)   Exercise Vital Sign    Days of Exercise per Week: 1 day    Minutes of Exercise per Session: 20 min  Stress: No Stress Concern Present (03/08/2024)   [employer] of Occupational Health - Occupational Stress Questionnaire    Feeling of Stress: Not at all  Social Connections: Moderately Integrated (03/08/2024)   Social Connection and Isolation Panel    Frequency of Communication with Friends and Family: Once a week    Frequency of Social Gatherings with Friends and Family: Once a week    Attends Religious Services: More than 4 times per year    Active Member of [employer] or Organizations: Yes    Attends Engineer, Structural: More than 4 times per year    Marital Status: Married  Catering Manager Violence: Not At Risk (03/22/2023)   Humiliation, Afraid, Rape, and Kick questionnaire    Fear of Current or Ex-Partner: No    Emotionally Abused: No    Physically Abused: No    Sexually Abused: No    ROS All ROS negative except what is listed in the HPI.   Objective:   Today's Vitals: BP 124/80   Pulse 70   Ht 5' 3 (1.6 m)   Wt 149 lb (67.6 kg)   SpO2 99%   BMI 26.39 kg/m   Physical Exam Vitals reviewed.  Constitutional:      Appearance: Normal appearance.  Skin:     General: Skin is warm and dry.     Findings: No bruising, erythema, lesion or rash.     Comments: Left 5th finger with minimal inflammation to DIP, no significant erythema, no heat, streaking, fluctuance, induration, drainage, or nail changes.   Neurological:     Mental Status: She is alert and oriented to person, place, and time.  Psychiatric:        Mood and Affect: Mood normal.        Behavior: Behavior normal.        Thought Content: Thought content normal.        Judgment: Judgment normal.        Assessment & Plan:   Problem List Items Addressed This Visit   None Visit Diagnoses       Pain of finger of left hand    -  Primary   Relevant Orders   DG Finger Little Left (Completed)       Assessment & Plan Left little finger distal interphalangeal joint pain and swelling Pain and intermittent swelling in the left little finger DIP joint for one month, exacerbated by movement and relieved by rest. Differential includes osteoarthritis and gout. Inflammatory process suspected due to relief with Advil . No signs of infection. Uric acid levels were normal in 2011. Area not tender to touch. No currently erythematous. - Ordered x-ray of left little finger DIP joint. - Use Voltaren  gel for topical anti-inflammatory effect. - Take ibuprofen  or Aleve twice daily as needed for pain. Discussed safety.  - Apply ice and/or heat and perform  gentle stretching  - Monitor for signs of infection such as heat, streaking erythema, significant swelling, severe pain, or drainage. - Consider future uric acid testing if x-ray is inconclusive.   Patient aware of signs/symptoms requiring further/urgent evaluation.    Follow-up: Return if symptoms worsen or fail to improve.   Waddell FURY Almarie, DNP, FNP-C  I,Emily Lagle,acting as a neurosurgeon for Waddell KATHEE Almarie, NP.,have documented all relevant documentation on the behalf of Waddell KATHEE Almarie, NP.   I, Waddell KATHEE Almarie, NP, have reviewed all documentation for  this visit. The documentation on 03/09/2024 for the exam, diagnosis, procedures, and orders are all accurate and complete.

## 2024-03-26 DIAGNOSIS — R399 Unspecified symptoms and signs involving the genitourinary system: Secondary | ICD-10-CM | POA: Diagnosis not present

## 2024-03-26 DIAGNOSIS — Z1231 Encounter for screening mammogram for malignant neoplasm of breast: Secondary | ICD-10-CM | POA: Diagnosis not present

## 2024-03-26 DIAGNOSIS — Z01419 Encounter for gynecological examination (general) (routine) without abnormal findings: Secondary | ICD-10-CM | POA: Diagnosis not present

## 2024-03-26 LAB — HM MAMMOGRAPHY

## 2024-03-29 ENCOUNTER — Ambulatory Visit: Payer: Medicare PPO

## 2024-03-29 VITALS — BP 118/60 | HR 73 | Temp 98.3°F | Ht 63.0 in | Wt 150.0 lb

## 2024-03-29 DIAGNOSIS — Z0000 Encounter for general adult medical examination without abnormal findings: Secondary | ICD-10-CM | POA: Diagnosis not present

## 2024-03-29 NOTE — Progress Notes (Signed)
 Chief Complaint  Patient presents with   Medicare Wellness     Subjective:   Kim Mclaughlin is a 78 y.o. female who presents for a Medicare Annual Wellness Visit.  Visit info / Clinical Intake: Medicare Wellness Visit Type:: Subsequent Annual Wellness Visit Persons participating in visit and providing information:: patient Medicare Wellness Visit Mode:: In-person (required for WTM) Interpreter Needed?: No Pre-visit prep was completed: yes AWV questionnaire completed by patient prior to visit?: yes Date:: 03/29/24 Living arrangements:: lives with spouse/significant other Patient's Overall Health Status Rating: very good Typical amount of pain: some (Arthritis Body pain) Does pain affect daily life?: no Are you currently prescribed opioids?: no  Dietary Habits and Nutritional Risks How many meals a day?: 3 Eats fruit and vegetables daily?: yes Most meals are obtained by: preparing own meals Diabetic:: no  Functional Status Activities of Daily Living (to include ambulation/medication): Independent Ambulation: Independent with device- listed below Home Assistive Devices/Equipment: Eyeglasses Medication Administration: Independent Home Management (perform basic housework or laundry): Independent Manage your own finances?: yes Primary transportation is: driving; family / friends Concerns about vision?: no *vision screening is required for WTM* Concerns about hearing?: no  Fall Screening Falls in the past year?: 1 Number of falls in past year: 0 Was there an injury with Fall?: 1 (Rx in rt foot and bruises ribs. Followed by medical attention) Fall Risk Category Calculator: 2 Patient Fall Risk Level: Moderate Fall Risk  Fall Risk Patient at Risk for Falls Due to: Impaired balance/gait Fall risk Follow up: Falls prevention discussed  Home and Transportation Safety: All rugs have non-skid backing?: yes All stairs or steps have railings?: yes Grab bars in the bathtub  or shower?: yes Have non-skid surface in bathtub or shower?: yes Good home lighting?: yes Regular seat belt use?: yes Hospital stays in the last year:: no  Cognitive Assessment Difficulty concentrating, remembering, or making decisions? : no Will 6CIT or Mini Cog be Completed: no 6CIT or Mini Cog Declined: patient alert, oriented, able to answer questions appropriately and recall recent events  Advance Directives (For Healthcare) Does Patient Have a Medical Advance Directive?: Yes Does patient want to make changes to medical advance directive?: No - Patient declined Type of Advance Directive: Healthcare Power of [place]; Living will Copy of Healthcare Power of Attorney in Chart?: No - copy requested Copy of Living Will in Chart?: No - copy requested  Reviewed/Updated  Reviewed/Updated: Reviewed All (Medical, Surgical, Family, Medications, Allergies, Care Teams, Patient Goals)    Allergies (verified) Macrobid   [nitrofurantoin  macrocrystal], Doxycycline, Pravastatin sodium, Amoxicillin-pot clavulanate, Augmentin [amoxicillin-pot clavulanate], Codeine, Methylphenidate hcl, Methylprednisolone , and Sulfamethoxazole-trimethoprim   Current Medications (verified) Outpatient Encounter Medications as of 03/29/2024  Medication Sig   Ascorbic Acid (VITA-C PO) Take by mouth.   aspirin  EC 81 MG tablet Take 1 tablet (81 mg total) by mouth daily. Swallow whole.   atorvastatin  (LIPITOR) 40 MG tablet Take 1 tablet (40 mg total) by mouth daily.   augmented betamethasone  dipropionate (DIPROLENE -AF) 0.05 % ointment Apply topically.   Boswellia-Glucosamine-Vit D (OSTEO BI-FLEX ONE PER DAY PO) Take 1 tablet by mouth 2 (two) times daily.   cetirizine (ZYRTEC) 10 MG tablet Take 10 mg by mouth daily as needed for allergies.   Cyanocobalamin  (B-12 PO) Take by mouth.   escitalopram  (LEXAPRO ) 10 MG tablet Take 10 mg by mouth daily.   Glucosamine HCl-MSM (MSM GLUCOSAMINE PO) Take 2 tablets by mouth daily.    hydrocortisone  cream 1 % Apply to affected area  2 times daily   MAGNESIUM PO Take 1 capsule by mouth daily.   Multiple Vitamins-Minerals (MULTIVITAMIN ADULTS 50+) TABS Take 1 tablet by mouth daily.   Multiple Vitamins-Minerals (ZINC PO) Take by mouth.   Omega-3 Fatty Acids (FISH OIL PO) Take 1 capsule by mouth daily.   triamcinolone  (NASACORT ) 55 MCG/ACT AERO nasal inhaler Place 2 sprays into the nose daily as needed (allergies).    triamcinolone  cream (KENALOG ) 0.1 % Apply 1 Application topically 2 (two) times daily as needed (eczema.).   Ubiquinol 100 MG CAPS Take by oral route.   UNABLE TO FIND Take 1 tablet by mouth 3 (three) times daily. Med Name: bone strength- plant based calcium    No facility-administered encounter medications on file as of 03/29/2024.    History: Past Medical History:  Diagnosis Date   Allergy    Arthritis 07/2014   Right hand, prescribed prednisone -no longer taking. also Rt knww   Brain aneurysm    DVT (deep venous thrombosis) (HCC) 2015   very small; treated with compression and heat, no other problems since   GERD (gastroesophageal reflux disease)    patient denies states she does have hx of peptic ulcer many years   Hepatitis    in 10 th grade  (not sure type- not hep C) - treated  no other problems   Hyperlipidemia    diet controlled   Memory loss    MVP (mitral valve prolapse)    no problems, MD told patient she could not hear it   OSA (obstructive sleep apnea) 10/08/2021   Osteoarthritis    Osteopenia    [facility]   PONV (postoperative nausea and vomiting)    with ear surgery only - No other problems with other surgeries   Precancerous lesion 12/2014   Treated with cream only   TGA (transient global amnesia)    Varicose veins    Vitamin D  deficiency    Past Surgical History:  Procedure Laterality Date   APPENDECTOMY     BUNIONECTOMY WITH HAMMERTOE RECONSTRUCTION Left 04/22/2016   Procedure: MODIFIED MCBRIDE BUNIONECTOMY,  LEFT SECOND HAMMERTOE  CORRECTION;  Surgeon: Norleen Armor, MD;  Location: [facility];  Service: Orthopedics;  Laterality: Left;   COLONOSCOPY     07/2012 [facility] - [facility]/father - polyps   colonoscopy with polypectomy  07/2012    hyperplastic;Dr [facility]   DILATION AND CURETTAGE OF UTERUS     DILATION AND CURETTAGE OF UTERUS N/A 09/03/2014   Procedure: DILATATION AND CURETTAGE;  Surgeon: Dasie Gull, MD;  Location: [facility];  Service: Gynecology;  Laterality: N/A;   ENDOVENOUS ABLATION SAPHENOUS VEIN W/ LASER Left 01/31/2014   EVLA  left greater saphenous vein  by Krystal Doing MD   ENDOVENOUS ABLATION SAPHENOUS VEIN W/ LASER Right 03/14/2014   endovenous laser ablation right greater saphenous vein  by Krystal Doing MD   FOOT ARTHRODESIS Left 04/22/2016   Procedure: LEFT FIRST TARSAL METATARSAL ARTHRODESIS;  Surgeon: Norleen Armor, MD;  Location: [facility];  Service: Orthopedics;  Laterality: Left;   G 3 P 3     HYSTEROSCOPY WITH D & C N/A 03/25/2016   Procedure: DILATATION AND CURETTAGE /HYSTEROSCOPY;  Surgeon: Jolene Gaskins, MD;  Location: [facility];  Service: Gynecology;  Laterality: N/A;  WITH ULTRASOUND GUIDANCE   left ear surgery     REPLACEMENT TOTAL KNEE Right 09/15/2022   [facility]io   right knee replacment      may 2024   STAPEDECTOMY  TARSAL METATARSAL FUSION WITH WEIL OSTEOTOMY Left 04/22/2016   Procedure: 2-4 TARSAL METATARSAL FUSION WITH WEIL OSTEOTOMY;  Surgeon: Norleen Armor, MD;  Location: [facility];  Service: Orthopedics;  Laterality: Left;   TUBAL LIGATION     Family History  Problem Relation Age of Onset   Pancreatic cancer Mother    Miscarriages / Stillbirths Mother    Thyroid  disease Mother        partial thyroidectomy ; S/P RAI   Heart attack Father        in early 88s   Colon cancer Father    Seizures Sister    Ovarian cancer Maternal Aunt    Breast cancer Paternal Aunt    Aneurysm Paternal Uncle        brain   Colon cancer Paternal  Grandmother    Stroke Paternal Grandmother        > 65   Heart attack Paternal Grandfather        in 82s   Cancer Other        among paternal sibs (colon, breast, bone)   Diabetes Neg Hx    Stomach cancer Neg Hx    Ulcerative colitis Neg Hx    Esophageal cancer Neg Hx    Rectal cancer Neg Hx    Social History   Occupational History   Occupation: retired  Tobacco Use   Smoking status: Former    Current packs/day: 0.00    Types: Cigarettes    Quit date: 04/27/1963    Years since quitting: 60.9   Smokeless tobacco: Never   Tobacco comments:    Smoked < 6 mos;< 1 pack total;quit 1965  Vaping Use   Vaping status: Never Used  Substance and Sexual Activity   Alcohol use: No   Drug use: No   Sexual activity: Yes    Birth control/protection: Post-menopausal   Tobacco Counseling Counseling given: No Tobacco comments: Smoked < 6 mos;< 1 pack total;quit 1965  SDOH Screenings   Food Insecurity: No Food Insecurity (03/29/2024)  Housing: Unknown (03/29/2024)  Transportation Needs: No Transportation Needs (03/29/2024)  Utilities: Not At Risk (03/29/2024)  Alcohol Screen: Low Risk  (02/25/2021)  Depression (PHQ2-9): Low Risk  (03/29/2024)  Financial Resource Strain: Low Risk  (03/08/2024)  Physical Activity: Inactive (03/29/2024)  Social Connections: Socially Integrated (03/29/2024)  Stress: No Stress Concern Present (03/29/2024)  Tobacco Use: Medium Risk (03/29/2024)  Health Literacy: Adequate Health Literacy (03/29/2024)   See flowsheets for full screening details  Depression Screen PHQ 2 & 9 Depression Scale- Over the past 2 weeks, how often have you been bothered by any of the following problems? Little interest or pleasure in doing things: 0 Feeling down, depressed, or hopeless (PHQ Adolescent also includes...irritable): 0 PHQ-2 Total Score: 0 Trouble falling or staying asleep, or sleeping too much: 0 Feeling tired or having little energy: 0 Poor appetite or overeating (PHQ  Adolescent also includes...weight loss): 0 Feeling bad about yourself - or that you are a failure or have let yourself or your family down: 0 Trouble concentrating on things, such as reading the newspaper or watching television (PHQ Adolescent also includes...like school work): 0 Moving or speaking so slowly that other people could have noticed. Or the opposite - being so fidgety or restless that you have been moving around a lot more than usual: 0 Thoughts that you would be better off dead, or of hurting yourself in some way: 0 PHQ-9 Total Score: 0 If you checked off any problems,  how difficult have these problems made it for you to do your work, take care of things at home, or get along with other people?: Not difficult at all     Goals Addressed               This Visit's Progress     Increase physical activity (pt-stated)        Get more active             Objective:    Today's Vitals   03/29/24 1401  BP: 118/60  Pulse: 73  Temp: 98.3 F (36.8 C)  TempSrc: Oral  SpO2: 96%  Weight: 150 lb (68 kg)  Height: 5' 3 (1.6 m)   Body mass index is 26.57 kg/m.  Hearing/Vision screen Hearing Screening - Comments:: Denies hearing difficulties   Vision Screening - Comments:: Wears rx glasses - up to date with routine eye exams with  [facility] Immunizations and Health Maintenance Health Maintenance  Topic Date Due   Bone Density Scan  08/08/2023   COVID-19 Vaccine (1 - 2025-26 season) 03/08/2025 (Originally 12/26/2023)   Medicare Annual Wellness (AWV)  03/29/2025   Colonoscopy  11/24/2025   DTaP/Tdap/Td (3 - Td or Tdap) 06/26/2028   Pneumococcal Vaccine: 50+ Years  Completed   Influenza Vaccine  Completed   Hepatitis C Screening  Completed   Zoster Vaccines- Shingrix  Completed   Meningococcal B Vaccine  Aged Out   Mammogram  Discontinued        Assessment/Plan:  This is a routine wellness examination for [place].  Patient Care Team: Antonio Meth, Jamee SAUNDERS, DO  as PCP - General (Family Medicine) Gretta Gums, MD as Consulting Physician (Obstetrics) Heide Ingle, MD as Consulting Physician (Orthopedic Surgery) Lynnell Nottingham, MD as Consulting Physician (Dermatology) Legrand Victory LITTIE MOULD, MD as Consulting Physician (Gastroenterology)  I have personally reviewed and noted the following in the patient's chart:   Medical and social history Use of alcohol, tobacco or illicit drugs  Current medications and supplements including opioid prescriptions. Functional ability and status Nutritional status Physical activity Advanced directives List of other physicians Hospitalizations, surgeries, and ER visits in previous 12 months Vitals Screenings to include cognitive, depression, and falls Referrals and appointments  No orders of the defined types were placed in this encounter.  In addition, I have reviewed and discussed with patient certain preventive protocols, quality metrics, and best practice recommendations. A written personalized care plan for preventive services as well as general preventive health recommendations were provided to patient.   Rojelio LELON Blush, LPN   [id number]   Return in 1 year on 04/04/25  After Visit Summary: (In Person-Declined) Patient declined AVS at this time.  Nurse Notes: None

## 2024-03-29 NOTE — Patient Instructions (Addendum)
 Ms. Vanorman,  Thank you for taking the time for your Medicare Wellness Visit. I appreciate your continued commitment to your health goals. Please review the care plan we discussed, and feel free to reach out if I can assist you further.  Please note that Annual Wellness Visits do not include a physical exam. Some assessments may be limited, especially if the visit was conducted virtually. If needed, we may recommend an in-person follow-up with your provider.  Ongoing Care Seeing your primary care provider every 3 to 6 months helps us  monitor your health and provide consistent, personalized care.   Referrals If a referral was made during today's visit and you haven't received any updates within two weeks, please contact the referred provider directly to check on the status.  Recommended Screenings:  Health Maintenance  Topic Date Due   Osteoporosis screening with Bone Density Scan  08/08/2023   COVID-19 Vaccine (1 - 2025-26 season) 03/08/2025*   Medicare Annual Wellness Visit  03/29/2025   Colon Cancer Screening  11/24/2025   DTaP/Tdap/Td vaccine (3 - Td or Tdap) 06/26/2028   Pneumococcal Vaccine for age over 69  Completed   Flu Shot  Completed   Hepatitis C Screening  Completed   Zoster (Shingles) Vaccine  Completed   Meningitis B Vaccine  Aged Out   Breast Cancer Screening  Discontinued  *Topic was postponed. The date shown is not the original due date.       03/29/2024    2:12 PM  Advanced Directives  Does Patient Have a Medical Advance Directive? Yes  Type of Estate Agent of [place];Living will  Does patient want to make changes to medical advance directive? No - Patient declined  Copy of Healthcare Power of Attorney in Chart? No - copy requested    Vision: Annual vision screenings are recommended for early detection of glaucoma, cataracts, and diabetic retinopathy. These exams can also reveal signs of chronic conditions such as diabetes and high  blood pressure.  Dental: Annual dental screenings help detect early signs of oral cancer, gum disease, and other conditions linked to overall health, including heart disease and diabetes.  Please see the attached documents for additional preventive care recommendations.

## 2024-03-30 ENCOUNTER — Encounter: Payer: Self-pay | Admitting: Family Medicine

## 2024-04-04 ENCOUNTER — Encounter: Payer: Self-pay | Admitting: Gastroenterology

## 2024-04-17 LAB — HM DEXA SCAN

## 2024-04-20 ENCOUNTER — Encounter: Payer: Self-pay | Admitting: Family Medicine

## 2024-04-22 ENCOUNTER — Ambulatory Visit: Payer: Self-pay | Admitting: Family

## 2024-04-27 ENCOUNTER — Emergency Department (HOSPITAL_COMMUNITY)

## 2024-04-27 ENCOUNTER — Ambulatory Visit: Payer: Self-pay

## 2024-04-27 ENCOUNTER — Encounter (HOSPITAL_COMMUNITY): Payer: Self-pay

## 2024-04-27 ENCOUNTER — Emergency Department (HOSPITAL_COMMUNITY): Admission: EM | Admit: 2024-04-27 | Discharge: 2024-04-27 | Disposition: A | Discharge status: Home / Self Care

## 2024-04-27 DIAGNOSIS — I6523 Occlusion and stenosis of bilateral carotid arteries: Secondary | ICD-10-CM | POA: Diagnosis not present

## 2024-04-27 DIAGNOSIS — R0789 Other chest pain: Secondary | ICD-10-CM | POA: Insufficient documentation

## 2024-04-27 DIAGNOSIS — Z7982 Long term (current) use of aspirin: Secondary | ICD-10-CM | POA: Insufficient documentation

## 2024-04-27 DIAGNOSIS — M25512 Pain in left shoulder: Secondary | ICD-10-CM | POA: Diagnosis not present

## 2024-04-27 DIAGNOSIS — I6782 Cerebral ischemia: Secondary | ICD-10-CM | POA: Diagnosis not present

## 2024-04-27 DIAGNOSIS — R079 Chest pain, unspecified: Secondary | ICD-10-CM | POA: Diagnosis present

## 2024-04-27 LAB — COMPREHENSIVE METABOLIC PANEL WITH GFR
ALT: 24 U/L (ref 0–44)
AST: 29 U/L (ref 15–41)
Albumin: 4.2 g/dL (ref 3.5–5.0)
Alkaline Phosphatase: 124 U/L (ref 38–126)
Anion gap: 11 (ref 5–15)
BUN: 10 mg/dL (ref 8–23)
CO2: 24 mmol/L (ref 22–32)
Calcium: 10 mg/dL (ref 8.9–10.3)
Chloride: 108 mmol/L (ref 98–111)
Creatinine, Ser: 0.73 mg/dL (ref 0.44–1.00)
GFR, Estimated: >60 mL/min
Glucose, Bld: 113 mg/dL — ABNORMAL HIGH (ref 70–99)
Potassium: 4 mmol/L (ref 3.5–5.1)
Sodium: 142 mmol/L (ref 135–145)
Total Bilirubin: 0.5 mg/dL (ref 0.0–1.2)
Total Protein: 7.2 g/dL (ref 6.5–8.1)

## 2024-04-27 LAB — CBC WITH DIFFERENTIAL/PLATELET
Abs Immature Granulocytes: 0.02 K/uL (ref 0.00–0.07)
Basophils Absolute: 0 K/uL (ref 0.0–0.1)
Basophils Relative: 0 %
Eosinophils Absolute: 0.1 K/uL (ref 0.0–0.5)
Eosinophils Relative: 2 %
HCT: 43.2 % (ref 36.0–46.0)
Hemoglobin: 14.5 g/dL (ref 12.0–15.0)
Immature Granulocytes: 0 %
Lymphocytes Relative: 23 %
Lymphs Abs: 1.4 K/uL (ref 0.7–4.0)
MCH: 31 pg (ref 26.0–34.0)
MCHC: 33.6 g/dL (ref 30.0–36.0)
MCV: 92.3 fL (ref 80.0–100.0)
Monocytes Absolute: 0.4 K/uL (ref 0.1–1.0)
Monocytes Relative: 6 %
Neutro Abs: 4 K/uL (ref 1.7–7.7)
Neutrophils Relative %: 69 %
Platelets: 280 K/uL (ref 150–400)
RBC: 4.68 MIL/uL (ref 3.87–5.11)
RDW: 12.7 % (ref 11.5–15.5)
WBC: 5.9 K/uL (ref 4.0–10.5)
nRBC: 0 % (ref 0.0–0.2)

## 2024-04-27 LAB — TROPONIN T, HIGH SENSITIVITY: Troponin T High Sensitivity: <15 ng/L (ref 0–19)

## 2024-04-27 LAB — LIPASE, BLOOD: Lipase: 47 U/L (ref 11–51)

## 2024-04-27 MED ORDER — METHOCARBAMOL 500 MG PO TABS
500.0000 mg | ORAL_TABLET | Freq: Once | ORAL | Status: AC
  Administered 2024-04-27: 500 mg via ORAL
  Filled 2024-04-27: qty 1

## 2024-04-27 MED ORDER — METHOCARBAMOL 500 MG PO TABS: 500.0000 mg | ORAL_TABLET | Freq: Two times a day (BID) | ORAL | 0 refills | Status: AC

## 2024-04-27 MED ORDER — IOHEXOL 350 MG/ML SOLN
100.0000 mL | Freq: Once | INTRAVENOUS | Status: AC | PRN
  Administered 2024-04-27: 100 mL via INTRAVENOUS

## 2024-04-27 MED ORDER — LIDOCAINE 5 % EX PTCH: 1.0000 | MEDICATED_PATCH | CUTANEOUS | 0 refills | Status: AC

## 2024-04-27 MED ORDER — ACETAMINOPHEN 325 MG PO TABS
650.0000 mg | ORAL_TABLET | Freq: Once | ORAL | Status: AC
  Administered 2024-04-27: 650 mg via ORAL
  Filled 2024-04-27: qty 2

## 2024-04-27 MED ORDER — LIDOCAINE 5 % EX PTCH
1.0000 | MEDICATED_PATCH | Freq: Once | CUTANEOUS | Status: DC
  Administered 2024-04-27: 1 via TRANSDERMAL
  Filled 2024-04-27: qty 1

## 2024-04-27 NOTE — Telephone Encounter (Signed)
 FYI Only or Action Required?: FYI only for provider: ED advised.  Patient was last seen in primary care on 03/09/2024 by [name], NP.  Called Nurse Triage reporting Chest Pain.  Symptoms began today.  Interventions attempted: Other: Was eval by EMS, referred to ED.  Symptoms are: stable.  Triage Disposition: Go to ED Now (Notify PCP)  Patient/caregiver understands and will follow disposition?:    Copied from CRM #[id number]. Topic: Clinical - Red Word Triage >> Apr 27, 2024  8:23 AM Berneda FALCON wrote: Red Word that prompted transfer to Nurse Triage: Patient had EMS come out this morning for symptoms of heart attack per patient (sharp back pain, heaviness in her left arm, shoulder pain in left shoulder)  EMS did an EKG on her on site which was normal, but told her to follow up with the hospital to have bloodwork done to rule out a heart attack. Patient states she does not wish to wait at the ER and would like to have the bloodwork done in office instead. Reason for Disposition  Pain also in shoulder(s) or arm(s) or jaw  (Exception: Pain is clearly made worse by movement.)  Answer Assessment - Initial Assessment Questions 1. LOCATION: Where does it hurt?       Left back, shoulder, arm. Ems did EKG, showed no changes but advised ED.  Protocols used: Chest Pain-A-AH

## 2024-04-27 NOTE — ED Notes (Signed)
Pt ambulated to restroom independently. Steady gait noted.

## 2024-04-27 NOTE — ED Triage Notes (Signed)
 N/V starting Monday morning. This morning around 0315 woke up out of her sleep with left shoulder pain. Left arm now feels heavy. Denies any chest pain. Took four baby aspirin  per dispatch recommendation.

## 2024-04-27 NOTE — ED Provider Notes (Signed)
 " [place] [facility]ENC[facility]PARTMENT AT [facility] Provider Note   CSN: [id number] Arrival date & time: 04/27/24  9095     Patient presents with: Shoulder Pain   Kim Mclaughlin is a 79 y.o. female.   Presents with left chest/shoulder discomfort with heaviness feeling to her left arm.  Started at 3 AM this morning.  Thought she may have slept on the wrong, reporting pain to her upper left chest and some heaviness sensation to her left arm.  Not having weakness or changes in sensation, but feels that her arm is depleted as compared to the other.  No shortness of breath.  No recent URI symptoms.  No cardiac history or lung history.   Shoulder Pain      Prior to Admission medications  Medication Sig Start Date End Date Taking? Authorizing Provider  lidocaine  (LIDODERM ) 5 % Place 1 patch onto the skin daily. Remove & Discard patch within 12 hours or as directed by MD 04/27/24  Yes Neysa Caron PARAS, DO  methocarbamol (ROBAXIN) 500 MG tablet Take 1 tablet (500 mg total) by mouth 2 (two) times daily. 04/27/24  Yes Neysa Caron PARAS, DO  Ascorbic Acid (VITA-C PO) Take by mouth.    [provider]  aspirin  EC 81 MG tablet Take 1 tablet (81 mg total) by mouth daily. Swallow whole. 12/30/21   Rosemarie Eather RAMAN, MD  atorvastatin  (LIPITOR) 40 MG tablet Take 1 tablet (40 mg total) by mouth daily. 01/12/24   Antonio Cyndee Jamee JONELLE, DO  augmented betamethasone  dipropionate (DIPROLENE -AF) 0.05 % ointment Apply topically. 03/06/24   [provider]  Boswellia-Glucosamine-Vit D (OSTEO BI-FLEX ONE PER DAY PO) Take 1 tablet by mouth 2 (two) times daily.    [provider]  cetirizine (ZYRTEC) 10 MG tablet Take 10 mg by mouth daily as needed for allergies.    [provider]  Cyanocobalamin  (B-12 PO) Take by mouth.    [provider]  escitalopram  (LEXAPRO ) 10 MG tablet Take 10 mg by mouth daily.    [provider]  Glucosamine HCl-MSM (MSM GLUCOSAMINE  PO) Take 2 tablets by mouth daily.    [provider]  hydrocortisone  cream 1 % Apply to affected area 2 times daily 12/21/21   Christopher Savannah, PA-C  MAGNESIUM PO Take 1 capsule by mouth daily.    [provider]  Multiple Vitamins-Minerals (MULTIVITAMIN ADULTS 50+) TABS Take 1 tablet by mouth daily.    [provider]  Multiple Vitamins-Minerals (ZINC PO) Take by mouth.    [provider]  Omega-3 Fatty Acids (FISH OIL PO) Take 1 capsule by mouth daily.    [provider]  triamcinolone  (NASACORT ) 55 MCG/ACT AERO nasal inhaler Place 2 sprays into the nose daily as needed (allergies).     [provider]  triamcinolone  cream (KENALOG ) 0.1 % Apply 1 Application topically 2 (two) times daily as needed (eczema.). 08/19/22   Lowne Chase, Yvonne R, DO  Ubiquinol 100 MG CAPS Take by oral route.    [provider]  UNABLE TO FIND Take 1 tablet by mouth 3 (three) times daily. Med Name: bone strength- plant based calcium     [provider]    Allergies: Macrobid   [nitrofurantoin  macrocrystal], Doxycycline, Pravastatin sodium, Amoxicillin-pot clavulanate, Augmentin [amoxicillin-pot clavulanate], Codeine, Methylphenidate hcl, Methylprednisolone , and Sulfamethoxazole-trimethoprim    Review of Systems  Updated Vital Signs BP (!) 160/80   Pulse 78   Temp 97.9 F (36.6 C) (Oral)  Resp 18   SpO2 100%   Physical Exam Vitals and nursing note reviewed.  Constitutional:      General: She is not in acute distress.    Appearance: She is not toxic-appearing.  HENT:     Head: Normocephalic.     Nose: Nose normal.     Mouth/Throat:     Mouth: Mucous membranes are moist.  Eyes:     Conjunctiva/sclera: Conjunctivae normal.     Pupils: Pupils are equal, round, and reactive to light.  Cardiovascular:     Rate and Rhythm: Normal rate and regular rhythm.  Pulmonary:     Effort: Pulmonary effort is normal.     Breath sounds: Normal breath  sounds.  Abdominal:     General: Abdomen is flat. There is no distension.     Palpations: Abdomen is soft.     Tenderness: There is no abdominal tenderness. There is no guarding or rebound.  Musculoskeletal:        General: Normal range of motion.     Comments: No midline spinal tenderness.  No tenderness to the left shoulder.  No erythema warmth or swelling.  Left upper extremity without edema or swelling as compared to the right.  Strong pulses.  Neurovascularly intact.  Full range of motion and able left arm above her head behind her head, behind her back.  Skin:    General: Skin is warm.     Capillary Refill: Capillary refill takes less than 2 seconds.  Neurological:     General: No focal deficit present.     Mental Status: She is alert and oriented to person, place, and time.     Cranial Nerves: No cranial nerve deficit.     Sensory: No sensory deficit.     Motor: No weakness.     Coordination: Coordination normal.  Psychiatric:        Mood and Affect: Mood normal.        Behavior: Behavior normal.     (all labs ordered are listed, but only abnormal results are displayed) Labs Reviewed  COMPREHENSIVE METABOLIC PANEL WITH GFR - Abnormal; Notable for the following components:      Result Value   Glucose, Bld 113 (*)    All other components within normal limits  CBC WITH DIFFERENTIAL/PLATELET  LIPASE, BLOOD  TROPONIN T, HIGH SENSITIVITY    EKG: None  Radiology: MR BRAIN WO CONTRAST Result Date: 04/27/2024 EXAM: MRI BRAIN WITHOUT CONTRAST 04/27/2024 02:46:04 PM TECHNIQUE: Multiplanar multisequence MRI of the head/brain was performed without the administration of intravenous contrast. COMPARISON: CT head 04/27/2024. CLINICAL HISTORY: Stroke, follow up. FINDINGS: BRAIN AND VENTRICLES: No acute infarct. No acute hemorrhage. Focus of susceptibility in the right posterior aspect of the external capsule, consistent with chronic microhemorrhage. No mass. No midline shift. No  hydrocephalus. The sella is unremarkable. Normal flow voids. ORBITS: No acute abnormality. SINUSES AND MASTOIDS: No acute abnormality. BONES AND SOFT TISSUES: Normal marrow signal. No acute soft tissue abnormality. IMPRESSION: 1. No acute intracranial abnormality. Electronically signed by: Ryan Chess MD 04/27/2024 03:23 PM EST RP Workstation: [id number]   CT Angio Chest Aorta W and/or Wo Contrast Result Date: 04/27/2024 EXAM: CTA CHEST AORTA 04/27/2024 11:11:49 AM TECHNIQUE: CTA of the chest was performed after the administration of intravenous contrast. Multiplanar reformatted images are provided for review. MIP images are provided for review. Automated exposure control, iterative reconstruction, and/or weight based adjustment of the mA/kV was utilized to reduce the radiation dose to as low as  reasonably achievable. COMPARISON: Same date chest radiograph and prior studies. CLINICAL HISTORY: Acute aortic syndrome (AAS) suspected. FINDINGS: AORTA: No thoracic aortic dissection. No aneurysm. MEDIASTINUM: No mediastinal lymphadenopathy. The heart and pericardium demonstrate no acute abnormality. Visualized pulmonary arteries are patent with no pulmonary embolism identified within the limits of the scan. LYMPH NODES: No mediastinal, hilar or axillary lymphadenopathy. LUNGS AND PLEURA: Bibasilar subsegmental atelectasis. No focal consolidation or pulmonary edema. No pleural effusion or pneumothorax. UPPER ABDOMEN: Limited images of the upper abdomen are unremarkable. SOFT TISSUES AND BONES: Likely old left-sided rib fractures. No acute soft tissue abnormality. IMPRESSION: 1. No acute abnormality of the aorta. 2. No acute pulmonary abnormality. Electronically signed by: Michaeline Blanch MD 04/27/2024 12:21 PM EST RP Workstation: [id number]   CT Head Wo Contrast Result Date: 04/27/2024 EXAM: CT HEAD WITHOUT CONTRAST 04/27/2024 11:11:13 AM TECHNIQUE: CT of the head was performed without the administration of intravenous  contrast. Automated exposure control, iterative reconstruction, and/or weight based adjustment of the mA/kV was utilized to reduce the radiation dose to as low as reasonably achievable. COMPARISON: CT head 09/30/2021. CLINICAL HISTORY: Neuro deficit, acute, stroke suspected. FINDINGS: BRAIN AND VENTRICLES: No acute hemorrhage. No evidence of acute infarct. No hydrocephalus. No extra-axial collection. No mass effect or midline shift. Mild atherosclerosis of the carotid siphons. ORBITS: No acute abnormality. SINUSES: No acute abnormality. SOFT TISSUES AND SKULL: No acute soft tissue abnormality. No skull fracture. Stapes prosthesis on the right. IMPRESSION: 1. No acute intracranial abnormality. Electronically signed by: Donnice Mania MD 04/27/2024 11:23 AM EST RP Workstation: [id number]   DG Chest Portable 1 View Result Date: 04/27/2024 CLINICAL DATA:  Chest pain EXAM: PORTABLE CHEST 1 VIEW COMPARISON:  January 12, 2024 FINDINGS: The heart size and mediastinal contours are within normal limits. Both lungs are clear. The visualized skeletal structures are unremarkable. IMPRESSION: No active disease. Electronically Signed   By: Lynwood Landy Raddle M.D.   On: 04/27/2024 11:21     Procedures   Medications Ordered in the ED  lidocaine  (LIDODERM ) 5 % 1 patch (1 patch Transdermal Patch Applied 04/27/24 1318)  iohexol  (OMNIPAQUE ) 350 MG/ML injection 100 mL (100 mLs Intravenous Contrast Given 04/27/24 1057)  methocarbamol (ROBAXIN) tablet 500 mg (500 mg Oral Given 04/27/24 1318)  acetaminophen  (TYLENOL ) tablet 650 mg (650 mg Oral Given 04/27/24 1318)    Clinical Course as of 04/27/24 1652  Fri Apr 27, 2024  1238 CT Angio Chest Aorta W and/or Wo Contrast IMPRESSION: 1. No acute abnormality of the aorta. 2. No acute pulmonary abnormality   [TY]  1238 CT Head Wo Contrast IMPRESSION: 1. No acute intracranial abnormality.   [TY]  1527 MR BRAIN WO CONTRAST IMPRESSION: 1. No acute intracranial abnormality.   [TY]   1529 Patient reports improvement of her symptoms after medications.  With negative imaging, feel that she is appropriate for outpatient follow-up.  Will discharge in stable condition. [TY]    Clinical Course User Index [TY] Neysa Caron PARAS, DO                                 Medical Decision Making 79 year old complex past history to include TGA, hyperlipidemia prior DVT presenting emergency department with pain to her left chest/shoulder.  She is afebrile, nontachycardic is hypertensive 171/88.  Maintaining oxygen saturation on room air.  On physical exam did not appear to be in overt distress, equal pulses, clear lung sounds.  No localizing neurodeficits.  Did not appear to have edema or swelling consistent with DVT to her left upper extremity, compartments are soft.  Strong pulses.  Low suspicion for ischemic limb.  Given her complaint of chest pain plus arm pain concern for possible dissection however CTA negative.  Also with her complaint of heaviness concern for possible stroke however CT head and MRI negative.  She has no significant metabolic derangements.  Does not appear to be infectious or septic joint.  Troponin is negative.  ACS unlikely.  EKG independently reviewed by myself without ischemic changes.  Was treated with lidocaine  patch and Robaxin with some improvement of her discomfort.  Given negative workup will discharge in stable condition  Amount and/or Complexity of Data Reviewed Independent Historian:     Details: Spouse at bedside notes that she is acting her normal self External Data Reviewed:     Details: No history of stroke, but does have history of TGA has had negative MRIs in the past Labs: ordered. Radiology: ordered and independent interpretation performed. Decision-making details documented in ED Course.    Details: Do not appreciate obvious intracranial hemorrhage on CT head.  No pneumonia pneumothorax on chest x-ray ECG/medicine tests: independent interpretation  performed.  Risk OTC drugs. Prescription drug management. Decision regarding hospitalization. Diagnosis or treatment significantly limited by social determinants of health.       Final diagnoses:  Atypical chest pain    ED Discharge Orders          Ordered    lidocaine  (LIDODERM ) 5 %  Every 24 hours        04/27/24 1531    methocarbamol (ROBAXIN) 500 MG tablet  2 times daily        04/27/24 1531               Neysa Caron PARAS, DO 04/27/24 1652  "

## 2024-04-27 NOTE — Discharge Instructions (Signed)
 May take the pain medication as we are prescribing and you may also use over-the-counter medication such as Tylenol  or ibuprofen .  Follow-up with primary doctor and your neurologist.  Return for strokelike symptoms, severe headache, vision loss, facial droop, unilateral weakness, chest pain, shortness of breath, lightheadedness, passout or any new or worsening symptoms that are concerning to you.

## 2024-05-11 ENCOUNTER — Encounter: Payer: Self-pay | Admitting: Family Medicine

## 2024-05-14 ENCOUNTER — Encounter: Payer: Self-pay | Admitting: Family Medicine

## 2024-05-14 ENCOUNTER — Ambulatory Visit: Admitting: Family Medicine

## 2024-05-14 VITALS — BP 112/68 | HR 75 | Temp 98.1°F | Resp 16 | Ht 63.0 in | Wt 146.4 lb

## 2024-05-14 DIAGNOSIS — J0140 Acute pansinusitis, unspecified: Secondary | ICD-10-CM

## 2024-05-14 DIAGNOSIS — R051 Acute cough: Secondary | ICD-10-CM

## 2024-05-14 LAB — POC COVID19 BINAXNOW: SARS Coronavirus 2 Ag: NEGATIVE

## 2024-05-14 LAB — POC INFLUENZA A&B (BINAX/QUICKVUE)
Influenza A, POC: NEGATIVE
Influenza B, POC: NEGATIVE

## 2024-05-14 MED ORDER — PROMETHAZINE-DM 6.25-15 MG/5ML PO SYRP: 5.0000 mL | ORAL_SOLUTION | Freq: Four times a day (QID) | ORAL | 0 refills | Status: AC | PRN

## 2024-05-14 MED ORDER — AZITHROMYCIN 250 MG PO TABS: ORAL_TABLET | ORAL | 0 refills | Status: AC

## 2024-05-14 MED ORDER — FLUTICASONE PROPIONATE 50 MCG/ACT NA SUSP: 2.0000 | Freq: Every day | NASAL | 6 refills | Status: AC

## 2024-05-14 NOTE — Progress Notes (Signed)
 "  Subjective:    Patient ID: Kim Mclaughlin, female    DOB: 1946-03-22, 79 y.o.   MRN: [id number]  Chief Complaint  Patient presents with   ED follow up    Pt following up for atypical chest pain.    Cough    Sxs started Thursday, pro cough,sinus congestion. Pt taking Dayquil. No covid or flu    HPI Patient is in today for cough.  Discussed the use of AI scribe software for clinical note transcription with the patient, who gave verbal consent to proceed.  History of Present Illness Kim Mclaughlin is a 79 year old female who presents with a persistent cough and recent emergency room visit for chest pain.  Two weeks ago, she experienced chest pain originating around her left shoulder blade, described as an uncomfortable, unusual pain. The pain woke her at 4:00 AM, and she noticed heaviness in her left arm while walking. She did not experience shooting or throbbing pain. She took four baby aspirin , which relieved the pain. The pain has not returned since.  She has a persistent cough, which she believes started before she developed cold symptoms. The cough has persisted, and she began coughing up green sputum this morning. She also reports sinus congestion, a stuffy nose, sinus pressure, and watering of the left eye. No sore throat, and the cough does not worsen at night. She uses a CPAP machine, which she feels keeps her awake at night.  She is currently taking [facility] valcyclovir and [facility] sinus medicine, alternating them. She is unsure if these medications are effective. She also mentions having Flonase  at home, though it may be expired.  She is concerned about being contagious, as her grandson, who recently started daycare, had a runny nose when she held and kissed him last Tuesday. No flu or COVID symptoms.    Past Medical History:  Diagnosis Date   Allergy    Arthritis 07/2014   Right hand, prescribed prednisone -no longer taking. also Rt knww   Brain aneurysm    DVT (deep venous  thrombosis) (HCC) 2015   very small; treated with compression and heat, no other problems since   GERD (gastroesophageal reflux disease)    patient denies states she does have hx of peptic ulcer many years   Hepatitis    in 10 th grade  (not sure type- not hep C) - treated  no other problems   Hyperlipidemia    diet controlled   Memory loss    MVP (mitral valve prolapse)    no problems, MD told patient she could not hear it   OSA (obstructive sleep apnea) 10/08/2021   Osteoarthritis    Osteopenia    [facility]   PONV (postoperative nausea and vomiting)    with ear surgery only - No other problems with other surgeries   Precancerous lesion 12/2014   Treated with cream only   TGA (transient global amnesia)    Varicose veins    Vitamin D  deficiency     Past Surgical History:  Procedure Laterality Date   APPENDECTOMY     BUNIONECTOMY WITH HAMMERTOE RECONSTRUCTION Left 04/22/2016   Procedure: MODIFIED MCBRIDE BUNIONECTOMY,  LEFT SECOND HAMMERTOE CORRECTION;  Surgeon: Norleen Armor, MD;  Location: [facility];  Service: Orthopedics;  Laterality: Left;   COLONOSCOPY     07/2012 [facility] - [facility]/father - polyps   colonoscopy with polypectomy  07/2012    hyperplastic;Dr Brodie   DILATION AND CURETTAGE OF UTERUS  DILATION AND CURETTAGE OF UTERUS N/A 09/03/2014   Procedure: DILATATION AND CURETTAGE;  Surgeon: Dasie Gull, MD;  Location: [facility];  Service: Gynecology;  Laterality: N/A;   ENDOVENOUS ABLATION SAPHENOUS VEIN W/ LASER Left 01/31/2014   EVLA  left greater saphenous vein  by Krystal Doing MD   ENDOVENOUS ABLATION SAPHENOUS VEIN W/ LASER Right 03/14/2014   endovenous laser ablation right greater saphenous vein  by Krystal Doing MD   FOOT ARTHRODESIS Left 04/22/2016   Procedure: LEFT FIRST TARSAL METATARSAL ARTHRODESIS;  Surgeon: Norleen Armor, MD;  Location: [facility];  Service: Orthopedics;  Laterality: Left;   G 3 P 3     HYSTEROSCOPY WITH D &  C N/A 03/25/2016   Procedure: DILATATION AND CURETTAGE /HYSTEROSCOPY;  Surgeon: Jolene Gaskins, MD;  Location: [facility];  Service: Gynecology;  Laterality: N/A;  WITH ULTRASOUND GUIDANCE   left ear surgery     REPLACEMENT TOTAL KNEE Right 09/15/2022   aluisio   right knee replacment      may 2024   STAPEDECTOMY     TARSAL METATARSAL FUSION WITH WEIL OSTEOTOMY Left 04/22/2016   Procedure: 2-4 TARSAL METATARSAL FUSION WITH WEIL OSTEOTOMY;  Surgeon: Norleen Armor, MD;  Location: [facility];  Service: Orthopedics;  Laterality: Left;   TUBAL LIGATION      Family History  Problem Relation Age of Onset   Pancreatic cancer Mother    Miscarriages / Stillbirths Mother    Thyroid  disease Mother        partial thyroidectomy ; S/P RAI   Heart attack Father        in early 31s   Colon cancer Father    Seizures Sister    Ovarian cancer Maternal Aunt    Breast cancer Paternal Aunt    Aneurysm Paternal Uncle        brain   Colon cancer Paternal Grandmother    Stroke Paternal Grandmother        > 65   Heart attack Paternal Grandfather        in 51s   Cancer Other        among paternal sibs (colon, breast, bone)   Diabetes Neg Hx    Stomach cancer Neg Hx    Ulcerative colitis Neg Hx    Esophageal cancer Neg Hx    Rectal cancer Neg Hx     Social History   Socioeconomic History   Marital status: Married    Spouse name: Christopher   Number of children: Not on file   Years of education: Not on file   Highest education level: Bachelor's degree (e.g., BA, AB, BS)  Occupational History   Occupation: retired  Tobacco Use   Smoking status: Former    Current packs/day: 0.00    Types: Cigarettes    Quit date: 04/27/1963    Years since quitting: 61.0   Smokeless tobacco: Never   Tobacco comments:    Smoked < 6 mos;< 1 pack total;quit 1965  Vaping Use   Vaping status: Never Used  Substance and Sexual Activity   Alcohol use: No   Drug use: No   Sexual activity: Yes    Birth  control/protection: Post-menopausal  Other Topics Concern   Not on file  Social History Narrative   Lives with husband   Right Handed   Drinks average 2-3 cups caffeine daily   Retired    Social Drivers of Health   Tobacco Use: Medium Risk (05/14/2024)  Patient History    Smoking Tobacco Use: Former    Smokeless Tobacco Use: Never    Passive Exposure: Not on file  Financial Resource Strain: Low Risk (03/08/2024)   Overall Financial Resource Strain (CARDIA)    Difficulty of Paying Living Expenses: Not hard at all  Food Insecurity: No Food Insecurity (03/29/2024)   Epic    Worried About Programme Researcher, Broadcasting/film/video in the Last Year: Never true    Ran Out of Food in the Last Year: Never true  Transportation Needs: No Transportation Needs (03/29/2024)   Epic    Lack of Transportation (Medical): No    Lack of Transportation (Non-Medical): No  Physical Activity: Inactive (03/29/2024)   Exercise Vital Sign    Days of Exercise per Week: 0 days    Minutes of Exercise per Session: 0 min  Stress: No Stress Concern Present (03/29/2024)   [employer] of Occupational Health - Occupational Stress Questionnaire    Feeling of Stress: Not at all  Social Connections: Socially Integrated (03/29/2024)   Social Connection and Isolation Panel    Frequency of Communication with Friends and Family: More than three times a week    Frequency of Social Gatherings with Friends and Family: More than three times a week    Attends Religious Services: More than 4 times per year    Active Member of [employer] or Organizations: Yes    Attends Banker Meetings: More than 4 times per year    Marital Status: Married  Catering Manager Violence: Not At Risk (03/29/2024)   Epic    Fear of Current or Ex-Partner: No    Emotionally Abused: No    Physically Abused: No    Sexually Abused: No  Depression (PHQ2-9): Low Risk (03/29/2024)   Depression (PHQ2-9)    PHQ-2 Score: 0  Alcohol Screen: Not on file  Housing:  Unknown (03/29/2024)   Epic    Unable to Pay for Housing in the Last Year: No    Number of Times Moved in the Last Year: Not on file    Homeless in the Last Year: No  Utilities: Not At Risk (03/29/2024)   Epic    Threatened with loss of utilities: No  Health Literacy: Adequate Health Literacy (03/29/2024)   B1300 Health Literacy    Frequency of need for help with medical instructions: Never    Outpatient Medications Prior to Visit  Medication Sig Dispense Refill   aspirin  EC 81 MG tablet Take 1 tablet (81 mg total) by mouth daily. Swallow whole. 30 tablet 12   atorvastatin  (LIPITOR) 40 MG tablet Take 1 tablet (40 mg total) by mouth daily. 90 tablet 1   augmented betamethasone  dipropionate (DIPROLENE -AF) 0.05 % ointment Apply topically.     Boswellia-Glucosamine-Vit D (OSTEO BI-FLEX ONE PER DAY PO) Take 1 tablet by mouth 2 (two) times daily.     cetirizine (ZYRTEC) 10 MG tablet Take 10 mg by mouth daily as needed for allergies.     Cyanocobalamin  (B-12 PO) Take by mouth.     lidocaine  (LIDODERM ) 5 % Place 1 patch onto the skin daily. Remove & Discard patch within 12 hours or as directed by MD 14 patch 0   MAGNESIUM PO Take 1 capsule by mouth daily.     methocarbamol  (ROBAXIN ) 500 MG tablet Take 1 tablet (500 mg total) by mouth 2 (two) times daily. 20 tablet 0   Multiple Vitamins-Minerals (MULTIVITAMIN ADULTS 50+) TABS Take 1 tablet by mouth daily.  Multiple Vitamins-Minerals (ZINC PO) Take by mouth.     Omega-3 Fatty Acids (FISH OIL PO) Take 1 capsule by mouth daily.     triamcinolone  (NASACORT ) 55 MCG/ACT AERO nasal inhaler Place 2 sprays into the nose daily as needed (allergies).      triamcinolone  cream (KENALOG ) 0.1 % Apply 1 Application topically 2 (two) times daily as needed (eczema.). 30 g 1   Ubiquinol 100 MG CAPS Take by oral route.     UNABLE TO FIND Take 1 tablet by mouth 3 (three) times daily. Med Name: bone strength- plant based calcium      Ascorbic Acid (VITA-C PO) Take by  mouth.     escitalopram  (LEXAPRO ) 10 MG tablet Take 10 mg by mouth daily.     Glucosamine HCl-MSM (MSM GLUCOSAMINE PO) Take 2 tablets by mouth daily.     hydrocortisone  cream 1 % Apply to affected area 2 times daily 15 g 0   No facility-administered medications prior to visit.    Allergies[1]  Review of Systems  Constitutional:  Negative for fever and malaise/fatigue.  HENT:  Positive for congestion and sinus pain.   Eyes:  Negative for blurred vision.  Respiratory:  Positive for cough and sputum production. Negative for shortness of breath and wheezing.   Cardiovascular:  Negative for chest pain, palpitations and leg swelling.  Gastrointestinal:  Negative for vomiting.  Musculoskeletal:  Negative for back pain.  Skin:  Negative for rash.  Neurological:  Negative for loss of consciousness and headaches.       Objective:    Physical Exam Vitals and nursing note reviewed.  Constitutional:      General: She is not in acute distress.    Appearance: Normal appearance. She is well-developed.  HENT:     Head: Normocephalic and atraumatic.     Nose:     Right Sinus: Maxillary sinus tenderness and frontal sinus tenderness present.     Left Sinus: Maxillary sinus tenderness and frontal sinus tenderness present.  Eyes:     General: No scleral icterus.       Right eye: No discharge.        Left eye: No discharge.  Cardiovascular:     Rate and Rhythm: Normal rate and regular rhythm.     Heart sounds: No murmur heard. Pulmonary:     Effort: Pulmonary effort is normal. No respiratory distress.     Breath sounds: Normal breath sounds. No wheezing or rales.  Chest:     Chest wall: No mass, deformity or tenderness.  Musculoskeletal:        General: Normal range of motion.     Cervical back: Normal range of motion and neck supple.     Right lower leg: No edema.     Left lower leg: No edema.  Skin:    General: Skin is warm and dry.  Neurological:     Mental Status: She is alert and  oriented to person, place, and time.  Psychiatric:        Mood and Affect: Mood normal.        Behavior: Behavior normal.        Thought Content: Thought content normal.        Judgment: Judgment normal.     BP 112/68 (BP Location: Left Arm, Patient Position: Sitting, Cuff Size: Normal)   Pulse 75   Temp 98.1 F (36.7 C) (Oral)   Resp 16   Ht 5' 3 (1.6 m)   Wt 146 lb  6.4 oz (66.4 kg)   SpO2 96%   BMI 25.93 kg/m  Wt Readings from Last 3 Encounters:  05/14/24 146 lb 6.4 oz (66.4 kg)  03/29/24 150 lb (68 kg)  03/09/24 149 lb (67.6 kg)    Diabetic Foot Exam - Simple   No data filed    Lab Results  Component Value Date   WBC 5.9 04/27/2024   HGB 14.5 04/27/2024   HCT 43.2 04/27/2024   PLT 280 04/27/2024   GLUCOSE 113 (H) 04/27/2024   CHOL 156 01/10/2024   TRIG 60.0 01/10/2024   HDL 79.40 01/10/2024   LDLDIRECT 188.0 08/08/2012   LDLCALC 65 01/10/2024   ALT 24 04/27/2024   AST 29 04/27/2024   NA 142 04/27/2024   K 4.0 04/27/2024   CL 108 04/27/2024   CREATININE 0.73 04/27/2024   BUN 10 04/27/2024   CO2 24 04/27/2024   TSH 1.26 01/10/2024   INR 1.0 05/01/2020   HGBA1C 5.7 (H) 05/02/2020    Lab Results  Component Value Date   TSH 1.26 01/10/2024   Lab Results  Component Value Date   WBC 5.9 04/27/2024   HGB 14.5 04/27/2024   HCT 43.2 04/27/2024   MCV 92.3 04/27/2024   PLT 280 04/27/2024   Lab Results  Component Value Date   NA 142 04/27/2024   K 4.0 04/27/2024   CO2 24 04/27/2024   GLUCOSE 113 (H) 04/27/2024   BUN 10 04/27/2024   CREATININE 0.73 04/27/2024   BILITOT 0.5 04/27/2024   ALKPHOS 124 04/27/2024   AST 29 04/27/2024   ALT 24 04/27/2024   PROT 7.2 04/27/2024   ALBUMIN 4.2 04/27/2024   CALCIUM  10.0 04/27/2024   ANIONGAP 11 04/27/2024   EGFR 73 05/19/2022   GFR 71.53 01/10/2024   Lab Results  Component Value Date   CHOL 156 01/10/2024   Lab Results  Component Value Date   HDL 79.40 01/10/2024   Lab Results  Component Value  Date   LDLCALC 65 01/10/2024   Lab Results  Component Value Date   TRIG 60.0 01/10/2024   Lab Results  Component Value Date   CHOLHDL 2 01/10/2024   Lab Results  Component Value Date   HGBA1C 5.7 (H) 05/02/2020       Assessment & Plan:  Acute cough -     POC COVID-19 BinaxNow -     POC Influenza A&B(BINAX/QUICKVUE) -     Promethazine -DM; Take 5 mLs by mouth 4 (four) times daily as needed.  Dispense: 118 mL; Refill: 0  Acute non-recurrent pansinusitis -     Azithromycin ; Take 2 tablets on day 1, then 1 tablet daily on days 2 through 5  Dispense: 6 tablet; Refill: 0 -     Promethazine -DM; Take 5 mLs by mouth 4 (four) times daily as needed.  Dispense: 118 mL; Refill: 0 -     Fluticasone  Propionate; Place 2 sprays into both nostrils daily.  Dispense: 16 g; Refill: 6  Assessment and Plan Assessment & Plan Acute pansinusitis   She experiences sinus pressure, colored nasal discharge, and left eye watering, indicating a likely bacterial infection. Start Z-Pak (azithromycin ) for bacterial sinusitis and Flonase  for nasal congestion. She should avoid using expired Flonase .  Acute cough   She has a persistent cough with green sputum, not worsening at night and not disturbing sleep. The cough may be related to sinusitis or a recent viral infection, with no signs of flu or COVID-19. Discussed potential contagiousness and advised avoiding  contact with vulnerable individuals until a few days of antibiotics have been taken. Prescribe promethazine  cough syrup for nighttime use, cautioning against daytime use due to drowsiness.    Kim JONELLE Shanks Chase, DO     [1]  Allergies Allergen Reactions   Macrobid   [Nitrofurantoin  Macrocrystal] Other (See Comments)   Doxycycline Hives   Pravastatin Sodium     REACTION: shin pain   Amoxicillin-Pot Clavulanate     GI symptoms; abdominal pain & nausea NOTE: able to take Ampicillin   Augmentin [Amoxicillin-Pot Clavulanate] Nausea Only    Upsets  stomach   Codeine Nausea Only   Methylphenidate Hcl Palpitations   Methylprednisolone  Rash    Rash and redness on face   Sulfamethoxazole-Trimethoprim Nausea Only     nausea   "

## 2024-05-14 NOTE — Telephone Encounter (Signed)
 Pt seen today

## 2024-05-15 ENCOUNTER — Telehealth: Payer: Self-pay

## 2024-05-15 NOTE — Telephone Encounter (Signed)
 Patient called and notified.

## 2024-05-15 NOTE — Telephone Encounter (Signed)
 Copied from CRM [id number]. Topic: Clinical - Medical Advice >> May 15, 2024 10:35 AM Robinson DEL wrote: Reason for CRM: Patient states she was in yesterday and has some questions for Jamee Shanks Chase's nurse wants to know if she will still be contagious on Wednesday  Kimbrely [phone]

## 2024-05-16 ENCOUNTER — Ambulatory Visit: Admitting: Gastroenterology

## 2024-05-16 ENCOUNTER — Encounter: Payer: Self-pay | Admitting: Gastroenterology

## 2024-05-16 VITALS — BP 136/86 | HR 68 | Ht 62.0 in | Wt 145.0 lb

## 2024-05-16 DIAGNOSIS — R194 Change in bowel habit: Secondary | ICD-10-CM

## 2024-05-16 DIAGNOSIS — R1032 Left lower quadrant pain: Secondary | ICD-10-CM

## 2024-05-16 DIAGNOSIS — M9905 Segmental and somatic dysfunction of pelvic region: Secondary | ICD-10-CM

## 2024-05-16 DIAGNOSIS — K5901 Slow transit constipation: Secondary | ICD-10-CM

## 2024-05-16 NOTE — Progress Notes (Signed)
 "     [facility] Consult Note:  History: RASHAN PATIENT 05/16/2024  Referring provider: Antonio Meth, Jamee SAUNDERS, DO  Reason for consult/chief complaint: Abdominal Pain (LLQ pain in abdomin. Pt. Reports stool is very soft and slim about the size of a cigar. Feels as though bowels are not completely emptying and then having to going to the bathroom multiple times a day. )   Subjective  Prior history:  Last seen in clinic 2022 for change in bowel habits.  Patient reporting normal BM early morning, then progressively thinner and smaller volume stools later in the day with feelings of incomplete evacuation.  Similar symptoms as reported in October 2017, at which time she was prescribed Linzess 72 mcg daily.  She could not recall if they have follow-up if it has been helpful.  2022 clinic rectal exam suggested possible rectocele (gynecology evaluation recommended).  FOBT positive on office DRE Colonoscopy 11/24/2020 normal except mild nonspecific distal rectal mucosal friability thought likely prep artifact or from mild prolapse.  Daily fiber supplement recommended  Epic chart review indicates she had a gynecology visit 03/26/2024 (note cannot be reviewed)    Izzah says she had a pelvic ultrasound at that visit because she was describing left lower quadrant pain.  She had seen a gynecologist prior to that and a rectocele was ruled out.  Family history of colon cancer (father, maternal grandmother, maternal aunts  Discussed the use of AI scribe software for clinical note transcription with the patient, who gave verbal consent to proceed.  History of Present Illness DEBHORA TITUS is a 79 year old female with chronic idiopathic constipation and pelvic floor dysfunction who presents for evaluation of abnormal bowel habits and incomplete evacuation.  Abnormal Bowel Habits: - Longstanding abnormal bowel function, with prior evaluations in 2017 and 2022 - Morning bowel movements  are typically regular or skinny - Passage of soft stool in small amounts throughout the day, sometimes unexpectedly during urination - Stool form is variable Dull left lower quadrant pressure comes and goes  Incomplete Rectal Evacuation: - Persistent difficulty achieving complete rectal evacuation - Frequent sensation of incomplete emptying - Occasional pressure and fullness in the left lower abdomen - Residual stool sometimes removed during cleaning, raising concern for retained stool at the end of the rectum  Therapeutic Interventions and Response: - Previous trials of daily fiber supplements, including Metamucil, discontinued due to lack of efficacy - Prior trial of low-dose Linzess in 2017 not recalled as effective - No significant relief from previous interventions  Diagnostic Evaluation: - Referral to gynecology for rectal outlet issues identified an unrelated surgical issue - Vaginal ultrasound in December for pelvic pain was normal, with no structural abnormality related to bowel symptoms - Colonoscopy in summer 2022 was unremarkable, with no polyps or concerning findings - Family history of colon cancer remains a concern, but she has undergone regular screening      ROS:  Review of Systems Denies chest pain dyspnea or dysuria Cough and congestion from her recent sinus infection for which she started azithromycin  a few days ago  Past Medical History: Past Medical History:  Diagnosis Date   Allergy    Arthritis 07/2014   Right hand, prescribed prednisone -no longer taking. also Rt knww   Brain aneurysm    DVT (deep venous thrombosis) (HCC) 2015   very small; treated with compression and heat, no other problems since   GERD (gastroesophageal reflux disease)    patient denies states she does have  hx of peptic ulcer many years   Hepatitis    in 10 th grade  (not sure type- not hep C) - treated  no other problems   History of total right knee replacement     Hyperlipidemia    diet controlled   Memory loss    MVP (mitral valve prolapse)    no problems, MD told patient she could not hear it   OSA (obstructive sleep apnea) 10/08/2021   Osteoarthritis    Osteopenia    [facility]   PONV (postoperative nausea and vomiting)    with ear surgery only - No other problems with other surgeries   Precancerous lesion 12/2014   Treated with cream only   TGA (transient global amnesia)    Varicose veins    Vitamin D  deficiency      Past Surgical History: Past Surgical History:  Procedure Laterality Date   APPENDECTOMY     BUNIONECTOMY WITH HAMMERTOE RECONSTRUCTION Left 04/22/2016   Procedure: MODIFIED MCBRIDE BUNIONECTOMY,  LEFT SECOND HAMMERTOE CORRECTION;  Surgeon: Norleen Armor, MD;  Location: [facility];  Service: Orthopedics;  Laterality: Left;   COLONOSCOPY     07/2012 [facility] - [facility]/father - polyps   colonoscopy with polypectomy  07/2012    hyperplastic;Dr Brodie   DILATION AND CURETTAGE OF UTERUS     DILATION AND CURETTAGE OF UTERUS N/A 09/03/2014   Procedure: DILATATION AND CURETTAGE;  Surgeon: Dasie Gull, MD;  Location: [facility];  Service: Gynecology;  Laterality: N/A;   ENDOVENOUS ABLATION SAPHENOUS VEIN W/ LASER Left 01/31/2014   EVLA  left greater saphenous vein  by Krystal Doing MD   ENDOVENOUS ABLATION SAPHENOUS VEIN W/ LASER Right 03/14/2014   endovenous laser ablation right greater saphenous vein  by Krystal Doing MD   FOOT ARTHRODESIS Left 04/22/2016   Procedure: LEFT FIRST TARSAL METATARSAL ARTHRODESIS;  Surgeon: Norleen Armor, MD;  Location: [facility];  Service: Orthopedics;  Laterality: Left;   G 3 P 3     HYSTEROSCOPY WITH D & C N/A 03/25/2016   Procedure: DILATATION AND CURETTAGE /HYSTEROSCOPY;  Surgeon: Jolene Gaskins, MD;  Location: [facility];  Service: Gynecology;  Laterality: N/A;  WITH ULTRASOUND GUIDANCE   left ear surgery     REPLACEMENT TOTAL KNEE Right 09/15/2022   aluisio   right knee  replacment      may 2024   STAPEDECTOMY     TARSAL METATARSAL FUSION WITH WEIL OSTEOTOMY Left 04/22/2016   Procedure: 2-4 TARSAL METATARSAL FUSION WITH WEIL OSTEOTOMY;  Surgeon: Norleen Armor, MD;  Location: [facility];  Service: Orthopedics;  Laterality: Left;   TUBAL LIGATION       Family History: Family History  Problem Relation Age of Onset   Pancreatic cancer Mother    Miscarriages / Stillbirths Mother    Thyroid  disease Mother        partial thyroidectomy ; S/P RAI   Heart attack Father        in early 28s   Colon cancer Father    Seizures Sister    Ovarian cancer Maternal Aunt    Breast cancer Paternal Aunt    Aneurysm Paternal Uncle        brain   Colon cancer Paternal Grandmother    Stroke Paternal Grandmother        > 65   Heart attack Paternal Grandfather        in 56s   Cancer Other  among paternal sibs (colon, breast, bone)   Diabetes Neg Hx    Stomach cancer Neg Hx    Ulcerative colitis Neg Hx    Esophageal cancer Neg Hx    Rectal cancer Neg Hx     Social History: Social History   Socioeconomic History   Marital status: Married    Spouse name: Christopher   Number of children: Not on file   Years of education: Not on file   Highest education level: Bachelor's degree (e.g., BA, AB, BS)  Occupational History   Occupation: retired  Tobacco Use   Smoking status: Former    Current packs/day: 0.00    Types: Cigarettes    Quit date: 04/27/1963    Years since quitting: 61.0   Smokeless tobacco: Never   Tobacco comments:    Smoked < 6 mos;< 1 pack total;quit 1965  Vaping Use   Vaping status: Never Used  Substance and Sexual Activity   Alcohol use: No   Drug use: No   Sexual activity: Yes    Birth control/protection: Post-menopausal  Other Topics Concern   Not on file  Social History Narrative   Lives with husband   Right Handed   Drinks average 2-3 cups caffeine daily   Retired    Social Drivers of Health   Tobacco Use: Medium  Risk (05/16/2024)   Patient History    Smoking Tobacco Use: Former    Smokeless Tobacco Use: Never    Passive Exposure: Not on Actuary Strain: Low Risk (03/08/2024)   Overall Financial Resource Strain (CARDIA)    Difficulty of Paying Living Expenses: Not hard at all  Food Insecurity: No Food Insecurity (03/29/2024)   Epic    Worried About Radiation Protection Practitioner of Food in the Last Year: Never true    Ran Out of Food in the Last Year: Never true  Transportation Needs: No Transportation Needs (03/29/2024)   Epic    Lack of Transportation (Medical): No    Lack of Transportation (Non-Medical): No  Physical Activity: Inactive (03/29/2024)   Exercise Vital Sign    Days of Exercise per Week: 0 days    Minutes of Exercise per Session: 0 min  Stress: No Stress Concern Present (03/29/2024)   [employer] of Occupational Health - Occupational Stress Questionnaire    Feeling of Stress: Not at all  Social Connections: Socially Integrated (03/29/2024)   Social Connection and Isolation Panel    Frequency of Communication with Friends and Family: More than three times a week    Frequency of Social Gatherings with Friends and Family: More than three times a week    Attends Religious Services: More than 4 times per year    Active Member of Clubs or Organizations: Yes    Attends Banker Meetings: More than 4 times per year    Marital Status: Married  Depression (PHQ2-9): Low Risk (03/29/2024)   Depression (PHQ2-9)    PHQ-2 Score: 0  Alcohol Screen: Not on file  Housing: Unknown (03/29/2024)   Epic    Unable to Pay for Housing in the Last Year: No    Number of Times Moved in the Last Year: Not on file    Homeless in the Last Year: No  Utilities: Not At Risk (03/29/2024)   Epic    Threatened with loss of utilities: No  Health Literacy: Adequate Health Literacy (03/29/2024)   B1300 Health Literacy    Frequency of need for help with medical instructions: Never  Allergies: Allergies[1]  Outpatient Meds: Current Outpatient Medications  Medication Sig Dispense Refill   aspirin  EC 81 MG tablet Take 1 tablet (81 mg total) by mouth daily. Swallow whole. 30 tablet 12   atorvastatin  (LIPITOR) 40 MG tablet Take 1 tablet (40 mg total) by mouth daily. 90 tablet 1   augmented betamethasone  dipropionate (DIPROLENE -AF) 0.05 % ointment Apply topically.     azithromycin  (ZITHROMAX ) 250 MG tablet Take 2 tablets on day 1, then 1 tablet daily on days 2 through 5 6 tablet 0   Boswellia-Glucosamine-Vit D (OSTEO BI-FLEX ONE PER DAY PO) Take 1 tablet by mouth 2 (two) times daily.     cetirizine (ZYRTEC) 10 MG tablet Take 10 mg by mouth daily as needed for allergies.     Cyanocobalamin  (B-12 PO) Take by mouth.     fluticasone  (FLONASE ) 50 MCG/ACT nasal spray Place 2 sprays into both nostrils daily. 16 g 6   lidocaine  (LIDODERM ) 5 % Place 1 patch onto the skin daily. Remove & Discard patch within 12 hours or as directed by MD 14 patch 0   MAGNESIUM PO Take 1 capsule by mouth daily.     methocarbamol  (ROBAXIN ) 500 MG tablet Take 1 tablet (500 mg total) by mouth 2 (two) times daily. 20 tablet 0   Multiple Vitamins-Minerals (MULTIVITAMIN ADULTS 50+) TABS Take 1 tablet by mouth daily.     Multiple Vitamins-Minerals (ZINC PO) Take by mouth.     Omega-3 Fatty Acids (FISH OIL PO) Take 1 capsule by mouth daily.     promethazine -dextromethorphan (PROMETHAZINE -DM) 6.25-15 MG/5ML syrup Take 5 mLs by mouth 4 (four) times daily as needed. 118 mL 0   triamcinolone  (NASACORT ) 55 MCG/ACT AERO nasal inhaler Place 2 sprays into the nose daily as needed (allergies).      triamcinolone  cream (KENALOG ) 0.1 % Apply 1 Application topically 2 (two) times daily as needed (eczema.). 30 g 1   Ubiquinol 100 MG CAPS Take by oral route.     UNABLE TO FIND Take 1 tablet by mouth 3 (three) times daily. Med Name: bone strength- plant based calcium      No current facility-administered medications  for this visit.      ___________________________________________________________________ Objective   Exam:  BP 136/86   Pulse 68   Ht 5' 2 (1.575 m)   Wt 145 lb (65.8 kg)   SpO2 94%   BMI 26.52 kg/m  Wt Readings from Last 3 Encounters:  05/16/24 145 lb (65.8 kg)  05/14/24 146 lb 6.4 oz (66.4 kg)  03/29/24 150 lb (68 kg)   Breathing comfortably on room air, no respiratory distress or dysphonia  CV: Regular without appreciable murmur, no JVD, no peripheral edema Resp: clear to auscultation bilaterally, normal RR and effort noted.  No wheezing GI: soft, no tenderness, with active bowel sounds. No guarding or palpable organomegaly noted. Skin; warm and dry, no rash or jaundice noted Neuro: awake, alert and oriented x 3. Normal gross motor function and fluent speech     Encounter Diagnoses  Name Primary?   Change in bowel habits Yes   LLQ abdominal pain     Assessment & Plan Chronic constipation with pelvic floor dysfunction Chronic constipation due to slow colonic transit and pelvic floor dysfunction, no structural abnormality or malignancy.  Reportedly no rectocele and evaluated by gynecology.   Normal colonoscopy, low colorectal malignancy risk. - Recommended daily fiber supplementation. - Advised regular postprandial toileting schedule. - Advised against excessive stool softeners to prevent incontinence. -  Reviewed Linaclotide trial, advised against further use due likelihood of developing diarrhea continued incontinence.  She also seems to recall it had not been helpful.  No further testing scheduled at this point, see me as needed   Thank you for the courtesy of this consult.  Please call me with any questions or concerns.  Victory LITTIE Brand III  CC: Referring provider noted above     [1]  Allergies Allergen Reactions   Macrobid   [Nitrofurantoin  Macrocrystal] Other (See Comments)   Doxycycline Hives   Pravastatin Sodium     REACTION: shin pain    Amoxicillin-Pot Clavulanate     GI symptoms; abdominal pain & nausea NOTE: able to take Ampicillin   Augmentin [Amoxicillin-Pot Clavulanate] Nausea Only    Upsets stomach   Codeine Nausea Only   Methylphenidate Hcl Palpitations   Methylprednisolone  Rash    Rash and redness on face   Sulfamethoxazole-Trimethoprim Nausea Only     nausea   "

## 2024-05-16 NOTE — Patient Instructions (Signed)
 _______________________________________________________  If your blood pressure at your visit was 140/90 or greater, please contact your primary care physician to follow up on this.  _______________________________________________________  If you are age 79 or older, your body mass index should be between 23-30. Your Body mass index is 26.52 kg/m. If this is out of the aforementioned range listed, please consider follow up with your Primary Care Provider.  If you are age 19 or younger, your body mass index should be between 19-25. Your Body mass index is 26.52 kg/m. If this is out of the aformentioned range listed, please consider follow up with your Primary Care Provider.   ________________________________________________________  The [facility] providers would like to encourage you to use MYCHART to communicate with providers for non-urgent requests or questions.  Due to long hold times on the telephone, sending your provider a message by [facility] may be a faster and more efficient way to get a response.  Please allow 48 business hours for a response.  Please remember that this is for non-urgent requests.  _______________________________________________________  [facility] is using a team-based approach to care.  Your team is made up of your doctor and two to three APPS. Our APPS (Nurse Practitioners and Physician Assistants) work with your physician to ensure care continuity for you. They are fully qualified to address your health concerns and develop a treatment plan. They communicate directly with your gastroenterologist to care for you. Seeing the Advanced Practice Practitioners on your physician's team can help you by facilitating care more promptly, often allowing for earlier appointments, access to diagnostic testing, procedures, and other specialty referrals.    Thank you for trusting me with your gastrointestinal care!    Dr. Victory Legrand DOUGLAS [facility]

## 2025-04-04 ENCOUNTER — Ambulatory Visit
